# Patient Record
Sex: Female | Born: 1960 | Race: White | Hispanic: No | Smoking: Current every day smoker
Health system: Southern US, Community
[De-identification: ages and names within clinical notes are randomized; demographics above are authoritative.]

## PROBLEM LIST (undated history)

## (undated) DIAGNOSIS — I10 Essential (primary) hypertension: Secondary | ICD-10-CM

## (undated) DIAGNOSIS — E119 Type 2 diabetes mellitus without complications: Secondary | ICD-10-CM

## (undated) DIAGNOSIS — J45909 Unspecified asthma, uncomplicated: Secondary | ICD-10-CM

## (undated) DIAGNOSIS — F32A Depression, unspecified: Secondary | ICD-10-CM

## (undated) DIAGNOSIS — L280 Lichen simplex chronicus: Secondary | ICD-10-CM

## (undated) DIAGNOSIS — F1910 Other psychoactive substance abuse, uncomplicated: Secondary | ICD-10-CM

## (undated) DIAGNOSIS — E669 Obesity, unspecified: Secondary | ICD-10-CM

## (undated) DIAGNOSIS — K602 Anal fissure, unspecified: Secondary | ICD-10-CM

## (undated) DIAGNOSIS — F329 Major depressive disorder, single episode, unspecified: Secondary | ICD-10-CM

## (undated) HISTORY — DX: Major depressive disorder, single episode, unspecified: F32.9

## (undated) HISTORY — DX: Type 2 diabetes mellitus without complications: E11.9

## (undated) HISTORY — PX: TOTAL VAGINAL HYSTERECTOMY: SHX2548

## (undated) HISTORY — DX: Obesity, unspecified: E66.9

## (undated) HISTORY — DX: Depression, unspecified: F32.A

## (undated) HISTORY — DX: Essential (primary) hypertension: I10

## (undated) HISTORY — PX: MENISCUS REPAIR: SHX5179

## (undated) HISTORY — DX: Unspecified asthma, uncomplicated: J45.909

## (undated) HISTORY — DX: Anal fissure, unspecified: K60.2

## (undated) HISTORY — PX: CHOLECYSTECTOMY: SHX55

## (undated) HISTORY — PX: BACK SURGERY: SHX140

## (undated) HISTORY — DX: Other psychoactive substance abuse, uncomplicated: F19.10

## (undated) HISTORY — PX: TONSILLECTOMY: SUR1361

## (undated) HISTORY — DX: Lichen simplex chronicus: L28.0

---

## 1988-07-04 HISTORY — PX: CYSTOSTOMY W/ BLADDER BIOPSY: SHX1431

## 1998-02-12 ENCOUNTER — Other Ambulatory Visit: Admission: RE | Admit: 1998-02-12 | Discharge: 1998-02-12 | Payer: Self-pay | Admitting: Family Medicine

## 1999-08-11 ENCOUNTER — Other Ambulatory Visit: Admission: RE | Admit: 1999-08-11 | Discharge: 1999-08-11 | Payer: Self-pay | Admitting: Family Medicine

## 1999-09-25 ENCOUNTER — Emergency Department (HOSPITAL_COMMUNITY): Admission: EM | Admit: 1999-09-25 | Discharge: 1999-09-25 | Payer: Self-pay | Admitting: Emergency Medicine

## 1999-09-25 ENCOUNTER — Encounter: Payer: Self-pay | Admitting: Emergency Medicine

## 2000-07-17 ENCOUNTER — Other Ambulatory Visit (HOSPITAL_COMMUNITY): Admission: RE | Admit: 2000-07-17 | Discharge: 2000-08-07 | Payer: Self-pay | Admitting: Psychiatry

## 2002-12-11 ENCOUNTER — Encounter: Admission: RE | Admit: 2002-12-11 | Discharge: 2002-12-11 | Payer: Self-pay | Admitting: Neurology

## 2002-12-11 ENCOUNTER — Encounter: Payer: Self-pay | Admitting: Neurology

## 2003-01-30 ENCOUNTER — Encounter: Payer: Self-pay | Admitting: Family Medicine

## 2003-01-30 ENCOUNTER — Ambulatory Visit (HOSPITAL_COMMUNITY): Admission: RE | Admit: 2003-01-30 | Discharge: 2003-01-30 | Payer: Self-pay | Admitting: Family Medicine

## 2003-10-24 ENCOUNTER — Other Ambulatory Visit: Admission: RE | Admit: 2003-10-24 | Discharge: 2003-10-24 | Payer: Self-pay | Admitting: Family Medicine

## 2004-10-27 ENCOUNTER — Other Ambulatory Visit: Admission: RE | Admit: 2004-10-27 | Discharge: 2004-10-27 | Payer: Self-pay | Admitting: Family Medicine

## 2006-11-23 ENCOUNTER — Ambulatory Visit (HOSPITAL_COMMUNITY): Admission: RE | Admit: 2006-11-23 | Discharge: 2006-11-23 | Payer: Self-pay | Admitting: Family Medicine

## 2009-04-22 ENCOUNTER — Ambulatory Visit (HOSPITAL_COMMUNITY): Admission: RE | Admit: 2009-04-22 | Discharge: 2009-04-22 | Payer: Self-pay | Admitting: Family Medicine

## 2010-04-13 ENCOUNTER — Encounter: Admission: RE | Admit: 2010-04-13 | Discharge: 2010-04-13 | Payer: Self-pay | Admitting: Family Medicine

## 2010-10-28 ENCOUNTER — Other Ambulatory Visit: Payer: Self-pay | Admitting: Family Medicine

## 2010-10-28 DIAGNOSIS — K769 Liver disease, unspecified: Secondary | ICD-10-CM

## 2010-11-03 ENCOUNTER — Ambulatory Visit
Admission: RE | Admit: 2010-11-03 | Discharge: 2010-11-03 | Disposition: A | Discharge status: Home / Self Care | Payer: BC Managed Care – PPO | Source: Ambulatory Visit | Attending: Family Medicine | Admitting: Family Medicine

## 2010-11-03 DIAGNOSIS — K769 Liver disease, unspecified: Secondary | ICD-10-CM

## 2010-11-03 MED ORDER — IOHEXOL 300 MG/ML  SOLN: 125.0000 mL | Freq: Once | INTRAMUSCULAR | Status: AC | PRN

## 2012-04-17 ENCOUNTER — Other Ambulatory Visit (HOSPITAL_COMMUNITY): Payer: Self-pay | Admitting: Nurse Practitioner

## 2012-04-17 DIAGNOSIS — Z139 Encounter for screening, unspecified: Secondary | ICD-10-CM

## 2012-04-23 ENCOUNTER — Ambulatory Visit (HOSPITAL_COMMUNITY)
Admission: RE | Admit: 2012-04-23 | Discharge: 2012-04-23 | Disposition: A | Discharge status: Home / Self Care | Payer: Self-pay | Source: Ambulatory Visit | Attending: Nurse Practitioner | Admitting: Nurse Practitioner

## 2012-04-23 DIAGNOSIS — Z139 Encounter for screening, unspecified: Secondary | ICD-10-CM

## 2012-05-17 ENCOUNTER — Encounter (HOSPITAL_COMMUNITY): Payer: Self-pay | Admitting: Dietician

## 2012-05-17 NOTE — Progress Notes (Signed)
[facility] Diabetes Class Completion  Date:May 17, 2012  Time: 6:30 PM  Pt attended [facility]'s Diabetes Group Education Class on May 17, 2012.   Patient was educated on the following topics: survival skills (signs and symptoms of hyperglycemia and hypoglycemia, treatment for hypoglycemia, ideal levels for fasting and postprandial blood sugars, goal Hgb A1c level, foot care basics), recommendations for physical activity, carbohydrate metabolism in relation to diabetes, and meal planning (sources of carbohydrate, carbohydrate counting, meal planning strategies, food label reading, and portion control).   Rusty Villella A. Kayan, RD, LDN   

## 2012-10-18 ENCOUNTER — Ambulatory Visit (INDEPENDENT_AMBULATORY_CARE_PROVIDER_SITE_OTHER): Payer: BC Managed Care – PPO | Admitting: Family Medicine

## 2012-10-18 ENCOUNTER — Encounter: Payer: Self-pay | Admitting: Family Medicine

## 2012-10-18 VITALS — BP 125/87 | HR 97 | Temp 97.0°F | Ht 66.0 in | Wt 209.0 lb

## 2012-10-18 DIAGNOSIS — E785 Hyperlipidemia, unspecified: Secondary | ICD-10-CM

## 2012-10-18 DIAGNOSIS — I10 Essential (primary) hypertension: Secondary | ICD-10-CM

## 2012-10-18 DIAGNOSIS — E119 Type 2 diabetes mellitus without complications: Secondary | ICD-10-CM

## 2012-10-18 DIAGNOSIS — R1319 Other dysphagia: Secondary | ICD-10-CM

## 2012-10-18 LAB — POCT UA - MICROALBUMIN: Microalbumin Ur, POC: NEGATIVE mg/dL

## 2012-10-18 LAB — POCT GLYCOSYLATED HEMOGLOBIN (HGB A1C): Hemoglobin A1C: 5.7

## 2012-10-18 NOTE — Progress Notes (Signed)
Patient ID: Nancy Thornton, female   DOB: 1961/02/11, 52 y.o.   MRN: [id number] SUBJECTIVE: HPI: Patient is here for follow up of Diabetes Mellitus.Symptoms of DM:has had no Nocturia ,deniesUrinary Frequency ,denies Blurred vision ,deniesDizziness,denies.Dysuria,deniesparesthesias, deniesextremity pain or ulcers. Marland Kitchendenieschest pain. .has not hadan annual eye exam. do check the feet. doescheck CBGs. Average CBG:________.Marland Kitchen deniesto episodes of hypoglycemia. doeshave an emergency hypoglycemic plan. admits toCompliance with medications. deniesProblems with medications. Patient used to go to [facility] for care because of lack of insurance. Noew enrolled under the [employer] and has insurance she returns for ongoing care. Since her visit in 2012 she has aggressively dieted and lost over 60 lbs and helped improve her diet. She still smokes and  Hypertension: stable. See meds in EPIC.  Anther new problem is that whenever she eats or drinks liquids, she feels it flow to the right side of the neck before going down and it doesn't feel normal. No real pain. No night sweats.  PMH/PSH: reviewed/updated in Epic  SH/FH: reviewed/updated in Epic  Allergies: reviewed/updated in Epic  Medications: reviewed/updated in Epic  Immunizations: reviewed/updated in Epic  ROS: As above in the HPI. All other systems are stable or negative.  OBJECTIVE: APPEARANCE: WF OBese. Patient in no acute distress.The patient appeared well nourished and normally developed. Acyanotic. Waist: VITAL SIGNS:BP 125/87  Pulse 97  Temp(Src) 97 F (36.1 C) (Oral)  Ht 5\' 6"  (1.676 m)  Wt 209 lb (94.802 kg)  BMI 33.75 kg/m2   SKIN: warm and  Dry without overt rashes, tattoos and scars  HEAD and Neck: without JVD, Head and scalp: normal Eyes:No scleral icterus. Fundi normal, eye movements normal. Ears: Auricle normal, canal normal, Tympanic membranes normal, insufflation normal. Nose: normal Throat: normal Neck &  thyroid: normal  CHEST & LUNGS: Chest wall: normal Lungs: Clear  CVS: Reveals the PMI to be normally located. Regular rhythm, First and Second Heart sounds are normal,  absence of murmurs, rubs or gallops. Peripheral vasculature: Radial pulses: normal Dorsal pedis pulses: normal Posterior pulses: normal  ABDOMEN:  Appearance: normal Benign,, no organomegaly, no masses, no Abdominal Aortic enlargement. No Guarding , no rebound. No Bruits. Bowel sounds: normal  RECTAL: N/A GU: N/A  EXTREMETIES: nonedematous. Both Femoral and Pedal pulses are normal.  MUSCULOSKELETAL:  Spine: normal Joints: intact  NEUROLOGIC: oriented to time,place and person; nonfocal. Strength is normal Sensory is normal Reflexes are normal Cranial Nerves are normal.  ASSESSMENT: HTN (hypertension) - Plan: BASIC METABOLIC PANEL WITH GFR, NMR Lipoprofile with Lipids  Other dysphagia - Plan: Ambulatory referral to Gastroenterology  DM (diabetes mellitus) - Plan: Hepatic function panel, NMR Lipoprofile with Lipids, POCT glycosylated hemoglobin (Hb A1C), POCT UA - Microalbumin  HLD (hyperlipidemia) - Plan: Hepatic function panel, NMR Lipoprofile with Lipids    PLAN: Orders Placed This Encounter  Procedures  . BASIC METABOLIC PANEL WITH GFR  . Hepatic function panel  . NMR Lipoprofile with Lipids  . Ambulatory referral to Gastroenterology    Referral Priority:  Routine    Referral Type:  Consultation    Referral Reason:  Specialty Services Required    Requested Specialty:  Gastroenterology    Number of Visits Requested:  1  . POCT glycosylated hemoglobin (Hb A1C)  . POCT UA - Microalbumin   Results for orders placed in visit on 10/18/12 (from the past 24 hour(s))  POCT UA - MICROALBUMIN     Status: Normal   Collection Time    10/18/12  5:32 PM  Result Value Range   Microalbumin Ur, POC neg     Creatinine, POC       Albumin/Creatinine Ratio, Urine, POC      POCT GLYCOSYLATED  HEMOGLOBIN (HGB A1C)     Status: None   Collection Time    10/18/12  5:38 PM      Result Value Range   Hemoglobin A1C 5.7     Meds ordered this encounter  Medications  . lisinopril (PRINIVIL,ZESTRIL) 20 MG tablet    Sig: Take 20 mg by mouth daily.  Marland Kitchen atenolol-chlorthalidone (TENORETIC) 50-25 MG per tablet    Sig: Take 1 tablet by mouth daily.  . metFORMIN (GLUCOPHAGE) 500 MG tablet    Sig: Take 500 mg by mouth 2 (two) times daily with a meal.        Dr Woodroe Mode Recommendations  Diet and Exercise discussed with patient.  For nutrition information, I recommend books:  1).Eat to Live by Dr Monico Hoar. 2).Prevent and Reverse Heart Disease by Dr Suzzette Righter.  Exercise recommendations are:  If unable to walk, then the patient can exercise in a chair 3 times a day. By flapping arms like a bird gently and raising legs outwards to the front.  If ambulatory, the patient can go for walks for 30 minutes 3 times a week. Then increase the intensity and duration as tolerated.  Goal is to try to attain exercise frequency to 5 times a week.  If applicable: Best to perform resistance exercises (machines or weights) 2 days a week and cardio type exercises 3 days per week.  RTC in 3 months  [name] P. Modesto Charon, M.D.

## 2012-10-18 NOTE — Patient Instructions (Addendum)
      Dr Leondre Taul's Recommendations  Diet and Exercise discussed with patient.  For nutrition information, I recommend books:  1).Eat to Live by Dr Joel Fuhrman. 2).Prevent and Reverse Heart Disease by Dr Caldwell Esselstyn.  Exercise recommendations are:  If unable to walk, then the patient can exercise in a chair 3 times a day. By flapping arms like a bird gently and raising legs outwards to the front.  If ambulatory, the patient can go for walks for 30 minutes 3 times a week. Then increase the intensity and duration as tolerated.  Goal is to try to attain exercise frequency to 5 times a week.  If applicable: Best to perform resistance exercises (machines or weights) 2 days a week and cardio type exercises 3 days per week.  

## 2012-10-19 DIAGNOSIS — E1159 Type 2 diabetes mellitus with other circulatory complications: Secondary | ICD-10-CM | POA: Insufficient documentation

## 2012-10-19 DIAGNOSIS — E785 Hyperlipidemia, unspecified: Secondary | ICD-10-CM | POA: Insufficient documentation

## 2012-10-19 DIAGNOSIS — I152 Hypertension secondary to endocrine disorders: Secondary | ICD-10-CM | POA: Insufficient documentation

## 2012-10-19 DIAGNOSIS — E1165 Type 2 diabetes mellitus with hyperglycemia: Secondary | ICD-10-CM | POA: Insufficient documentation

## 2012-10-19 DIAGNOSIS — I10 Essential (primary) hypertension: Secondary | ICD-10-CM | POA: Insufficient documentation

## 2012-10-19 DIAGNOSIS — E119 Type 2 diabetes mellitus without complications: Secondary | ICD-10-CM

## 2012-10-19 DIAGNOSIS — E1169 Type 2 diabetes mellitus with other specified complication: Secondary | ICD-10-CM | POA: Insufficient documentation

## 2012-10-19 DIAGNOSIS — R1319 Other dysphagia: Secondary | ICD-10-CM | POA: Insufficient documentation

## 2012-10-19 HISTORY — DX: Type 2 diabetes mellitus without complications: E11.9

## 2012-10-19 LAB — HEPATIC FUNCTION PANEL
ALT: 21 U/L (ref 0–35)
AST: 23 U/L (ref 0–37)
Albumin: 5 g/dL (ref 3.5–5.2)
Alkaline Phosphatase: 59 U/L (ref 39–117)
Bilirubin, Direct: 0.2 mg/dL (ref 0.0–0.3)
Indirect Bilirubin: 0.8 mg/dL (ref 0.0–0.9)
Total Bilirubin: 1 mg/dL (ref 0.3–1.2)
Total Protein: 6.9 g/dL (ref 6.0–8.3)

## 2012-10-19 LAB — NMR LIPOPROFILE WITH LIPIDS
Cholesterol, Total: 181 mg/dL (ref <200)
HDL Particle Number: 37.6 umol/L (ref >=30.5)
HDL Size: 9.1 nm — ABNORMAL LOW (ref >=9.2)
HDL-C: 52 mg/dL (ref >=40)
LDL (calc): 84 mg/dL (ref <100)
LDL Particle Number: 1355 nmol/L — ABNORMAL HIGH (ref <1000)
LDL Size: 20.7 nm (ref >20.5)
LP-IR Score: 56 — ABNORMAL HIGH (ref <=45)
Large HDL-P: 7.5 umol/L (ref >=4.8)
Large VLDL-P: 8.8 nmol/L — ABNORMAL HIGH (ref <=2.7)
Small LDL Particle Number: 592 nmol/L — ABNORMAL HIGH (ref <=527)
Triglycerides: 226 mg/dL — ABNORMAL HIGH (ref <150)
VLDL Size: 49.5 nm — ABNORMAL HIGH (ref <=46.6)

## 2012-10-19 LAB — BASIC METABOLIC PANEL WITH GFR
BUN: 17 mg/dL (ref 6–23)
CO2: 33 mEq/L — ABNORMAL HIGH (ref 19–32)
Calcium: 10.7 mg/dL — ABNORMAL HIGH (ref 8.4–10.5)
Chloride: 98 mEq/L (ref 96–112)
Creat: 0.69 mg/dL (ref 0.50–1.10)
GFR, Est African American: >89 mL/min
GFR, Est Non African American: >89 mL/min
Glucose, Bld: 92 mg/dL (ref 70–99)
Potassium: 4.2 mEq/L (ref 3.5–5.3)
Sodium: 138 mEq/L (ref 135–145)

## 2012-10-19 NOTE — Progress Notes (Signed)
Quick Note:  Lab result at goal. No change in Medications for now. No Change in plans and follow up. Lipids not quite at goal. But continued Diet and Exercise and weight loss will improve the numbers. Keep the follow up. ______

## 2012-10-23 ENCOUNTER — Encounter: Payer: Self-pay | Admitting: Gastroenterology

## 2012-10-24 ENCOUNTER — Telehealth: Payer: Self-pay

## 2012-10-24 NOTE — Telephone Encounter (Signed)
Returning call.

## 2012-11-14 ENCOUNTER — Encounter: Payer: Self-pay | Admitting: Gastroenterology

## 2012-11-14 ENCOUNTER — Ambulatory Visit (INDEPENDENT_AMBULATORY_CARE_PROVIDER_SITE_OTHER): Payer: BC Managed Care – PPO | Admitting: Gastroenterology

## 2012-11-14 ENCOUNTER — Telehealth: Payer: Self-pay | Admitting: Gastroenterology

## 2012-11-14 VITALS — BP 100/70 | HR 48 | Ht 64.76 in | Wt 204.1 lb

## 2012-11-14 DIAGNOSIS — Z1211 Encounter for screening for malignant neoplasm of colon: Secondary | ICD-10-CM

## 2012-11-14 DIAGNOSIS — R1319 Other dysphagia: Secondary | ICD-10-CM

## 2012-11-14 MED ORDER — PEG-KCL-NACL-NASULF-NA ASC-C 100 G PO SOLR: 1.0000 | Freq: Once | ORAL | Status: DC

## 2012-11-14 NOTE — Progress Notes (Signed)
History of Present Illness: pleasant 52 year old white female referred at the request of Dr. Christell Constant for evaluation of swallowing difficulties and for screening colonoscopy. She has occasional dysphagia to solids. He denies odynophagia or pyrosis.  She also sometimes gets a choking sensation with swallowing. There is no history of abdominal pain, change of bowel habits, melena or hematochezia.  She has lost almost 60 pounds in the past year which she attributes to increased physical activity and some modification of her diet.    Past Medical History  Diagnosis Date  . Diabetes mellitus without complication   . Hypertension   . Anal fissure     history of  . Obesity    Past Surgical History  Procedure Laterality Date  . Cholecystectomy    . Abdominal hysterectomy    . Tonsillectomy    . Cystostomy w/ bladder biopsy  1990    punctured hole in bladder, had open procedure to repair   family history includes Diabetes in her mother and Hypertension in her father. Current Outpatient Prescriptions  Medication Sig Dispense Refill  . atenolol-chlorthalidone (TENORETIC) 50-25 MG per tablet Take 1 tablet by mouth daily.      Marland Kitchen lisinopril (PRINIVIL,ZESTRIL) 20 MG tablet Take 20 mg by mouth daily.      . metFORMIN (GLUCOPHAGE) 500 MG tablet Take 500 mg by mouth 2 (two) times daily with a meal.       No current facility-administered medications for this visit.   Allergies as of 11/14/2012 - Review Complete 11/14/2012  Allergen Reaction Noted  . Penicillins  10/18/2012    reports that she has been smoking Cigarettes.  She started smoking about 35 years ago. She has been smoking about 0.00 packs per day. She has never used smokeless tobacco. She reports that she does not drink alcohol or use illicit drugs.     Review of Systems: Pertinent positive and negative review of systems were noted in the above HPI section. All other review of systems were otherwise negative.  Vital signs were reviewed in  today's medical record Physical Exam: General: Well developed , well nourished, no acute distress Skin: anicteric Head: Normocephalic and atraumatic Eyes:  sclerae anicteric, EOMI Ears: Normal auditory acuity Mouth: No deformity or lesions Neck: Supple, no masses or thyromegaly Lungs: review inspiratory and expiratory wheezes Heart: Regular rate and rhythm; no murmurs, rubs or bruits Abdomen: Soft, non tender and non distended. No masses, hepatosplenomegaly or hernias noted. Normal Bowel sounds Rectal:deferred Musculoskeletal: Symmetrical with no gross deformities  Skin: No lesions on visible extremities Pulses:  Normal pulses noted Extremities: No clubbing, cyanosis, edema or deformities noted Neurological: Alert oriented x 4, grossly nonfocal Cervical Nodes:  No significant cervical adenopathy Inguinal Nodes: No significant inguinal adenopathy Psychological:  Alert and cooperative. Normal mood and affect

## 2012-11-14 NOTE — Assessment & Plan Note (Signed)
Dysphagia could be do to an early esophageal stricture  Recommendations #1 upper endoscopy with dilatation as indicated

## 2012-11-14 NOTE — Assessment & Plan Note (Signed)
Plan screening colonoscopy 

## 2012-11-14 NOTE — Telephone Encounter (Signed)
Called pt to pick up free coupon for the Moviprep

## 2012-11-14 NOTE — Patient Instructions (Addendum)
You have been scheduled for an endoscopy with propofol. Please follow written instructions given to you at your visit today. If you use inhalers (even only as needed), please bring them with you on the day of your procedure. Your physician has requested that you go to www.startemmi.com and enter the access code given to you at your visit today. This web site gives a general overview about your procedure. However, you should still follow specific instructions given to you by our office regarding your preparation for the procedure.  You have been scheduled for a colonoscopy with propofol. Please follow written instructions given to you at your visit today.  Please pick up your prep kit at the pharmacy within the next 1-3 days. If you use inhalers (even only as needed), please bring them with you on the day of your procedure. Your physician has requested that you go to www.startemmi.com and enter the access code given to you at your visit today. This web site gives a general overview about your procedure. However, you should still follow specific instructions given to you by our office regarding your preparation for the procedure. 

## 2012-11-19 ENCOUNTER — Other Ambulatory Visit: Payer: Self-pay | Admitting: Gastroenterology

## 2012-11-19 ENCOUNTER — Encounter: Payer: Self-pay | Admitting: Gastroenterology

## 2012-11-19 ENCOUNTER — Ambulatory Visit (AMBULATORY_SURGERY_CENTER): Payer: BC Managed Care – PPO | Admitting: Gastroenterology

## 2012-11-19 VITALS — BP 115/74 | HR 51 | Temp 96.4°F | Resp 20 | Ht 64.76 in | Wt 204.0 lb

## 2012-11-19 DIAGNOSIS — R1319 Other dysphagia: Secondary | ICD-10-CM

## 2012-11-19 DIAGNOSIS — K222 Esophageal obstruction: Secondary | ICD-10-CM

## 2012-11-19 LAB — GLUCOSE, CAPILLARY
Glucose-Capillary: 119 mg/dL — ABNORMAL HIGH (ref 70–99)
Glucose-Capillary: 126 mg/dL — ABNORMAL HIGH (ref 70–99)

## 2012-11-19 MED ORDER — SODIUM CHLORIDE 0.9 % IV SOLN: 500.0000 mL | INTRAVENOUS | Status: DC

## 2012-11-19 NOTE — Progress Notes (Signed)
Patient did not experience any of the following events: a burn prior to discharge; a fall within the facility; wrong site/side/patient/procedure/implant event; or a hospital transfer or hospital admission upon discharge from the facility. (G8907) Patient did not have preoperative order for IV antibiotic SSI prophylaxis. (G8918)  

## 2012-11-19 NOTE — Patient Instructions (Signed)
Dilatation of esophagus today, follow dilation diet. Nothing by mouth until 10:20 am, then clear liquids for 1 hour, then soft diet rest of today. Resume regular diet tomorrow. See handouts given on dilation diet and stricture. Repeat dilation as needed. Resume current medications. Call us with any questions or concerns. Thank you!!   YOU HAD AN ENDOSCOPIC PROCEDURE TODAY AT THE [facility]: Refer to the procedure report that was given to you for any specific questions about what was found during the examination.  If the procedure report does not answer your questions, please call your gastroenterologist to clarify.  If you requested that your care partner not be given the details of your procedure findings, then the procedure report has been included in a sealed envelope for you to review at your convenience later.  YOU SHOULD EXPECT: Some feelings of bloating in the abdomen. Passage of more gas than usual.  Walking can help get rid of the air that was put into your GI tract during the procedure and reduce the bloating. If you had a lower endoscopy (such as a colonoscopy or flexible sigmoidoscopy) you may notice spotting of blood in your stool or on the toilet paper. If you underwent a bowel prep for your procedure, then you may not have a normal bowel movement for a few days.  DIET: dilation diet today, see handout.  Drink plenty of fluids but you should avoid alcoholic beverages for 24 hours.  ACTIVITY: Your care partner should take you home directly after the procedure.  You should plan to take it easy, moving slowly for the rest of the day.  You can resume normal activity the day after the procedure however you should NOT DRIVE or use heavy machinery for 24 hours (because of the sedation medicines used during the test).    SYMPTOMS TO REPORT IMMEDIATELY: A gastroenterologist can be reached at any hour.  During normal business hours, 8:30 AM to 5:00 PM Monday through Friday, call [phone]  [phone].  After hours and on weekends, please call the GI answering service at [phone] who will take a message and have the physician on call contact you.   Following lower endoscopy (colonoscopy or flexible sigmoidoscopy):  Excessive amounts of blood in the stool  Significant tenderness or worsening of abdominal pains  Swelling of the abdomen that is new, acute  Fever of 100F or higher  Following upper endoscopy (EGD)  Vomiting of blood or coffee ground material  New chest pain or pain under the shoulder blades  Painful or persistently difficult swallowing  New shortness of breath  Fever of 100F or higher  Black, tarry-looking stools  FOLLOW UP: If any biopsies were taken you will be contacted by phone or by letter within the next 1-3 weeks.  Call your gastroenterologist if you have not heard about the biopsies in 3 weeks.  Our staff will call the home number listed on your records the next business day following your procedure to check on you and address any questions or concerns that you may have at that time regarding the information given to you following your procedure. This is a courtesy call and so if there is no answer at the home number and we have not heard from you through the emergency physician on call, we will assume that you have returned to your regular daily activities without incident.  SIGNATURES/CONFIDENTIALITY: You and/or your care partner have signed paperwork which will be entered into your electronic medical record.  These  signatures attest to the fact that that the information above on your After Visit Summary has been reviewed and is understood.  Full responsibility of the confidentiality of this discharge information lies with you and/or your care-partner. 

## 2012-11-19 NOTE — Progress Notes (Signed)
Stable to RR 

## 2012-11-19 NOTE — Progress Notes (Signed)
Called to room to assist during endoscopic procedure.  Patient ID and intended procedure confirmed with present staff. Received instructions for my participation in the procedure from the performing physician.  

## 2012-11-19 NOTE — Op Note (Signed)
[facility] [address] [place], [address]   ENDOSCOPY PROCEDURE REPORT  PATIENT: Nancy Thornton, Nancy Thornton  MR#: [id number] BIRTHDATE: 1960-10-20 , 51  yrs. old GENDER: Female ENDOSCOPIST: Louis Meckel, MD REFERRED BY:  Rudi Heap, M.D. PROCEDURE DATE:  11/19/2012 PROCEDURE:  EGD, diagnostic and Maloney dilation of esophagus ASA CLASS:     Class II INDICATIONS:  Dysphagia. MEDICATIONS: MAC sedation, administered by CRNA, propofol (Diprivan) 100mg  IV, and Simethicone 0.6cc PO TOPICAL ANESTHETIC:  DESCRIPTION OF PROCEDURE: After the risks benefits and alternatives of the procedure were thoroughly explained, informed consent was obtained.  The LB [id number] endoscope was introduced through the mouth and advanced to the third portion of the duodenum. Without limitations.  The instrument was slowly withdrawn as the mucosa was fully examined.      There was a moderate stricture at the GE junction.  The 9.8 mm gastroscope easily traversed the stricture.  A small hiatal hernia was present.   The remainder of the upper endoscopy exam was otherwise normal.  Retroflexed views revealed no abnormalities. The scope was then withdrawn from the patient.  A #52 Jerene Dilling dilator was passed with moderate resistance. There was no heme.  COMPLICATIONS: There were no complications. ENDOSCOPIC IMPRESSION: 1.   esophageal stricture-status post [facility] dilation  RECOMMENDATIONS: repeat dilatation as needed REPEAT EXAM:  eSigned:  Louis Meckel, MD 11/19/2012 9:21 AM   CC:

## 2012-11-20 ENCOUNTER — Telehealth: Payer: Self-pay

## 2012-11-20 NOTE — Telephone Encounter (Signed)
Left message for follow up call. 

## 2012-11-21 ENCOUNTER — Other Ambulatory Visit: Payer: Self-pay | Admitting: *Deleted

## 2012-11-21 MED ORDER — METFORMIN HCL 500 MG PO TABS: 500.0000 mg | ORAL_TABLET | Freq: Two times a day (BID) | ORAL | Status: DC

## 2012-12-04 ENCOUNTER — Other Ambulatory Visit: Payer: Self-pay

## 2012-12-04 MED ORDER — ATENOLOL-CHLORTHALIDONE 50-25 MG PO TABS: 1.0000 | ORAL_TABLET | Freq: Every day | ORAL | Status: DC

## 2012-12-04 MED ORDER — LISINOPRIL 20 MG PO TABS: 20.0000 mg | ORAL_TABLET | Freq: Every day | ORAL | Status: DC

## 2012-12-06 ENCOUNTER — Encounter: Payer: Self-pay | Admitting: Gastroenterology

## 2012-12-06 ENCOUNTER — Ambulatory Visit (AMBULATORY_SURGERY_CENTER): Payer: BC Managed Care – PPO | Admitting: Gastroenterology

## 2012-12-06 ENCOUNTER — Other Ambulatory Visit: Payer: Self-pay | Admitting: Gastroenterology

## 2012-12-06 VITALS — BP 129/73 | HR 53 | Temp 98.1°F | Resp 18 | Ht 64.0 in | Wt 204.0 lb

## 2012-12-06 DIAGNOSIS — Z1211 Encounter for screening for malignant neoplasm of colon: Secondary | ICD-10-CM

## 2012-12-06 LAB — GLUCOSE, CAPILLARY
Glucose-Capillary: 100 mg/dL — ABNORMAL HIGH (ref 70–99)
Glucose-Capillary: 107 mg/dL — ABNORMAL HIGH (ref 70–99)

## 2012-12-06 MED ORDER — SODIUM CHLORIDE 0.9 % IV SOLN: 500.0000 mL | INTRAVENOUS | Status: DC

## 2012-12-06 NOTE — Patient Instructions (Addendum)
Discharge instructions given with verbal understanding. Normal exam. Resume previous medications. YOU HAD AN ENDOSCOPIC PROCEDURE TODAY AT THE [facility]: Refer to the procedure report that was given to you for any specific questions about what was found during the examination.  If the procedure report does not answer your questions, please call your gastroenterologist to clarify.  If you requested that your care partner not be given the details of your procedure findings, then the procedure report has been included in a sealed envelope for you to review at your convenience later.  YOU SHOULD EXPECT: Some feelings of bloating in the abdomen. Passage of more gas than usual.  Walking can help get rid of the air that was put into your GI tract during the procedure and reduce the bloating. If you had a lower endoscopy (such as a colonoscopy or flexible sigmoidoscopy) you may notice spotting of blood in your stool or on the toilet paper. If you underwent a bowel prep for your procedure, then you may not have a normal bowel movement for a few days.  DIET: Your first meal following the procedure should be a light meal and then it is ok to progress to your normal diet.  A half-sandwich or bowl of soup is an example of a good first meal.  Heavy or fried foods are harder to digest and may make you feel nauseous or bloated.  Likewise meals heavy in dairy and vegetables can cause extra gas to form and this can also increase the bloating.  Drink plenty of fluids but you should avoid alcoholic beverages for 24 hours.  ACTIVITY: Your care partner should take you home directly after the procedure.  You should plan to take it easy, moving slowly for the rest of the day.  You can resume normal activity the day after the procedure however you should NOT DRIVE or use heavy machinery for 24 hours (because of the sedation medicines used during the test).    SYMPTOMS TO REPORT IMMEDIATELY: A gastroenterologist  can be reached at any hour.  During normal business hours, 8:30 AM to 5:00 PM Monday through Friday, call [phone].  After hours and on weekends, please call the GI answering service at [phone] who will take a message and have the physician on call contact you.   Following lower endoscopy (colonoscopy or flexible sigmoidoscopy):  Excessive amounts of blood in the stool  Significant tenderness or worsening of abdominal pains  Swelling of the abdomen that is new, acute  Fever of 100F or higher  FOLLOW UP: If any biopsies were taken you will be contacted by phone or by letter within the next 1-3 weeks.  Call your gastroenterologist if you have not heard about the biopsies in 3 weeks.  Our staff will call the home number listed on your records the next business day following your procedure to check on you and address any questions or concerns that you may have at that time regarding the information given to you following your procedure. This is a courtesy call and so if there is no answer at the home number and we have not heard from you through the emergency physician on call, we will assume that you have returned to your regular daily activities without incident.  SIGNATURES/CONFIDENTIALITY: You and/or your care partner have signed paperwork which will be entered into your electronic medical record.  These signatures attest to the fact that that the information above on your After Visit Summary has been reviewed   and is understood.  Full responsibility of the confidentiality of this discharge information lies with you and/or your care-partner. 

## 2012-12-06 NOTE — Progress Notes (Signed)
Patient did not experience any of the following events: a burn prior to discharge; a fall within the facility; wrong site/side/patient/procedure/implant event; or a hospital transfer or hospital admission upon discharge from the facility. (G8907) Patient did not have preoperative order for IV antibiotic SSI prophylaxis. (G8918)  

## 2012-12-06 NOTE — Progress Notes (Signed)
Lidocaine-40mg IV prior to Propofol InductionPropofol given over incremental dosages 

## 2012-12-06 NOTE — Op Note (Signed)
[facility] [address] [place], [address]   COLONOSCOPY PROCEDURE REPORT  PATIENT: Nancy Thornton, Nancy Thornton  MR#: [id number] BIRTHDATE: [birth year] , 51  yrs. old GENDER: Female ENDOSCOPIST: Louis Meckel, MD REFERRED BY: PROCEDURE DATE:  12/06/2012 PROCEDURE:   Colonoscopy, diagnostic ASA CLASS:   Class II INDICATIONS:average risk screening. MEDICATIONS: MAC sedation, administered by CRNA and propofol (Diprivan) 200mg  IV  DESCRIPTION OF PROCEDURE:   After the risks benefits and alternatives of the procedure were thoroughly explained, informed consent was obtained.  A digital rectal exam revealed no abnormalities of the rectum.   The LB [id number]  endoscope was introduced through the anus and advanced to the cecum, which was identified by both the appendix and ileocecal valve. No adverse events experienced.   The quality of the prep was excellent using Suprep  The instrument was then slowly withdrawn as the colon was fully examined.      COLON FINDINGS: A normal appearing cecum, ileocecal valve, and appendiceal orifice were identified.  The ascending, hepatic flexure, transverse, splenic flexure, descending, sigmoid colon and rectum appeared unremarkable.  No polyps or cancers were seen. Retroflexed views revealed no abnormalities. The time to cecum=2 minutes 11 seconds.  Withdrawal time=6 minutes 0 seconds.  The scope was withdrawn and the procedure completed. COMPLICATIONS: There were no complications.  ENDOSCOPIC IMPRESSION: Normal colon  RECOMMENDATIONS: Continue current colorectal screening recommendations for "routine risk" patients with a repeat colonoscopy in 10 years.   eSigned:  Louis Meckel, MD 12/06/2012 9:24 AM   cc:

## 2012-12-07 ENCOUNTER — Telehealth: Payer: Self-pay

## 2012-12-07 NOTE — Telephone Encounter (Signed)
No answer, left message to call [facility] if any problems post procedure 

## 2012-12-08 ENCOUNTER — Other Ambulatory Visit: Payer: Self-pay | Admitting: Family Medicine

## 2013-01-17 ENCOUNTER — Ambulatory Visit (INDEPENDENT_AMBULATORY_CARE_PROVIDER_SITE_OTHER): Payer: BC Managed Care – PPO | Admitting: Family Medicine

## 2013-01-17 ENCOUNTER — Encounter: Payer: Self-pay | Admitting: Family Medicine

## 2013-01-17 VITALS — BP 133/86 | HR 50 | Temp 98.0°F | Ht 65.5 in | Wt 207.8 lb

## 2013-01-17 DIAGNOSIS — E119 Type 2 diabetes mellitus without complications: Secondary | ICD-10-CM

## 2013-01-17 DIAGNOSIS — E785 Hyperlipidemia, unspecified: Secondary | ICD-10-CM

## 2013-01-17 DIAGNOSIS — I10 Essential (primary) hypertension: Secondary | ICD-10-CM

## 2013-01-17 DIAGNOSIS — M109 Gout, unspecified: Secondary | ICD-10-CM

## 2013-01-17 LAB — COMPLETE METABOLIC PANEL WITH GFR
ALT: 21 U/L (ref 0–35)
AST: 23 U/L (ref 0–37)
Albumin: 4.5 g/dL (ref 3.5–5.2)
Alkaline Phosphatase: 63 U/L (ref 39–117)
BUN: 15 mg/dL (ref 6–23)
CO2: 34 mEq/L — ABNORMAL HIGH (ref 19–32)
Calcium: 10.1 mg/dL (ref 8.4–10.5)
Chloride: 99 mEq/L (ref 96–112)
Creat: 0.59 mg/dL (ref 0.50–1.10)
GFR, Est African American: >89 mL/min
GFR, Est Non African American: >89 mL/min
Glucose, Bld: 99 mg/dL (ref 70–99)
Potassium: 4.2 mEq/L (ref 3.5–5.3)
Sodium: 141 mEq/L (ref 135–145)
Total Bilirubin: 1 mg/dL (ref 0.3–1.2)
Total Protein: 6.4 g/dL (ref 6.0–8.3)

## 2013-01-17 LAB — URIC ACID: Uric Acid, Serum: 8 mg/dL — ABNORMAL HIGH (ref 2.4–6.0)

## 2013-01-17 LAB — POCT GLYCOSYLATED HEMOGLOBIN (HGB A1C): Hemoglobin A1C: 5.6

## 2013-01-17 MED ORDER — LISINOPRIL 20 MG PO TABS: 20.0000 mg | ORAL_TABLET | Freq: Every day | ORAL | Status: DC

## 2013-01-17 MED ORDER — ATENOLOL-CHLORTHALIDONE 50-25 MG PO TABS: 1.0000 | ORAL_TABLET | Freq: Every day | ORAL | Status: DC

## 2013-01-17 MED ORDER — METFORMIN HCL 500 MG PO TABS: 500.0000 mg | ORAL_TABLET | Freq: Two times a day (BID) | ORAL | Status: DC

## 2013-01-17 NOTE — Patient Instructions (Addendum)
Dr Woodroe Mode Recommendations  Diet and Exercise discussed with patient.  For nutrition information, I recommend books:  1).Eat to Live by Dr Monico Hoar. 2).Prevent and Reverse Heart Disease by Dr Suzzette Righter. 3) Dr Katherina Right Book:  Program to Reverse Diabetes  Exercise recommendations are:  If unable to walk, then the patient can exercise in a chair 3 times a day. By flapping arms like a bird gently and raising legs outwards to the front.  If ambulatory, the patient can go for walks for 30 minutes 3 times a week. Then increase the intensity and duration as tolerated.  Goal is to try to attain exercise frequency to 5 times a week.  If applicable: Best to perform resistance exercises (machines or weights) 2 days a week and cardio type exercises 3 days per week.   Smoking Cessation Quitting smoking is important to your health and has many advantages. However, it is not always easy to quit since nicotine is a very addictive drug. Often times, people try 3 times or more before being able to quit. This document explains the best ways for you to prepare to quit smoking. Quitting takes hard work and a lot of effort, but you can do it. ADVANTAGES OF QUITTING SMOKING  You will live longer, feel better, and live better.  Your body will feel the impact of quitting smoking almost immediately.  Within 20 minutes, blood pressure decreases. Your pulse returns to its normal level.  After 8 hours, carbon monoxide levels in the blood return to normal. Your oxygen level increases.  After 24 hours, the chance of having a heart attack starts to decrease. Your breath, hair, and body stop smelling like smoke.  After 48 hours, damaged nerve endings begin to recover. Your sense of taste and smell improve.  After 72 hours, the body is virtually free of nicotine. Your bronchial tubes relax and breathing becomes easier.  After 2 to 12 weeks, lungs can hold more air. Exercise becomes  easier and circulation improves.  The risk of having a heart attack, stroke, cancer, or lung disease is greatly reduced.  After 1 year, the risk of coronary heart disease is cut in half.  After 5 years, the risk of stroke falls to the same as a nonsmoker.  After 10 years, the risk of lung cancer is cut in half and the risk of other cancers decreases significantly.  After 15 years, the risk of coronary heart disease drops, usually to the level of a nonsmoker.  If you are pregnant, quitting smoking will improve your chances of having a healthy baby.  The people you live with, especially any children, will be healthier.  You will have extra money to spend on things other than cigarettes. QUESTIONS TO THINK ABOUT BEFORE ATTEMPTING TO QUIT You may want to talk about your answers with your caregiver.  Why do you want to quit?  If you tried to quit in the past, what helped and what did not?  What will be the most difficult situations for you after you quit? How will you plan to handle them?  Who can help you through the tough times? Your family? Friends? A caregiver?  What pleasures do you get from smoking? What ways can you still get pleasure if you quit? Here are some questions to ask your caregiver:  How can you help me to be successful at quitting?  What medicine do you think would be best for me and how should  I take it?  What should I do if I need more help?  What is smoking withdrawal like? How can I get information on withdrawal? GET READY  Set a quit date.  Change your environment by getting rid of all cigarettes, ashtrays, matches, and lighters in your home, car, or work. Do not let people smoke in your home.  Review your past attempts to quit. Think about what worked and what did not. GET SUPPORT AND ENCOURAGEMENT You have a better chance of being successful if you have help. You can get support in many ways.  Tell your family, friends, and co-workers that you are  going to quit and need their support. Ask them not to smoke around you.  Get individual, group, or telephone counseling and support. Programs are available at [employer] and health centers. Call your local health department for information about programs in your area.  Spiritual beliefs and practices may help some smokers quit.  Download a "quit meter" on your computer to keep track of quit statistics, such as how long you have gone without smoking, cigarettes not smoked, and money saved.  Get a self-help book about quitting smoking and staying off of tobacco. LEARN NEW SKILLS AND BEHAVIORS  Distract yourself from urges to smoke. Talk to someone, go for a walk, or occupy your time with a task.  Change your normal routine. Take a different route to work. Drink tea instead of coffee. Eat breakfast in a different place.  Reduce your stress. Take a hot bath, exercise, or read a book.  Plan something enjoyable to do every day. Reward yourself for not smoking.  Explore interactive web-based programs that specialize in helping you quit. GET MEDICINE AND USE IT CORRECTLY Medicines can help you stop smoking and decrease the urge to smoke. Combining medicine with the above behavioral methods and support can greatly increase your chances of successfully quitting smoking.  Nicotine replacement therapy helps deliver nicotine to your body without the negative effects and risks of smoking. Nicotine replacement therapy includes nicotine gum, lozenges, inhalers, nasal sprays, and skin patches. Some may be available over-the-counter and others require a prescription.  Antidepressant medicine helps people abstain from smoking, but how this works is unknown. This medicine is available by prescription.  Nicotinic receptor partial agonist medicine simulates the effect of nicotine in your brain. This medicine is available by prescription. Ask your caregiver for advice about which medicines to use and how  to use them based on your health history. Your caregiver will tell you what side effects to look out for if you choose to be on a medicine or therapy. Carefully read the information on the package. Do not use any other product containing nicotine while using a nicotine replacement product.  RELAPSE OR DIFFICULT SITUATIONS Most relapses occur within the first 3 months after quitting. Do not be discouraged if you start smoking again. Remember, most people try several times before finally quitting. You may have symptoms of withdrawal because your body is used to nicotine. You may crave cigarettes, be irritable, feel very hungry, cough often, get headaches, or have difficulty concentrating. The withdrawal symptoms are only temporary. They are strongest when you first quit, but they will go away within 10 14 days. To reduce the chances of relapse, try to:  Avoid drinking alcohol. Drinking lowers your chances of successfully quitting.  Reduce the amount of caffeine you consume. Once you quit smoking, the amount of caffeine in your body increases and can give you  symptoms, such as a rapid heartbeat, sweating, and anxiety.  Avoid smokers because they can make you want to smoke.  Do not let weight gain distract you. Many smokers will gain weight when they quit, usually less than 10 pounds. Eat a healthy diet and stay active. You can always lose the weight gained after you quit.  Find ways to improve your mood other than smoking. FOR MORE INFORMATION  www.smokefree.gov  Document Released: 06/14/2001 Document Revised: 12/20/2011 Document Reviewed: 09/29/2011 [facility] Patient Information 2014 [place], [place].

## 2013-01-17 NOTE — Progress Notes (Signed)
Patient ID: Nancy Thornton, female   DOB: 01-28-1961, 52 y.o.   MRN: [id number] SUBJECTIVE: CC: Chief Complaint  Patient presents with  . Follow-up    non fasting would like uric acid and hg b a1c ck'd.  . Medication Refill    needs refills     HPI: Patient is here for follow up of Diabetes Mellitus: Symptoms of DM: Denies Nocturia ,Denies Urinary Frequency , denies Blurred vision ,deniesDizziness,denies.Dysuria,denies paresthesias, denies extremity pain or ulcers.[name] chest pain. has had an annual eye exam. do check the feet. Does check CBGs. Average WUX:LKGM not test. Doesn't like to test Denies episodes of hypoglycemia. Does have an emergency hypoglycemic plan. admits toCompliance with medications. Denies Problems with medications.  Breakfast: bagel egg and cheese Snicker bars Lunch: couple hot dogs, dried fruit bag Supper : thin steaks.  Past Medical History  Diagnosis Date  . Diabetes mellitus without complication   . Hypertension   . Anal fissure     history of  . Obesity   . Asthma   . Depression   . 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 Cholecystectomy    . Abdominal hysterectomy    . Tonsillectomy    . Cystostomy w/ bladder biopsy  1990    punctured hole in bladder, had open procedure to repair   History   Social History  . Marital Status: Divorced    Spouse Name: N/A    Number of Children: 1  . Years of Education: N/A   Occupational History  . manufacturing    Social History Main Topics  . Smoking status: Current Every Day Smoker -- 0.50 packs/day    Types: Cigarettes    Start date: 10/18/1977  . Smokeless tobacco: Never Used  . Alcohol Use: No  . Drug Use: No  . Sexually Active: Not on file   Other Topics Concern  . Not on file   Social History Narrative  . No narrative on file   Family History  Problem Relation Age of Onset  . Diabetes Mother   .  Hypertension Father   . Colon cancer Neg Hx   . Esophageal cancer Neg Hx   . Rectal cancer Neg Hx   . Stomach cancer Neg Hx    Current Outpatient Prescriptions on File Prior to Visit  Medication Sig Dispense Refill  . atenolol-chlorthalidone (TENORETIC) 50-25 MG per tablet Take 1 tablet by mouth daily.  30 tablet  3  . Cyanocobalamin (VITAMIN B 12 PO) Take by mouth.      Marland Kitchen lisinopril (PRINIVIL,ZESTRIL) 20 MG tablet Take 1 tablet (20 mg total) by mouth daily.  30 tablet  3  . metFORMIN (GLUCOPHAGE) 500 MG tablet Take 1 tablet (500 mg total) by mouth 2 (two) times daily with a meal.  60 tablet  1  . Omega-3 Fatty Acids (FISH OIL PO) Take by mouth.       No current facility-administered medications on file prior to visit.   Allergies  Allergen Reactions  . Penicillins Hives    There is no immunization history on file for this patient. Prior to Admission medications   Medication Sig Start Date End Date Taking? Authorizing Provider  atenolol-chlorthalidone (TENORETIC) 50-25 MG per tablet Take 1 tablet by mouth daily. 12/04/12  Yes Ileana Ladd, MD  Cyanocobalamin (VITAMIN B 12 PO) Take by mouth.   Yes Historical Provider, MD  lisinopril (PRINIVIL,ZESTRIL)  20 MG tablet Take 1 tablet (20 mg total) by mouth daily. 12/04/12  Yes Ileana Ladd, MD  metFORMIN (GLUCOPHAGE) 500 MG tablet Take 1 tablet (500 mg total) by mouth 2 (two) times daily with a meal. 11/21/12  Yes Ileana Ladd, MD  Omega-3 Fatty Acids (FISH OIL PO) Take by mouth.   Yes Historical Provider, MD    ROS: As above in the HPI. All other systems are stable or negative.  OBJECTIVE: APPEARANCE:  Patient in no acute distress.The patient appeared well nourished and normally developed. Acyanotic. Waist:41 inches VITAL SIGNS:BP 133/86  Pulse 50  Temp(Src) 98 F (36.7 C) (Oral)  Ht 5' 5.5" (1.664 m)  Wt 207 lb 12.8 oz (94.257 kg)  BMI 34.04 kg/m2  WF  SKIN: warm and  Dry without overt rashes, tattoos and scars  HEAD  and Neck: without JVD, Head and scalp: normal Eyes:No scleral icterus. Fundi normal, eye movements normal. Ears: Auricle normal, canal normal, Tympanic membranes normal, insufflation normal. Nose: normal Throat: normal Neck & thyroid: normal  CHEST & LUNGS: Chest wall: normal Lungs: Clear  CVS: Reveals the PMI to be normally located. Regular rhythm, First and Second Heart sounds are normal,  absence of murmurs, rubs or gallops. Peripheral vasculature: Radial pulses: normal Dorsal pedis pulses: normal Posterior pulses: normal  ABDOMEN:  Appearance: obese Benign, no organomegaly, no masses, no Abdominal Aortic enlargement. No Guarding , no rebound. No Bruits. Bowel sounds: normal  RECTAL: N/A GU: N/A  EXTREMETIES: nonedematous. Both Femoral and Pedal pulses are normal.  MUSCULOSKELETAL:  Spine: normal Joints: intact  NEUROLOGIC: oriented to time,place and person; nonfocal. Strength is normal Sensory is normal Reflexes are normal Cranial Nerves are normal.  ASSESSMENT: HTN (hypertension) - Plan: COMPLETE METABOLIC PANEL WITH GFR, atenolol-chlorthalidone (TENORETIC) 50-25 MG per tablet, lisinopril (PRINIVIL,ZESTRIL) 20 MG tablet  HLD (hyperlipidemia) - Plan: COMPLETE METABOLIC PANEL WITH GFR, NMR Lipoprofile with Lipids  DM (diabetes mellitus) - Plan: POCT glycosylated hemoglobin (Hb A1C), COMPLETE METABOLIC PANEL WITH GFR, metFORMIN (GLUCOPHAGE) 500 MG tablet, CANCELED: POCT UA - Microalbumin  Gout - Plan: Uric acid  PLAN:      Dr Woodroe Mode Recommendations  Diet and Exercise discussed with patient.  For nutrition information, I recommend books:  1).Eat to Live by Dr Monico Hoar. 2).Prevent and Reverse Heart Disease by Dr Suzzette Righter. 3) Dr Katherina Right Book:  Program to Reverse Diabetes  Exercise recommendations are:  If unable to walk, then the patient can exercise in a chair 3 times a day. By flapping arms like a bird gently and raising  legs outwards to the front.  If ambulatory, the patient can go for walks for 30 minutes 3 times a week. Then increase the intensity and duration as tolerated.  Goal is to try to attain exercise frequency to 5 times a week.  If applicable: Best to perform resistance exercises (machines or weights) 2 days a week and cardio type exercises 3 days per week.  Smoking cessation discussed and counseled and handout in the AVS  Orders Placed This Encounter  Procedures  . Uric acid  . COMPLETE METABOLIC PANEL WITH GFR  . NMR Lipoprofile with Lipids  . POCT glycosylated hemoglobin (Hb A1C)   Meds ordered this encounter  Medications  . atenolol-chlorthalidone (TENORETIC) 50-25 MG per tablet    Sig: Take 1 tablet by mouth daily.    Dispense:  30 tablet    Refill:  5  . lisinopril (PRINIVIL,ZESTRIL) 20 MG tablet  Sig: Take 1 tablet (20 mg total) by mouth daily.    Dispense:  30 tablet    Refill:  5  . metFORMIN (GLUCOPHAGE) 500 MG tablet    Sig: Take 1 tablet (500 mg total) by mouth 2 (two) times daily with a meal.    Dispense:  60 tablet    Refill:  5   Return in about 3 months (around 04/19/2013) for Recheck medical problems.  Lydia Meng P. [place] Charon, M.D.

## 2013-01-18 LAB — NMR LIPOPROFILE WITH LIPIDS
Cholesterol, Total: 196 mg/dL (ref <200)
HDL Particle Number: 34.9 umol/L (ref >=30.5)
HDL Size: 9.2 nm (ref >=9.2)
HDL-C: 57 mg/dL (ref >=40)
LDL (calc): 104 mg/dL — ABNORMAL HIGH (ref <100)
LDL Particle Number: 1452 nmol/L — ABNORMAL HIGH (ref <1000)
LDL Size: 21 nm (ref >20.5)
LP-IR Score: 47 — ABNORMAL HIGH (ref <=45)
Large HDL-P: 9.7 umol/L (ref >=4.8)
Large VLDL-P: 6.1 nmol/L — ABNORMAL HIGH (ref <=2.7)
Small LDL Particle Number: 605 nmol/L — ABNORMAL HIGH (ref <=527)
Triglycerides: 176 mg/dL — ABNORMAL HIGH (ref <150)
VLDL Size: 48.5 nm — ABNORMAL HIGH (ref <=46.6)

## 2013-01-19 ENCOUNTER — Other Ambulatory Visit: Payer: Self-pay | Admitting: Family Medicine

## 2013-01-19 DIAGNOSIS — M109 Gout, unspecified: Secondary | ICD-10-CM

## 2013-01-19 DIAGNOSIS — E785 Hyperlipidemia, unspecified: Secondary | ICD-10-CM

## 2013-01-19 MED ORDER — PRAVASTATIN SODIUM 20 MG PO TABS: 20.0000 mg | ORAL_TABLET | Freq: Every day | ORAL | Status: DC

## 2013-01-19 MED ORDER — ALLOPURINOL 300 MG PO TABS
300.0000 mg | ORAL_TABLET | Freq: Every day | ORAL | Status: DC
Start: 2013-01-19 — End: 2013-04-18

## 2013-01-19 NOTE — Progress Notes (Signed)
Quick Note:  Labs abnormal. Uric acid is elevated and the lipids is not at goal. We should use a low dose statin and a uric acid reducing drug to avoid gout.ordered in [facility]IC. Keep follow up ______

## 2013-02-04 ENCOUNTER — Encounter: Payer: Self-pay | Admitting: *Deleted

## 2013-04-18 ENCOUNTER — Encounter: Payer: Self-pay | Admitting: Family Medicine

## 2013-04-18 ENCOUNTER — Ambulatory Visit (INDEPENDENT_AMBULATORY_CARE_PROVIDER_SITE_OTHER): Payer: BC Managed Care – PPO | Admitting: Family Medicine

## 2013-04-18 VITALS — BP 129/80 | HR 67 | Temp 97.0°F | Wt 212.0 lb

## 2013-04-18 DIAGNOSIS — M109 Gout, unspecified: Secondary | ICD-10-CM | POA: Insufficient documentation

## 2013-04-18 DIAGNOSIS — R1319 Other dysphagia: Secondary | ICD-10-CM

## 2013-04-18 DIAGNOSIS — E1142 Type 2 diabetes mellitus with diabetic polyneuropathy: Secondary | ICD-10-CM

## 2013-04-18 DIAGNOSIS — E119 Type 2 diabetes mellitus without complications: Secondary | ICD-10-CM

## 2013-04-18 DIAGNOSIS — E785 Hyperlipidemia, unspecified: Secondary | ICD-10-CM

## 2013-04-18 DIAGNOSIS — I10 Essential (primary) hypertension: Secondary | ICD-10-CM

## 2013-04-18 DIAGNOSIS — Z23 Encounter for immunization: Secondary | ICD-10-CM | POA: Insufficient documentation

## 2013-04-18 DIAGNOSIS — E1140 Type 2 diabetes mellitus with diabetic neuropathy, unspecified: Secondary | ICD-10-CM

## 2013-04-18 DIAGNOSIS — Z1211 Encounter for screening for malignant neoplasm of colon: Secondary | ICD-10-CM

## 2013-04-18 DIAGNOSIS — E1149 Type 2 diabetes mellitus with other diabetic neurological complication: Secondary | ICD-10-CM

## 2013-04-18 LAB — POCT GLYCOSYLATED HEMOGLOBIN (HGB A1C): Hemoglobin A1C: 5.8

## 2013-04-18 MED ORDER — LISINOPRIL 20 MG PO TABS: 20.0000 mg | ORAL_TABLET | Freq: Every day | ORAL | Status: DC

## 2013-04-18 MED ORDER — ATENOLOL-CHLORTHALIDONE 50-25 MG PO TABS: 1.0000 | ORAL_TABLET | Freq: Every day | ORAL | Status: DC

## 2013-04-18 MED ORDER — METFORMIN HCL 500 MG PO TABS: 500.0000 mg | ORAL_TABLET | Freq: Every day | ORAL | Status: DC

## 2013-04-18 MED ORDER — ALLOPURINOL 300 MG PO TABS: 300.0000 mg | ORAL_TABLET | Freq: Every day | ORAL | Status: DC

## 2013-04-18 MED ORDER — PRAVASTATIN SODIUM 20 MG PO TABS: 20.0000 mg | ORAL_TABLET | Freq: Every day | ORAL | Status: DC

## 2013-04-18 NOTE — Patient Instructions (Signed)
    Dr Myleah Cavendish's Recommendations  For nutrition information, I recommend books:  1).Eat to Live by Dr Joel Fuhrman. 2).Prevent and Reverse Heart Disease by Dr Caldwell Esselstyn. 3) Dr Neal Barnard's Book:  Program to Reverse Diabetes  Exercise recommendations are:  If unable to walk, then the patient can exercise in a chair 3 times a day. By flapping arms like a bird gently and raising legs outwards to the front.  If ambulatory, the patient can go for walks for 30 minutes 3 times a week. Then increase the intensity and duration as tolerated.  Goal is to try to attain exercise frequency to 5 times a week.  If applicable: Best to perform resistance exercises (machines or weights) 2 days a week and cardio type exercises 3 days per week.   Smoking Cessation Quitting smoking is important to your health and has many advantages. However, it is not always easy to quit since nicotine is a very addictive drug. Often times, people try 3 times or more before being able to quit. This document explains the best ways for you to prepare to quit smoking. Quitting takes hard work and a lot of effort, but you can do it. ADVANTAGES OF QUITTING SMOKING  You will live longer, feel better, and live better.  Your body will feel the impact of quitting smoking almost immediately.  Within 20 minutes, blood pressure decreases. Your pulse returns to its normal level.  After 8 hours, carbon monoxide levels in the blood return to normal. Your oxygen level increases.  After 24 hours, the chance of having a heart attack starts to decrease. Your breath, hair, and body stop smelling like smoke.  After 48 hours, damaged nerve endings begin to recover. Your sense of taste and smell improve.  After 72 hours, the body is virtually free of nicotine. Your bronchial tubes relax and breathing becomes easier.  After 2 to 12 weeks, lungs can hold more air. Exercise becomes easier and circulation improves.  The risk  of having a heart attack, stroke, cancer, or lung disease is greatly reduced.  After 1 year, the risk of coronary heart disease is cut in half.  After 5 years, the risk of stroke falls to the same as a nonsmoker.  After 10 years, the risk of lung cancer is cut in half and the risk of other cancers decreases significantly.  After 15 years, the risk of coronary heart disease drops, usually to the level of a nonsmoker.  If you are pregnant, quitting smoking will improve your chances of having a healthy baby.  The people you live with, especially any children, will be healthier.  You will have extra money to spend on things other than cigarettes. QUESTIONS TO THINK ABOUT BEFORE ATTEMPTING TO QUIT You may want to talk about your answers with your caregiver.  Why do you want to quit?  If you tried to quit in the past, what helped and what did not?  What will be the most difficult situations for you after you quit? How will you plan to handle them?  Who can help you through the tough times? Your family? Friends? A caregiver?  What pleasures do you get from smoking? What ways can you still get pleasure if you quit? Here are some questions to ask your caregiver:  How can you help me to be successful at quitting?  What medicine do you think would be best for me and how should I take it?  What should I   do if I need more help?  What is smoking withdrawal like? How can I get information on withdrawal? GET READY  Set a quit date.  Change your environment by getting rid of all cigarettes, ashtrays, matches, and lighters in your home, car, or work. Do not let people smoke in your home.  Review your past attempts to quit. Think about what worked and what did not. GET SUPPORT AND ENCOURAGEMENT You have a better chance of being successful if you have help. You can get support in many ways.  Tell your family, friends, and co-workers that you are going to quit and need their support. Ask  them not to smoke around you.  Get individual, group, or telephone counseling and support. Programs are available at [employer] and health centers. Call your local health department for information about programs in your area.  Spiritual beliefs and practices may help some smokers quit.  Download a "quit meter" on your computer to keep track of quit statistics, such as how long you have gone without smoking, cigarettes not smoked, and money saved.  Get a self-help book about quitting smoking and staying off of tobacco. Octa yourself from urges to smoke. Talk to someone, go for a walk, or occupy your time with a task.  Change your normal routine. Take a different route to work. Drink tea instead of coffee. Eat breakfast in a different place.  Reduce your stress. Take a hot bath, exercise, or read a book.  Plan something enjoyable to do every day. Reward yourself for not smoking.  Explore interactive web-based programs that specialize in helping you quit. GET MEDICINE AND USE IT CORRECTLY Medicines can help you stop smoking and decrease the urge to smoke. Combining medicine with the above behavioral methods and support can greatly increase your chances of successfully quitting smoking.  Nicotine replacement therapy helps deliver nicotine to your body without the negative effects and risks of smoking. Nicotine replacement therapy includes nicotine gum, lozenges, inhalers, nasal sprays, and skin patches. Some may be available over-the-counter and others require a prescription.  Antidepressant medicine helps people abstain from smoking, but how this works is unknown. This medicine is available by prescription.  Nicotinic receptor partial agonist medicine simulates the effect of nicotine in your brain. This medicine is available by prescription. Ask your caregiver for advice about which medicines to use and how to use them based on your health history.  Your caregiver will tell you what side effects to look out for if you choose to be on a medicine or therapy. Carefully read the information on the package. Do not use any other product containing nicotine while using a nicotine replacement product.  RELAPSE OR DIFFICULT SITUATIONS Most relapses occur within the first 3 months after quitting. Do not be discouraged if you start smoking again. Remember, most people try several times before finally quitting. You may have symptoms of withdrawal because your body is used to nicotine. You may crave cigarettes, be irritable, feel very hungry, cough often, get headaches, or have difficulty concentrating. The withdrawal symptoms are only temporary. They are strongest when you first quit, but they will go away within 10 14 days. To reduce the chances of relapse, try to:  Avoid drinking alcohol. Drinking lowers your chances of successfully quitting.  Reduce the amount of caffeine you consume. Once you quit smoking, the amount of caffeine in your body increases and can give you symptoms, such as a rapid heartbeat, sweating,  and anxiety.  Avoid smokers because they can make you want to smoke.  Do not let weight gain distract you. Many smokers will gain weight when they quit, usually less than 10 pounds. Eat a healthy diet and stay active. You can always lose the weight gained after you quit.  Find ways to improve your mood other than smoking. FOR MORE INFORMATION  www.smokefree.gov  Document Released: 06/14/2001 Document Revised: 12/20/2011 Document Reviewed: 09/29/2011 [facility] Patient Information 2014 [place], [place].    Diabetes and Foot Care Diabetes may cause you to have a poor blood supply (circulation) to your legs and feet. Because of this, the skin may be thinner, break easier, and heal more slowly. You also may have nerve damage in your legs and feet causing decreased feeling. You may not notice minor injuries to your feet that could lead to  serious problems or infections. Taking care of your feet is one of the most important things you can do for yourself.  HOME CARE INSTRUCTIONS  Do not go barefoot. Bare feet are easily injured.  Check your feet daily for blisters, cuts, and redness.  Wash your feet with warm water (not hot) and mild soap. Pat your feet and between your toes until completely dry.  Apply a moisturizing lotion that does not contain alcohol or petroleum jelly to the dry skin on your feet and to dry brittle toenails. Do not put it between your toes.  Trim your toenails straight across. Do not dig under them or around the cuticle.  Do not cut corns or calluses, or try to remove them with medicine.  Wear clean cotton socks or stockings every day. Make sure they are not too tight. Do not wear knee high stockings since they may decrease blood flow to your legs.  Wear leather shoes that fit properly and have enough cushioning. To break in new shoes, wear them just a few hours a day to avoid injuring your feet.  Wear shoes at all times, even in the house.  Do not cross your legs. This may decrease the blood flow to your feet.  If you find a minor scrape, cut, or break in the skin on your feet, keep it and the skin around it clean and dry. These areas may be cleansed with mild soap and water. Do not use peroxide, alcohol, iodine or Merthiolate.  When you remove an adhesive bandage, be sure not to harm the skin around it.  If you have a wound, look at it several times a day to make sure it is healing.  Do not use heating pads or hot water bottles. Burns can occur. If you have lost feeling in your feet or legs, you may not know it is happening until it is too late.  Report any cuts, sores or bruises to your caregiver. Do not wait! SEEK MEDICAL CARE IF:   You have an injury that is not healing or you notice redness, numbness, burning, or tingling.  Your feet always feel cold.  You have pain or cramps in your  legs and feet. SEEK IMMEDIATE MEDICAL CARE IF:   There is increasing redness, swelling, or increasing pain in the wound.  There is a red line that goes up your leg.  Pus is coming from a wound.  You develop an unexplained oral temperature above 102 F (38.9 C), or as your caregiver suggests.  You notice a bad smell coming from an ulcer or wound. MAKE SURE YOU:   Understand these instructions.  Will watch your condition.  Will get help right away if you are not doing well or get worse. Document Released: 06/17/2000 Document Revised: 09/12/2011 Document Reviewed: 12/24/2008 [facility] Patient Information 2014 [place], [place].

## 2013-04-18 NOTE — Progress Notes (Signed)
Patient ID: Nancy Thornton, female   DOB: 12-31-1960, 52 y.o.   MRN: [id number] SUBJECTIVE: CC: Chief Complaint  Patient presents with  . Follow-up    3 month follow up     HPI: Patient is here for follow up of Diabetes Mellitus/htn/hld/gout: Symptoms evaluated: Denies Nocturia ,Denies Urinary Frequency , denies Blurred vision ,deniesDizziness,denies.Dysuria,denies paresthesias, denies extremity pain or ulcers.[name] chest pain. has had an annual eye exam. do check the feet. Does check CBGs. Average CBG:not checking regularly Denies episodes of hypoglycemia. Does have an emergency hypoglycemic plan. admits toCompliance with medications. Denies Problems with medications.   Past Medical History  Diagnosis Date  . Diabetes mellitus without complication   . Hypertension   . Anal fissure     history of  . Obesity   . Asthma   . Depression   . 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 Cholecystectomy    . Abdominal hysterectomy    . Tonsillectomy    . Cystostomy w/ bladder biopsy  1990    punctured hole in bladder, had open procedure to repair   History   Social History  . Marital Status: Divorced    Spouse Name: N/A    Number of Children: 1  . Years of Education: N/A   Occupational History  . manufacturing    Social History Main Topics  . Smoking status: Current Every Day Smoker -- 0.50 packs/day    Types: Cigarettes    Start date: 10/18/1977  . Smokeless tobacco: Never Used  . Alcohol Use: No  . Drug Use: No  . Sexual Activity: Not on file   Other Topics Concern  . Not on file   Social History Narrative  . No narrative on file   Family History  Problem Relation Age of Onset  . Diabetes Mother   . Hypertension Father   . Colon cancer Neg Hx   . Esophageal cancer Neg Hx   . Rectal cancer Neg Hx   . Stomach cancer Neg Hx    Current Outpatient Prescriptions on File Prior to Visit   Medication Sig Dispense Refill  . Cyanocobalamin (VITAMIN B 12 PO) Take by mouth.      . Omega-3 Fatty Acids (FISH OIL PO) Take by mouth.       No current facility-administered medications on file prior to visit.   Allergies  Allergen Reactions  . Penicillins Hives   Immunization History  Administered Date(s) Administered  . Influenza,inj,Quad PF,36+ Mos 04/18/2013   Prior to Admission medications   Medication Sig Start Date End Date Taking? Authorizing Provider  allopurinol (ZYLOPRIM) 300 MG tablet Take 1 tablet (300 mg total) by mouth daily. 01/19/13  Yes Ileana Ladd, MD  atenolol-chlorthalidone (TENORETIC) 50-25 MG per tablet Take 1 tablet by mouth daily. 01/17/13  Yes Ileana Ladd, MD  Cyanocobalamin (VITAMIN B 12 PO) Take by mouth.   Yes Historical Provider, MD  lisinopril (PRINIVIL,ZESTRIL) 20 MG tablet Take 1 tablet (20 mg total) by mouth daily. 01/17/13  Yes Ileana Ladd, MD  metFORMIN (GLUCOPHAGE) 500 MG tablet Take 1 tablet (500 mg total) by mouth 2 (two) times daily with a meal. 01/17/13  Yes Ileana Ladd, MD  Omega-3 Fatty Acids (FISH OIL PO) Take by mouth.   Yes Historical Provider, MD  pravastatin (PRAVACHOL) 20 MG tablet Take 1 tablet (20 mg total) by mouth daily. 01/19/13  Yes  Ileana Ladd, MD     ROS: As above in the HPI. All other systems are stable or negative.  OBJECTIVE: APPEARANCE:  Patient in no acute distress.The patient appeared well nourished and normally developed. Acyanotic. Waist: VITAL SIGNS:BP 129/80  Pulse 67  Temp(Src) 97 F (36.1 C)  Wt 212 lb (96.163 kg)  BMI 34.73 kg/m2 WF central obesity  SKIN: warm and  Dry without overt rashes, tattoos and scars  HEAD and Neck: without JVD, Head and scalp: normal Eyes:No scleral icterus. Fundi normal, eye movements normal. Ears: Auricle normal, canal normal, Tympanic membranes normal, insufflation normal. Nose: normal Throat: normal Neck & thyroid: normal  CHEST & LUNGS: Chest wall:  normal Lungs: Clear  CVS: Reveals the PMI to be normally located. Regular rhythm, First and Second Heart sounds are normal,  absence of murmurs, rubs or gallops. Peripheral vasculature: Radial pulses: normal Dorsal pedis pulses: normal Posterior pulses: normal  ABDOMEN:  Appearance: obese Benign, no organomegaly, no masses, no Abdominal Aortic enlargement. No Guarding , no rebound. No Bruits. Bowel sounds: normal  RECTAL: N/A GU: N/A  EXTREMETIES: nonedematous.  MUSCULOSKELETAL:  Spine: normal Joints: intact  NEUROLOGIC: oriented to time,place and person; nonfocal. Strength is normal Sensory is normal Reflexes are normal Cranial Nerves are normal.  ASSESSMENT: DM (diabetes mellitus) - Plan: metFORMIN (GLUCOPHAGE) 500 MG tablet, POCT glycosylated hemoglobin (Hb A1C), CMP14+EGFR  HLD (hyperlipidemia) - Plan: pravastatin (PRAVACHOL) 20 MG tablet, NMR, lipoprofile  Need for prophylactic vaccination and inoculation against influenza  HTN (hypertension) - Plan: lisinopril (PRINIVIL,ZESTRIL) 20 MG tablet, atenolol-chlorthalidone (TENORETIC) 50-25 MG per tablet, CMP14+EGFR  Other dysphagia  Special screening for malignant neoplasms, colon  Gout - Plan: allopurinol (ZYLOPRIM) 300 MG tablet  Neuropathy in diabetes  PLAN:      Dr Woodroe Mode Recommendations  For nutrition information, I recommend books:  1).Eat to Live by Dr Monico Hoar. 2).Prevent and Reverse Heart Disease by Dr Suzzette Righter. 3) Dr Katherina Right Book:  Program to Reverse Diabetes  Exercise recommendations are:  If unable to walk, then the patient can exercise in a chair 3 times a day. By flapping arms like a bird gently and raising legs outwards to the front.  If ambulatory, the patient can go for walks for 30 minutes 3 times a week. Then increase the intensity and duration as tolerated.  Goal is to try to attain exercise frequency to 5 times a week.  If applicable: Best to perform  resistance exercises (machines or weights) 2 days a week and cardio type exercises 3 days per week.  Orders Placed This Encounter  Procedures  . CMP14+EGFR  . NMR, lipoprofile  . POCT glycosylated hemoglobin (Hb A1C)   Meds ordered this encounter  Medications  . metFORMIN (GLUCOPHAGE) 500 MG tablet    Sig: Take 1 tablet (500 mg total) by mouth daily with breakfast.    Dispense:  30 tablet    Refill:  5  . pravastatin (PRAVACHOL) 20 MG tablet    Sig: Take 1 tablet (20 mg total) by mouth daily.    Dispense:  30 tablet    Refill:  5  . allopurinol (ZYLOPRIM) 300 MG tablet    Sig: Take 1 tablet (300 mg total) by mouth daily.    Dispense:  30 tablet    Refill:  6  . lisinopril (PRINIVIL,ZESTRIL) 20 MG tablet    Sig: Take 1 tablet (20 mg total) by mouth daily.    Dispense:  30 tablet  Refill:  5  . atenolol-chlorthalidone (TENORETIC) 50-25 MG per tablet    Sig: Take 1 tablet by mouth daily.    Dispense:  30 tablet    Refill:  5   Medications Discontinued During This Encounter  Medication Reason  . metFORMIN (GLUCOPHAGE) 500 MG tablet Reorder  . pravastatin (PRAVACHOL) 20 MG tablet Reorder  . allopurinol (ZYLOPRIM) 300 MG tablet Reorder  . lisinopril (PRINIVIL,ZESTRIL) 20 MG tablet Reorder  . atenolol-chlorthalidone (TENORETIC) 50-25 MG per tablet Reorder   Return in about 4 months (around 08/19/2013) for Recheck medical problems.  Habeeb Puertas P. Modesto Charon, M.D.

## 2013-04-20 LAB — NMR, LIPOPROFILE
Cholesterol: 156 mg/dL (ref <200)
HDL Cholesterol by NMR: 51 mg/dL (ref >=40)
HDL Particle Number: 41.3 umol/L (ref >=30.5)
LDL Particle Number: 1184 nmol/L — ABNORMAL HIGH (ref <1000)
LDL Size: 20.8 nm (ref >20.5)
LDLC SERPL CALC-MCNC: 45 mg/dL (ref <100)
LP-IR Score: 69 — ABNORMAL HIGH (ref <=45)
Small LDL Particle Number: 479 nmol/L (ref <=527)
Triglycerides by NMR: 302 mg/dL — ABNORMAL HIGH (ref <150)

## 2013-04-20 LAB — CMP14+EGFR
ALT: 26 IU/L (ref 0–32)
AST: 25 IU/L (ref 0–40)
Albumin/Globulin Ratio: 2.5 (ref 1.1–2.5)
Albumin: 4.5 g/dL (ref 3.5–5.5)
Alkaline Phosphatase: 79 IU/L (ref 39–117)
BUN/Creatinine Ratio: 25 — ABNORMAL HIGH (ref 9–23)
BUN: 18 mg/dL (ref 6–24)
CO2: 28 mmol/L (ref 18–29)
Calcium: 9.8 mg/dL (ref 8.7–10.2)
Chloride: 98 mmol/L (ref 97–108)
Creatinine, Ser: 0.72 mg/dL (ref 0.57–1.00)
GFR calc Af Amer: 111 mL/min/{1.73_m2} (ref >59)
GFR calc non Af Amer: 97 mL/min/{1.73_m2} (ref >59)
Globulin, Total: 1.8 g/dL (ref 1.5–4.5)
Glucose: 115 mg/dL — ABNORMAL HIGH (ref 65–99)
Potassium: 3.8 mmol/L (ref 3.5–5.2)
Sodium: 142 mmol/L (ref 134–144)
Total Bilirubin: 0.6 mg/dL (ref 0.0–1.2)
Total Protein: 6.3 g/dL (ref 6.0–8.5)

## 2013-04-23 ENCOUNTER — Ambulatory Visit: Payer: BC Managed Care – PPO | Admitting: Family Medicine

## 2013-04-24 ENCOUNTER — Other Ambulatory Visit: Payer: Self-pay

## 2013-04-24 NOTE — Telephone Encounter (Signed)
Last seen 04/18/13  [facility]  This med not on EPIC list

## 2013-04-25 MED ORDER — ALBUTEROL SULFATE HFA 108 (90 BASE) MCG/ACT IN AERS: 2.0000 | INHALATION_SPRAY | Freq: Four times a day (QID) | RESPIRATORY_TRACT | Status: DC | PRN

## 2013-08-20 ENCOUNTER — Ambulatory Visit: Payer: BC Managed Care – PPO | Admitting: Family Medicine

## 2013-09-05 ENCOUNTER — Other Ambulatory Visit: Payer: Self-pay | Admitting: Family Medicine

## 2013-09-05 ENCOUNTER — Ambulatory Visit (INDEPENDENT_AMBULATORY_CARE_PROVIDER_SITE_OTHER): Payer: BC Managed Care – PPO | Admitting: Family Medicine

## 2013-09-05 ENCOUNTER — Ambulatory Visit (INDEPENDENT_AMBULATORY_CARE_PROVIDER_SITE_OTHER): Payer: BC Managed Care – PPO

## 2013-09-05 ENCOUNTER — Encounter: Payer: Self-pay | Admitting: Family Medicine

## 2013-09-05 VITALS — BP 121/82 | HR 60 | Temp 98.4°F | Ht 65.5 in | Wt 230.0 lb

## 2013-09-05 DIAGNOSIS — I10 Essential (primary) hypertension: Secondary | ICD-10-CM

## 2013-09-05 DIAGNOSIS — M109 Gout, unspecified: Secondary | ICD-10-CM

## 2013-09-05 DIAGNOSIS — E1140 Type 2 diabetes mellitus with diabetic neuropathy, unspecified: Secondary | ICD-10-CM

## 2013-09-05 DIAGNOSIS — M25561 Pain in right knee: Secondary | ICD-10-CM

## 2013-09-05 DIAGNOSIS — E1149 Type 2 diabetes mellitus with other diabetic neurological complication: Secondary | ICD-10-CM

## 2013-09-05 DIAGNOSIS — E119 Type 2 diabetes mellitus without complications: Secondary | ICD-10-CM

## 2013-09-05 DIAGNOSIS — M25569 Pain in unspecified knee: Secondary | ICD-10-CM

## 2013-09-05 DIAGNOSIS — E785 Hyperlipidemia, unspecified: Secondary | ICD-10-CM

## 2013-09-05 DIAGNOSIS — G4700 Insomnia, unspecified: Secondary | ICD-10-CM

## 2013-09-05 DIAGNOSIS — Z23 Encounter for immunization: Secondary | ICD-10-CM

## 2013-09-05 DIAGNOSIS — E1142 Type 2 diabetes mellitus with diabetic polyneuropathy: Secondary | ICD-10-CM

## 2013-09-05 LAB — POCT GLYCOSYLATED HEMOGLOBIN (HGB A1C): Hemoglobin A1C: 6.2

## 2013-09-05 MED ORDER — DOXEPIN HCL 10 MG PO CAPS: 10.0000 mg | ORAL_CAPSULE | Freq: Every day | ORAL | Status: DC

## 2013-09-05 NOTE — Patient Instructions (Addendum)
Dr Woodroe ModeFrancis Bettye Sitton's Recommendations  For nutrition information, I recommend books:  1).Eat to Live by Dr Monico HoarJoel Fuhrman. 2).Prevent and Reverse Heart Disease by Dr Suzzette Righteraldwell Esselstyn. 3) Dr Katherina RightNeal Barnard's Book:  Program to Reverse Diabetes  Exercise recommendations are:  If unable to walk, then the patient can exercise in a chair 3 times a day. By flapping arms like a bird gently and raising legs outwards to the front.  If ambulatory, the patient can go for walks for 30 minutes 3 times a week. Then increase the intensity and duration as tolerated.  Goal is to try to attain exercise frequency to 5 times a week.  If applicable: Best to perform resistance exercises (machines or weights) 2 days a week and cardio type exercises 3 days per week.  Insomnia Insomnia is frequent trouble falling and/or staying asleep. Insomnia can be a long term problem or a short term problem. Both are common. Insomnia can be a short term problem when the wakefulness is related to a certain stress or worry. Long term insomnia is often related to ongoing stress during waking hours and/or poor sleeping habits. Overtime, sleep deprivation itself can make the problem worse. Every little thing feels more severe because you are overtired and your ability to cope is decreased. CAUSES   Stress, anxiety, and depression.  Poor sleeping habits.  Distractions such as TV in the bedroom.  Naps close to bedtime.  Engaging in emotionally charged conversations before bed.  Technical reading before sleep.  Alcohol and other sedatives. They may make the problem worse. They can hurt normal sleep patterns and normal dream activity.  Stimulants such as caffeine for several hours prior to bedtime.  Pain syndromes and shortness of breath can cause insomnia.  Exercise late at night.  Changing time zones may cause sleeping problems (jet lag). It is sometimes helpful to have someone observe your sleeping patterns. They  should look for periods of not breathing during the night (sleep apnea). They should also look to see how long those periods last. If you live alone or observers are uncertain, you can also be observed at a sleep clinic where your sleep patterns will be professionally monitored. Sleep apnea requires a checkup and treatment. Give your caregivers your medical history. Give your caregivers observations your family has made about your sleep.  SYMPTOMS   Not feeling rested in the morning.  Anxiety and restlessness at bedtime.  Difficulty falling and staying asleep. TREATMENT   Your caregiver may prescribe treatment for an underlying medical disorders. Your caregiver can give advice or help if you are using alcohol or other drugs for self-medication. Treatment of underlying problems will usually eliminate insomnia problems.  Medications can be prescribed for short time use. They are generally not recommended for lengthy use.  Over-the-counter sleep medicines are not recommended for lengthy use. They can be habit forming.  You can promote easier sleeping by making lifestyle changes such as:  Using relaxation techniques that help with breathing and reduce muscle tension.  Exercising earlier in the day.  Changing your diet and the time of your last meal. No night time snacks.  Establish a regular time to go to bed.  Counseling can help with stressful problems and worry.  Soothing music and white noise may be helpful if there are background noises you cannot remove.  Stop tedious detailed work at least one hour before bedtime. HOME CARE INSTRUCTIONS   Keep a diary. Inform your caregiver about your progress.  This includes any medication side effects. See your caregiver regularly. Take note of:  Times when you are asleep.  Times when you are awake during the night.  The quality of your sleep.  How you feel the next day. This information will help your caregiver care for you.  Get out  of bed if you are still awake after 15 minutes. Read or do some quiet activity. Keep the lights down. Wait until you feel sleepy and go back to bed.  Keep regular sleeping and waking hours. Avoid naps.  Exercise regularly.  Avoid distractions at bedtime. Distractions include watching television or engaging in any intense or detailed activity like attempting to balance the household checkbook.  Develop a bedtime ritual. Keep a familiar routine of bathing, brushing your teeth, climbing into bed at the same time each night, listening to soothing music. Routines increase the success of falling to sleep faster.  Use relaxation techniques. This can be using breathing and muscle tension release routines. It can also include visualizing peaceful scenes. You can also help control troubling or intruding thoughts by keeping your mind occupied with boring or repetitive thoughts like the old concept of counting sheep. You can make it more creative like imagining planting one beautiful flower after another in your backyard garden.  During your day, work to eliminate stress. When this is not possible use some of the previous suggestions to help reduce the anxiety that accompanies stressful situations. MAKE SURE YOU:   Understand these instructions.  Will watch your condition.  Will get help right away if you are not doing well or get worse. Document Released: 06/17/2000 Document Revised: 09/12/2011 Document Reviewed: 07/18/2007 [facility] Patient Information 2014 [place], [place].

## 2013-09-05 NOTE — Progress Notes (Signed)
Patient ID: Nancy Thornton, female   DOB: 1960/08/17, 53 y.o.   MRN: [id number] SUBJECTIVE: CC: Chief Complaint  Patient presents with  . Diabetes    4 month recheck  . Hypertension  . Hyperlipidemia    HPI:  1) right kne pain. Knee feels like it would give way sometimes.  2 visits ago there was a little ball in the right knee and she is on her feet 8 hours a day.   2) insomnia  3) Patient is here for follow up of Diabetes Mellitus: Symptoms evaluated: Denies Nocturia ,Denies Urinary Frequency , denies Blurred vision ,deniesDizziness,denies.Dysuria,denies paresthesias, denies extremity pain or ulcers.[name] chest pain. has had an annual eye exam. do check the feet. Does check CBGs. Average CBG:not checking Denies episodes of hypoglycemia. Does have an emergency hypoglycemic plan. admits toCompliance with medications. Denies Problems with medications.  She is concerned that the government will cause her to lose her newly gained health insurance through the [facility]  Past Medical History  Diagnosis Date  . Diabetes mellitus without complication   . Hypertension   . Anal fissure     history of  . Obesity   . Asthma   . Depression   . 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 Cholecystectomy    . Abdominal hysterectomy    . Tonsillectomy    . Cystostomy w/ bladder biopsy  1990    punctured hole in bladder, had open procedure to repair   History   Social History  . Marital Status: Divorced    Spouse Name: N/A    Number of Children: 1  . Years of Education: N/A   Occupational History  . manufacturing    Social History Main Topics  . Smoking status: Current Every Day Smoker -- 0.50 packs/day    Types: Cigarettes    Start date: 10/18/1977  . Smokeless tobacco: Never Used  . Alcohol Use: No  . Drug Use: No  . Sexual Activity: Not on file   Other Topics Concern  . Not on file   Social  History Narrative  . No narrative on file   Family History  Problem Relation Age of Onset  . Diabetes Mother   . Hypertension Father   . Colon cancer Neg Hx   . Esophageal cancer Neg Hx   . Rectal cancer Neg Hx   . Stomach cancer Neg Hx    Current Outpatient Prescriptions on File Prior to Visit  Medication Sig Dispense Refill  . albuterol (PROVENTIL HFA;VENTOLIN HFA) 108 (90 BASE) MCG/ACT inhaler Inhale 2 puffs into the lungs every 6 (six) hours as needed for wheezing.  1 Inhaler  2  . allopurinol (ZYLOPRIM) 300 MG tablet Take 1 tablet (300 mg total) by mouth daily.  30 tablet  6  . atenolol-chlorthalidone (TENORETIC) 50-25 MG per tablet Take 1 tablet by mouth daily.  30 tablet  5  . lisinopril (PRINIVIL,ZESTRIL) 20 MG tablet Take 1 tablet (20 mg total) by mouth daily.  30 tablet  5  . metFORMIN (GLUCOPHAGE) 500 MG tablet Take 1 tablet (500 mg total) by mouth daily with breakfast.  30 tablet  5  . Omega-3 Fatty Acids (FISH OIL PO) Take by mouth.      . pravastatin (PRAVACHOL) 20 MG tablet Take 1 tablet (20 mg total) by mouth daily.  30 tablet  5  . Cyanocobalamin (VITAMIN B 12 PO) Take  by mouth.       No current facility-administered medications on file prior to visit.   Allergies  Allergen Reactions  . Penicillins Hives   Immunization History  Administered Date(s) Administered  . Influenza,inj,Quad PF,36+ Mos 04/18/2013  . Pneumococcal Conjugate-13 09/05/2013   Prior to Admission medications   Medication Sig Start Date End Date Taking? Authorizing Provider  albuterol (PROVENTIL HFA;VENTOLIN HFA) 108 (90 BASE) MCG/ACT inhaler Inhale 2 puffs into the lungs every 6 (six) hours as needed for wheezing. 04/24/13  Yes Mary-Margaret Hassell Done, FNP  allopurinol (ZYLOPRIM) 300 MG tablet Take 1 tablet (300 mg total) by mouth daily. 04/18/13  Yes Vernie Shanks, MD  atenolol-chlorthalidone (TENORETIC) 50-25 MG per tablet Take 1 tablet by mouth daily. 04/18/13  Yes Vernie Shanks, MD   lisinopril (PRINIVIL,ZESTRIL) 20 MG tablet Take 1 tablet (20 mg total) by mouth daily. 04/18/13  Yes Vernie Shanks, MD  metFORMIN (GLUCOPHAGE) 500 MG tablet Take 1 tablet (500 mg total) by mouth daily with breakfast. 04/18/13  Yes Vernie Shanks, MD  Omega-3 Fatty Acids (FISH OIL PO) Take by mouth.   Yes Historical Provider, MD  pravastatin (PRAVACHOL) 20 MG tablet Take 1 tablet (20 mg total) by mouth daily. 04/18/13  Yes Vernie Shanks, MD  Cyanocobalamin (VITAMIN B 12 PO) Take by mouth.    Historical Provider, MD     ROS: As above in the HPI. All other systems are stable or negative.  OBJECTIVE: APPEARANCE:  Patient in no acute distress.The patient appeared well nourished and normally developed. Acyanotic. Waist: VITAL SIGNS:BP 121/82  Pulse 60  Temp(Src) 98.4 F (36.9 C) (Oral)  Ht 5' 5.5" (1.664 m)  Wt 230 lb (104.327 kg)  BMI 37.68 kg/m2 Obese WF highly stressed.   SKIN: warm and  Dry without overt rashes, tattoos and scars  HEAD and Neck: without JVD, Head and scalp: normal Eyes:No scleral icterus. Fundi normal, eye movements normal. Ears: Auricle normal, canal normal, Tympanic membranes normal, insufflation normal. Nose: normal Throat: normal Neck & thyroid: normal  CHEST & LUNGS: Chest wall: normal Lungs: Clear  CVS: Reveals the PMI to be normally located. Regular rhythm, First and Second Heart sounds are normal,  absence of murmurs, rubs or gallops. Peripheral vasculature: Radial pulses: normal Dorsal pedis pulses: normal Posterior pulses: normal  ABDOMEN:  Appearance: normal Benign, no organomegaly, no masses, no Abdominal Aortic enlargement. No Guarding , no rebound. No Bruits. Bowel sounds: normal  RECTAL: N/A GU: N/A  EXTREMETIES: nonedematous.  MUSCULOSKELETAL:  Spine: normal Joints: intact. Left knee just medial to the right patella is a subcutaneuos cyst. Nontender.   NEUROLOGIC: oriented to time,place and person; nonfocal. Strength  is normal Sensory is normal Reflexes are normal Cranial Nerves are normal.  ASSESSMENT: Neuropathy in diabetes  HTN (hypertension)  HLD (hyperlipidemia) - Plan: CMP14+EGFR, NMR, lipoprofile  DM (diabetes mellitus) - Plan: POCT glycosylated hemoglobin (Hb A1C), CMP14+EGFR  Gout - Plan: Uric acid  Right knee pain - Plan: CANCELED: DG Knee Complete 4 Views Right  Need for prophylactic vaccination against Streptococcus pneumoniae (pneumococcus) - Plan: Pneumococcal conjugate vaccine 13-valent IM  Insomnia - Plan: doxepin (SINEQUAN) 10 MG capsule  PLAN:      Dr Paula Libra Recommendations  For nutrition information, I recommend books:  1).Eat to Live by Dr Excell Seltzer. 2).Prevent and Reverse Heart Disease by Dr Karl Luke. 3) Dr Janene Harvey Book:  Program to Reverse Diabetes  Exercise recommendations are:  If unable to walk, then  the patient can exercise in a chair 3 times a day. By flapping arms like a bird gently and raising legs outwards to the front.  If ambulatory, the patient can go for walks for 30 minutes 3 times a week. Then increase the intensity and duration as tolerated.  Goal is to try to attain exercise frequency to 5 times a week.  If applicable: Best to perform resistance exercises (machines or weights) 2 days a week and cardio type exercises 3 days per week.   Weight reduction and exercise.  Stress reduction and coping.counseled  Orders Placed This Encounter  Procedures  . Pneumococcal conjugate vaccine 13-valent IM  . CMP14+EGFR  . NMR, lipoprofile  . Uric acid  . POCT glycosylated hemoglobin (Hb A1C)  [facility] reading (PRIMARY) by  Dr. Jacelyn Grip: no acute findings                                Meds ordered this encounter  Medications  . doxepin (SINEQUAN) 10 MG capsule    Sig: Take 1 capsule (10 mg total) by mouth at bedtime.    Dispense:  30 capsule    Refill:  2   Await labs.  Advised to use a OTC knee brace. Leg extension  exercises. Weight reduction. Stress management counselling.  Dietary changes There are no discontinued medications. Return in about 4 weeks (around 10/03/2013) for recheck the right knee.  Bernita Beckstrom P. Jacelyn Grip, M.D.

## 2013-09-06 LAB — CMP14+EGFR
ALT: 41 IU/L — ABNORMAL HIGH (ref 0–32)
AST: 38 IU/L (ref 0–40)
Albumin/Globulin Ratio: 2.2 (ref 1.1–2.5)
Albumin: 4.4 g/dL (ref 3.5–5.5)
Alkaline Phosphatase: 73 IU/L (ref 39–117)
BUN/Creatinine Ratio: 23 (ref 9–23)
BUN: 15 mg/dL (ref 6–24)
CO2: 31 mmol/L — ABNORMAL HIGH (ref 18–29)
Calcium: 9.7 mg/dL (ref 8.7–10.2)
Chloride: 96 mmol/L — ABNORMAL LOW (ref 97–108)
Creatinine, Ser: 0.65 mg/dL (ref 0.57–1.00)
GFR calc Af Amer: 118 mL/min/{1.73_m2} (ref >59)
GFR calc non Af Amer: 102 mL/min/{1.73_m2} (ref >59)
Globulin, Total: 2 g/dL (ref 1.5–4.5)
Glucose: 183 mg/dL — ABNORMAL HIGH (ref 65–99)
Potassium: 4.3 mmol/L (ref 3.5–5.2)
Sodium: 143 mmol/L (ref 134–144)
Total Bilirubin: 0.7 mg/dL (ref 0.0–1.2)
Total Protein: 6.4 g/dL (ref 6.0–8.5)

## 2013-09-06 LAB — NMR, LIPOPROFILE
Cholesterol: 163 mg/dL (ref <200)
HDL Cholesterol by NMR: 58 mg/dL (ref >=40)
HDL Particle Number: 33.4 umol/L (ref >=30.5)
LDL Particle Number: 1009 nmol/L — ABNORMAL HIGH (ref <1000)
LDL Size: 20.9 nm (ref >20.5)
LDLC SERPL CALC-MCNC: 58 mg/dL (ref <100)
LP-IR Score: 59 — ABNORMAL HIGH (ref <=45)
Small LDL Particle Number: 366 nmol/L (ref <=527)
Triglycerides by NMR: 233 mg/dL — ABNORMAL HIGH (ref <150)

## 2013-09-06 LAB — URIC ACID: Uric Acid: 5.1 mg/dL (ref 2.5–7.1)

## 2013-09-28 ENCOUNTER — Other Ambulatory Visit: Payer: Self-pay | Admitting: Nurse Practitioner

## 2013-10-11 ENCOUNTER — Ambulatory Visit (INDEPENDENT_AMBULATORY_CARE_PROVIDER_SITE_OTHER): Payer: BC Managed Care – PPO | Admitting: Family Medicine

## 2013-10-11 ENCOUNTER — Encounter: Payer: Self-pay | Admitting: Family Medicine

## 2013-10-11 VITALS — BP 117/81 | HR 55 | Temp 98.1°F | Ht 65.5 in | Wt 226.2 lb

## 2013-10-11 DIAGNOSIS — M25561 Pain in right knee: Secondary | ICD-10-CM

## 2013-10-11 DIAGNOSIS — E785 Hyperlipidemia, unspecified: Secondary | ICD-10-CM

## 2013-10-11 DIAGNOSIS — H918X9 Other specified hearing loss, unspecified ear: Secondary | ICD-10-CM

## 2013-10-11 DIAGNOSIS — I10 Essential (primary) hypertension: Secondary | ICD-10-CM

## 2013-10-11 DIAGNOSIS — E1142 Type 2 diabetes mellitus with diabetic polyneuropathy: Secondary | ICD-10-CM

## 2013-10-11 DIAGNOSIS — E119 Type 2 diabetes mellitus without complications: Secondary | ICD-10-CM

## 2013-10-11 DIAGNOSIS — H6120 Impacted cerumen, unspecified ear: Secondary | ICD-10-CM | POA: Insufficient documentation

## 2013-10-11 DIAGNOSIS — M25569 Pain in unspecified knee: Secondary | ICD-10-CM

## 2013-10-11 DIAGNOSIS — M109 Gout, unspecified: Secondary | ICD-10-CM

## 2013-10-11 DIAGNOSIS — E1140 Type 2 diabetes mellitus with diabetic neuropathy, unspecified: Secondary | ICD-10-CM

## 2013-10-11 DIAGNOSIS — E1149 Type 2 diabetes mellitus with other diabetic neurological complication: Secondary | ICD-10-CM

## 2013-10-11 NOTE — Patient Instructions (Signed)
Continue with the knee brace and the exercises to strengthen the right knee and thigh for at least another 2 to 3 months.

## 2013-10-11 NOTE — Progress Notes (Signed)
Patient ID: Nancy Thornton, female   DOB: [birth year], 53 y.o.   MRN: [id number] SUBJECTIVE: CC: Chief Complaint  Patient presents with  . Follow-up    1 month follow up reck rt knee  doing better      HPI: Her for follow up of the right knee pain.the knee is fine now.  Other problems  Stable.  Patient is here for follow up of Diabetes Mellitus/HLD/HTN: Symptoms evaluated: Denies Nocturia ,Denies Urinary Frequency , denies Blurred vision ,deniesDizziness,denies.Dysuria,denies paresthesias, denies extremity pain or ulcers.[name] chest pain. has had an annual eye exam. do check the feet. Does check CBGs. Average CBG: Denies episodes of hypoglycemia. Does have an emergency hypoglycemic plan. admits toCompliance with medications. Denies Problems with medications.   Past Medical History  Diagnosis Date  . Diabetes mellitus without complication   . Hypertension   . Anal fissure     history of  . Obesity   . Asthma   . Depression   . 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 Cholecystectomy    . Abdominal hysterectomy    . Tonsillectomy    . Cystostomy w/ bladder biopsy  1990    punctured hole in bladder, had open procedure to repair   History   Social History  . Marital Status: Divorced    Spouse Name: N/A    Number of Children: 1  . Years of Education: N/A   Occupational History  . manufacturing    Social History Main Topics  . Smoking status: Current Every Day Smoker -- 0.50 packs/day    Types: Cigarettes    Start date: 10/18/1977  . Smokeless tobacco: Never Used  . Alcohol Use: No  . Drug Use: No  . Sexual Activity: Not on file   Other Topics Concern  . Not on file   Social History Narrative  . No narrative on file   Family History  Problem Relation Age of Onset  . Diabetes Mother   . Hypertension Father   . Colon cancer Neg Hx   . Esophageal cancer Neg Hx   . Rectal cancer  Neg Hx   . Stomach cancer Neg Hx    Current Outpatient Prescriptions on File Prior to Visit  Medication Sig Dispense Refill  . allopurinol (ZYLOPRIM) 300 MG tablet Take 1 tablet (300 mg total) by mouth daily.  30 tablet  6  . atenolol-chlorthalidone (TENORETIC) 50-25 MG per tablet Take 1 tablet by mouth daily.  30 tablet  5  . Cyanocobalamin (VITAMIN B 12 PO) Take by mouth.      . doxepin (SINEQUAN) 10 MG capsule Take 1 capsule (10 mg total) by mouth at bedtime.  30 capsule  2  . lisinopril (PRINIVIL,ZESTRIL) 20 MG tablet Take 1 tablet (20 mg total) by mouth daily.  30 tablet  5  . metFORMIN (GLUCOPHAGE) 500 MG tablet Take 1 tablet (500 mg total) by mouth daily with breakfast.  30 tablet  5  . Omega-3 Fatty Acids (FISH OIL PO) Take by mouth.      . pravastatin (PRAVACHOL) 20 MG tablet Take 1 tablet (20 mg total) by mouth daily.  30 tablet  5  . PROVENTIL HFA 108 (90 BASE) MCG/ACT inhaler INHALE TWO PUFFS BY MOUTH EVERY 6 HOURS AS NEEDED FOR WHEEZING  7 each  2   No current facility-administered medications on file prior to visit.   Allergies  Allergen  Reactions  . Penicillins Hives   Immunization History  Administered Date(s) Administered  . Influenza,inj,Quad PF,36+ Mos 04/18/2013  . Pneumococcal Conjugate-13 09/05/2013   Prior to Admission medications   Medication Sig Start Date End Date Taking? Authorizing Provider  allopurinol (ZYLOPRIM) 300 MG tablet Take 1 tablet (300 mg total) by mouth daily. 04/18/13  Yes Vernie Shanks, MD  atenolol-chlorthalidone (TENORETIC) 50-25 MG per tablet Take 1 tablet by mouth daily. 04/18/13  Yes Vernie Shanks, MD  Cyanocobalamin (VITAMIN B 12 PO) Take by mouth.   Yes Historical Provider, MD  doxepin (SINEQUAN) 10 MG capsule Take 1 capsule (10 mg total) by mouth at bedtime. 09/05/13  Yes Vernie Shanks, MD  lisinopril (PRINIVIL,ZESTRIL) 20 MG tablet Take 1 tablet (20 mg total) by mouth daily. 04/18/13  Yes Vernie Shanks, MD  metFORMIN (GLUCOPHAGE)  500 MG tablet Take 1 tablet (500 mg total) by mouth daily with breakfast. 04/18/13  Yes Vernie Shanks, MD  Omega-3 Fatty Acids (FISH OIL PO) Take by mouth.   Yes Historical Provider, MD  pravastatin (PRAVACHOL) 20 MG tablet Take 1 tablet (20 mg total) by mouth daily. 04/18/13  Yes Vernie Shanks, MD  PROVENTIL HFA 108 (90 BASE) MCG/ACT inhaler INHALE TWO PUFFS BY MOUTH EVERY 6 HOURS AS NEEDED FOR WHEEZING   Yes Vernie Shanks, MD     ROS: As above in the HPI. All other systems are stable or negative.  OBJECTIVE: APPEARANCE:  Patient in no acute distress.The patient appeared well nourished and normally developed. Acyanotic. Waist: VITAL SIGNS:BP 117/81  Pulse 55  Temp(Src) 98.1 F (36.7 C) (Oral)  Ht 5' 5.5" (1.664 m)  Wt 226 lb 3.2 oz (102.604 kg)  BMI 37.06 kg/m2  WF obesity  SKIN: warm and  Dry without overt rashes, tattoos and scars  HEAD and Neck: without JVD, Head and scalp: normal Eyes:No scleral icterus. Fundi normal, eye movements normal. Ears: Auricle normal, canal bilateral impacted cerumen. Nose: normal Throat: normal Neck & thyroid: normal  CHEST & LUNGS: Chest wall: normal Lungs: Clear  CVS: Reveals the PMI to be normally located. Regular rhythm, First and Second Heart sounds are normal,  absence of murmurs, rubs or gallops. Peripheral vasculature: Radial pulses: normal Dorsal pedis pulses: normal Posterior pulses: normal  ABDOMEN:  Appearance: Obese Benign, no organomegaly, no masses, no Abdominal Aortic enlargement. No Guarding , no rebound. No Bruits. Bowel sounds: normal  RECTAL: N/A GU: N/A  EXTREMETIES: nonedematous.  MUSCULOSKELETAL:  Spine: normal Joints: intact: right knee is intact.  NEUROLOGIC: oriented to time,place and person; nonfocal. Strength is normal Sensory is normal Reflexes are normal Cranial Nerves are normal.  Results for orders placed in visit on 09/05/13  CMP14+EGFR      Result Value Ref Range   Glucose 183  (*) 65 - 99 mg/dL   BUN 15  6 - 24 mg/dL   Creatinine, Ser 0.65  0.57 - 1.00 mg/dL   GFR calc non Af Amer 102  >59 mL/min/1.73   GFR calc Af Amer 118  >59 mL/min/1.73   BUN/Creatinine Ratio 23  9 - 23   Sodium 143  134 - 144 mmol/L   Potassium 4.3  3.5 - 5.2 mmol/L   Chloride 96 (*) 97 - 108 mmol/L   CO2 31 (*) 18 - 29 mmol/L   Calcium 9.7  8.7 - 10.2 mg/dL   Total Protein 6.4  6.0 - 8.5 g/dL   Albumin 4.4  3.5 - 5.5 g/dL  Globulin, Total 2.0  1.5 - 4.5 g/dL   Albumin/Globulin Ratio 2.2  1.1 - 2.5   Total Bilirubin 0.7  0.0 - 1.2 mg/dL   Alkaline Phosphatase 73  39 - 117 IU/L   AST 38  0 - 40 IU/L   ALT 41 (*) 0 - 32 IU/L  NMR, LIPOPROFILE      Result Value Ref Range   LDL Particle Number 1009 (*) <1000 nmol/L   LDLC SERPL CALC-MCNC 58  <100 mg/dL   HDL Cholesterol by NMR 58  >=40 mg/dL   Triglycerides by NMR 233 (*) <150 mg/dL   Cholesterol 163  <200 mg/dL   HDL Particle Number 33.4  >=30.5 umol/L   Small LDL Particle Number 366  <=527 nmol/L   LDL Size 20.9  >20.5 nm   LP-IR Score 59 (*) <=45  URIC ACID      Result Value Ref Range   Uric Acid 5.1  2.5 - 7.1 mg/dL  POCT GLYCOSYLATED HEMOGLOBIN (HGB A1C)      Result Value Ref Range   Hemoglobin A1C 6.2      ASSESSMENT:  Right knee pain - resolved  Neuropathy in diabetes  HTN (hypertension)  HLD (hyperlipidemia)  Gout  Hearing loss secondary to cerumen impaction  DM (diabetes mellitus)  PLAN:  labs reviewed with patient and copies of the labs are given to the patient. No change in medications Continue with the knee brace and the exercises to strengthen the right knee and thigh for at least another 2 to 3 months.   No orders of the defined types were placed in this encounter.   No orders of the defined types were placed in this encounter.   There are no discontinued medications. Return in about 3 months (around 01/10/2014) for Recheck medical problems.  Jakyri Brunkhorst P. Jacelyn Grip, M.D.

## 2013-12-06 ENCOUNTER — Ambulatory Visit (INDEPENDENT_AMBULATORY_CARE_PROVIDER_SITE_OTHER): Payer: BC Managed Care – PPO

## 2013-12-06 ENCOUNTER — Ambulatory Visit (INDEPENDENT_AMBULATORY_CARE_PROVIDER_SITE_OTHER): Payer: BC Managed Care – PPO | Admitting: Family Medicine

## 2013-12-06 ENCOUNTER — Encounter: Payer: Self-pay | Admitting: Family Medicine

## 2013-12-06 VITALS — BP 135/79 | HR 73 | Temp 98.3°F | Ht 65.5 in | Wt 228.0 lb

## 2013-12-06 DIAGNOSIS — H01139 Eczematous dermatitis of unspecified eye, unspecified eyelid: Secondary | ICD-10-CM

## 2013-12-06 DIAGNOSIS — M79609 Pain in unspecified limb: Secondary | ICD-10-CM

## 2013-12-06 DIAGNOSIS — Z1881 Retained glass fragments: Secondary | ICD-10-CM

## 2013-12-06 DIAGNOSIS — K130 Diseases of lips: Secondary | ICD-10-CM

## 2013-12-06 MED ORDER — FLUTICASONE PROPIONATE 0.05 % EX CREA: TOPICAL_CREAM | Freq: Two times a day (BID) | CUTANEOUS | Status: DC

## 2013-12-06 MED ORDER — NYSTATIN 100000 UNIT/GM EX CREA: 1.0000 "application " | TOPICAL_CREAM | Freq: Two times a day (BID) | CUTANEOUS | Status: DC

## 2013-12-06 NOTE — Progress Notes (Signed)
   Subjective:    Patient ID: Nancy Thornton, female    DOB: 08/31/1960, 53 y.o.   MRN: [id number]  HPI This 53 y.o. female presents for evaluation of glass in her foot.  She states she took the glass out of her left foot with some tweezers but still feels some discomfort in her foot.  She has rash on corner of mouth and left arm.   Review of Systems No chest pain, SOB, HA, dizziness, vision change, N/V, diarrhea, constipation, dysuria, urinary urgency or frequency, myalgias, arthralgias or rash.     Objective:   Physical Exam Vital signs noted  Well developed well nourished female.  HEENT - Head atraumatic Normocephalic                Eyes - PERRLA, Conjuctiva - clear Sclera- Clear EOMI                Ears - EAC's Wnl TM's Wnl Gross Hearing WNL                Throat - oropharanx wnl Respiratory - Lungs CTA bilateral Cardiac - RRR S1 and S2 without murmur GI - Abdomen soft Nontender and bowel sounds active x 4 Extremities - No edema. Neuro - Grossly intact. Skin - left antecubital area with eczema, bilateral corner of mouth with angular cheilitis  Xray left foot - S/p ORIF left first toe and no FB seen Prelimnary reading by Chrissie Noa Carthel Castille,FNP     Assessment & Plan:  Embedded glass fragments - Plan: DG Foot Complete Left - no fb or glass seen in foot and if pain doesn't resolve then would see podiatrist.  Eczematous dermatitis of eyelid - Plan: fluticasone (CUTIVATE) 0.05 % cream  Angular cheilitis - Plan: nystatin cream (MYCOSTATIN)  Deatra Canter FNP

## 2014-01-14 ENCOUNTER — Ambulatory Visit (INDEPENDENT_AMBULATORY_CARE_PROVIDER_SITE_OTHER): Payer: BC Managed Care – PPO | Admitting: Family

## 2014-01-14 ENCOUNTER — Encounter: Payer: Self-pay | Admitting: Family

## 2014-01-14 VITALS — BP 127/82 | HR 64 | Temp 98.0°F | Ht 65.5 in | Wt 224.8 lb

## 2014-01-14 DIAGNOSIS — J45909 Unspecified asthma, uncomplicated: Secondary | ICD-10-CM

## 2014-01-14 DIAGNOSIS — I10 Essential (primary) hypertension: Secondary | ICD-10-CM

## 2014-01-14 DIAGNOSIS — Z1321 Encounter for screening for nutritional disorder: Secondary | ICD-10-CM

## 2014-01-14 DIAGNOSIS — E119 Type 2 diabetes mellitus without complications: Secondary | ICD-10-CM

## 2014-01-14 DIAGNOSIS — E785 Hyperlipidemia, unspecified: Secondary | ICD-10-CM

## 2014-01-14 DIAGNOSIS — L989 Disorder of the skin and subcutaneous tissue, unspecified: Secondary | ICD-10-CM

## 2014-01-14 LAB — POCT GLYCOSYLATED HEMOGLOBIN (HGB A1C): Hemoglobin A1C: 6.2

## 2014-01-14 MED ORDER — LISINOPRIL 20 MG PO TABS: 20.0000 mg | ORAL_TABLET | Freq: Every day | ORAL | Status: DC

## 2014-01-14 MED ORDER — BUDESONIDE-FORMOTEROL FUMARATE 80-4.5 MCG/ACT IN AERO: 2.0000 | INHALATION_SPRAY | Freq: Two times a day (BID) | RESPIRATORY_TRACT | Status: DC

## 2014-01-14 MED ORDER — ALBUTEROL SULFATE HFA 108 (90 BASE) MCG/ACT IN AERS: 2.0000 | INHALATION_SPRAY | Freq: Four times a day (QID) | RESPIRATORY_TRACT | Status: DC | PRN

## 2014-01-14 MED ORDER — HYDROCHLOROTHIAZIDE 25 MG PO TABS: 25.0000 mg | ORAL_TABLET | Freq: Every day | ORAL | Status: DC

## 2014-01-14 MED ORDER — ATENOLOL 50 MG PO TABS: 50.0000 mg | ORAL_TABLET | Freq: Every day | ORAL | Status: DC

## 2014-01-14 NOTE — Progress Notes (Signed)
Subjective:    Patient ID: Nancy Thornton, female    DOB: Jul 09, 1960, 53 y.o.   MRN: [id number]  Diabetes She presents for her follow-up diabetic visit. She has type 2 diabetes mellitus. Her disease course has been improving. Pertinent negatives for hypoglycemia include no confusion, dizziness or headaches. Pertinent negatives for diabetes include no blurred vision, no foot ulcerations, no polyuria and no visual change. Symptoms are stable. Pertinent negatives for diabetic complications include no CVA, heart disease, nephropathy or peripheral neuropathy. Risk factors for coronary artery disease include diabetes mellitus, dyslipidemia, family history, hypertension, obesity and post-menopausal. Current diabetic treatment includes oral agent (monotherapy). She is following a generally healthy diet. She participates in exercise daily. An ACE inhibitor/angiotensin II receptor blocker is being taken. Eye exam is not current.  Hypertension This is a chronic problem. The current episode started more than 1 year ago. The problem has been waxing and waning since onset. The problem is uncontrolled. Pertinent negatives include no blurred vision, headaches, palpitations, peripheral edema or shortness of breath. Risk factors for coronary artery disease include dyslipidemia, diabetes mellitus, family history, obesity, post-menopausal state and smoking/tobacco exposure. Past treatments include diuretics, beta blockers and ACE inhibitors. The current treatment provides moderate improvement. There is no history of kidney disease, CAD/MI, CVA, heart failure or a thyroid problem. There is no history of sleep apnea.  Hyperlipidemia This is a chronic problem. The current episode started more than 1 year ago. The problem is uncontrolled. Recent lipid tests were reviewed and are high. Factors aggravating her hyperlipidemia include fatty foods and smoking. Pertinent negatives include no shortness of breath. Current  antihyperlipidemic treatment includes diet change. The current treatment provides mild improvement of lipids. Risk factors for coronary artery disease include diabetes mellitus, dyslipidemia, family history, hypertension, obesity and post-menopausal.  Asthma Pt currently taking albuterol inhaler 2 puffs every morning. Pt states she wheezes a lot, but states she "just needs to stop smoking".    Review of Systems  Constitutional: Negative.   HENT: Negative.   Eyes: Negative.  Negative for blurred vision.  Respiratory: Negative.  Negative for shortness of breath.   Cardiovascular: Negative.  Negative for palpitations.  Gastrointestinal: Negative.   Endocrine: Negative.  Negative for polyuria.  Genitourinary: Negative.   Musculoskeletal: Negative.   Neurological: Negative.  Negative for dizziness and headaches.  Hematological: Negative.   Psychiatric/Behavioral: Negative.  Negative for confusion.  All other systems reviewed and are negative.      Objective:   Physical Exam  Vitals reviewed. Constitutional: She is oriented to person, place, and time. She appears well-developed and well-nourished. No distress.  HENT:  Head: Normocephalic and atraumatic.  Right Ear: External ear normal.  Mouth/Throat: Oropharynx is clear and moist.  Eyes: Pupils are equal, round, and reactive to light.  Neck: Normal range of motion. Neck supple. No thyromegaly present.  Cardiovascular: Normal rate, regular rhythm, normal heart sounds and intact distal pulses.   No murmur heard. Pulmonary/Chest: Effort normal. No respiratory distress. She has wheezes.  Abdominal: Soft. Bowel sounds are normal. She exhibits no distension. There is no tenderness.  Musculoskeletal: Normal range of motion. She exhibits no edema and no tenderness.  Neurological: She is alert and oriented to person, place, and time. She has normal reflexes. No cranial nerve deficit.  Skin: Skin is warm and dry.  Psychiatric: She has a  normal mood and affect. Her behavior is normal. Judgment and thought content normal.      BP 127/82  Pulse 64  Temp(Src) 98 F (36.7 C) (Oral)  Ht 5' 5.5" (1.664 m)  Wt 224 lb 12.8 oz (101.969 kg)  BMI 36.83 kg/m2     Assessment & Plan:  1. Essential hypertension - hydrochlorothiazide (HYDRODIURIL) 25 MG tablet; Take 1 tablet (25 mg total) by mouth daily.  Dispense: 90 tablet; Refill: 3 - atenolol (TENORMIN) 50 MG tablet; Take 1 tablet (50 mg total) by mouth daily.  Dispense: 90 tablet; Refill: 3 - CMP14+EGFR - lisinopril (PRINIVIL,ZESTRIL) 20 MG tablet; Take 1 tablet (20 mg total) by mouth daily.  Dispense: 90 tablet; Refill: 3  2. Type 2 diabetes mellitus without complication - POCT glycosylated hemoglobin (Hb A1C)  3. HLD (hyperlipidemia) - Lipid panel -Pt does not want to be on mediations unless labs are "very high". Pt states she exercises "alot".  4. Unspecified asthma(493.90) - albuterol (PROAIR HFA) 108 (90 BASE) MCG/ACT inhaler; Inhale 2 puffs into the lungs every 6 (six) hours as needed for wheezing or shortness of breath.  Dispense: 3.7 g; Refill: 6  5. Encounter for vitamin deficiency screening - Vit D  25 hydroxy (rtn osteoporosis monitoring)  6. Skin lesion of back - Ambulatory referral to Dermatology   Continue all meds Labs pending Health Maintenance reviewed Diet and exercise encouraged RTO 3 months   Evelina Dun, FNP

## 2014-01-14 NOTE — Patient Instructions (Addendum)
Health Maintenance, Female A healthy lifestyle and preventative care can promote health and wellness.  Maintain regular health, dental, and eye exams.  Eat a healthy diet. Foods like vegetables, fruits, whole grains, low-fat dairy products, and lean protein foods contain the nutrients you need without too many calories. Decrease your intake of foods high in solid fats, added sugars, and salt. Get information about a proper diet from your caregiver, if necessary.  Regular physical exercise is one of the most important things you can do for your health. Most adults should get at least 150 minutes of moderate-intensity exercise (any activity that increases your heart rate and causes you to sweat) each week. In addition, most adults need muscle-strengthening exercises on 2 or more days a week.   Maintain a healthy weight. The body mass index (BMI) is a screening tool to identify possible weight problems. It provides an estimate of body fat based on height and weight. Your caregiver can help determine your BMI, and can help you achieve or maintain a healthy weight. For adults 20 years and older:  A BMI below 18.5 is considered underweight.  A BMI of 18.5 to 24.9 is normal.  A BMI of 25 to 29.9 is considered overweight.  A BMI of 30 and above is considered obese.  Maintain normal blood lipids and cholesterol by exercising and minimizing your intake of saturated fat. Eat a balanced diet with plenty of fruits and vegetables. Blood tests for lipids and cholesterol should begin at age 41 and be repeated every 5 years. If your lipid or cholesterol levels are high, you are over 50, or you are a high risk for heart disease, you may need your cholesterol levels checked more frequently.Ongoing high lipid and cholesterol levels should be treated with medicines if diet and exercise are not effective.  If you smoke, find out from your caregiver how to quit. If you do not use tobacco, do not start.  Lung  cancer screening is recommended for adults aged 66-80 years who are at high risk for developing lung cancer because of a history of smoking. Yearly low-dose computed tomography (CT) is recommended for people who have at least a 30-pack-year history of smoking and are a current smoker or have quit within the past 15 years. A pack year of smoking is smoking an average of 1 pack of cigarettes a day for 1 year (for example: 1 pack a day for 30 years or 2 packs a day for 15 years). Yearly screening should continue until the smoker has stopped smoking for at least 15 years. Yearly screening should also be stopped for people who develop a health problem that would prevent them from having lung cancer treatment.  If you are pregnant, do not drink alcohol. If you are breastfeeding, be very cautious about drinking alcohol. If you are not pregnant and choose to drink alcohol, do not exceed 1 drink per day. One drink is considered to be 12 ounces (355 mL) of beer, 5 ounces (148 mL) of wine, or 1.5 ounces (44 mL) of liquor.  Avoid use of street drugs. Do not share needles with anyone. Ask for help if you need support or instructions about stopping the use of drugs.  High blood pressure causes heart disease and increases the risk of stroke. Blood pressure should be checked at least every 1 to 2 years. Ongoing high blood pressure should be treated with medicines, if weight loss and exercise are not effective.  If you are 55 to 53  years old, ask your caregiver if you should take aspirin to prevent strokes.  Diabetes screening involves taking a blood sample to check your fasting blood sugar level. This should be done once every 3 years, after age 45, if you are within normal weight and without risk factors for diabetes. Testing should be considered at a younger age or be carried out more frequently if you are overweight and have at least 1 risk factor for diabetes.  Breast cancer screening is essential preventative care  for women. You should practice "breast self-awareness." This means understanding the normal appearance and feel of your breasts and may include breast self-examination. Any changes detected, no matter how small, should be reported to a caregiver. Women in their 20s and 30s should have a clinical breast exam (CBE) by a caregiver as part of a regular health exam every 1 to 3 years. After age 40, women should have a CBE every year. Starting at age 40, women should consider having a mammogram (breast X-ray) every year. Women who have a family history of breast cancer should talk to their caregiver about genetic screening. Women at a high risk of breast cancer should talk to their caregiver about having an MRI and a mammogram every year.  Breast cancer gene (BRCA)-related cancer risk assessment is recommended for women who have family members with BRCA-related cancers. BRCA-related cancers include breast, ovarian, tubal, and peritoneal cancers. Having family members with these cancers may be associated with an increased risk for harmful changes (mutations) in the breast cancer genes BRCA1 and BRCA2. Results of the assessment will determine the need for genetic counseling and BRCA1 and BRCA2 testing.  The Pap test is a screening test for cervical cancer. Women should have a Pap test starting at age 21. Between ages 21 and 29, Pap tests should be repeated every 2 years. Beginning at age 30, you should have a Pap test every 3 years as long as the past 3 Pap tests have been normal. If you had a hysterectomy for a problem that was not cancer or a condition that could lead to cancer, then you no longer need Pap tests. If you are between ages 65 and 70, and you have had normal Pap tests going back 10 years, you no longer need Pap tests. If you have had past treatment for cervical cancer or a condition that could lead to cancer, you need Pap tests and screening for cancer for at least 20 years after your treatment. If Pap  tests have been discontinued, risk factors (such as a new sexual partner) need to be reassessed to determine if screening should be resumed. Some women have medical problems that increase the chance of getting cervical cancer. In these cases, your caregiver may recommend more frequent screening and Pap tests.  The human papillomavirus (HPV) test is an additional test that may be used for cervical cancer screening. The HPV test looks for the virus that can cause the cell changes on the cervix. The cells collected during the Pap test can be tested for HPV. The HPV test could be used to screen women aged 30 years and older, and should be used in women of any age who have unclear Pap test results. After the age of 30, women should have HPV testing at the same frequency as a Pap test.  Colorectal cancer can be detected and often prevented. Most routine colorectal cancer screening begins at the age of 50 and continues through age 75. However, your caregiver may   recommend screening at an earlier age if you have risk factors for colon cancer. On a yearly basis, your caregiver may provide home test kits to check for hidden blood in the stool. Use of a small camera at the end of a tube, to directly examine the colon (sigmoidoscopy or colonoscopy), can detect the earliest forms of colorectal cancer. Talk to your caregiver about this at age 97, when routine screening begins. Direct examination of the colon should be repeated every 5 to 10 years through age 74, unless early forms of pre-cancerous polyps or small growths are found.  Hepatitis C blood testing is recommended for all people born from [birth year] through 1965 and any individual with known risks for hepatitis C.  Practice safe sex. Use condoms and avoid high-risk sexual practices to reduce the spread of sexually transmitted infections (STIs). Sexually active women aged 62 and younger should be checked for Chlamydia, which is a common sexually transmitted infection.  Older women with new or multiple partners should also be tested for Chlamydia. Testing for other STIs is recommended if you are sexually active and at increased risk.  Osteoporosis is a disease in which the bones lose minerals and strength with aging. This can result in serious bone fractures. The risk of osteoporosis can be identified using a bone density scan. Women ages 32 and over and women at risk for fractures or osteoporosis should discuss screening with their caregivers. Ask your caregiver whether you should be taking a calcium supplement or vitamin D to reduce the rate of osteoporosis.  Menopause can be associated with physical symptoms and risks. Hormone replacement therapy is available to decrease symptoms and risks. You should talk to your caregiver about whether hormone replacement therapy is right for you.  Use sunscreen. Apply sunscreen liberally and repeatedly throughout the day. You should seek shade when your shadow is shorter than you. Protect yourself by wearing long sleeves, pants, a wide-brimmed hat, and sunglasses year round, whenever you are outdoors.  Notify your caregiver of new moles or changes in moles, especially if there is a change in shape or color. Also notify your caregiver if a mole is larger than the size of a pencil eraser.  Stay current with your immunizations. Document Released: 01/03/2011 Document Revised: 10/15/2012 Document Reviewed: 05/22/2013 Smoking Cessation Quitting smoking is important to your health and has many advantages. However, it is not always easy to quit since nicotine is a very addictive drug. Often times, people try 3 times or more before being able to quit. This document explains the best ways for you to prepare to quit smoking. Quitting takes hard work and a lot of effort, but you can do it. ADVANTAGES OF QUITTING SMOKING You will live longer, feel better, and live better. Your body will feel the impact of quitting smoking almost  immediately. Within 20 minutes, blood pressure decreases. Your pulse returns to its normal level. After 8 hours, carbon monoxide levels in the blood return to normal. Your oxygen level increases. After 24 hours, the chance of having a heart attack starts to decrease. Your breath, hair, and body stop smelling like smoke. After 48 hours, damaged nerve endings begin to recover. Your sense of taste and smell improve. After 72 hours, the body is virtually free of nicotine. Your bronchial tubes relax and breathing becomes easier. After 2 to 12 weeks, lungs can hold more air. Exercise becomes easier and circulation improves. The risk of having a heart attack, stroke, cancer, or lung disease is greatly  reduced. After 1 year, the risk of coronary heart disease is cut in half. After 5 years, the risk of stroke falls to the same as a nonsmoker. After 10 years, the risk of lung cancer is cut in half and the risk of other cancers decreases significantly. After 15 years, the risk of coronary heart disease drops, usually to the level of a nonsmoker. If you are pregnant, quitting smoking will improve your chances of having a healthy baby. The people you live with, especially any children, will be healthier. You will have extra money to spend on things other than cigarettes. QUESTIONS TO THINK ABOUT BEFORE ATTEMPTING TO QUIT You may want to talk about your answers with your caregiver. Why do you want to quit? If you tried to quit in the past, what helped and what did not? What will be the most difficult situations for you after you quit? How will you plan to handle them? Who can help you through the tough times? Your family? Friends? A caregiver? What pleasures do you get from smoking? What ways can you still get pleasure if you quit? Here are some questions to ask your caregiver: How can you help me to be successful at quitting? What medicine do you think would be best for me and how should I take it? What  should I do if I need more help? What is smoking withdrawal like? How can I get information on withdrawal? GET READY Set a quit date. Change your environment by getting rid of all cigarettes, ashtrays, matches, and lighters in your home, car, or work. Do not let people smoke in your home. Review your past attempts to quit. Think about what worked and what did not. GET SUPPORT AND ENCOURAGEMENT You have a better chance of being successful if you have help. You can get support in many ways. Tell your family, friends, and co-workers that you are going to quit and need their support. Ask them not to smoke around you. Get individual, group, or telephone counseling and support. Programs are available at [employer] and health centers. Call your local health department for information about programs in your area. Spiritual beliefs and practices may help some smokers quit. Download a "quit meter" on your computer to keep track of quit statistics, such as how long you have gone without smoking, cigarettes not smoked, and money saved. Get a self-help book about quitting smoking and staying off of tobacco. [place] yourself from urges to smoke. Talk to someone, go for a walk, or occupy your time with a task. Change your normal routine. Take a different route to work. Drink tea instead of coffee. Eat breakfast in a different place. Reduce your stress. Take a hot bath, exercise, or read a book. Plan something enjoyable to do every day. Reward yourself for not smoking. Explore interactive web-based programs that specialize in helping you quit. GET MEDICINE AND USE IT CORRECTLY Medicines can help you stop smoking and decrease the urge to smoke. Combining medicine with the above behavioral methods and support can greatly increase your chances of successfully quitting smoking. Nicotine replacement therapy helps deliver nicotine to your body without the negative effects and  risks of smoking. Nicotine replacement therapy includes nicotine gum, lozenges, inhalers, nasal sprays, and skin patches. Some may be available over-the-counter and others require a prescription. Antidepressant medicine helps people abstain from smoking, but how this works is unknown. This medicine is available by prescription. Nicotinic receptor partial agonist medicine simulates  the effect of nicotine in your brain. This medicine is available by prescription. Ask your caregiver for advice about which medicines to use and how to use them based on your health history. Your caregiver will tell you what side effects to look out for if you choose to be on a medicine or therapy. Carefully read the information on the package. Do not use any other product containing nicotine while using a nicotine replacement product.  RELAPSE OR DIFFICULT SITUATIONS Most relapses occur within the first 3 months after quitting. Do not be discouraged if you start smoking again. Remember, most people try several times before finally quitting. You may have symptoms of withdrawal because your body is used to nicotine. You may crave cigarettes, be irritable, feel very hungry, cough often, get headaches, or have difficulty concentrating. The withdrawal symptoms are only temporary. They are strongest when you first quit, but they will go away within 10-14 days. To reduce the chances of relapse, try to: Avoid drinking alcohol. Drinking lowers your chances of successfully quitting. Reduce the amount of caffeine you consume. Once you quit smoking, the amount of caffeine in your body increases and can give you symptoms, such as a rapid heartbeat, sweating, and anxiety. Avoid smokers because they can make you want to smoke. Do not let weight gain distract you. Many smokers will gain weight when they quit, usually less than 10 pounds. Eat a healthy diet and stay active. You can always lose the weight gained after you quit. Find ways to  improve your mood other than smoking. FOR MORE INFORMATION  www.smokefree.gov  Document Released: 06/14/2001 Document Revised: 12/20/2011 Document Reviewed: 09/29/2011 [facility] Patient Information 2015 [place], [place]. This information is not intended to replace advice given to you by your health care provider. Make sure you discuss any questions you have with your health care provider. [employer] Patient Information 2015 [place]. This information is not intended to replace advice given to you by your health care provider. Make sure you discuss any questions you have with your health care provider.

## 2014-01-15 ENCOUNTER — Other Ambulatory Visit: Payer: Self-pay | Admitting: Family

## 2014-01-15 DIAGNOSIS — E785 Hyperlipidemia, unspecified: Secondary | ICD-10-CM

## 2014-01-15 LAB — CMP14+EGFR
ALT: 24 IU/L (ref 0–32)
AST: 28 IU/L (ref 0–40)
Albumin/Globulin Ratio: 2.8 — ABNORMAL HIGH (ref 1.1–2.5)
Albumin: 4.8 g/dL (ref 3.5–5.5)
Alkaline Phosphatase: 57 IU/L (ref 39–117)
BUN/Creatinine Ratio: 25 — ABNORMAL HIGH (ref 9–23)
BUN: 17 mg/dL (ref 6–24)
CO2: 26 mmol/L (ref 18–29)
Calcium: 9.9 mg/dL (ref 8.7–10.2)
Chloride: 97 mmol/L (ref 97–108)
Creatinine, Ser: 0.67 mg/dL (ref 0.57–1.00)
GFR calc Af Amer: 117 mL/min/{1.73_m2} (ref >59)
GFR calc non Af Amer: 101 mL/min/{1.73_m2} (ref >59)
Globulin, Total: 1.7 g/dL (ref 1.5–4.5)
Glucose: 104 mg/dL — ABNORMAL HIGH (ref 65–99)
Potassium: 4.4 mmol/L (ref 3.5–5.2)
Sodium: 142 mmol/L (ref 134–144)
Total Bilirubin: 0.8 mg/dL (ref 0.0–1.2)
Total Protein: 6.5 g/dL (ref 6.0–8.5)

## 2014-01-15 LAB — LIPID PANEL
Chol/HDL Ratio: 3.7 ratio units (ref 0.0–4.4)
Cholesterol, Total: 195 mg/dL (ref 100–199)
HDL: 53 mg/dL (ref >39)
LDL Calculated: 108 mg/dL — ABNORMAL HIGH (ref 0–99)
Triglycerides: 172 mg/dL — ABNORMAL HIGH (ref 0–149)
VLDL Cholesterol Cal: 34 mg/dL (ref 5–40)

## 2014-01-15 LAB — VITAMIN D 25 HYDROXY (VIT D DEFICIENCY, FRACTURES): Vit D, 25-Hydroxy: 18.8 ng/mL — ABNORMAL LOW (ref 30.0–100.0)

## 2014-01-15 MED ORDER — VITAMIN D (ERGOCALCIFEROL) 1.25 MG (50000 UNIT) PO CAPS: 50000.0000 [IU] | ORAL_CAPSULE | ORAL | Status: DC

## 2014-01-15 MED ORDER — PRAVASTATIN SODIUM 20 MG PO TABS: 20.0000 mg | ORAL_TABLET | Freq: Every day | ORAL | Status: DC

## 2014-01-16 ENCOUNTER — Telehealth: Payer: Self-pay | Admitting: Family

## 2014-01-16 NOTE — Telephone Encounter (Signed)
Message copied by Doreatha MassedMOORE, MITZI on Thu Jan 16, 2014  3:02 PM ------      Message from: Lendon ColonelHAWKS, [place] A      Created: Wed Jan 15, 2014 11:41 AM       Hgb A1C WNL      Kidney and liver function stable      Cholesterol level elevated and triglycerides are high- Need to be on low fat diet and rx sent to pharmacy      Vit D levels low- RX sent to pharmacy ------

## 2014-01-21 ENCOUNTER — Encounter: Payer: Self-pay | Admitting: Family

## 2014-02-13 ENCOUNTER — Emergency Department (HOSPITAL_COMMUNITY)
Admission: EM | Admit: 2014-02-13 | Discharge: 2014-02-14 | Disposition: A | Discharge status: Home / Self Care | Payer: BC Managed Care – PPO | Attending: Emergency Medicine | Admitting: Emergency Medicine

## 2014-02-13 DIAGNOSIS — R1013 Epigastric pain: Secondary | ICD-10-CM | POA: Diagnosis not present

## 2014-02-13 DIAGNOSIS — R7402 Elevation of levels of lactic acid dehydrogenase (LDH): Secondary | ICD-10-CM | POA: Insufficient documentation

## 2014-02-13 DIAGNOSIS — Z8719 Personal history of other diseases of the digestive system: Secondary | ICD-10-CM | POA: Diagnosis not present

## 2014-02-13 DIAGNOSIS — Z79899 Other long term (current) drug therapy: Secondary | ICD-10-CM | POA: Diagnosis not present

## 2014-02-13 DIAGNOSIS — R7401 Elevation of levels of liver transaminase levels: Secondary | ICD-10-CM | POA: Insufficient documentation

## 2014-02-13 DIAGNOSIS — R740 Nonspecific elevation of levels of transaminase and lactic acid dehydrogenase [LDH]: Secondary | ICD-10-CM

## 2014-02-13 DIAGNOSIS — R1012 Left upper quadrant pain: Secondary | ICD-10-CM

## 2014-02-13 DIAGNOSIS — J45909 Unspecified asthma, uncomplicated: Secondary | ICD-10-CM | POA: Diagnosis not present

## 2014-02-13 DIAGNOSIS — E669 Obesity, unspecified: Secondary | ICD-10-CM | POA: Diagnosis not present

## 2014-02-13 DIAGNOSIS — F329 Major depressive disorder, single episode, unspecified: Secondary | ICD-10-CM | POA: Insufficient documentation

## 2014-02-13 DIAGNOSIS — IMO0002 Reserved for concepts with insufficient information to code with codable children: Secondary | ICD-10-CM | POA: Insufficient documentation

## 2014-02-13 DIAGNOSIS — Z90710 Acquired absence of both cervix and uterus: Secondary | ICD-10-CM | POA: Insufficient documentation

## 2014-02-13 DIAGNOSIS — F3289 Other specified depressive episodes: Secondary | ICD-10-CM | POA: Insufficient documentation

## 2014-02-13 DIAGNOSIS — F17200 Nicotine dependence, unspecified, uncomplicated: Secondary | ICD-10-CM | POA: Insufficient documentation

## 2014-02-13 DIAGNOSIS — Z9089 Acquired absence of other organs: Secondary | ICD-10-CM | POA: Insufficient documentation

## 2014-02-13 DIAGNOSIS — I10 Essential (primary) hypertension: Secondary | ICD-10-CM | POA: Diagnosis not present

## 2014-02-13 DIAGNOSIS — E119 Type 2 diabetes mellitus without complications: Secondary | ICD-10-CM | POA: Insufficient documentation

## 2014-02-13 DIAGNOSIS — Z880 Allergy status to penicillin: Secondary | ICD-10-CM | POA: Diagnosis not present

## 2014-02-13 LAB — URINALYSIS, ROUTINE W REFLEX MICROSCOPIC
Bilirubin Urine: NEGATIVE
Glucose, UA: NEGATIVE mg/dL
Hgb urine dipstick: NEGATIVE
Leukocytes, UA: NEGATIVE
Nitrite: NEGATIVE
Protein, ur: NEGATIVE mg/dL
Specific Gravity, Urine: >1.03 — ABNORMAL HIGH (ref 1.005–1.030)
Urobilinogen, UA: 0.2 mg/dL (ref 0.0–1.0)
pH: 5.5 (ref 5.0–8.0)

## 2014-02-13 LAB — CBC WITH DIFFERENTIAL/PLATELET
Basophils Absolute: 0 10*3/uL (ref 0.0–0.1)
Basophils Relative: 0 % (ref 0–1)
Eosinophils Absolute: 0.1 10*3/uL (ref 0.0–0.7)
Eosinophils Relative: 2 % (ref 0–5)
HCT: 38.4 % (ref 36.0–46.0)
Hemoglobin: 13.9 g/dL (ref 12.0–15.0)
Lymphocytes Relative: 23 % (ref 12–46)
Lymphs Abs: 1.6 10*3/uL (ref 0.7–4.0)
MCH: 32.1 pg (ref 26.0–34.0)
MCHC: 36.2 g/dL — ABNORMAL HIGH (ref 30.0–36.0)
MCV: 88.7 fL (ref 78.0–100.0)
Monocytes Absolute: 0.4 10*3/uL (ref 0.1–1.0)
Monocytes Relative: 6 % (ref 3–12)
Neutro Abs: 4.7 10*3/uL (ref 1.7–7.7)
Neutrophils Relative %: 69 % (ref 43–77)
Platelets: 212 10*3/uL (ref 150–400)
RBC: 4.33 MIL/uL (ref 3.87–5.11)
RDW: 12.1 % (ref 11.5–15.5)
WBC: 6.8 10*3/uL (ref 4.0–10.5)

## 2014-02-13 MED ORDER — ONDANSETRON 8 MG PO TBDP
8.0000 mg | ORAL_TABLET | Freq: Once | ORAL | Status: AC
  Administered 2014-02-13: 8 mg via ORAL
  Filled 2014-02-13: qty 1

## 2014-02-13 MED ORDER — GI COCKTAIL ~~LOC~~
30.0000 mL | Freq: Once | ORAL | Status: AC
  Administered 2014-02-13: 30 mL via ORAL
  Filled 2014-02-13: qty 30

## 2014-02-13 MED ORDER — PANTOPRAZOLE SODIUM 40 MG PO TBEC
40.0000 mg | DELAYED_RELEASE_TABLET | Freq: Once | ORAL | Status: AC
  Administered 2014-02-13: 40 mg via ORAL
  Filled 2014-02-13: qty 1

## 2014-02-13 NOTE — ED Provider Notes (Signed)
CSN: [id number]     Arrival date & time 02/13/14  2254 History   First MD Initiated Contact with Patient 02/13/14 2322   This chart was scribed for Dione Booze, MD by Gwenevere Abbot, ED scribe. This patient was seen in room [facility]06/[facility]06 and the patient's care was started at 11:26 PM.    Chief Complaint  Patient presents with  . Abdominal Pain   The history is provided by the patient. No language interpreter was used.   HPI Comments:  Nancy Thornton is a 53 y.o. female with a h/o of chloestectomy who presents to the Emergency Department complaining of intermittent, sharp, 6/10, left upper abdomen pain, that radiates, onset last night. Pt has associated symptoms of nausea, and constipation. Pt denies vomiting. Pt states that it modifies pain if she moves. Pt states that she has been able to tolerate food. Pt has not taken any OTC medication for pain. Pt reports that she has a PCP. Pt reports that still has her appendix. Pt reports that she is a smoker.   Past Medical History  Diagnosis Date  . Diabetes mellitus without complication   . Hypertension   . Anal fissure     history of  . Obesity   . Asthma   . Depression   . 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 Cholecystectomy    . Abdominal hysterectomy    . Tonsillectomy    . Cystostomy w/ bladder biopsy  1990    punctured hole in bladder, had open procedure to repair   Family History  Problem Relation Age of Onset  . Diabetes Mother   . Hypertension Father   . Colon cancer Neg Hx   . Esophageal cancer Neg Hx   . Rectal cancer Neg Hx   . Stomach cancer Neg Hx    History  Substance Use Topics  . Smoking status: Current Every Day Smoker -- 0.50 packs/day    Types: Cigarettes    Start date: 10/18/1977  . Smokeless tobacco: Never Used  . Alcohol Use: No   OB History   Grav Para Term Preterm Abortions TAB SAB Ect Mult Living                 Review of  Systems  All other systems reviewed and are negative.     Allergies  Penicillins  Home Medications   Prior to Admission medications   Medication Sig Start Date End Date Taking? Authorizing Provider  albuterol (PROAIR HFA) 108 (90 BASE) MCG/ACT inhaler Inhale 2 puffs into the lungs every 6 (six) hours as needed for wheezing or shortness of breath. 01/14/14  Yes Junie Spencer, FNP  atenolol (TENORMIN) 50 MG tablet Take 1 tablet (50 mg total) by mouth daily. 01/14/14  Yes Junie Spencer, FNP  budesonide-formoterol (SYMBICORT) 80-4.5 MCG/ACT inhaler Inhale 2 puffs into the lungs 2 (two) times daily. 01/14/14  Yes Junie Spencer, FNP  hydrochlorothiazide (HYDRODIURIL) 25 MG tablet Take 1 tablet (25 mg total) by mouth daily. 01/14/14  Yes Junie Spencer, FNP  lisinopril (PRINIVIL,ZESTRIL) 20 MG tablet Take 1 tablet (20 mg total) by mouth daily. 01/14/14  Yes Junie Spencer, FNP  metFORMIN (GLUCOPHAGE) 500 MG tablet Take 1 tablet (500 mg total) by mouth daily with breakfast. 04/18/13  Yes Ileana Ladd, MD  Omega-3 Fatty Acids (FISH OIL PO) Take by mouth.   Yes Historical  Provider, MD  pravastatin (PRAVACHOL) 20 MG tablet Take 1 tablet (20 mg total) by mouth daily. 01/15/14  Yes Junie Spencer, FNP  Vitamin D, Ergocalciferol, (DRISDOL) 50000 UNITS CAPS capsule Take 1 capsule (50,000 Units total) by mouth every 7 (seven) days. 01/15/14  Yes Junie Spencer, FNP  allopurinol (ZYLOPRIM) 300 MG tablet Take 1 tablet (300 mg total) by mouth daily. 04/18/13   Ileana Ladd, MD  Cyanocobalamin (VITAMIN B 12 PO) Take by mouth.    Historical Provider, MD  doxepin (SINEQUAN) 10 MG capsule Take 1 capsule (10 mg total) by mouth at bedtime. 09/05/13   Ileana Ladd, MD  fluticasone (CUTIVATE) 0.05 % cream Apply topically 2 (two) times daily. 12/06/13   Deatra Canter, FNP  nystatin cream (MYCOSTATIN) Apply 1 application topically 2 (two) times daily. 12/06/13   Deatra Canter, FNP   BP 168/99  Pulse 58   Temp(Src) 98.1 F (36.7 C) (Oral)  Resp 18  Ht 5' 5.5" (1.664 m)  Wt 220 lb (99.791 kg)  BMI 36.04 kg/m2  SpO2 98% Physical Exam  Constitutional: She is oriented to person, place, and time. She appears well-developed and well-nourished.  HENT:  Head: Normocephalic and atraumatic.  Eyes: Conjunctivae and EOM are normal. Pupils are equal, round, and reactive to light. No scleral icterus.  Neck: Normal range of motion. Neck supple. No JVD present.  Cardiovascular: Normal rate, regular rhythm and normal heart sounds.   No murmur heard. Pulmonary/Chest: Effort normal and breath sounds normal. She has no wheezes. She has no rales. She exhibits no tenderness.  Abdominal: Soft. Bowel sounds are normal. She exhibits no distension and no mass. There is tenderness (Moderate epigastric tenderness). There is no rebound and no guarding.  Musculoskeletal: Normal range of motion. She exhibits no edema.  Lymphadenopathy:    She has no cervical adenopathy.  Neurological: She is alert and oriented to person, place, and time. She has normal reflexes. She exhibits normal muscle tone. Coordination normal.  Skin: Skin is warm and dry. No rash noted.  Psychiatric: She has a normal mood and affect. Her behavior is normal. Thought content normal.    ED Course  Procedures  DIAGNOSTIC STUDIES: Oxygen Saturation is 98% on RA, normal by my interpretation.  COORDINATION OF CARE: 11:35 PM-Discussed treatment plan with pt at bedside and pt agreed to plan.  Labs Review Results for orders placed during the hospital encounter of 02/13/14  URINALYSIS, ROUTINE W REFLEX MICROSCOPIC      Result Value Ref Range   Color, Urine YELLOW  YELLOW   APPearance CLEAR  CLEAR   Specific Gravity, Urine >1.030 (*) 1.005 - 1.030   pH 5.5  5.0 - 8.0   Glucose, UA NEGATIVE  NEGATIVE mg/dL   Hgb urine dipstick NEGATIVE  NEGATIVE   Bilirubin Urine NEGATIVE  NEGATIVE   Ketones, ur TRACE (*) NEGATIVE mg/dL   Protein, ur NEGATIVE   NEGATIVE mg/dL   Urobilinogen, UA 0.2  0.0 - 1.0 mg/dL   Nitrite NEGATIVE  NEGATIVE   Leukocytes, UA NEGATIVE  NEGATIVE  COMPREHENSIVE METABOLIC PANEL      Result Value Ref Range   Sodium 139  137 - 147 mEq/L   Potassium 3.9  3.7 - 5.3 mEq/L   Chloride 103  96 - 112 mEq/L   CO2 24  19 - 32 mEq/L   Glucose, Bld 105 (*) 70 - 99 mg/dL   BUN 15  6 - 23 mg/dL   Creatinine,  Ser 0.57  0.50 - 1.10 mg/dL   Calcium 9.1  8.4 - 16.110.5 mg/dL   Total Protein 6.4  6.0 - 8.3 g/dL   Albumin 3.9  3.5 - 5.2 g/dL   AST 60 (*) 0 - 37 U/L   ALT 73 (*) 0 - 35 U/L   Alkaline Phosphatase 64  39 - 117 U/L   Total Bilirubin 1.0  0.3 - 1.2 mg/dL   GFR calc non Af Amer >90  >90 mL/min   GFR calc Af Amer >90  >90 mL/min   Anion gap 12  5 - 15  LIPASE, BLOOD      Result Value Ref Range   Lipase 28  11 - 59 U/L  CBC WITH DIFFERENTIAL      Result Value Ref Range   WBC 6.8  4.0 - 10.5 K/uL   RBC 4.33  3.87 - 5.11 MIL/uL   Hemoglobin 13.9  12.0 - 15.0 g/dL   HCT 09.638.4  04.536.0 - 40.946.0 %   MCV 88.7  78.0 - 100.0 fL   MCH 32.1  26.0 - 34.0 pg   MCHC 36.2 (*) 30.0 - 36.0 g/dL   RDW 81.112.1  91.411.5 - 78.215.5 %   Platelets 212  150 - 400 K/uL   Neutrophils Relative % 69  43 - 77 %   Neutro Abs 4.7  1.7 - 7.7 K/uL   Lymphocytes Relative 23  12 - 46 %   Lymphs Abs 1.6  0.7 - 4.0 K/uL   Monocytes Relative 6  3 - 12 %   Monocytes Absolute 0.4  0.1 - 1.0 K/uL   Eosinophils Relative 2  0 - 5 %   Eosinophils Absolute 0.1  0.0 - 0.7 K/uL   Basophils Relative 0  0 - 1 %   Basophils Absolute 0.0  0.0 - 0.1 K/uL   MDM   Final diagnoses:  None    Left upper quadrant pain with tenderness actually present in the epigastric area. I'm concerned about possible peptic ulcer disease. She'll be given a GI cocktail and a dose of pantoprazole. She is given a dose of ondansetron for nausea. Old records are reviewed and she has no relevant past visits.  She states that she did not feel any better after above noted treatment. Workup is  significant only for mild elevation of transaminases of uncertain cause. I did not feel that this is related to her current complaints. She is advised to have transaminase levels rechecked in one to 2 weeks. I reviewed her prior labs in our system and she has no other episodes of elevated transaminases. She is discharged with to go packs of oxycodone-acetaminophen and ondansetron and she is given a prescription for pantoprazole.  I personally performed the services described in this documentation, which was scribed in my presence. The recorded information has been reviewed and is accurate.       Dione Boozeavid Daemon Dowty, MD 02/14/14 [phone]

## 2014-02-13 NOTE — ED Notes (Signed)
Pt reports burning and intermittently sharp pain to left upper abd, states it comes in waves.  Pt admits to mild nausea, no diarrhea.

## 2014-02-14 LAB — COMPREHENSIVE METABOLIC PANEL
ALT: 73 U/L — ABNORMAL HIGH (ref 0–35)
AST: 60 U/L — ABNORMAL HIGH (ref 0–37)
Albumin: 3.9 g/dL (ref 3.5–5.2)
Alkaline Phosphatase: 64 U/L (ref 39–117)
Anion gap: 12 (ref 5–15)
BUN: 15 mg/dL (ref 6–23)
CO2: 24 mEq/L (ref 19–32)
Calcium: 9.1 mg/dL (ref 8.4–10.5)
Chloride: 103 mEq/L (ref 96–112)
Creatinine, Ser: 0.57 mg/dL (ref 0.50–1.10)
GFR calc Af Amer: >90 mL/min (ref >90)
GFR calc non Af Amer: >90 mL/min (ref >90)
Glucose, Bld: 105 mg/dL — ABNORMAL HIGH (ref 70–99)
Potassium: 3.9 mEq/L (ref 3.7–5.3)
Sodium: 139 mEq/L (ref 137–147)
Total Bilirubin: 1 mg/dL (ref 0.3–1.2)
Total Protein: 6.4 g/dL (ref 6.0–8.3)

## 2014-02-14 LAB — LIPASE, BLOOD: Lipase: 28 U/L (ref 11–59)

## 2014-02-14 MED ORDER — ONDANSETRON HCL 4 MG PO TABS: 4.0000 mg | ORAL_TABLET | Freq: Four times a day (QID) | ORAL | Status: DC | PRN

## 2014-02-14 MED ORDER — PANTOPRAZOLE SODIUM 40 MG PO TBEC: 40.0000 mg | DELAYED_RELEASE_TABLET | Freq: Every day | ORAL | Status: DC

## 2014-02-14 MED ORDER — OXYCODONE-ACETAMINOPHEN 5-325 MG PO TABS: 1.0000 | ORAL_TABLET | ORAL | Status: DC | PRN

## 2014-02-14 NOTE — Discharge Instructions (Signed)
Two of your liver tests are mildly abnormal - AST and ALT. Please see your PCP in 1-2 weeks to recheck those blood tests.  Abdominal Pain Many things can cause abdominal pain. Usually, abdominal pain is not caused by a disease and will improve without treatment. It can often be observed and treated at home. Your health care provider will do a physical exam and possibly order blood tests and X-rays to help determine the seriousness of your pain. However, in many cases, more time must pass before a clear cause of the pain can be found. Before that point, your health care provider may not know if you need more testing or further treatment. HOME CARE INSTRUCTIONS  Monitor your abdominal pain for any changes. The following actions may help to alleviate any discomfort you are experiencing:  Only take over-the-counter or prescription medicines as directed by your health care provider.  Do not take laxatives unless directed to do so by your health care provider.  Try a clear liquid diet (broth, tea, or water) as directed by your health care provider. Slowly move to a bland diet as tolerated. SEEK MEDICAL CARE IF:  You have unexplained abdominal pain.  You have abdominal pain associated with nausea or diarrhea.  You have pain when you urinate or have a bowel movement.  You experience abdominal pain that wakes you in the night.  You have abdominal pain that is worsened or improved by eating food.  You have abdominal pain that is worsened with eating fatty foods.  You have a fever. SEEK IMMEDIATE MEDICAL CARE IF:   Your pain does not go away within 2 hours.  You keep throwing up (vomiting).  Your pain is felt only in portions of the abdomen, such as the right side or the left lower portion of the abdomen.  You pass bloody or black tarry stools. MAKE SURE YOU:  Understand these instructions.   Will watch your condition.   Will get help right away if you are not doing well or get  worse.  Document Released: 03/30/2005 Document Revised: 06/25/2013 Document Reviewed: 02/27/2013 [facility] Patient Information 2015 [place], [place]. This information is not intended to replace advice given to you by your health care provider. Make sure you discuss any questions you have with your health care provider.  Pantoprazole tablets What is this medicine? PANTOPRAZOLE (pan TOE pra zole) prevents the production of acid in the stomach. It is used to treat gastroesophageal reflux disease (GERD), inflammation of the esophagus, and Zollinger-Ellison syndrome. This medicine may be used for other purposes; ask your health care provider or pharmacist if you have questions. COMMON BRAND NAME(S): Protonix What should I tell my health care provider before I take this medicine? They need to know if you have any of these conditions: -liver disease -low levels of magnesium in the blood -an unusual or allergic reaction to omeprazole, lansoprazole, pantoprazole, rabeprazole, other medicines, foods, dyes, or preservatives -pregnant or trying to get pregnant -breast-feeding How should I use this medicine? Take this medicine by mouth. Swallow the tablets whole with a drink of water. Follow the directions on the prescription label. Do not crush, break, or chew. Take your medicine at regular intervals. Do not take your medicine more often than directed. Talk to your pediatrician regarding the use of this medicine in children. While this drug may be prescribed for children as young as 5 years for selected conditions, precautions do apply. Overdosage: If you think you have taken too much of this  medicine contact a poison control center or emergency room at once. NOTE: This medicine is only for you. Do not share this medicine with others. What if I miss a dose? If you miss a dose, take it as soon as you can. If it is almost time for your next dose, take only that dose. Do not take double or extra doses. What  may interact with this medicine? Do not take this medicine with any of the following medications: -atazanavir -nelfinavir This medicine may also interact with the following medications: -ampicillin -delavirdine -digoxin -diuretics -iron salts -medicines for fungal infections like ketoconazole, itraconazole and voriconazole -warfarin This list may not describe all possible interactions. Give your health care provider a list of all the medicines, herbs, non-prescription drugs, or dietary supplements you use. Also tell them if you smoke, drink alcohol, or use illegal drugs. Some items may interact with your medicine. What should I watch for while using this medicine? It can take several days before your stomach pain gets better. Check with your doctor or health care professional if your condition does not start to get better, or if it gets worse. You may need blood work done while you are taking this medicine. What side effects may I notice from receiving this medicine? Side effects that you should report to your doctor or health care professional as soon as possible: -allergic reactions like skin rash, itching or hives, swelling of the face, lips, or tongue -bone, muscle or joint pain -breathing problems -chest pain or chest tightness -dark yellow or brown urine -dizziness -fast, irregular heartbeat -feeling faint or lightheaded -fever or sore throat -muscle spasm -palpitations -redness, blistering, peeling or loosening of the skin, including inside the mouth -seizures -tremors -unusual bleeding or bruising -unusually weak or tired -yellowing of the eyes or skin Side effects that usually do not require medical attention (Report these to your doctor or health care professional if they continue or are bothersome.): -constipation -diarrhea -dry mouth -headache -nausea This list may not describe all possible side effects. Call your doctor for medical advice about side effects. You  may report side effects to FDA at 1-800-FDA-1088. Where should I keep my medicine? Keep out of the reach of children. Store at room temperature between 15 and 30 degrees C (59 and 86 degrees F). Protect from light and moisture. Throw away any unused medicine after the expiration date. NOTE: This sheet is a summary. It may not cover all possible information. If you have questions about this medicine, talk to your doctor, pharmacist, or health care provider.  2015, Elsevier/Gold Standard. (2012-04-18 16:40:16)  Ondansetron tablets What is this medicine? ONDANSETRON (on DAN se tron) is used to treat nausea and vomiting caused by chemotherapy. It is also used to prevent or treat nausea and vomiting after surgery. This medicine may be used for other purposes; ask your health care provider or pharmacist if you have questions. COMMON BRAND NAME(S): Zofran What should I tell my health care provider before I take this medicine? They need to know if you have any of these conditions: -heart disease -history of irregular heartbeat -liver disease -low levels of magnesium or potassium in the blood -an unusual or allergic reaction to ondansetron, granisetron, other medicines, foods, dyes, or preservatives -pregnant or trying to get pregnant -breast-feeding How should I use this medicine? Take this medicine by mouth with a glass of water. Follow the directions on your prescription label. Take your doses at regular intervals. Do not take your medicine more  often than directed. Talk to your pediatrician regarding the use of this medicine in children. Special care may be needed. Overdosage: If you think you have taken too much of this medicine contact a poison control center or emergency room at once. NOTE: This medicine is only for you. Do not share this medicine with others. What if I miss a dose? If you miss a dose, take it as soon as you can. If it is almost time for your next dose, take only that dose.  Do not take double or extra doses. What may interact with this medicine? Do not take this medicine with any of the following medications: -apomorphine -certain medicines for fungal infections like fluconazole, itraconazole, ketoconazole, posaconazole, voriconazole -cisapride -dofetilide -dronedarone -pimozide -thioridazine -ziprasidone This medicine may also interact with the following medications: -carbamazepine -certain medicines for depression, anxiety, or psychotic disturbances -fentanyl -linezolid -MAOIs like Carbex, Eldepryl, Marplan, Nardil, and Parnate -methylene blue (injected into a vein) -other medicines that prolong the QT interval (cause an abnormal heart rhythm) -phenytoin -rifampicin -tramadol This list may not describe all possible interactions. Give your health care provider a list of all the medicines, herbs, non-prescription drugs, or dietary supplements you use. Also tell them if you smoke, drink alcohol, or use illegal drugs. Some items may interact with your medicine. What should I watch for while using this medicine? Check with your doctor or health care professional right away if you have any sign of an allergic reaction. What side effects may I notice from receiving this medicine? Side effects that you should report to your doctor or health care professional as soon as possible: -allergic reactions like skin rash, itching or hives, swelling of the face, lips or tongue -breathing problems -confusion -dizziness -fast or irregular heartbeat -feeling faint or lightheaded, falls -fever and chills -loss of balance or coordination -seizures -sweating -swelling of the hands or feet -tightness in the chest -tremors -unusually weak or tired Side effects that usually do not require medical attention (report to your doctor or health care professional if they continue or are bothersome): -constipation or diarrhea -headache This list may not describe all  possible side effects. Call your doctor for medical advice about side effects. You may report side effects to FDA at 1-800-FDA-1088. Where should I keep my medicine? Keep out of the reach of children. Store between 2 and 30 degrees C (36 and 86 degrees F). Throw away any unused medicine after the expiration date. NOTE: This sheet is a summary. It may not cover all possible information. If you have questions about this medicine, talk to your doctor, pharmacist, or health care provider.  2015, Elsevier/Gold Standard. (2013-03-27 16:27:45)  Acetaminophen; Oxycodone tablets What is this medicine? ACETAMINOPHEN; OXYCODONE (a set a MEE noe fen; ox i KOE done) is a pain reliever. It is used to treat mild to moderate pain. This medicine may be used for other purposes; ask your health care provider or pharmacist if you have questions. COMMON BRAND NAME(S): Endocet, Magnacet, Narvox, Percocet, Perloxx, Primalev, Primlev, Roxicet, Xolox What should I tell my health care provider before I take this medicine? They need to know if you have any of these conditions: -brain tumor -Crohn's disease, inflammatory bowel disease, or ulcerative colitis -drug abuse or addiction -head injury -heart or circulation problems -if you often drink alcohol -kidney disease or problems going to the bathroom -liver disease -lung disease, asthma, or breathing problems -an unusual or allergic reaction to acetaminophen, oxycodone, other opioid analgesics, other medicines, foods,  dyes, or preservatives -pregnant or trying to get pregnant -breast-feeding How should I use this medicine? Take this medicine by mouth with a full glass of water. Follow the directions on the prescription label. Take your medicine at regular intervals. Do not take your medicine more often than directed. Talk to your pediatrician regarding the use of this medicine in children. Special care may be needed. Patients over 63 years old may have a stronger  reaction and need a smaller dose. Overdosage: If you think you have taken too much of this medicine contact a poison control center or emergency room at once. NOTE: This medicine is only for you. Do not share this medicine with others. What if I miss a dose? If you miss a dose, take it as soon as you can. If it is almost time for your next dose, take only that dose. Do not take double or extra doses. What may interact with this medicine? -alcohol -antihistamines -barbiturates like amobarbital, butalbital, butabarbital, methohexital, pentobarbital, phenobarbital, thiopental, and secobarbital -benztropine -drugs for bladder problems like solifenacin, trospium, oxybutynin, tolterodine, hyoscyamine, and methscopolamine -drugs for breathing problems like ipratropium and tiotropium -drugs for certain stomach or intestine problems like propantheline, homatropine methylbromide, glycopyrrolate, atropine, belladonna, and dicyclomine -general anesthetics like etomidate, ketamine, nitrous oxide, propofol, desflurane, enflurane, halothane, isoflurane, and sevoflurane -medicines for depression, anxiety, or psychotic disturbances -medicines for sleep -muscle relaxants -naltrexone -narcotic medicines (opiates) for pain -phenothiazines like perphenazine, thioridazine, chlorpromazine, mesoridazine, fluphenazine, prochlorperazine, promazine, and trifluoperazine -scopolamine -tramadol -trihexyphenidyl This list may not describe all possible interactions. Give your health care provider a list of all the medicines, herbs, non-prescription drugs, or dietary supplements you use. Also tell them if you smoke, drink alcohol, or use illegal drugs. Some items may interact with your medicine. What should I watch for while using this medicine? Tell your doctor or health care professional if your pain does not go away, if it gets worse, or if you have new or a different type of pain. You may develop tolerance to the  medicine. Tolerance means that you will need a higher dose of the medication for pain relief. Tolerance is normal and is expected if you take this medicine for a long time. Do not suddenly stop taking your medicine because you may develop a severe reaction. Your body becomes used to the medicine. This does NOT mean you are addicted. Addiction is a behavior related to getting and using a drug for a non-medical reason. If you have pain, you have a medical reason to take pain medicine. Your doctor will tell you how much medicine to take. If your doctor wants you to stop the medicine, the dose will be slowly lowered over time to avoid any side effects. You may get drowsy or dizzy. Do not drive, use machinery, or do anything that needs mental alertness until you know how this medicine affects you. Do not stand or sit up quickly, especially if you are an older patient. This reduces the risk of dizzy or fainting spells. Alcohol may interfere with the effect of this medicine. Avoid alcoholic drinks. There are different types of narcotic medicines (opiates) for pain. If you take more than one type at the same time, you may have more side effects. Give your health care provider a list of all medicines you use. Your doctor will tell you how much medicine to take. Do not take more medicine than directed. Call emergency for help if you have problems breathing. The medicine will cause constipation. Try  to have a bowel movement at least every 2 to 3 days. If you do not have a bowel movement for 3 days, call your doctor or health care professional. Do not take Tylenol (acetaminophen) or medicines that have acetaminophen with this medicine. Too much acetaminophen can be very dangerous. Many nonprescription medicines contain acetaminophen. Always read the labels carefully to avoid taking more acetaminophen. What side effects may I notice from receiving this medicine? Side effects that you should report to your doctor or health  care professional as soon as possible: -allergic reactions like skin rash, itching or hives, swelling of the face, lips, or tongue -breathing difficulties, wheezing -confusion -light headedness or fainting spells -severe stomach pain -unusually weak or tired -yellowing of the skin or the whites of the eyes Side effects that usually do not require medical attention (report to your doctor or health care professional if they continue or are bothersome): -dizziness -drowsiness -nausea -vomiting This list may not describe all possible side effects. Call your doctor for medical advice about side effects. You may report side effects to FDA at 1-800-FDA-1088. Where should I keep my medicine? Keep out of the reach of children. This medicine can be abused. Keep your medicine in a safe place to protect it from theft. Do not share this medicine with anyone. Selling or giving away this medicine is dangerous and against the law. Store at room temperature between 20 and 25 degrees C (68 and 77 degrees F). Keep container tightly closed. Protect from light. This medicine may cause accidental overdose and death if it is taken by other adults, children, or pets. Flush any unused medicine down the toilet to reduce the chance of harm. Do not use the medicine after the expiration date. NOTE: This sheet is a summary. It may not cover all possible information. If you have questions about this medicine, talk to your doctor, pharmacist, or health care provider.  2015, Elsevier/Gold Standard. (2013-02-11 13:17:35)

## 2014-02-21 MED FILL — Ondansetron HCl Tab 4 MG: ORAL | Qty: 4 | Status: AC

## 2014-02-21 MED FILL — Oxycodone w/ Acetaminophen Tab 5-325 MG: ORAL | Qty: 6 | Status: AC

## 2014-03-31 ENCOUNTER — Other Ambulatory Visit: Payer: Self-pay | Admitting: *Deleted

## 2014-03-31 MED ORDER — METFORMIN HCL 500 MG PO TABS: 500.0000 mg | ORAL_TABLET | Freq: Every day | ORAL | Status: DC

## 2014-04-09 ENCOUNTER — Telehealth: Payer: Self-pay | Admitting: Family

## 2014-04-09 NOTE — Telephone Encounter (Signed)
appt scheduled for Monday with Paulene FloorMary Martin

## 2014-04-14 ENCOUNTER — Ambulatory Visit (INDEPENDENT_AMBULATORY_CARE_PROVIDER_SITE_OTHER): Payer: BC Managed Care – PPO | Admitting: Nurse Practitioner

## 2014-04-14 ENCOUNTER — Encounter: Payer: Self-pay | Admitting: Nurse Practitioner

## 2014-04-14 VITALS — BP 183/89 | HR 61 | Temp 98.1°F | Ht 65.5 in | Wt 227.2 lb

## 2014-04-14 DIAGNOSIS — R2231 Localized swelling, mass and lump, right upper limb: Secondary | ICD-10-CM

## 2014-04-14 NOTE — Progress Notes (Signed)
   Subjective:    Patient ID: Nancy Thornton, female    DOB: 1961/02/21, 53 y.o.   MRN: [id number]  HPI Patient in c/o palpable mass in right axillia. SHe had a palpable enlarged lymph node in left groin area several weeks ago but ot is getting smaller. Last mammmogram was about 2 years ago    Review of Systems  Constitutional: Negative.   HENT: Negative.   Respiratory: Negative.   Cardiovascular: Negative.   Genitourinary: Negative.   Neurological: Negative.   Psychiatric/Behavioral: Negative.        Objective:   Physical Exam  Constitutional: She is oriented to person, place, and time. She appears well-developed and well-nourished. No distress.  Cardiovascular: Normal rate, regular rhythm and normal heart sounds.   Pulmonary/Chest: Effort normal and breath sounds normal. Right breast exhibits mass (right axillia). Right breast exhibits no inverted nipple, no nipple discharge, no skin change and no tenderness. Left breast exhibits no inverted nipple, no mass, no nipple discharge, no skin change and no tenderness.    Neurological: She is alert and oriented to person, place, and time.  Skin: Skin is warm and dry.  Psychiatric: She has a normal mood and affect. Her behavior is normal. Judgment and thought content normal.    BP 183/89  Pulse 61  Temp(Src) 98.1 F (36.7 C) (Oral)  Ht 5' 5.5" (1.664 m)  Wt 227 lb 3.2 oz (103.057 kg)  BMI 37.22 kg/m2       Assessment & Plan:   1. Axillary mass, right    Orders Placed This Encounter  Procedures  . MM Digital Diagnostic Unilat R    Standing Status: Future     Number of Occurrences:      Standing Expiration Date: 06/15/2015    Order Specific Question:  Reason for Exam (SYMPTOM  OR DIAGNOSIS REQUIRED)    Answer:  right axillary mass    Order Specific Question:  Is the patient pregnant?    Answer:  No    Order Specific Question:  Preferred imaging location?    Answer:  External     Comments:  [facility]  . MM Digital  Screening Unilat L    Standing Status: Future     Number of Occurrences:      Standing Expiration Date: 06/15/2015    Order Specific Question:  Reason for Exam (SYMPTOM  OR DIAGNOSIS REQUIRED)    Answer:  screening    Order Specific Question:  Is the patient pregnant?    Answer:  No    Order Specific Question:  Preferred imaging location?    Answer:  External     Comments:  [facility]  . US Upper Ext Art Right    Standing Status: Future     Number of Occurrences:      Standing Expiration Date: 06/15/2015    Order Specific Question:  Reason for Exam (SYMPTOM  OR DIAGNOSIS REQUIRED)    Answer:  axillary mass    Order Specific Question:  Preferred imaging location?    Answer:  External     Comments:  [facility]   Keep check of area Follow up prn  Mary-Margaret Daphine DeutscherMartin, FNP

## 2014-05-06 ENCOUNTER — Other Ambulatory Visit (HOSPITAL_COMMUNITY): Payer: Self-pay

## 2014-05-06 ENCOUNTER — Ambulatory Visit (HOSPITAL_COMMUNITY)
Admission: RE | Admit: 2014-05-06 | Discharge: 2014-05-06 | Disposition: A | Discharge status: Home / Self Care | Payer: BC Managed Care – PPO | Source: Ambulatory Visit | Attending: Nurse Practitioner | Admitting: Nurse Practitioner

## 2014-05-06 ENCOUNTER — Other Ambulatory Visit: Payer: Self-pay | Admitting: Nurse Practitioner

## 2014-05-06 DIAGNOSIS — R2231 Localized swelling, mass and lump, right upper limb: Secondary | ICD-10-CM

## 2014-05-06 DIAGNOSIS — N63 Unspecified lump in breast: Secondary | ICD-10-CM | POA: Insufficient documentation

## 2014-05-23 ENCOUNTER — Ambulatory Visit (INDEPENDENT_AMBULATORY_CARE_PROVIDER_SITE_OTHER): Payer: BC Managed Care – PPO | Admitting: Family Medicine

## 2014-05-23 ENCOUNTER — Encounter: Payer: Self-pay | Admitting: Family Medicine

## 2014-05-23 VITALS — BP 151/83 | HR 64 | Temp 98.1°F | Ht 65.5 in | Wt 224.8 lb

## 2014-05-23 DIAGNOSIS — E785 Hyperlipidemia, unspecified: Secondary | ICD-10-CM

## 2014-05-23 DIAGNOSIS — J0120 Acute ethmoidal sinusitis, unspecified: Secondary | ICD-10-CM

## 2014-05-23 DIAGNOSIS — R350 Frequency of micturition: Secondary | ICD-10-CM

## 2014-05-23 DIAGNOSIS — E559 Vitamin D deficiency, unspecified: Secondary | ICD-10-CM

## 2014-05-23 DIAGNOSIS — I10 Essential (primary) hypertension: Secondary | ICD-10-CM

## 2014-05-23 DIAGNOSIS — E1140 Type 2 diabetes mellitus with diabetic neuropathy, unspecified: Secondary | ICD-10-CM

## 2014-05-23 LAB — POCT URINALYSIS DIPSTICK
Bilirubin, UA: NEGATIVE
Blood, UA: NEGATIVE
Glucose, UA: NEGATIVE
Ketones, UA: NEGATIVE
Leukocytes, UA: NEGATIVE
Nitrite, UA: NEGATIVE
Protein, UA: NEGATIVE
Spec Grav, UA: 1.01
Urobilinogen, UA: NEGATIVE
pH, UA: 6

## 2014-05-23 LAB — POCT GLYCOSYLATED HEMOGLOBIN (HGB A1C): Hemoglobin A1C: 6.3

## 2014-05-23 MED ORDER — DOXYCYCLINE HYCLATE 100 MG PO TABS: 100.0000 mg | ORAL_TABLET | Freq: Two times a day (BID) | ORAL | Status: DC

## 2014-05-23 NOTE — Progress Notes (Signed)
   Subjective:    Patient ID: Nancy Thornton, female    DOB: 09-16-1960, 53 y.o.   MRN: [id number]  HPI  53 year old female here to follow-up diabetes, hypertension, and hyperlipidemia, today she has an additional complaint which is pain in the left side of her face and maxillary sinus area and foul-smelling drainage. Regarding her blood pressure she did not pick up the diuretic that was called in at her last visit and has been off that part of her medication. In addition she is taking a decongestant for sinus and both of these factors may be influencing her blood pressure level today. She also ran out of her pravastatin and has been off of that for about 2 weeks citing need to take less medicine    Review of Systems  Constitutional: Negative.   HENT: Positive for facial swelling, rhinorrhea and sinus pressure.   Eyes: Negative.   Respiratory: Positive for wheezing.   Cardiovascular: Negative.   Gastrointestinal: Negative.   Endocrine: Negative.   Genitourinary: Negative.   Musculoskeletal: Positive for myalgias.       Bilateral thumb pain  Skin: Rash: not true rash but healing intertrigo.  Hematological: Negative.   Psychiatric/Behavioral: Negative.        Objective:   Physical Exam  Constitutional: She is oriented to person, place, and time. She appears well-developed and well-nourished.  HENT:  Head: Normocephalic.  Right Ear: External ear normal.  Left Ear: External ear normal.  Left maxillary sinus tenderness and decreased transillumination  Eyes: Conjunctivae and EOM are normal.  Neck: Normal range of motion. Neck supple.  Cardiovascular: Normal rate, regular rhythm and normal heart sounds.   Pulmonary/Chest: Effort normal. She has wheezes.  Abdominal: Soft. Bowel sounds are normal.  Musculoskeletal: Normal range of motion.  Neurological: She is alert and oriented to person, place, and time. She has normal reflexes.  Skin: Skin is warm and dry.  Psychiatric: She has a  normal mood and affect. Her behavior is normal. Thought content normal.    BP 151/83 mmHg  Pulse 64  Temp(Src) 98.1 F (36.7 C) (Oral)  Ht 5' 5.5" (1.664 m)  Wt 224 lb 12.8 oz (101.969 kg)  BMI 36.83 kg/m2      Assessment & Plan:  1. Essential hypertension Off diuretic  2. Type 2 diabetes mellitus with diabetic neuropathy  - POCT glycosylated hemoglobin (Hb A1C)  3. HLD (hyperlipidemia) Off statin - Lipid panel  4. Vitamin D deficiency Has been on therapeutic dose - Vit D  25 hydroxy (rtn osteoporosis monitoring)  5. Urinary frequency Check urine  6. Sinusitis Doxycycline, Mucinex  Frederica KusterStephen M Amelianna Meller MD

## 2014-05-24 LAB — LIPID PANEL
Chol/HDL Ratio: 3.5 ratio units (ref 0.0–4.4)
Cholesterol, Total: 166 mg/dL (ref 100–199)
HDL: 48 mg/dL (ref >39)
LDL Calculated: 77 mg/dL (ref 0–99)
Triglycerides: 205 mg/dL — ABNORMAL HIGH (ref 0–149)
VLDL Cholesterol Cal: 41 mg/dL — ABNORMAL HIGH (ref 5–40)

## 2014-05-24 LAB — VITAMIN D 25 HYDROXY (VIT D DEFICIENCY, FRACTURES): Vit D, 25-Hydroxy: 32 ng/mL (ref 30.0–100.0)

## 2014-05-26 ENCOUNTER — Telehealth: Payer: Self-pay | Admitting: Family Medicine

## 2014-05-26 NOTE — Telephone Encounter (Signed)
-----   Message from Stephen M Miller, MD sent at 05/26/2014  7:44 AM EST ----- Results of lab: Urinalysis is normal, hemoglobin A1c is at goal, lipids are okay even though the triglycerides and gone up slightly, the LDL or bad cholesterol is normal and good cholesterol HDL is also within the normal range, and vitamin D is now in the normal range and I would recommend 2000 units a day maintenance 

## 2014-05-27 NOTE — Telephone Encounter (Signed)
-----   Message from Frederica KusterStephen M Miller, MD sent at 05/26/2014  7:44 AM EST ----- Results of lab: Urinalysis is normal, hemoglobin A1c is at goal, lipids are okay even though the triglycerides and gone up slightly, the LDL or bad cholesterol is normal and good cholesterol HDL is also within the normal range, and vitamin D is now in the normal range and I would recommend 2000 units a day maintenance

## 2014-05-27 NOTE — Telephone Encounter (Signed)
Letter sent with results

## 2014-05-30 ENCOUNTER — Other Ambulatory Visit: Payer: Self-pay | Admitting: Nurse Practitioner

## 2014-07-22 ENCOUNTER — Encounter: Payer: Self-pay | Admitting: *Deleted

## 2014-09-01 ENCOUNTER — Other Ambulatory Visit: Payer: Self-pay | Admitting: *Deleted

## 2014-09-01 MED ORDER — METFORMIN HCL 500 MG PO TABS: ORAL_TABLET | ORAL | Status: DC

## 2014-10-27 ENCOUNTER — Ambulatory Visit: Payer: BLUE CROSS/BLUE SHIELD | Attending: Family Medicine | Admitting: Physical Therapy

## 2014-10-27 DIAGNOSIS — M545 Low back pain, unspecified: Secondary | ICD-10-CM

## 2014-10-27 DIAGNOSIS — S39012D Strain of muscle, fascia and tendon of lower back, subsequent encounter: Secondary | ICD-10-CM | POA: Insufficient documentation

## 2014-10-27 DIAGNOSIS — S39012A Strain of muscle, fascia and tendon of lower back, initial encounter: Secondary | ICD-10-CM

## 2014-10-27 DIAGNOSIS — X58XXXD Exposure to other specified factors, subsequent encounter: Secondary | ICD-10-CM | POA: Diagnosis not present

## 2014-10-27 NOTE — Therapy (Signed)
[facility] [address] [place], [place], [address] Phone: [phone]   Fax:  [phone]  Physical Therapy Evaluation  Patient Details  Name: Nancy Thornton MRN: [id number] Date of Birth: 1960-07-14 Referring Provider:  Ernestina Penna, MD  Encounter Date: 10/27/2014      PT End of Session - 10/27/14 1513    Visit Number 1   Number of Visits 12   Date for PT Re-Evaluation 12/08/14   PT Start Time 0235   PT Stop Time 0328   PT Time Calculation (min) 53 min   Activity Tolerance Patient tolerated treatment well   Behavior During Therapy [facility] for tasks assessed/performed      Past Medical History  Diagnosis Date  . Diabetes mellitus without complication   . Hypertension   . Anal fissure     history of  . Obesity   . Asthma   . Depression   . Substance abuse     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    Past Surgical History  Procedure Laterality Date  . Cholecystectomy    . Abdominal hysterectomy    . Tonsillectomy    . Cystostomy w/ bladder biopsy  1990    punctured hole in bladder, had open procedure to repair    There were no vitals filed for this visit.  Visit Diagnosis:  Right-sided low back pain without sciatica - Plan: PT plan of care cert/re-cert  Low back strain, initial encounter - Plan: PT plan of care cert/re-cert          [facility] PT Assessment - 10/27/14 0001    Assessment   Medical Diagnosis Low abck strain.   Onset Date --  October 07, 2014.   Next MD Visit --  10/31/14.   Precautions   Precautions None   Balance Screen   Has the patient fallen in the past 6 months No   Has the patient had a decrease in activity level because of a fear of falling?  No   Is the patient reluctant to leave their home because of a fear of falling?  No   Posture/Postural Control   Posture Comments Generally WFL.   ROM / Strength   AROM / PROM / Strength --  Normal lumbar ROM.  Normal bil LE strength.   Palpation   Palpation --  Tender to palpation over right SIJ.   Special Tests    Special Tests --  Normal LE DTR's. (-) SLR and FABER. =leg lengths.   Ambulation/Gait   Ambulation/Gait --  WNL.                   [facility] Adult PT Treatment/Exercise - 10/27/14 0001    Modalities   Modalities Electrical Stimulation   Electrical Stimulation   Electrical Stimulation Location Right SIJ   Electrical Stimulation Action 80-150 HZ x 15 minutes   Electrical Stimulation Goals Pain                     PT Long Term Goals - 10/27/14 1503    PT LONG TERM GOAL #1   Title Ind with HEP.   Time 6   Period Weeks   Status New   PT LONG TERM GOAL #2   Title Perform ADL's with pain not > 2-3/10.   Time 6   Period Weeks   Status New   PT LONG TERM GOAL #3   Title Transitory movements with pain not > 2-3/10.  Plan - 10/27/14 1436    Clinical Impression Statement On 10/07/14 was sliding a box off an overhead shelf at work.  Patient was surprised at how heavy the box was. She then twisted and turn her body left and laid the box down.  Felt something in right low back.  Pain at a 5-6/10 today and can rise to 7+/10 after staying still too long and then moving again.     Pt will benefit from skilled therapeutic intervention in order to improve on the following deficits Decreased activity tolerance;Pain   Rehab Potential Excellent   PT Frequency 2x / week   PT Duration 6 weeks   PT Treatment/Interventions Electrical Stimulation;Therapeutic exercise;Patient/family education;Manual techniques;Ultrasound;Therapeutic activities   PT Next Visit Plan Modalities---Combo to right SIJ; STW/M.  Back stabilization exercises.         Problem List Patient Active Problem List   Diagnosis Date Noted  . Hearing loss secondary to cerumen impaction 10/11/2013  . Neuropathy in diabetes 04/18/2013  . Need for prophylactic vaccination and inoculation against influenza  04/18/2013  . Special screening for malignant neoplasms, colon 11/14/2012  . HTN (hypertension) 10/19/2012  . Other dysphagia 10/19/2012  . DM (diabetes mellitus) 10/19/2012  . HLD (hyperlipidemia) 10/19/2012    Jakobe Blau, [place] MPT 10/27/2014, 3:28 PM  [facility] [address] [place], [place], [address] Phone: [phone]   Fax:  [phone]

## 2014-10-28 ENCOUNTER — Encounter: Payer: Self-pay | Admitting: Nurse Practitioner

## 2014-10-28 ENCOUNTER — Ambulatory Visit (INDEPENDENT_AMBULATORY_CARE_PROVIDER_SITE_OTHER): Payer: BLUE CROSS/BLUE SHIELD | Admitting: Nurse Practitioner

## 2014-10-28 ENCOUNTER — Telehealth: Payer: Self-pay | Admitting: Family Medicine

## 2014-10-28 VITALS — BP 134/83 | HR 63 | Temp 97.4°F | Ht 65.5 in | Wt 223.2 lb

## 2014-10-28 DIAGNOSIS — J449 Chronic obstructive pulmonary disease, unspecified: Secondary | ICD-10-CM | POA: Diagnosis not present

## 2014-10-28 DIAGNOSIS — J410 Simple chronic bronchitis: Secondary | ICD-10-CM | POA: Insufficient documentation

## 2014-10-28 DIAGNOSIS — R062 Wheezing: Secondary | ICD-10-CM | POA: Diagnosis not present

## 2014-10-28 DIAGNOSIS — IMO0001 Reserved for inherently not codable concepts without codable children: Secondary | ICD-10-CM

## 2014-10-28 MED ORDER — LEVALBUTEROL HCL 1.25 MG/3ML IN NEBU
1.2500 mg | INHALATION_SOLUTION | RESPIRATORY_TRACT | Status: AC
  Administered 2014-10-28: 1.25 mg via RESPIRATORY_TRACT

## 2014-10-28 MED ORDER — BUDESONIDE-FORMOTEROL FUMARATE 160-4.5 MCG/ACT IN AERO: 2.0000 | INHALATION_SPRAY | Freq: Two times a day (BID) | RESPIRATORY_TRACT | Status: DC

## 2014-10-28 NOTE — Telephone Encounter (Signed)
Appointment given for 10:15 with Mary Martin, FNP 

## 2014-10-28 NOTE — Progress Notes (Signed)
   Subjective:    Patient ID: Nancy Thornton, female    DOB: [birth year], 54 y.o.   MRN: [id number]  HPI Patient in today c/o wheezing.Started several days ago. Cough is non productive. Experiencing SOB. She is a smoker. Has not been officially told that she has COPD. Is on symbicort daily. Saw Dr.Moore a week ago and was put on prednisone for wheezing. Not much better. Has been exposed to pneumonia since seeing Dr. Christell ConstantMoore.    Review of Systems  Constitutional: Negative.  Negative for fever.  HENT: Negative.   Respiratory: Positive for cough, shortness of breath and wheezing.   Cardiovascular: Negative.   Gastrointestinal: Negative.   Genitourinary: Negative.   Neurological: Negative.   Psychiatric/Behavioral: Negative.   All other systems reviewed and are negative.      Objective:   Physical Exam  Constitutional: She is oriented to person, place, and time. She appears well-developed and well-nourished.  HENT:  Right Ear: External ear normal.  Left Ear: External ear normal.  Nose: Nose normal.  Mouth/Throat: Oropharynx is clear and moist.  Eyes: Pupils are equal, round, and reactive to light.  Neck: Normal range of motion. Neck supple.  Cardiovascular: Normal rate, regular rhythm and normal heart sounds.   Pulmonary/Chest: Effort normal. She has wheezes (insp and exp bil).  Neurological: She is alert and oriented to person, place, and time.  Skin: Skin is warm and dry.  Psychiatric: She has a normal mood and affect. Her behavior is normal. Judgment and thought content normal.   BP 134/83 mmHg  Pulse 63  Temp(Src) 97.4 F (36.3 C) (Oral)  Ht 5' 5.5" (1.664 m)  Wt 223 lb 4 oz (101.266 kg)  BMI 36.57 kg/m2   S/P- looser cough- exp wheezes only-Mary-Margaret Daphine DeutscherMartin, FNP      Assessment & Plan:  1. Wheezing inceased symbicort dose to 160/4.5- 2 puff BID STOP smoking ALbuterol HFA Q 4 as needed Finish prednisone mucinex OTC Needs PFT when not sick RTO prn -  levalbuterol (XOPENEX) nebulizer solution 1.25 mg; Take 1.25 mg by nebulization now.  Mary-Margaret Daphine DeutscherMartin, FNP

## 2014-10-28 NOTE — Patient Instructions (Signed)
Smoking Cessation Quitting smoking is important to your health and has many advantages. However, it is not always easy to quit since nicotine is a very addictive drug. Oftentimes, people try 3 times or more before being able to quit. This document explains the best ways for you to prepare to quit smoking. Quitting takes hard work and a lot of effort, but you can do it. ADVANTAGES OF QUITTING SMOKING  You will live longer, feel better, and live better.  Your body will feel the impact of quitting smoking almost immediately.  Within 20 minutes, blood pressure decreases. Your pulse returns to its normal level.  After 8 hours, carbon monoxide levels in the blood return to normal. Your oxygen level increases.  After 24 hours, the chance of having a heart attack starts to decrease. Your breath, hair, and body stop smelling like smoke.  After 48 hours, damaged nerve endings begin to recover. Your sense of taste and smell improve.  After 72 hours, the body is virtually free of nicotine. Your bronchial tubes relax and breathing becomes easier.  After 2 to 12 weeks, lungs can hold more air. Exercise becomes easier and circulation improves.  The risk of having a heart attack, stroke, cancer, or lung disease is greatly reduced.  After 1 year, the risk of coronary heart disease is cut in half.  After 5 years, the risk of stroke falls to the same as a nonsmoker.  After 10 years, the risk of lung cancer is cut in half and the risk of other cancers decreases significantly.  After 15 years, the risk of coronary heart disease drops, usually to the level of a nonsmoker.  If you are pregnant, quitting smoking will improve your chances of having a healthy baby.  The people you live with, especially any children, will be healthier.  You will have extra money to spend on things other than cigarettes. QUESTIONS TO THINK ABOUT BEFORE ATTEMPTING TO QUIT You may want to talk about your answers with your  health care provider.  Why do you want to quit?  If you tried to quit in the past, what helped and what did not?  What will be the most difficult situations for you after you quit? How will you plan to handle them?  Who can help you through the tough times? Your family? Friends? A health care provider?  What pleasures do you get from smoking? What ways can you still get pleasure if you quit? Here are some questions to ask your health care provider:  How can you help me to be successful at quitting?  What medicine do you think would be best for me and how should I take it?  What should I do if I need more help?  What is smoking withdrawal like? How can I get information on withdrawal? GET READY  Set a quit date.  Change your environment by getting rid of all cigarettes, ashtrays, matches, and lighters in your home, car, or work. Do not let people smoke in your home.  Review your past attempts to quit. Think about what worked and what did not. GET SUPPORT AND ENCOURAGEMENT You have a better chance of being successful if you have help. You can get support in many ways.  Tell your family, friends, and coworkers that you are going to quit and need their support. Ask them not to smoke around you.  Get individual, group, or telephone counseling and support. Programs are available at local hospitals and health centers. Call   your local health department for information about programs in your area.  Spiritual beliefs and practices may help some smokers quit.  Download a "quit meter" on your computer to keep track of quit statistics, such as how long you have gone without smoking, cigarettes not smoked, and money saved.  Get a self-help book about quitting smoking and staying off tobacco. LEARN NEW SKILLS AND BEHAVIORS  Distract yourself from urges to smoke. Talk to someone, go for a walk, or occupy your time with a task.  Change your normal routine. Take a different route to work.  Drink tea instead of coffee. Eat breakfast in a different place.  Reduce your stress. Take a hot bath, exercise, or read a book.  Plan something enjoyable to do every day. Reward yourself for not smoking.  Explore interactive web-based programs that specialize in helping you quit. GET MEDICINE AND USE IT CORRECTLY Medicines can help you stop smoking and decrease the urge to smoke. Combining medicine with the above behavioral methods and support can greatly increase your chances of successfully quitting smoking.  Nicotine replacement therapy helps deliver nicotine to your body without the negative effects and risks of smoking. Nicotine replacement therapy includes nicotine gum, lozenges, inhalers, nasal sprays, and skin patches. Some may be available over-the-counter and others require a prescription.  Antidepressant medicine helps people abstain from smoking, but how this works is unknown. This medicine is available by prescription.  Nicotinic receptor partial agonist medicine simulates the effect of nicotine in your brain. This medicine is available by prescription. Ask your health care provider for advice about which medicines to use and how to use them based on your health history. Your health care provider will tell you what side effects to look out for if you choose to be on a medicine or therapy. Carefully read the information on the package. Do not use any other product containing nicotine while using a nicotine replacement product.  RELAPSE OR DIFFICULT SITUATIONS Most relapses occur within the first 3 months after quitting. Do not be discouraged if you start smoking again. Remember, most people try several times before finally quitting. You may have symptoms of withdrawal because your body is used to nicotine. You may crave cigarettes, be irritable, feel very hungry, cough often, get headaches, or have difficulty concentrating. The withdrawal symptoms are only temporary. They are strongest  when you first quit, but they will go away within 10-14 days. To reduce the chances of relapse, try to:  Avoid drinking alcohol. Drinking lowers your chances of successfully quitting.  Reduce the amount of caffeine you consume. Once you quit smoking, the amount of caffeine in your body increases and can give you symptoms, such as a rapid heartbeat, sweating, and anxiety.  Avoid smokers because they can make you want to smoke.  Do not let weight gain distract you. Many smokers will gain weight when they quit, usually less than 10 pounds. Eat a healthy diet and stay active. You can always lose the weight gained after you quit.  Find ways to improve your mood other than smoking. FOR MORE INFORMATION  www.smokefree.gov  Document Released: 06/14/2001 Document Revised: 11/04/2013 Document Reviewed: 09/29/2011 ExitCare Patient Information 2015 [employer]. This information is not intended to replace advice given to you by your health care provider. Make sure you discuss any questions you have with your health care provider.  

## 2014-10-29 ENCOUNTER — Ambulatory Visit: Payer: BLUE CROSS/BLUE SHIELD | Admitting: Physical Therapy

## 2014-10-29 DIAGNOSIS — M545 Low back pain, unspecified: Secondary | ICD-10-CM

## 2014-10-29 DIAGNOSIS — S39012A Strain of muscle, fascia and tendon of lower back, initial encounter: Secondary | ICD-10-CM

## 2014-10-29 DIAGNOSIS — S39012D Strain of muscle, fascia and tendon of lower back, subsequent encounter: Secondary | ICD-10-CM | POA: Diagnosis not present

## 2014-10-29 NOTE — Therapy (Signed)
[facility] [address] [place], [place], [address] Phone: [phone]   Fax:  [phone]  Physical Therapy Treatment  Patient Details  Name: Nancy LeashDrusilla Thornton MRN: [id number] Date of Birth: 1961/05/31 Referring Provider:  Frederica KusterMiller, Stephen M, MD  Encounter Date: 10/29/2014      PT End of Session - 10/29/14 1357    Visit Number 2   PT Start Time 1252   PT Stop Time 1352   PT Time Calculation (min) 60 min      Past Medical History  Diagnosis Date  . Diabetes mellitus without complication   . Hypertension   . Anal fissure     history of  . Obesity   . Asthma   . Depression   . Substance abuse     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    Past Surgical History  Procedure Laterality Date  . Cholecystectomy    . Abdominal hysterectomy    . Tonsillectomy    . Cystostomy w/ bladder biopsy  1990    punctured hole in bladder, had open procedure to repair    There were no vitals filed for this visit.  Visit Diagnosis:  Right-sided low back pain without sciatica  Low back strain, initial encounter      Subjective Assessment - 10/29/14 1258    Subjective Pain at a 3/10 today.  Still on steroids though.                         [facility] Adult PT Treatment/Exercise - 10/29/14 1259    Modalities   Modalities Ultrasound  x 12 minutes at 1.50 W/cm2 to right SIJ region in [facility].   Programme researcher, broadcasting/film/videolectrical Stimulation   Electrical Stimulation Location Right SIJ   Electrical Stimulation Action --  80-150 Hz x 15 minutes.   Electrical Stimulation Goals Pain   Manual Therapy   Manual Therapy Myofascial release   Myofascial Release STW/M x 12 minutes to right sacral ligaments region.                     PT Long Term Goals - 10/27/14 1503    PT LONG TERM GOAL #1   Title Ind with HEP.   Time 6   Period Weeks   Status New   PT LONG TERM GOAL #2   Title Perform ADL's with pain not > 2-3/10.   Time 6   Period  Weeks   Status New   PT LONG TERM GOAL #3   Title Transitory movements with pain not > 2-3/10.               Problem List Patient Active Problem List   Diagnosis Date Noted  . 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 Neuropathy in diabetes 04/18/2013  . HTN (hypertension) 10/19/2012  . DM (diabetes mellitus) 10/19/2012  . HLD (hyperlipidemia) 10/19/2012    Giana Castner, [place] MPT 10/29/2014, 1:58 PM  [facility] [address] [place], [place], [address] Phone: [phone]   Fax:  [phone]

## 2014-11-03 ENCOUNTER — Ambulatory Visit: Payer: Worker's Compensation | Attending: Family Medicine | Admitting: Physical Therapy

## 2014-11-03 ENCOUNTER — Encounter: Payer: Self-pay | Admitting: Physical Therapy

## 2014-11-03 DIAGNOSIS — M545 Low back pain, unspecified: Secondary | ICD-10-CM

## 2014-11-03 DIAGNOSIS — S39012A Strain of muscle, fascia and tendon of lower back, initial encounter: Secondary | ICD-10-CM

## 2014-11-03 DIAGNOSIS — S39012D Strain of muscle, fascia and tendon of lower back, subsequent encounter: Secondary | ICD-10-CM | POA: Insufficient documentation

## 2014-11-03 NOTE — Patient Instructions (Signed)
Pelvic Tilt: Posterior - Legs Bent (Supine)   Tighten stomach and flatten back by rolling pelvis down. Hold _10___ seconds. Relax. Repeat _10-30___ times per set. Do __2__ sets per session. Do _2___ sessions per day.  http://orth.exer.us/202   Copyright  VHI. All rights reserved.  Bridging   Slowly raise buttocks from floor, keeping stomach tight. Repeat _10___ times per set. Do __2__ sets per session. Do __2__ sessions per day.  Brushing Teeth    Place one foot on ledge and one hand on counter. Bend other knee slightly to keep back straight.  Copyright  VHI. All rights reserved.  Refrigerator   Squat with knees apart to reach lower shelves and drawers.   Housework - Vacuuming   Hold the vacuum with arm held at side. Step back and forth to move it, keeping head up. Avoid twisting.

## 2014-11-03 NOTE — Therapy (Signed)
[facility] [address] [place], [place], [address] Phone: [phone]   Fax:  [phone]  Physical Therapy Treatment  Patient Details  Name: Nancy LeashDrusilla Marsteller MRN: [id number] Date of Birth: Nov 26, 1960 Referring Provider:  Bennie PieriniMartin, Mary-Margaret, *  Encounter Date: 11/03/2014      PT End of Session - 11/03/14 1315    Visit Number 3   Number of Visits 12   Date for PT Re-Evaluation 12/08/14   PT Start Time 1229   PT Stop Time 1315   PT Time Calculation (min) 46 min   Activity Tolerance Patient tolerated treatment well   Behavior During Therapy [facility] for tasks assessed/performed      Past Medical History  Diagnosis Date  . Diabetes mellitus without complication   . Hypertension   . Anal fissure     history of  . Obesity   . Asthma   . Depression   . Substance abuse     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    Past Surgical History  Procedure Laterality Date  . Cholecystectomy    . Abdominal hysterectomy    . Tonsillectomy    . Cystostomy w/ bladder biopsy  1990    punctured hole in bladder, had open procedure to repair    There were no vitals filed for this visit.  Visit Diagnosis:  Right-sided low back pain without sciatica  Low back strain, initial encounter      Subjective Assessment - 11/03/14 1241    Subjective pain is better overall as she has not been at work yet still painful   Currently in Pain? Yes   Pain Score 4    Pain Location Back   Pain Orientation Right   Pain Descriptors / Indicators Sore   Pain Type Acute pain   Pain Onset 1 to 4 weeks ago   Aggravating Factors  increased activity   Pain Relieving Factors rest                         [facility] Adult PT Treatment/Exercise - 11/03/14 0001    Modalities   Modalities Electrical Stimulation;Moist Heat;Ultrasound   Moist Heat Therapy   Number Minutes Moist Heat 15 Minutes   Moist Heat Location --  back   Electrical Stimulation    Electrical Stimulation Location Right SIJ   Electrical Stimulation Action 80-150HZ    Electrical Stimulation Parameters premod   Electrical Stimulation Goals Pain   Ultrasound   Ultrasound Location right SIJ area   Ultrasound Parameters 1.5w/cm2/50%/631mhzx10min   Ultrasound Goals Pain   Manual Therapy   Manual Therapy Myofascial release   Myofascial Release STW to right SIJ area                PT Education - 11/03/14 1252    Education provided Yes   Education Details HEP posture /draw in(core activation)   Person(s) Educated Patient   Methods Explanation;Demonstration;Handout   Comprehension Verbalized understanding;Returned demonstration             PT Long Term Goals - 11/03/14 1317    PT LONG TERM GOAL #1   Title Ind with HEP.   Time 6   Period Weeks   Status On-going   PT LONG TERM GOAL #2   Title Perform ADL's with pain not > 2-3/10.   Time 6   Period Weeks   Status On-going   PT LONG TERM GOAL #3   Title Transitory movements with pain  not > 2-3/10.   Time 6   Period Weeks   Status On-going               Plan - 11/03/14 1315    Clinical Impression Statement patient tolerated tx well today, had palpable pain in right SIJ area. patient understands posture and core activation techniques t protect back from re-injury. goals ongoing.   Pt will benefit from skilled therapeutic intervention in order to improve on the following deficits Decreased activity tolerance;Pain   Rehab Potential Excellent   PT Frequency 2x / week   PT Duration 6 weeks   PT Treatment/Interventions Electrical Stimulation;Therapeutic exercise;Patient/family education;Manual techniques;Ultrasound;Therapeutic activities   PT Next Visit Plan cont with POC, may try SKTC/piriformis stretch   Consulted and Agree with Plan of Care Patient        Problem List Patient Active Problem List   Diagnosis Date Noted  . 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 Neuropathy in diabetes 04/18/2013   . HTN (hypertension) 10/19/2012  . DM (diabetes mellitus) 10/19/2012  . HLD (hyperlipidemia) 10/19/2012    Dara Camargo P, PTA 11/03/2014, 1:18 PM  [facility] [address] [place], [place], [address] Phone: [phone]   Fax:  [phone]

## 2014-11-05 ENCOUNTER — Encounter: Payer: Self-pay | Admitting: Physical Therapy

## 2014-11-05 ENCOUNTER — Telehealth: Payer: Self-pay | Admitting: Nurse Practitioner

## 2014-11-05 NOTE — Telephone Encounter (Signed)
This is to be addressed on w/c chart - not in EPIC

## 2014-11-10 ENCOUNTER — Telehealth: Payer: Self-pay | Admitting: *Deleted

## 2014-11-10 DIAGNOSIS — S39012S Strain of muscle, fascia and tendon of lower back, sequela: Secondary | ICD-10-CM

## 2014-11-10 DIAGNOSIS — M545 Low back pain: Secondary | ICD-10-CM

## 2014-11-10 NOTE — Telephone Encounter (Signed)
Pt's workers comp has denied her claim and denied covering an MRI of her LUMBAR.  Pt still wants this done and she wants it done being filled through her private insurance.   <I have ordered it and this is FYI>  Note: the W/C file will be scanned to Epic soon - [place] billing

## 2014-11-12 ENCOUNTER — Telehealth: Payer: Self-pay

## 2014-11-12 NOTE — Telephone Encounter (Signed)
Patient knows that her WC was denied and wants to have the MRI using her insurance

## 2014-11-12 NOTE — Telephone Encounter (Signed)
It is already ordered on 5/9 - just need to schedule

## 2014-11-14 ENCOUNTER — Encounter: Payer: Self-pay | Admitting: *Deleted

## 2014-11-14 ENCOUNTER — Encounter: Payer: Self-pay | Admitting: Family Medicine

## 2014-11-14 ENCOUNTER — Encounter: Payer: BLUE CROSS/BLUE SHIELD | Admitting: Family Medicine

## 2014-11-17 ENCOUNTER — Encounter: Payer: Self-pay | Admitting: *Deleted

## 2014-11-17 NOTE — Progress Notes (Signed)
This encounter was created in error - please disregard.

## 2014-11-19 ENCOUNTER — Ambulatory Visit: Payer: BC Managed Care – PPO | Admitting: Family Medicine

## 2014-11-20 ENCOUNTER — Ambulatory Visit (HOSPITAL_COMMUNITY)
Admission: RE | Admit: 2014-11-20 | Discharge: 2014-11-20 | Disposition: A | Discharge status: Home / Self Care | Payer: BLUE CROSS/BLUE SHIELD | Source: Ambulatory Visit | Attending: Family Medicine | Admitting: Family Medicine

## 2014-11-20 DIAGNOSIS — M5126 Other intervertebral disc displacement, lumbar region: Secondary | ICD-10-CM | POA: Diagnosis not present

## 2014-11-20 DIAGNOSIS — M47816 Spondylosis without myelopathy or radiculopathy, lumbar region: Secondary | ICD-10-CM | POA: Insufficient documentation

## 2014-11-20 DIAGNOSIS — M545 Low back pain: Secondary | ICD-10-CM | POA: Insufficient documentation

## 2014-11-20 DIAGNOSIS — M4806 Spinal stenosis, lumbar region: Secondary | ICD-10-CM | POA: Diagnosis not present

## 2014-11-20 DIAGNOSIS — S39012S Strain of muscle, fascia and tendon of lower back, sequela: Secondary | ICD-10-CM

## 2014-11-21 ENCOUNTER — Ambulatory Visit (INDEPENDENT_AMBULATORY_CARE_PROVIDER_SITE_OTHER): Payer: BLUE CROSS/BLUE SHIELD | Admitting: Family Medicine

## 2014-11-21 ENCOUNTER — Encounter: Payer: Self-pay | Admitting: Family Medicine

## 2014-11-21 VITALS — BP 143/94 | HR 70 | Temp 97.6°F | Ht 65.5 in | Wt 225.8 lb

## 2014-11-21 DIAGNOSIS — I10 Essential (primary) hypertension: Secondary | ICD-10-CM

## 2014-11-21 DIAGNOSIS — E1140 Type 2 diabetes mellitus with diabetic neuropathy, unspecified: Secondary | ICD-10-CM | POA: Diagnosis not present

## 2014-11-21 DIAGNOSIS — E785 Hyperlipidemia, unspecified: Secondary | ICD-10-CM | POA: Diagnosis not present

## 2014-11-21 LAB — POCT GLYCOSYLATED HEMOGLOBIN (HGB A1C): Hemoglobin A1C: 6.3

## 2014-11-21 NOTE — Patient Instructions (Signed)
Continue current medications. Continue good therapeutic lifestyle changes which include good diet and exercise. Fall precautions discussed with patient. If an FOBT was given today- please return it to our front desk. If you are over 54 years old - you may need Prevnar 13 or the adult Pneumonia vaccine.   After your visit with us today you will receive a survey in the mail or online from [employer] regarding your care with us. Please take a moment to fill this out. Your feedback is very important to us as you can help us better understand your patient needs as well as improve your experience and satisfaction. WE CARE ABOUT YOU!!!    

## 2014-11-21 NOTE — Progress Notes (Signed)
Subjective:    Patient ID: Nancy Thornton, female    DOB: [birth year], 54 y.o.   MRN: [id number]  HPI  Pt is here today for a follow up of chronic medical problems which include diabetes, hypertension and hyperlipidemia. She has been not been checking her sugars at home but I get the sense of diabetes is really pretty well managed because her last A1c was 6.3. On the other hand, her blood pressure is elevated today but there are some other stresses that are influencing her blood pressure she tells me. Also, she has not been taking her pravastatin. Her last LDL in November 2015 was 30 which is great. Her triglycerides are moderately elevated and she takes fish oil occasionally for that.  Her main concern today is pending results of her MRI. Apparently she had an injury at work resulting in back pain. It has persisted and now she has had an MRI which basically shows disc bulging at several levels but at L4-5 there is a nerve root impingement. This is consistent with her pain in her right hip and leg thank.         Patient Active Problem List   Diagnosis Date Noted  . 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 Neuropathy in diabetes 04/18/2013  . HTN (hypertension) 10/19/2012  . DM (diabetes mellitus) 10/19/2012  . HLD (hyperlipidemia) 10/19/2012   Outpatient Encounter Prescriptions as of 11/21/2014  Medication Sig  . albuterol (PROAIR HFA) 108 (90 BASE) MCG/ACT inhaler Inhale 2 puffs into the lungs every 6 (six) hours as needed for wheezing or shortness of breath.  Marland Kitchen atenolol (TENORMIN) 50 MG tablet Take 1 tablet (50 mg total) by mouth daily.  . budesonide-formoterol (SYMBICORT) 160-4.5 MCG/ACT inhaler Inhale 2 puffs into the lungs 2 (two) times daily.  . Cyanocobalamin (VITAMIN B 12 PO) Take by mouth.  . hydrochlorothiazide (HYDRODIURIL) 25 MG tablet Take 1 tablet (25 mg total) by mouth daily.  Marland Kitchen lisinopril (PRINIVIL,ZESTRIL) 20 MG tablet Take 1 tablet (20 mg total) by mouth daily.  . metFORMIN  (GLUCOPHAGE) 500 MG tablet TAKE ONE TABLET BY MOUTH ONCE DAILY WITH  BREAKFAST  . Omega-3 Fatty Acids (FISH OIL PO) Take by mouth.  . pravastatin (PRAVACHOL) 20 MG tablet Take 1 tablet (20 mg total) by mouth daily.  . [DISCONTINUED] Vitamin D, Ergocalciferol, (DRISDOL) 50000 UNITS CAPS capsule Take 1 capsule (50,000 Units total) by mouth every 7 (seven) days. (Patient not taking: Reported on 11/21/2014)   No facility-administered encounter medications on file as of 11/21/2014.      Review of Systems     Objective:   Physical Exam  Constitutional: She is oriented to person, place, and time. She appears well-developed and well-nourished.  Neck: Normal range of motion.  Cardiovascular: Normal rate and regular rhythm.   Pulmonary/Chest: Effort normal. She has wheezes.  Neurological: She is alert and oriented to person, place, and time.  Psychiatric: She has a normal mood and affect.   BP 143/94 mmHg  Pulse 70  Temp(Src) 97.6 F (36.4 C) (Oral)  Ht 5' 5.5" (1.664 m)  Wt 225 lb 12.8 oz (102.422 kg)  BMI 36.99 kg/m2        Assessment & Plan:  1. Type 2 diabetes mellitus with diabetic neuropathy Diabetes currently treated with metformin. She denies any polyuria polydipsia polyphagia. She denies any symptoms consistent with neuropathy today - POCT glycosylated hemoglobin (Hb A1C) - CMP14+EGFR  2. HLD (hyperlipidemia) Check lipids. Patient has not been taking medicine that I would  expect there has been some elevation of her LDL - Lipid panel  3. Essential hypertension Blood pressure elevated probably situationally. For the time being continue on with atenolol hydrochlorothiazide and lisinopril. I did recommend that she take either the atenolol or lisinopril 12 hours after the other so she takes 1 in the morning and 1 at night and hydrochlorothiazide continuing in the morning  Wardell Honour MD - [phone]

## 2014-11-22 LAB — CMP14+EGFR
ALT: 24 IU/L (ref 0–32)
AST: 21 IU/L (ref 0–40)
Albumin/Globulin Ratio: 2.4 (ref 1.1–2.5)
Albumin: 4.5 g/dL (ref 3.5–5.5)
Alkaline Phosphatase: 61 IU/L (ref 39–117)
BUN/Creatinine Ratio: 30 — ABNORMAL HIGH (ref 9–23)
BUN: 17 mg/dL (ref 6–24)
Bilirubin Total: 0.6 mg/dL (ref 0.0–1.2)
CO2: 26 mmol/L (ref 18–29)
Calcium: 9.9 mg/dL (ref 8.7–10.2)
Chloride: 99 mmol/L (ref 97–108)
Creatinine, Ser: 0.57 mg/dL (ref 0.57–1.00)
GFR calc Af Amer: 122 mL/min/{1.73_m2} (ref >59)
GFR calc non Af Amer: 106 mL/min/{1.73_m2} (ref >59)
Globulin, Total: 1.9 g/dL (ref 1.5–4.5)
Glucose: 106 mg/dL — ABNORMAL HIGH (ref 65–99)
Potassium: 4.5 mmol/L (ref 3.5–5.2)
Sodium: 143 mmol/L (ref 134–144)
Total Protein: 6.4 g/dL (ref 6.0–8.5)

## 2014-11-22 LAB — LIPID PANEL
Chol/HDL Ratio: 3.1 ratio units (ref 0.0–4.4)
Cholesterol, Total: 216 mg/dL — ABNORMAL HIGH (ref 100–199)
HDL: 69 mg/dL (ref >39)
LDL Calculated: 110 mg/dL — ABNORMAL HIGH (ref 0–99)
Triglycerides: 183 mg/dL — ABNORMAL HIGH (ref 0–149)
VLDL Cholesterol Cal: 37 mg/dL (ref 5–40)

## 2014-11-24 ENCOUNTER — Telehealth: Payer: Self-pay | Admitting: *Deleted

## 2014-11-24 ENCOUNTER — Encounter: Payer: Self-pay | Admitting: *Deleted

## 2014-11-24 DIAGNOSIS — M47816 Spondylosis without myelopathy or radiculopathy, lumbar region: Secondary | ICD-10-CM

## 2014-11-24 DIAGNOSIS — M5416 Radiculopathy, lumbar region: Secondary | ICD-10-CM

## 2014-11-24 DIAGNOSIS — M5126 Other intervertebral disc displacement, lumbar region: Secondary | ICD-10-CM

## 2014-11-24 DIAGNOSIS — M5136 Other intervertebral disc degeneration, lumbar region: Secondary | ICD-10-CM

## 2014-11-24 NOTE — Telephone Encounter (Signed)
-----   Message from Ernestina Pennaonald W Moore, MD sent at 11/21/2014  9:45 PM EDT ----- As per radiology report----please give full results of the patient and schedule her to see the neurosurgeon and have her come in for me to review the results

## 2014-11-24 NOTE — Telephone Encounter (Signed)
Lm 5/23- jhb  Needs to see DWM - please have her Talk with Asher MuirJamie B.

## 2014-11-27 ENCOUNTER — Other Ambulatory Visit: Payer: Self-pay | Admitting: Family Medicine

## 2014-12-29 ENCOUNTER — Other Ambulatory Visit: Payer: Self-pay

## 2015-01-07 ENCOUNTER — Other Ambulatory Visit (INDEPENDENT_AMBULATORY_CARE_PROVIDER_SITE_OTHER): Payer: BLUE CROSS/BLUE SHIELD

## 2015-01-07 DIAGNOSIS — M7130 Other bursal cyst, unspecified site: Secondary | ICD-10-CM | POA: Diagnosis not present

## 2015-01-07 DIAGNOSIS — M7138 Other bursal cyst, other site: Secondary | ICD-10-CM

## 2015-01-07 DIAGNOSIS — Z01812 Encounter for preprocedural laboratory examination: Secondary | ICD-10-CM

## 2015-01-07 NOTE — Progress Notes (Signed)
Lab only for Dr Donalee CitrinGary Cram BMP,CBC  dx: m71.30, W11.914z01.812

## 2015-01-08 LAB — BMP8+EGFR
BUN/Creatinine Ratio: 23 (ref 9–23)
BUN: 13 mg/dL (ref 6–24)
CO2: 28 mmol/L (ref 18–29)
Calcium: 9.8 mg/dL (ref 8.7–10.2)
Chloride: 96 mmol/L — ABNORMAL LOW (ref 97–108)
Creatinine, Ser: 0.57 mg/dL (ref 0.57–1.00)
GFR calc Af Amer: 122 mL/min/{1.73_m2} (ref >59)
GFR calc non Af Amer: 106 mL/min/{1.73_m2} (ref >59)
Glucose: 181 mg/dL — ABNORMAL HIGH (ref 65–99)
Potassium: 4.9 mmol/L (ref 3.5–5.2)
Sodium: 140 mmol/L (ref 134–144)

## 2015-01-08 LAB — CBC WITH DIFFERENTIAL/PLATELET
Basophils Absolute: 0 10*3/uL (ref 0.0–0.2)
Basos: 1 %
EOS (ABSOLUTE): 0.1 10*3/uL (ref 0.0–0.4)
Eos: 2 %
Hematocrit: 43.8 % (ref 34.0–46.6)
Hemoglobin: 14.9 g/dL (ref 11.1–15.9)
Immature Grans (Abs): 0 10*3/uL (ref 0.0–0.1)
Immature Granulocytes: 0 %
Lymphocytes Absolute: 1.7 10*3/uL (ref 0.7–3.1)
Lymphs: 25 %
MCH: 31.7 pg (ref 26.6–33.0)
MCHC: 34 g/dL (ref 31.5–35.7)
MCV: 93 fL (ref 79–97)
Monocytes Absolute: 0.3 10*3/uL (ref 0.1–0.9)
Monocytes: 4 %
Neutrophils Absolute: 4.8 10*3/uL (ref 1.4–7.0)
Neutrophils: 68 %
Platelets: 254 10*3/uL (ref 150–379)
RBC: 4.7 x10E6/uL (ref 3.77–5.28)
RDW: 13.5 % (ref 12.3–15.4)
WBC: 7 10*3/uL (ref 3.4–10.8)

## 2015-02-01 ENCOUNTER — Other Ambulatory Visit: Payer: Self-pay | Admitting: Family

## 2015-02-23 ENCOUNTER — Ambulatory Visit: Payer: BLUE CROSS/BLUE SHIELD | Attending: Neurosurgery | Admitting: Physical Therapy

## 2015-02-23 DIAGNOSIS — M5137 Other intervertebral disc degeneration, lumbosacral region: Secondary | ICD-10-CM | POA: Diagnosis not present

## 2015-02-23 DIAGNOSIS — M545 Low back pain, unspecified: Secondary | ICD-10-CM

## 2015-02-23 DIAGNOSIS — Z9889 Other specified postprocedural states: Secondary | ICD-10-CM | POA: Insufficient documentation

## 2015-02-23 NOTE — Therapy (Signed)
[facility] [address] [place], [place], [address] Phone: [phone]   Fax:  [phone]  Physical Therapy Evaluation  Patient Details  Name: Gennifer Potenza MRN: [id number] Date of Birth: [birth year] Referring Provider:  Donalee Citrin, MD  Encounter Date: 02/23/2015      PT End of Session - 02/23/15 0940    Visit Number 1   Number of Visits 12   Date for PT Re-Evaluation 04/06/15   PT Start Time 0900      Past Medical History  Diagnosis Date  . Diabetes mellitus without complication   . Hypertension   . Anal fissure     history of  . Obesity   . Asthma   . Depression   . Substance abuse     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    Past Surgical History  Procedure Laterality Date  . Cholecystectomy    . Abdominal hysterectomy    . Tonsillectomy    . Cystostomy w/ bladder biopsy  1990    punctured hole in bladder, had open procedure to repair    There were no vitals filed for this visit.  Visit Diagnosis:  Midline low back pain without sciatica - Plan: PT plan of care cert/re-cert      Subjective Assessment - 02/23/15 0941    Subjective The surgery has been very helpful to get rid of my leg pain.   Limitations Sitting   How long can you sit comfortably? 10 to 15 minutes.   Patient Stated Goals Get back to pre-injury state.            [facility] PT Assessment - 02/23/15 0001    Assessment   Medical Diagnosis Lumbosacral DDD.   Onset Date/Surgical Date --  01/14/15.   Precautions   Precautions --  Lumbar microdiskectomy protocol.   Restrictions   Weight Bearing Restrictions No   Balance Screen   Has the patient fallen in the past 6 months No   Has the patient had a decrease in activity level because of a fear of falling?  No   Is the patient reluctant to leave their home because of a fear of falling?  No   Home Tourist information centre manager residence   Prior Function   Level of Independence  Independent   Cognition   Overall Cognitive Status Within Functional Limits for tasks assessed   Posture/Postural Control   Posture Comments --  posture generally quite good.   ROM / Strength   AROM / PROM / Strength Strength  Deferred spinal ROM assessment at this time.   Strength   Overall Strength Comments Normal bilateral LE strength.   Palpation   Palpation comment Tender on either side of the patient's lumbar incisional site.   Special Tests    Special Tests --  Unable to elicit bilateral Achille's reflexes.   Ambulation/Gait   Gait Comments Normal gait pattern.                   [facility] Adult PT Treatment/Exercise - 02/23/15 0001    Modalities   Modalities Electrical Stimulation   Electrical Stimulation   Electrical Stimulation Location Bilateral lower lumbar region   Electrical Stimulation Action Pre-mod E'stim (5 sec on and 5 sec off) x 15 minutes.   Electrical Stimulation Goals Pain                     PT Long Term Goals - 02/23/15 1012  PT LONG TERM GOAL #1   Title Ind with HEP.   Time 6   Period Weeks   Status New   PT LONG TERM GOAL #2   Title Perform ADL's with pain not > 2-3/10.   Time 6   Period Weeks   Status New   PT LONG TERM GOAL #3   Title Transitory movements with pain not > 2-3/10.   Time 6   Period Weeks   Status New   PT LONG TERM GOAL #4   Title Sit 30 minutes with pain not > 3/10.   Time 6   Period Weeks               Plan - 02/23/15 1610    Clinical Impression Statement On 10/07/14 was sliding a box off an overhead shelf at work. Patient was surprised at how heavy the box was. She then twisted and turn her body left and laid the box down. Felt something in right low back. She had some physical therapy.  Eventually she underwent  a lumbar laminectomy and microdiskectomy on 01/14/15.  Her pain-level is a 5/10 today but can go to 6-7/10 after sitting awhile on her couch.   Pt will benefit from skilled  therapeutic intervention in order to improve on the following deficits Decreased activity tolerance;Pain   Rehab Potential Excellent   PT Frequency 2x / week   PT Duration 6 weeks  or 12 visits.   PT Treatment/Interventions ADLs/Self Care Home Management;Electrical Stimulation;Therapeutic activities;Therapeutic exercise;Patient/family education;Manual techniques   PT Next Visit Plan Progress per microdiskectomy protocol.   Consulted and Agree with Plan of Care Patient         Problem List Patient Active Problem List   Diagnosis Date Noted  . 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 Neuropathy in diabetes 04/18/2013  . HTN (hypertension) 10/19/2012  . DM (diabetes mellitus) 10/19/2012  . HLD (hyperlipidemia) 10/19/2012    Eola Waldrep, [place] MPT 02/23/2015, 10:17 AM  [facility] [address] [place], [place], [address] Phone: [phone]   Fax:  [phone]

## 2015-02-27 ENCOUNTER — Ambulatory Visit: Payer: BLUE CROSS/BLUE SHIELD | Admitting: Physical Therapy

## 2015-02-27 ENCOUNTER — Encounter: Payer: Self-pay | Admitting: Physical Therapy

## 2015-02-27 DIAGNOSIS — M545 Low back pain, unspecified: Secondary | ICD-10-CM

## 2015-02-27 DIAGNOSIS — S39012A Strain of muscle, fascia and tendon of lower back, initial encounter: Secondary | ICD-10-CM

## 2015-02-27 NOTE — Patient Instructions (Signed)
Pelvic Tilt: Posterior - Legs Bent (Supine)   Tighten stomach and flatten back by rolling pelvis down. Hold _10___ seconds. Relax. Repeat _10-30___ times per set. Do __2__ sets per session. Do _2___ sessions per day.   . Straight Leg Raise   Tighten stomach and slowly raise locked right leg __4__ inches from floor. Repeat __10-30__ times per set. Do __2__ sets per session. Do __2__ sessions per day.    Self-Mobilization: Heel Slide (Supine)   Hold draw in and Slide left/Right heel toward buttocks until a gentle stretch is felt. Hold _5___ seconds. Relax. Repeat _10___ times per set. Do _2-3___ sets per session. Do _2___ sessions per day.  Bent Leg Lift (Hook-Lying)   Tighten stomach and slowly raise right leg _5___ inches from floor. Keep trunk rigid. Hold _3___ seconds. Repeat _10___ times per set. Do ___2-3_ sets per session. Do __2__ sessions per day.    

## 2015-02-27 NOTE — Therapy (Signed)
[facility] [address] [place], [place], [address] Phone: [phone]   Fax:  [phone]  Physical Therapy Treatment  Patient Details  Name: Nancy Thornton MRN: [id number] Date of Birth: 1961-07-04 Referring Provider:  Bennie Pierini, *  Encounter Date: 02/27/2015      PT End of Session - 02/27/15 0926    Visit Number 2   Number of Visits 12   Date for PT Re-Evaluation 04/06/15   PT Start Time 0901   PT Stop Time 0944   PT Time Calculation (min) 43 min   Activity Tolerance Patient tolerated treatment well   Behavior During Therapy [facility] for tasks assessed/performed      Past Medical History  Diagnosis Date  . Diabetes mellitus without complication   . Hypertension   . Anal fissure     history of  . Obesity   . Asthma   . Depression   . Substance abuse     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    Past Surgical History  Procedure Laterality Date  . Cholecystectomy    . Abdominal hysterectomy    . Tonsillectomy    . Cystostomy w/ bladder biopsy  1990    punctured hole in bladder, had open procedure to repair    There were no vitals filed for this visit.  Visit Diagnosis:  Midline low back pain without sciatica  Right-sided low back pain without sciatica  Low back strain, initial encounter      Subjective Assessment - 02/27/15 0914    Subjective patient reported some soreness after last treatment and some reffered pain in left LE   Limitations Sitting   How long can you sit comfortably? 10 to 15 minutes.   Patient Stated Goals Get back to pre-injury state.   Currently in Pain? Yes   Pain Score 4    Pain Location Back   Pain Orientation Right;Left;Lower   Pain Descriptors / Indicators Sore   Pain Type Chronic pain   Pain Onset More than a month ago   Aggravating Factors  activity or certain movements   Pain Relieving Factors rest                         [facility] Adult PT  Treatment/Exercise - 02/27/15 0001    Exercises   Exercises Lumbar   Lumbar Exercises: Supine   Ab Set 20 reps;5 seconds   Heel Slides 20 reps   Bent Knee Raise 20 reps;5 seconds   Straight Leg Raise 3 seconds;20 reps   Modalities   Modalities Electrical Stimulation   Electrical Stimulation   Electrical Stimulation Location low back   Electrical Stimulation Action premod   Electrical Stimulation Parameters 1-10HZ  5/5   Electrical Stimulation Goals Pain   Manual Therapy   Manual Therapy Soft tissue mobilization;Myofascial release   Manual therapy comments gentle manual and IASTM to low back paraspinals                PT Education - 02/27/15 0918    Education provided Yes   Education Details HEP   Person(s) Educated Patient   Methods Explanation;Demonstration;Handout   Comprehension Verbalized understanding;Returned demonstration             PT Long Term Goals - 02/23/15 1012    PT LONG TERM GOAL #1   Title Ind with HEP.   Time 6   Period Weeks   Status New   PT LONG TERM GOAL #  2   Title Perform ADL's with pain not > 2-3/10.   Time 6   Period Weeks   Status New   PT LONG TERM GOAL #3   Title Transitory movements with pain not > 2-3/10.   Time 6   Period Weeks   Status New   PT LONG TERM GOAL #4   Title Sit 30 minutes with pain not > 3/10.   Time 6   Period Weeks               Plan - 02/27/15 0930    Clinical Impression Statement Patient progressing with good response to treatment today and good understanding of posture techniques with all positions with bending, lifting, ADL's and posture to prevent pain and re-injury. HEP given for low level draw in/core activation. Goals ongoing due to pain limitations.   Pt will benefit from skilled therapeutic intervention in order to improve on the following deficits Decreased activity tolerance;Pain   Rehab Potential Excellent   PT Frequency 2x / week   PT Duration 6 weeks   PT Treatment/Interventions  ADLs/Self Care Home Management;Electrical Stimulation;Therapeutic activities;Therapeutic exercise;Patient/family education;Manual techniques   PT Next Visit Plan Progress per microdiskectomy protocol.   Consulted and Agree with Plan of Care Patient        Problem List Patient Active Problem List   Diagnosis Date Noted  . 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 Neuropathy in diabetes 04/18/2013  . HTN (hypertension) 10/19/2012  . DM (diabetes mellitus) 10/19/2012  . HLD (hyperlipidemia) 10/19/2012   Cathie Hoops, PTA 02/27/2015 9:59 AM  Carmelle Bamberg, Maryruth Bun, PTA 02/27/2015, 9:59 AM  [facility] [address] [place], [place], [address] Phone: [phone]   Fax:  [phone]

## 2015-03-02 ENCOUNTER — Ambulatory Visit: Payer: BLUE CROSS/BLUE SHIELD | Admitting: Physical Therapy

## 2015-03-02 ENCOUNTER — Encounter: Payer: Self-pay | Admitting: Physical Therapy

## 2015-03-02 DIAGNOSIS — S39012A Strain of muscle, fascia and tendon of lower back, initial encounter: Secondary | ICD-10-CM

## 2015-03-02 DIAGNOSIS — M545 Low back pain, unspecified: Secondary | ICD-10-CM

## 2015-03-02 NOTE — Therapy (Signed)
[facility] [address] [place], [place], [address] Phone: [phone]   Fax:  [phone]  Physical Therapy Treatment  Patient Details  Name: Nancy Thornton MRN: [id number] Date of Birth: 21-Feb-1961 Referring Provider:  Bennie Pierini, *  Encounter Date: 03/02/2015      PT End of Session - 03/02/15 1311    Visit Number 3   Number of Visits 12   Date for PT Re-Evaluation 04/06/15   PT Start Time 1301   PT Stop Time 1343   PT Time Calculation (min) 42 min   Activity Tolerance Patient tolerated treatment well   Behavior During Therapy [facility] for tasks assessed/performed      Past Medical History  Diagnosis Date  . Diabetes mellitus without complication   . Hypertension   . Anal fissure     history of  . Obesity   . Asthma   . Depression   . Substance abuse     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    Past Surgical History  Procedure Laterality Date  . Cholecystectomy    . Abdominal hysterectomy    . Tonsillectomy    . Cystostomy w/ bladder biopsy  1990    punctured hole in bladder, had open procedure to repair    There were no vitals filed for this visit.  Visit Diagnosis:  Midline low back pain without sciatica  Right-sided low back pain without sciatica  Low back strain, initial encounter      Subjective Assessment - 03/02/15 1302    Subjective felt better after last treatment   Limitations Sitting   How long can you sit comfortably? 10 to 15 minutes.   Patient Stated Goals Get back to pre-injury state.   Currently in Pain? No/denies                         [facility] Adult PT Treatment/Exercise - 03/02/15 0001    Lumbar Exercises: Supine   Ab Set 20 reps;5 seconds   Bent Knee Raise 20 reps;5 seconds   Bridge --  x12   Straight Leg Raise 3 seconds;20 reps   Programme researcher, broadcasting/film/video Location low back   Electrical Stimulation Action premod   Electrical  Stimulation Parameters 1-10hz    Electrical Stimulation Goals Pain   Manual Therapy   Manual Therapy Soft tissue mobilization;Myofascial release   Manual therapy comments gentle manual and IASTM to low back paraspinals                     PT Long Term Goals - 03/02/15 1316    PT LONG TERM GOAL #1   Title Ind with HEP.   Time 6   Period Weeks   Status On-going   PT LONG TERM GOAL #2   Title Perform ADL's with pain not > 2-3/10.   Time 6   Period Weeks   Status On-going   PT LONG TERM GOAL #3   Title Transitory movements with pain not > 2-3/10.   Time 6   Period Weeks   Status On-going   PT LONG TERM GOAL #4   Title Sit 30 minutes with pain not > 3/10.   Time 6   Period Weeks   Status On-going               Plan - 03/02/15 1313    Clinical Impression Statement Patient progresing with all activities. Patient reported no pain today  and is slowly progressing with core activation techniques with no difficulty. patient has mile soreness with palpation. Goals ongoing due to pain limitations.    Pt will benefit from skilled therapeutic intervention in order to improve on the following deficits Decreased activity tolerance;Pain   Rehab Potential Excellent   PT Frequency 2x / week   PT Treatment/Interventions ADLs/Self Care Home Management;Electrical Stimulation;Therapeutic activities;Therapeutic exercise;Patient/family education;Manual techniques   PT Next Visit Plan cont with POC/Progress per microdiskectomy protocol.   Consulted and Agree with Plan of Care Patient        Problem List Patient Active Problem List   Diagnosis Date Noted  . 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 Neuropathy in diabetes 04/18/2013  . HTN (hypertension) 10/19/2012  . DM (diabetes mellitus) 10/19/2012  . HLD (hyperlipidemia) 10/19/2012    Vannary Greening P, PTA 03/02/2015, 1:44 PM  [facility] [address] [place], [place],  [address] Phone: [phone]   Fax:  [phone]

## 2015-03-04 ENCOUNTER — Encounter: Payer: Self-pay | Admitting: Physical Therapy

## 2015-03-04 ENCOUNTER — Ambulatory Visit: Payer: BLUE CROSS/BLUE SHIELD | Admitting: Physical Therapy

## 2015-03-04 DIAGNOSIS — S39012A Strain of muscle, fascia and tendon of lower back, initial encounter: Secondary | ICD-10-CM

## 2015-03-04 DIAGNOSIS — M545 Low back pain, unspecified: Secondary | ICD-10-CM

## 2015-03-04 NOTE — Therapy (Signed)
[facility] [address] [place], [place], [address] Phone: [phone]   Fax:  [phone]  Physical Therapy Treatment  Patient Details  Name: Nancy Thornton MRN: [id number] Date of Birth: Oct 23, 1960 Referring Provider:  Bennie Pierini, *  Encounter Date: 03/04/2015      PT End of Session - 03/04/15 1144    Visit Number 4   Number of Visits 12   Date for PT Re-Evaluation 04/06/15   PT Start Time 1114   PT Stop Time 1203   PT Time Calculation (min) 49 min   Activity Tolerance Patient tolerated treatment well   Behavior During Therapy [facility] for tasks assessed/performed      Past Medical History  Diagnosis Date  . Diabetes mellitus without complication   . Hypertension   . Anal fissure     history of  . Obesity   . Asthma   . Depression   . Substance abuse     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    Past Surgical History  Procedure Laterality Date  . Cholecystectomy    . Abdominal hysterectomy    . Tonsillectomy    . Cystostomy w/ bladder biopsy  1990    punctured hole in bladder, had open procedure to repair    There were no vitals filed for this visit.  Visit Diagnosis:  Midline low back pain without sciatica  Right-sided low back pain without sciatica  Low back strain, initial encounter      Subjective Assessment - 03/04/15 1131    Subjective felt better after last treatment   Limitations Sitting   How long can you sit comfortably? 10 to 15 minutes.   Patient Stated Goals Get back to pre-injury state.   Currently in Pain? Yes   Pain Score 3    Pain Location Back   Pain Orientation Right;Left;Lower   Pain Descriptors / Indicators Sore   Pain Type Chronic pain   Pain Onset More than a month ago   Aggravating Factors  activity   Pain Relieving Factors rest                         [facility] Adult PT Treatment/Exercise - 03/04/15 0001    Lumbar Exercises: Aerobic   Stationary Bike  nustep L3 x10 min posture /core activation   Lumbar Exercises: Standing   Other Standing Lumbar Exercises scap retaction/ext 2x10 each with core activation   Lumbar Exercises: Supine   Bent Knee Raise 20 reps;5 seconds   Bridge 20 reps   Straight Leg Raise 3 seconds;20 reps   Electrical Stimulation   Electrical Stimulation Location low back   Electrical Stimulation Action premod   Electrical Stimulation Parameters 1-10hz    Electrical Stimulation Goals Pain                     PT Long Term Goals - 03/02/15 1316    PT LONG TERM GOAL #1   Title Ind with HEP.   Time 6   Period Weeks   Status On-going   PT LONG TERM GOAL #2   Title Perform ADL's with pain not > 2-3/10.   Time 6   Period Weeks   Status On-going   PT LONG TERM GOAL #3   Title Transitory movements with pain not > 2-3/10.   Time 6   Period Weeks   Status On-going   PT LONG TERM GOAL #4   Title Sit 30 minutes  with pain not > 3/10.   Time 6   Period Weeks   Status On-going               Plan - 03/04/15 1147    Clinical Impression Statement patient progressing with less pain reported overall and was able to progress with core activation exercises with no difficulty. Patient continues to practice posture and core HEP. goals ongoing due to pain limitations. No antalgic gait.   Pt will benefit from skilled therapeutic intervention in order to improve on the following deficits Decreased activity tolerance;Pain   Rehab Potential Excellent   PT Frequency 2x / week   PT Duration 6 weeks   PT Treatment/Interventions ADLs/Self Care Home Management;Electrical Stimulation;Therapeutic activities;Therapeutic exercise;Patient/family education;Manual techniques   PT Next Visit Plan cont with POC/Progress per microdiskectomy protocol.   Consulted and Agree with Plan of Care Patient        Problem List Patient Active Problem List   Diagnosis Date Noted  . 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 Neuropathy in  diabetes 04/18/2013  . HTN (hypertension) 10/19/2012  . DM (diabetes mellitus) 10/19/2012  . HLD (hyperlipidemia) 10/19/2012    Phallon Haydu P, PTA 03/04/2015, 12:05 PM  [facility] [address] [place], [place], [address] Phone: [phone]   Fax:  [phone]

## 2015-03-11 ENCOUNTER — Ambulatory Visit: Payer: BLUE CROSS/BLUE SHIELD | Attending: Neurosurgery | Admitting: Physical Therapy

## 2015-03-11 ENCOUNTER — Encounter: Payer: Self-pay | Admitting: Physical Therapy

## 2015-03-11 DIAGNOSIS — X58XXXA Exposure to other specified factors, initial encounter: Secondary | ICD-10-CM | POA: Insufficient documentation

## 2015-03-11 DIAGNOSIS — M545 Low back pain, unspecified: Secondary | ICD-10-CM

## 2015-03-11 DIAGNOSIS — S39012A Strain of muscle, fascia and tendon of lower back, initial encounter: Secondary | ICD-10-CM | POA: Diagnosis present

## 2015-03-11 NOTE — Patient Instructions (Signed)
Brushing Teeth    Place one foot on ledge and one hand on counter. Bend other knee slightly to keep back straight.  Copyright  VHI. All rights reserved.  Refrigerator   Squat with knees apart to reach lower shelves and drawers.   Copyright  VHI. All rights reserved.  Laundry [employer] down and hold basket close to stand. Use leg muscles to do the work.   Copyright  VHI. All rights reserved.  Housework - Vacuuming   Hold the vacuum with arm held at side. Step back and forth to move it, keeping head up. Avoid twisting.   Copyright  VHI. All rights reserved.  Housework - Wiping   Position yourself as close as possible to reach work surface. Avoid straining your back.   Sleeping on Side   Place pillow between knees. Use cervical support under neck and a roll around waist as needed.

## 2015-03-11 NOTE — Therapy (Signed)
[facility] [address] [place], [place], [address] Phone: [phone]   Fax:  [phone]  Physical Therapy Treatment  Patient Details  Name: Nancy Thornton MRN: [id number] Date of Birth: July 12, 1960 Referring Provider:  Bennie Pierini, *  Encounter Date: 03/11/2015      PT End of Session - 03/11/15 1048    Visit Number 5   Number of Visits 12   Date for PT Re-Evaluation 04/06/15   PT Start Time 1030   PT Stop Time 1126   PT Time Calculation (min) 56 min   Activity Tolerance Patient tolerated treatment well   Behavior During Therapy [facility] for tasks assessed/performed      Past Medical History  Diagnosis Date  . Diabetes mellitus without complication   . Hypertension   . Anal fissure     history of  . Obesity   . Asthma   . Depression   . Substance abuse     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    Past Surgical History  Procedure Laterality Date  . Cholecystectomy    . Abdominal hysterectomy    . Tonsillectomy    . Cystostomy w/ bladder biopsy  1990    punctured hole in bladder, had open procedure to repair    There were no vitals filed for this visit.  Visit Diagnosis:  Midline low back pain without sciatica  Right-sided low back pain without sciatica  Low back strain, initial encounter      Subjective Assessment - 03/11/15 1035    Subjective some soreness over weekend may have been from mopping floor   Limitations Sitting   How long can you sit comfortably? 10 to 15 minutes.   Patient Stated Goals Get back to pre-injury state.   Currently in Pain? Yes   Pain Score 6    Pain Location Back   Pain Orientation Right;Left;Lower   Pain Descriptors / Indicators Sore   Pain Type Chronic pain   Pain Onset More than a month ago   Pain Frequency Constant   Aggravating Factors  any increased activity   Pain Relieving Factors rest                         [facility] Adult PT  Treatment/Exercise - 03/11/15 0001    Lumbar Exercises: Aerobic   Stationary Bike nustep L3 x10 min posture /core activation   Lumbar Exercises: Supine   Ab Set 20 reps  10 second holds   Bent Knee Raise 20 reps;5 seconds   Bridge 20 reps   Straight Leg Raise 3 seconds;20 reps   Electrical Stimulation   Electrical Stimulation Location low back   Electrical Stimulation Action premod   Electrical Stimulation Parameters 1-10hz    Electrical Stimulation Goals Pain   Manual Therapy   Manual Therapy Soft tissue mobilization;Myofascial release   Manual therapy comments gentle manual and IASTM to low back paraspinals                PT Education - 03/11/15 1056    Education provided Yes   Education Details HEP Posture techniques   Person(s) Educated Patient   Methods Explanation;Demonstration;Handout   Comprehension Verbalized understanding;Returned demonstration             PT Long Term Goals - 03/02/15 1316    PT LONG TERM GOAL #1   Title Ind with HEP.   Time 6   Period Weeks   Status On-going  PT LONG TERM GOAL #2   Title Perform ADL's with pain not > 2-3/10.   Time 6   Period Weeks   Status On-going   PT LONG TERM GOAL #3   Title Transitory movements with pain not > 2-3/10.   Time 6   Period Weeks   Status On-going   PT LONG TERM GOAL #4   Title Sit 30 minutes with pain not > 3/10.   Time 6   Period Weeks   Status On-going               Plan - 03/11/15 1052    Clinical Impression Statement Patient progressing slowly due to pain level. Patient had increase pain over the weekend from mopping floor. Educated patient on technique with ADL's and cleaning to prevent re-injury/Pain. HEP given. Goals ongoing due to pain deficits.    Pt will benefit from skilled therapeutic intervention in order to improve on the following deficits Decreased activity tolerance;Pain   Rehab Potential Excellent   PT Frequency 2x / week   PT Duration 6 weeks   PT  Treatment/Interventions ADLs/Self Care Home Management;Electrical Stimulation;Therapeutic activities;Therapeutic exercise;Patient/family education;Manual techniques   PT Next Visit Plan cont with POC/Progress per microdiskectomy protocol.   Consulted and Agree with Plan of Care Patient        Problem List Patient Active Problem List   Diagnosis Date Noted  . 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 Neuropathy in diabetes 04/18/2013  . HTN (hypertension) 10/19/2012  . DM (diabetes mellitus) 10/19/2012  . HLD (hyperlipidemia) 10/19/2012   Cathie Hoops, PTA 03/11/2015 11:27 AM  Omunique Pederson, Maryruth Bun, PTA 03/11/2015, 11:26 AM  [facility] [address] [place], [place], [address] Phone: [phone]   Fax:  [phone]

## 2015-03-17 ENCOUNTER — Encounter: Payer: Self-pay | Admitting: Physical Therapy

## 2015-03-17 ENCOUNTER — Ambulatory Visit: Payer: BLUE CROSS/BLUE SHIELD | Admitting: Physical Therapy

## 2015-03-17 DIAGNOSIS — M545 Low back pain, unspecified: Secondary | ICD-10-CM

## 2015-03-17 DIAGNOSIS — S39012A Strain of muscle, fascia and tendon of lower back, initial encounter: Secondary | ICD-10-CM

## 2015-03-17 NOTE — Therapy (Addendum)
[facility] [address] [place], [place], [address] Phone: [phone]   Fax:  [phone]  Physical Therapy Treatment  Patient Details  Name: Nancy Thornton MRN: [id number] Date of Birth: May 26, 1961 Referring Provider:  Bennie Pierini, *  Encounter Date: 03/17/2015      PT End of Session - 03/17/15 1021    Visit Number 6   Number of Visits 12   Date for PT Re-Evaluation 04/06/15   PT Start Time 0948   PT Stop Time 1021   PT Time Calculation (min) 33 min      Past Medical History  Diagnosis Date  . Diabetes mellitus without complication   . Hypertension   . Anal fissure     history of  . Obesity   . Asthma   . Depression   . Substance abuse     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    Past Surgical History  Procedure Laterality Date  . Cholecystectomy    . Abdominal hysterectomy    . Tonsillectomy    . Cystostomy w/ bladder biopsy  1990    punctured hole in bladder, had open procedure to repair    There were no vitals filed for this visit.  Visit Diagnosis:  Midline low back pain without sciatica  Right-sided low back pain without sciatica  Low back strain, initial encounter      Subjective Assessment - 03/17/15 1000    Subjective Reports some pain with sweeping but reports not overexerting herself and knows her limits. States that when weather is good and not as hot she will begin walking. Reports that it is difficult to keep core activated during cleaning after a while when she becomes tired.   Limitations Sitting   How long can you sit comfortably? 10 to 15 minutes.   Patient Stated Goals Get back to pre-injury state.   Currently in Pain? Yes   Pain Score 4    Pain Location Back   Pain Orientation Left;Lower   Pain Descriptors / Indicators Aching   Pain Type Chronic pain   Pain Onset More than a month ago   Pain Frequency Constant            [facility] PT Assessment - 03/17/15 0001    Assessment   Medical Diagnosis Lumbosacral DDD.   Onset Date/Surgical Date 01/14/15   Next MD Visit 03/26/2015  Dr. Roney Mans Adult PT Treatment/Exercise - 03/17/15 0001    Lumbar Exercises: Aerobic   Stationary Bike NuStep L4 x22 min with verbal cueing for core activation   Lumbar Exercises: Supine   Ab Set 20 reps   Bent Knee Raise 20 reps;5 seconds   Bridge 20 reps   Straight Leg Raise 3 seconds;20 reps   Other Supine Lumbar Exercises 2# ball to lap x20 reps 3 sec                     PT Long Term Goals - 03/02/15 1316    PT LONG TERM GOAL #1   Title Ind with HEP.   Time 6   Period Weeks   Status On-going   PT LONG TERM GOAL #2   Title Perform ADL's with pain not > 2-3/10.   Time 6   Period Weeks   Status On-going   PT LONG TERM GOAL #3   Title Transitory movements  with pain not > 2-3/10.   Time 6   Period Weeks   Status On-going   PT LONG TERM GOAL #4   Title Sit 30 minutes with pain not > 3/10.   Time 6   Period Weeks   Status On-going               Plan - 03/17/15 1024    Clinical Impression Statement Patient tolerated treatment well today with only soreness reported following supine core exercises. Was able to tolerate a longer period of time on NuStep and a greater resistance with no complaints. Denied stimulation today and decided to skip STW today secondary to feeling little tightness. Educated to try to keep core musculature activated while completing household cleaning as best she can. Experienced 3/10 pain following treatment although she reported pain rating being difficult because she has pain all the time.   Pt will benefit from skilled therapeutic intervention in order to improve on the following deficits Decreased activity tolerance;Pain   Rehab Potential Excellent   PT Frequency 2x / week   PT Duration 6 weeks   PT Treatment/Interventions ADLs/Self Care Home Management;Electrical  Stimulation;Therapeutic activities;Therapeutic exercise;Patient/family education;Manual techniques;Ultrasound   PT Next Visit Plan cont with POC/Progress per microdiskectomy protocol.   Consulted and Agree with Plan of Care Patient        Problem List Patient Active Problem List   Diagnosis Date Noted  . 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 Neuropathy in diabetes 04/18/2013  . HTN (hypertension) 10/19/2012  . DM (diabetes mellitus) 10/19/2012  . HLD (hyperlipidemia) 10/19/2012    Evelene Croon, PTA 03/17/2015, 11:10 AM  [facility] [address] [place], [place], [address] Phone: [phone]   Fax:  [phone]

## 2015-03-19 ENCOUNTER — Encounter: Payer: Self-pay | Admitting: Physical Therapy

## 2015-03-19 ENCOUNTER — Ambulatory Visit: Payer: BLUE CROSS/BLUE SHIELD | Admitting: Physical Therapy

## 2015-03-19 DIAGNOSIS — M545 Low back pain, unspecified: Secondary | ICD-10-CM

## 2015-03-19 DIAGNOSIS — S39012A Strain of muscle, fascia and tendon of lower back, initial encounter: Secondary | ICD-10-CM

## 2015-03-19 NOTE — Therapy (Signed)
[facility] [address] [place], [place], [address] Phone: [phone]   Fax:  [phone]  Physical Therapy Treatment  Patient Details  Name: Nancy Thornton MRN: [id number] Date of Birth: 1960-11-19 Referring Provider:  Bennie Pierini, *  Encounter Date: 03/19/2015      PT End of Session - 03/19/15 1034    Visit Number 7   Number of Visits 12   Date for PT Re-Evaluation 04/06/15   PT Start Time 1031   PT Stop Time 1115   PT Time Calculation (min) 44 min   Activity Tolerance Patient tolerated treatment well   Behavior During Therapy [facility] for tasks assessed/performed      Past Medical History  Diagnosis Date  . Diabetes mellitus without complication   . Hypertension   . Anal fissure     history of  . Obesity   . Asthma   . Depression   . Substance abuse     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    Past Surgical History  Procedure Laterality Date  . Cholecystectomy    . Abdominal hysterectomy    . Tonsillectomy    . Cystostomy w/ bladder biopsy  1990    punctured hole in bladder, had open procedure to repair    There were no vitals filed for this visit.  Visit Diagnosis:  Midline low back pain without sciatica  Right-sided low back pain without sciatica  Low back strain, initial encounter      Subjective Assessment - 03/19/15 1033    Subjective Reports that she had a rough night and pain woke her up during the night.   Limitations Sitting   How long can you sit comfortably? 10 to 15 minutes.   Patient Stated Goals Get back to pre-injury state.   Currently in Pain? Yes   Pain Score 4    Pain Location Back   Pain Orientation Lower   Pain Descriptors / Indicators Aching   Pain Type Chronic pain   Pain Onset More than a month ago            [facility] PT Assessment - 03/19/15 0001    Assessment   Medical Diagnosis Lumbosacral DDD.   Onset Date/Surgical Date 01/14/15   Next MD Visit 03/26/2015   Dr. Roney Mans Adult PT Treatment/Exercise - 03/19/15 0001    Lumbar Exercises: Aerobic   Stationary Bike NuStep L4 x21 min with cueing for core activiation   Lumbar Exercises: Supine   Ab Set 20 reps;5 seconds   Bent Knee Raise 20 reps   Bridge 20 reps   Straight Leg Raise 3 seconds;20 reps   Other Supine Lumbar Exercises 4# ball to lap x20 reps 3 sec                     PT Long Term Goals - 03/02/15 1316    PT LONG TERM GOAL #1   Title Ind with HEP.   Time 6   Period Weeks   Status On-going   PT LONG TERM GOAL #2   Title Perform ADL's with pain not > 2-3/10.   Time 6   Period Weeks   Status On-going   PT LONG TERM GOAL #3   Title Transitory movements with pain not > 2-3/10.   Time 6   Period Weeks   Status On-going  PT LONG TERM GOAL #4   Title Sit 30 minutes with pain not > 3/10.   Time 6   Period Weeks   Status On-going               Plan - 03/19/15 1210    Clinical Impression Statement Patient tolerated treatment well today with only tenderness and soreness reported in low back following treatment. Continues to do well with NuStep and all core exercises without complaint of increased pain. Denied stimulation today per personal preference. Experienced 4/10 pain following treatment.   Pt will benefit from skilled therapeutic intervention in order to improve on the following deficits Decreased activity tolerance;Pain   Rehab Potential Excellent   PT Frequency 2x / week   PT Duration 6 weeks   PT Treatment/Interventions ADLs/Self Care Home Management;Electrical Stimulation;Therapeutic activities;Therapeutic exercise;Patient/family education;Manual techniques;Ultrasound   PT Next Visit Plan cont with POC/Progress per microdiskectomy protocol.   Consulted and Agree with Plan of Care Patient        Problem List Patient Active Problem List   Diagnosis Date Noted  . 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 Neuropathy in  diabetes 04/18/2013  . HTN (hypertension) 10/19/2012  . DM (diabetes mellitus) 10/19/2012  . HLD (hyperlipidemia) 10/19/2012    Evelene Croon, PTA 03/19/2015, 12:14 PM  [facility] [address] [place], [place], [address] Phone: [phone]   Fax:  [phone]

## 2015-03-23 ENCOUNTER — Encounter: Payer: Self-pay | Admitting: Physical Therapy

## 2015-03-23 ENCOUNTER — Ambulatory Visit: Payer: BLUE CROSS/BLUE SHIELD | Admitting: Physical Therapy

## 2015-03-23 DIAGNOSIS — M545 Low back pain, unspecified: Secondary | ICD-10-CM

## 2015-03-23 DIAGNOSIS — S39012A Strain of muscle, fascia and tendon of lower back, initial encounter: Secondary | ICD-10-CM

## 2015-03-23 NOTE — Therapy (Addendum)
[facility] [place], [place], [address] Phone: [phone]   Fax:  [phone]  Physical Therapy Treatment  Patient Details  Name: Nancy Thornton MRN: [id number] Date of Birth: [birth year] Referring Provider:  Chevis Pretty, *  Encounter Date: 03/23/2015      PT End of Session - 03/23/15 1352    Visit Number 8   Number of Visits 12   Date for PT Re-Evaluation 04/06/15   PT Start Time 2878   PT Stop Time 1356   PT Time Calculation (min) 41 min   Activity Tolerance Patient tolerated treatment well   Behavior During Therapy [facility] for tasks assessed/performed      Past Medical History  Diagnosis Date  . Diabetes mellitus without complication   . Hypertension   . Anal fissure     history of  . Obesity   . Asthma   . Depression   . Substance abuse     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    Past Surgical History  Procedure Laterality Date  . Cholecystectomy    . Abdominal hysterectomy    . Tonsillectomy    . Cystostomy w/ bladder biopsy  1990    punctured hole in bladder, had open procedure to repair    There were no vitals filed for this visit.  Visit Diagnosis:  Midline low back pain without sciatica  Right-sided low back pain without sciatica  Low back strain, initial encounter      Subjective Assessment - 03/23/15 1322    Subjective less pain today per patient   Limitations Sitting   How long can you sit comfortably? 10 to 15 minutes.   Patient Stated Goals Get back to pre-injury state.   Currently in Pain? Yes   Pain Score 3    Pain Location Back   Pain Orientation Right;Left;Lower   Pain Descriptors / Indicators Sore   Pain Type Surgical pain   Pain Onset More than a month ago   Pain Frequency Intermittent   Aggravating Factors  first thing in morning   Pain Relieving Factors meds and rest                         [facility] Adult PT Treatment/Exercise - 03/23/15 0001     Lumbar Exercises: Aerobic   Stationary Bike NuStep L4 x min with cueing for core activiation   Lumbar Exercises: Standing   Other Standing Lumbar Exercises scap retaction/ext with pink XTS 3x10 each with core activation   Other Standing Lumbar Exercises 2# ball for reachouts and D1/D2 2x10 each   Lumbar Exercises: Supine   Bent Knee Raise 20 reps;5 seconds   Bridge 20 reps;3 seconds  then with swiss ball straight leg bridge 2x10   Straight Leg Raise 20 reps;3 seconds   Other Supine Lumbar Exercises bil LE lowering 2x5                     PT Long Term Goals - 03/23/15 1353    PT LONG TERM GOAL #1   Title Ind with HEP.   Time 6   Period Weeks   Status Achieved   PT LONG TERM GOAL #2   Title Perform ADL's with pain not > 2-3/10.   Time 6   Period Weeks   Status On-going   PT LONG TERM GOAL #3   Title Transitory movements with pain not > 2-3/10.   Time 6  Period Weeks   Status On-going   PT LONG TERM GOAL #4   Title Sit 30 minutes with pain not > 3/10.   Time 6   Period Weeks   Status On-going               Plan - 03/23/15 1354    Clinical Impression Statement Patient continues to progress with all activities. Has less pain reported today and overall. Patient is independent with HEP and posture techniques. Patient unable to perform ADL's or walk community distance with pain less than 4/10. LTG #1 met others ongoing due to pain deficits.   Pt will benefit from skilled therapeutic intervention in order to improve on the following deficits Decreased activity tolerance;Pain   Rehab Potential Excellent   PT Frequency 2x / week   PT Duration 6 weeks   PT Treatment/Interventions ADLs/Self Care Home Management;Electrical Stimulation;Therapeutic activities;Therapeutic exercise;Patient/family education;Manual techniques;Ultrasound   PT Next Visit Plan cont with POC/Progress per microdiskectomy protocol.   Consulted and Agree with Plan of Care Patient         Problem List Patient Active Problem List   Diagnosis Date Noted  . 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 Neuropathy in diabetes 04/18/2013  . HTN (hypertension) 10/19/2012  . DM (diabetes mellitus) 10/19/2012  . HLD (hyperlipidemia) 10/19/2012    Brittannie Tawney P, PTA 03/23/2015, 2:01 PM  [facility] [place], [place], [address] Phone: [phone]   Fax:  [phone]

## 2015-03-27 ENCOUNTER — Encounter: Payer: Self-pay | Admitting: Physical Therapy

## 2015-03-27 ENCOUNTER — Ambulatory Visit: Payer: BLUE CROSS/BLUE SHIELD | Admitting: Physical Therapy

## 2015-03-27 DIAGNOSIS — S39012A Strain of muscle, fascia and tendon of lower back, initial encounter: Secondary | ICD-10-CM

## 2015-03-27 DIAGNOSIS — M545 Low back pain, unspecified: Secondary | ICD-10-CM

## 2015-03-27 NOTE — Therapy (Signed)
[facility] [address] [place], [place], [address] Phone: [phone]   Fax:  [phone]  Physical Therapy Treatment  Patient Details  Name: Nancy Thornton MRN: [id number] Date of Birth: [birth year] Referring Provider:  Bennie Pierini, *  Encounter Date: 03/27/2015      PT End of Session - 03/27/15 0909    Visit Number 9   Number of Visits 12   Date for PT Re-Evaluation 04/06/15   PT Start Time 0902   PT Stop Time 0947   PT Time Calculation (min) 45 min   Activity Tolerance Patient tolerated treatment well   Behavior During Therapy [facility] for tasks assessed/performed      Past Medical History  Diagnosis Date  . Diabetes mellitus without complication   . Hypertension   . Anal fissure     history of  . Obesity   . Asthma   . Depression   . Substance abuse     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    Past Surgical History  Procedure Laterality Date  . Cholecystectomy    . Abdominal hysterectomy    . Tonsillectomy    . Cystostomy w/ bladder biopsy  1990    punctured hole in bladder, had open procedure to repair    There were no vitals filed for this visit.  Visit Diagnosis:  Midline low back pain without sciatica  Right-sided low back pain without sciatica  Low back strain, initial encounter      Subjective Assessment - 03/27/15 0909    Subjective Reports increased soreness in L low back since she woke up this morning. Reports her back hurts when she wakes up in the morning and thinks laying wrong may have caused the increased soreness this morning. Reports that she seen the MD yesterday and he said it would take about 1 year for recovery. Reports that she still may not be able to stand for long periods of time at work and was given a note for work.   Limitations Sitting   How long can you sit comfortably? 10 to 15 minutes.   Patient Stated Goals Get back to pre-injury state.   Currently in Pain? Yes   Pain Score 7    Pain Location Back   Pain Orientation Left;Lower   Pain Descriptors / Indicators Sore   Pain Type Surgical pain   Pain Onset More than a month ago            [facility] PT Assessment - 03/27/15 0001    Assessment   Medical Diagnosis Lumbosacral DDD.   Onset Date/Surgical Date 01/14/15   Next MD Visit 05/21/2015                     [facility] Adult PT Treatment/Exercise - 03/27/15 0001    Lumbar Exercises: Aerobic   Stationary Bike NuStep L6 x12 min   Lumbar Exercises: Standing   Other Standing Lumbar Exercises B scap retract pink XTS x30 reps, B shoulder ext pink XTS x30 reps   Other Standing Lumbar Exercises 2# ball for reachouts and D1/D2 2x10 each   Lumbar Exercises: Supine   Bent Knee Raise 3 seconds;Other (comment)  x25 reps   Bridge 20 reps;3 seconds  on red theraball   Straight Leg Raise 3 seconds  x30 reps bilaterally   Manual Therapy   Manual Therapy Myofascial release   Myofascial Release MFR to L lumbar paraspinals, QL, posterior hip musculature to decrease tightness and soreness in  region                     PT Long Term Goals - 03/27/15 0911    PT LONG TERM GOAL #1   Title Ind with HEP.   Time 6   Period Weeks   Status Achieved   PT LONG TERM GOAL #2   Title Perform ADL's with pain not > 2-3/10.   Time 6   Period Weeks   Status On-going   PT LONG TERM GOAL #3   Title Transitory movements with pain not > 2-3/10.   Time 6   Period Weeks   Status On-going  5/10 per patient reports on 03/27/2015   PT LONG TERM GOAL #4   Title Sit 30 minutes with pain not > 3/10.   Time 6   Period Weeks   Status Achieved  Reports that the pain may stay at a 3/10 with sitting for               Plan - 03/27/15 0953    Clinical Impression Statement Patient tolerated treatment well today although she had increased soreness in L lumbar musculature today. Completed all exericses well and as directed with verbal cueing for  core activation with all exercises. Demonstrated notable increased tightness in L lumbar parspinals, QL, and posterior hip musculatire during manual therapy. Goals remain on-going secondary to pain experienced by patient. Experienced 3/10 pain following treatment.   Pt will benefit from skilled therapeutic intervention in order to improve on the following deficits Decreased activity tolerance;Pain   Rehab Potential Excellent   PT Frequency 2x / week   PT Duration 6 weeks   PT Treatment/Interventions ADLs/Self Care Home Management;Electrical Stimulation;Therapeutic activities;Therapeutic exercise;Patient/family education;Manual techniques;Ultrasound   PT Next Visit Plan cont with POC/Progress per microdiskectomy protocol.   Consulted and Agree with Plan of Care Patient        Problem List Patient Active Problem List   Diagnosis Date Noted  . 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 Neuropathy in diabetes 04/18/2013  . HTN (hypertension) 10/19/2012  . DM (diabetes mellitus) 10/19/2012  . HLD (hyperlipidemia) 10/19/2012    Evelene Croon, PTA 03/27/2015, 10:00 AM  [facility] [address] [place], [place], [address] Phone: [phone]   Fax:  [phone]

## 2015-03-31 ENCOUNTER — Ambulatory Visit: Payer: BLUE CROSS/BLUE SHIELD | Admitting: Physical Therapy

## 2015-03-31 DIAGNOSIS — M545 Low back pain, unspecified: Secondary | ICD-10-CM

## 2015-03-31 DIAGNOSIS — S39012A Strain of muscle, fascia and tendon of lower back, initial encounter: Secondary | ICD-10-CM

## 2015-03-31 NOTE — Therapy (Signed)
[facility] [address] [place], [place], [address] Phone: [phone]   Fax:  [phone]  Physical Therapy Treatment  Patient Details  Name: Nancy Thornton MRN: [id number] Date of Birth: June 27, 1961 Referring Provider:  Bennie Pierini, *  Encounter Date: 03/31/2015      PT End of Session - 03/31/15 1717    Visit Number 10   Number of Visits 12   Date for PT Re-Evaluation 04/06/15   PT Start Time 0230   PT Stop Time 0318   PT Time Calculation (min) 48 min   Activity Tolerance Patient tolerated treatment well   Behavior During Therapy [facility] for tasks assessed/performed      Past Medical History  Diagnosis Date  . Diabetes mellitus without complication   . Hypertension   . Anal fissure     history of  . Obesity   . Asthma   . Depression   . Substance abuse     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    Past Surgical History  Procedure Laterality Date  . Cholecystectomy    . Abdominal hysterectomy    . Tonsillectomy    . Cystostomy w/ bladder biopsy  1990    punctured hole in bladder, had open procedure to repair    There were no vitals filed for this visit.  Visit Diagnosis:  Midline low back pain without sciatica - Plan: PT plan of care cert/re-cert  Right-sided low back pain without sciatica - Plan: PT plan of care cert/re-cert  Low back strain, initial encounter - Plan: PT plan of care cert/re-cert      Subjective Assessment - 03/31/15 1712    Subjective Pain around a 7/10 today.   Limitations Sitting   How long can you sit comfortably? 10 to 15 minutes.   Patient Stated Goals Get back to pre-injury state.   Pain Score 7    Pain Location Back   Pain Orientation Left;Lower   Pain Descriptors / Indicators Sore   Pain Type Surgical pain   Pain Onset More than a month ago                         [facility] Adult PT Treatment/Exercise - 03/31/15 0001    Exercises   Exercises Knee/Hip   Lumbar Exercises: Aerobic   Stationary Bike Nustep level 6 x 20 minutes.   Ultrasound   Ultrasound Location Left low back.   Ultrasound Parameters 1.50 W/CM2 x 9 minutes.   Manual Therapy   Manual Therapy Soft tissue mobilization   Manual therapy comments x 9 minutes.                     PT Long Term Goals - 03/31/15 1718    PT LONG TERM GOAL #1   Title Ind with HEP.   Time 6   Period Weeks   Status Achieved   PT LONG TERM GOAL #2   Title Perform ADL's with pain not > 2-3/10.   Time 6   Period Weeks   Status On-going   PT LONG TERM GOAL #3   Title Transitory movements with pain not > 2-3/10.   Time 6   Period Weeks   Status On-going   PT LONG TERM GOAL #4   Title Sit 30 minutes with pain not > 3/10.   Time 6   Period Weeks   Status Achieved  Plan - 03/31/15 1718    Clinical Impression Statement Patient reporting a pain-level around 7/10 today.  Goals are ongoing at this time.  Plan to progress patient with core exercises.   Pt will benefit from skilled therapeutic intervention in order to improve on the following deficits Decreased activity tolerance;Pain   Rehab Potential Excellent   PT Frequency 2x / week   PT Duration 6 weeks   PT Treatment/Interventions ADLs/Self Care Home Management;Electrical Stimulation;Therapeutic activities;Therapeutic exercise;Patient/family education;Manual techniques;Ultrasound   PT Next Visit Plan cont with POC/Progress per microdiskectomy protocol.   Consulted and Agree with Plan of Care Patient        Problem List Patient Active Problem List   Diagnosis Date Noted  . 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 Neuropathy in diabetes 04/18/2013  . HTN (hypertension) 10/19/2012  . DM (diabetes mellitus) 10/19/2012  . HLD (hyperlipidemia) 10/19/2012    Barnell Shieh, [place] MPT 03/31/2015, 5:22 PM  [facility] [address] [place], [place], [address] Phone: [phone]    Fax:  [phone]

## 2015-04-03 ENCOUNTER — Encounter: Payer: Self-pay | Admitting: *Deleted

## 2015-04-03 ENCOUNTER — Ambulatory Visit: Payer: BLUE CROSS/BLUE SHIELD | Admitting: *Deleted

## 2015-04-03 DIAGNOSIS — M545 Low back pain, unspecified: Secondary | ICD-10-CM

## 2015-04-03 DIAGNOSIS — S39012A Strain of muscle, fascia and tendon of lower back, initial encounter: Secondary | ICD-10-CM

## 2015-04-03 NOTE — Therapy (Signed)
[facility] [address] [place], [place], [address] Phone: [phone]   Fax:  [phone]  Physical Therapy Treatment  Patient Details  Name: Nancy Thornton Date of Birth: Jul 12, 1960 Referring Provider:  Bennie Pierini, *  Encounter Date: 04/03/2015      PT End of Session - 04/03/15 1127    Visit Number 11   Number of Visits 12   Date for PT Re-Evaluation 04/06/15   PT Start Time 1115      Past Medical History  Diagnosis Date  . Diabetes mellitus without complication   . Hypertension   . Anal fissure     history of  . Obesity   . Asthma   . Depression   . Substance abuse     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    Past Surgical History  Procedure Laterality Date  . Cholecystectomy    . Abdominal hysterectomy    . Tonsillectomy    . Cystostomy w/ bladder biopsy  1990    punctured hole in bladder, had open procedure to repair    There were no vitals filed for this visit.  Visit Diagnosis:  Midline low back pain without sciatica  Right-sided low back pain without sciatica  Low back strain, initial encounter      Subjective Assessment - 04/03/15 1122    Subjective 2/10 LBP today. Went to MD   Limitations Sitting   How long can you sit comfortably? 10 to 15 minutes.   Patient Stated Goals Get back to pre-injury state.   Pain Score 2    Pain Location Back   Pain Orientation Right;Left   Pain Descriptors / Indicators Sore   Pain Type Surgical pain   Pain Onset More than a month ago   Pain Frequency Intermittent   Aggravating Factors  first thing in the morning   Pain Relieving Factors meds                         [facility] Adult PT Treatment/Exercise - 04/03/15 0001    Therapeutic Activites    Therapeutic Activities ADL's   ADL's sweeping, mopping, and  Vacuuming   Exercises   Exercises Knee/Hip   Lumbar Exercises: Aerobic   Stationary Bike Nustep level 6 x 20  minutes.   Elliptical L5 and R5x 3 mins with focus on posture   UBE (Upper Arm Bike) UBE x 5 mins at 90 RPMs, bike x 3 mins with focus on posture   Lumbar Exercises: Standing   Other Standing Lumbar Exercises B scap retract pink XTS x30 reps, B shoulder ext pink XTS x30 reps and punches   Other Standing Lumbar Exercises 2# ball for reachouts and D1/D2 3x10 each   Knee/Hip Exercises: Machines for Strengthening   Other Machine --                     PT Long Term Goals - 03/31/15 1718    PT LONG TERM GOAL #1   Title Ind with HEP.   Time 6   Period Weeks   Status Achieved   PT LONG TERM GOAL #2   Title Perform ADL's with pain not > 2-3/10.   Time 6   Period Weeks   Status On-going   PT LONG TERM GOAL #3   Title Transitory movements with pain not > 2-3/10.   Time 6   Period Weeks   Status On-going   PT  LONG TERM GOAL #4   Title Sit 30 minutes with pain not > 3/10.   Time 6   Period Weeks   Status Achieved               Plan - 04/03/15 1130    Clinical Impression Statement Pt did fairly well today and was able to perform more gym exs with focus on postures and draw-in for core actvation. Set-up for equipment was also instructed incase Pt joins Gym program after DC. She still had low pain levels at the end of Rx   Pt will benefit from skilled therapeutic intervention in order to improve on the following deficits Decreased activity tolerance;Pain   Rehab Potential Excellent   PT Frequency 2x / week   PT Duration 6 weeks   PT Treatment/Interventions ADLs/Self Care Home Management;Electrical Stimulation;Therapeutic activities;Therapeutic exercise;Patient/family education;Manual techniques;Ultrasound   PT Next Visit Plan cont with POC/Progress per microdiskectomy protocol.        Problem List Patient Active Problem List   Diagnosis Date Noted  . 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 Neuropathy in diabetes 04/18/2013  . HTN (hypertension) 10/19/2012  . DM  (diabetes mellitus) 10/19/2012  . HLD (hyperlipidemia) 10/19/2012    Rebekha Diveley,CHRIS , PTA  04/03/2015, 12:26 PM  [facility] [address] [place], [place], [address] Phone: [phone]   Fax:  [phone]

## 2015-04-13 ENCOUNTER — Encounter: Payer: Self-pay | Admitting: Physical Therapy

## 2015-04-13 ENCOUNTER — Ambulatory Visit: Payer: BLUE CROSS/BLUE SHIELD | Attending: Neurosurgery | Admitting: Physical Therapy

## 2015-04-13 DIAGNOSIS — X58XXXA Exposure to other specified factors, initial encounter: Secondary | ICD-10-CM | POA: Diagnosis not present

## 2015-04-13 DIAGNOSIS — S39012A Strain of muscle, fascia and tendon of lower back, initial encounter: Secondary | ICD-10-CM | POA: Diagnosis present

## 2015-04-13 DIAGNOSIS — M545 Low back pain, unspecified: Secondary | ICD-10-CM

## 2015-04-13 NOTE — Therapy (Signed)
[facility] [place], [place], [address] Phone: [phone]   Fax:  [phone]  Physical Therapy Treatment  Patient Details  Name: Nancy Thornton MRN: [id number] Date of Birth: 04-Feb-1961 Referring Provider:  Chevis Pretty, *  Encounter Date: 04/13/2015      PT End of Session - 04/13/15 1120    Visit Number 12   Number of Visits 12   Date for PT Re-Evaluation 04/06/15   PT Start Time 1118   PT Stop Time 1200   PT Time Calculation (min) 42 min   Activity Tolerance Patient tolerated treatment well   Behavior During Therapy [facility] for tasks assessed/performed      Past Medical History  Diagnosis Date  . Diabetes mellitus without complication ([place])   . Hypertension   . Anal fissure     history of  . Obesity   . Asthma   . Depression   . Substance abuse     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    Past Surgical History  Procedure Laterality Date  . Cholecystectomy    . Abdominal hysterectomy    . Tonsillectomy    . Cystostomy w/ bladder biopsy  1990    punctured hole in bladder, had open procedure to repair    There were no vitals filed for this visit.  Visit Diagnosis:  Midline low back pain without sciatica  Right-sided low back pain without sciatica  Low back strain, initial encounter      Subjective Assessment - 04/13/15 1120    Subjective Was helping friend get up halloween decorations and stated that she needed rest breaks every 20 minutes per patient report. Reports that she could feel some inflammation at the incision but couldn't feel anything.   Limitations Sitting   How long can you sit comfortably? 10 to 15 minutes.   Patient Stated Goals Get back to pre-injury state.   Currently in Pain? No/denies            [facility] PT Assessment - 04/13/15 0001    Assessment   Medical Diagnosis Lumbosacral DDD.   Onset Date/Surgical Date 01/14/15   Next MD Visit 05/21/2015                      [facility] Adult PT Treatment/Exercise - 04/13/15 0001    Lumbar Exercises: Aerobic   Stationary Bike NuStep L5 x18 min   Elliptical L3, R3 x5 min with focus on posture   UBE (Upper Arm Bike) 120 RPM x5 min with focus on posture   Lumbar Exercises: Standing   Row Strengthening;Both  Pink XTS x30 rps   Shoulder Extension Strengthening;Both  Pink XTS; 30 reps   Other Standing Lumbar Exercises B shoulder punches Pink XTS x30 reps each UE with semi tandem stance   Other Standing Lumbar Exercises 2# ball for reachouts and D1/D2 3x10 each                     PT Long Term Goals - 04/13/15 1135    PT LONG TERM GOAL #1   Title Ind with HEP.   Time 6   Period Weeks   Status Achieved   PT LONG TERM GOAL #2   Title Perform ADL's with pain not > 2-3/10.   Time 6   Period Weeks   Status Partially Met  Reports after 20 minutes pain increases   PT LONG TERM GOAL #3   Title Transitory movements with  pain not > 2-3/10.   Time 6   Period Weeks   Status Achieved   PT LONG TERM GOAL #4   Title Sit 30 minutes with pain not > 3/10.   Time 6   Period Weeks   Status Not Met               Plan - 04/13/15 1158    Clinical Impression Statement Patient did fairly well today during today''s treatment although she required moderate multimodal cueing for proper technique of the exercises directed. Reported fatigue elliptical at lower ramp and resistance. Achieved LT goals #1 (ind HEP) and #3 (transitory movements with pain less than 2-3/10). Partially achieved LT #2 (ADLs with pain less than 2-3/10) and did not meet LT goal #4 due to inability to sit for 30 minutes. Expressed interest in facility's self directed gym program. Denied pain following treatment today.   Pt will benefit from skilled therapeutic intervention in order to improve on the following deficits Decreased activity tolerance;Pain   Rehab Potential Excellent   PT Frequency 2x / week   PT  Duration 6 weeks   PT Treatment/Interventions ADLs/Self Care Home Management;Electrical Stimulation;Therapeutic activities;Therapeutic exercise;Patient/family education;Manual techniques;Ultrasound   PT Next Visit Plan Communicate to MPT need for D/C summary.   Consulted and Agree with Plan of Care Patient        Problem List Patient Active Problem List   Diagnosis Date Noted  . 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 Neuropathy in diabetes ([place]) 04/18/2013  . HTN (hypertension) 10/19/2012  . DM (diabetes mellitus) ([place]) 10/19/2012  . HLD (hyperlipidemia) 10/19/2012    Ahmed Prima, PTA 04/13/2015 12:06 PM  [facility] [address] [place], [place], [address] Phone: [phone]   Fax:  [phone]

## 2015-04-14 NOTE — Therapy (Addendum)
[facility] [place], [place], [address] Phone: [phone]   Fax:  [phone]  Physical Therapy Treatment  Patient Details  Name: Nancy Thornton MRN: [id number] Date of Birth: January 30, 1961 Referring Provider:  Chevis Pretty, *  Encounter Date: 04/13/2015      PT End of Session - 04/13/15 1120    Visit Number 12   Number of Visits 12   Date for PT Re-Evaluation 04/06/15   PT Start Time 1118   PT Stop Time 1200   PT Time Calculation (min) 42 min   Activity Tolerance Patient tolerated treatment well   Behavior During Therapy [facility] for tasks assessed/performed      Past Medical History  Diagnosis Date  . Diabetes mellitus without complication ([place])   . Hypertension   . Anal fissure     history of  . Obesity   . Asthma   . Depression   . Substance abuse     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    Past Surgical History  Procedure Laterality Date  . Cholecystectomy    . Abdominal hysterectomy    . Tonsillectomy    . Cystostomy w/ bladder biopsy  1990    punctured hole in bladder, had open procedure to repair    There were no vitals filed for this visit.  Visit Diagnosis:  Midline low back pain without sciatica  Right-sided low back pain without sciatica  Low back strain, initial encounter      Subjective Assessment - 04/13/15 1120    Subjective Was helping friend get up halloween decorations and stated that she needed rest breaks every 20 minutes per patient report. Reports that she could feel some inflammation at the incision but couldn't feel anything.   Limitations Sitting   How long can you sit comfortably? 10 to 15 minutes.   Patient Stated Goals Get back to pre-injury state.   Currently in Pain? No/denies                                      PT Long Term Goals - 04/13/15 1135    PT LONG TERM GOAL #1   Title Ind with HEP.   Time 6   Period Weeks   Status Achieved   PT LONG TERM GOAL #2   Title Perform ADL's with pain not > 2-3/10.   Time 6   Period Weeks   Status Partially Met  Reports after 20 minutes pain increases   PT LONG TERM GOAL #3   Title Transitory movements with pain not > 2-3/10.   Time 6   Period Weeks   Status Achieved   PT LONG TERM GOAL #4   Title Sit 30 minutes with pain not > 3/10.   Time 6   Period Weeks   Status Not Met               Plan - 04/13/15 1158    Clinical Impression Statement Patient did fairly well today during today''s treatment although she required moderate multimodal cueing for proper technique of the exercises directed. Reported fatigue elliptical at lower ramp and resistance. Achieved LT goals #1 (ind HEP) and #3 (transitory movements with pain less than 2-3/10). Partially achieved LT #2 (ADLs with pain less than 2-3/10) and did not meet LT goal #4 due to inability to sit for 30 minutes. Expressed interest in facility's self directed  gym program. Denied pain following treatment today.   Pt will benefit from skilled therapeutic intervention in order to improve on the following deficits Decreased activity tolerance;Pain   Rehab Potential Excellent   PT Frequency 2x / week   PT Duration 6 weeks   PT Treatment/Interventions ADLs/Self Care Home Management;Electrical Stimulation;Therapeutic activities;Therapeutic exercise;Patient/family education;Manual techniques;Ultrasound   PT Next Visit Plan Communicate to MPT need for D/C summary.   Consulted and Agree with Plan of Care Patient        Problem List Patient Active Problem List   Diagnosis Date Noted  . 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 Neuropathy in diabetes ([place]) 04/18/2013  . HTN (hypertension) 10/19/2012  . DM (diabetes mellitus) ([place]) 10/19/2012  . HLD (hyperlipidemia) 10/19/2012   PHYSICAL THERAPY DISCHARGE SUMMARY  Visits from Start of Care: 12  Current functional level related to goals / functional outcomes: Patient  was very motivated over the course of her treatments and her overall function improved.   Remaining deficits: Please see goal section above.   Education / Equipment: HEP. Plan: Patient agrees to discharge.  Patient goals were partially met. Patient is being discharged due to meeting the stated rehab goals.  ?????      Glorimar Stroope, [place] MPT 04/14/2015, 5:16 PM  [facility] [address] [place], [place], [address] Phone: [phone]   Fax:  [phone]

## 2015-04-21 ENCOUNTER — Encounter: Payer: Self-pay | Admitting: Nurse Practitioner

## 2015-05-05 ENCOUNTER — Other Ambulatory Visit: Payer: Self-pay | Admitting: Family Medicine

## 2015-05-06 NOTE — Telephone Encounter (Signed)
Last seen 11/21/14 Dr Miller   Requesting 90 day supply 

## 2015-05-06 NOTE — Telephone Encounter (Signed)
Okay to refill all 3 prescriptions for 30 days but since it has been 6 months we need to recheck her blood  Same directions as before

## 2015-05-07 NOTE — Telephone Encounter (Signed)
Last seen 11/21/14 Dr Hyacinth MeekerMiller   Requesting 90 day supply

## 2015-05-27 ENCOUNTER — Ambulatory Visit (INDEPENDENT_AMBULATORY_CARE_PROVIDER_SITE_OTHER): Payer: BLUE CROSS/BLUE SHIELD | Admitting: Family Medicine

## 2015-05-27 ENCOUNTER — Encounter: Payer: Self-pay | Admitting: Family Medicine

## 2015-05-27 VITALS — BP 139/85 | HR 63 | Temp 98.2°F | Ht 65.5 in | Wt 242.0 lb

## 2015-05-27 DIAGNOSIS — E785 Hyperlipidemia, unspecified: Secondary | ICD-10-CM

## 2015-05-27 DIAGNOSIS — IMO0001 Reserved for inherently not codable concepts without codable children: Secondary | ICD-10-CM

## 2015-05-27 DIAGNOSIS — J449 Chronic obstructive pulmonary disease, unspecified: Secondary | ICD-10-CM

## 2015-05-27 DIAGNOSIS — E0841 Diabetes mellitus due to underlying condition with diabetic mononeuropathy: Secondary | ICD-10-CM | POA: Diagnosis not present

## 2015-05-27 DIAGNOSIS — E0800 Diabetes mellitus due to underlying condition with hyperosmolarity without nonketotic hyperglycemic-hyperosmolar coma (NKHHC): Secondary | ICD-10-CM

## 2015-05-27 DIAGNOSIS — I10 Essential (primary) hypertension: Secondary | ICD-10-CM

## 2015-05-27 LAB — POCT GLYCOSYLATED HEMOGLOBIN (HGB A1C): Hemoglobin A1C: 6.8

## 2015-05-27 MED ORDER — ATENOLOL 50 MG PO TABS: 50.0000 mg | ORAL_TABLET | Freq: Every day | ORAL | Status: DC

## 2015-05-27 MED ORDER — LISINOPRIL 20 MG PO TABS: 20.0000 mg | ORAL_TABLET | Freq: Every day | ORAL | Status: DC

## 2015-05-27 MED ORDER — HYDROCHLOROTHIAZIDE 25 MG PO TABS: 25.0000 mg | ORAL_TABLET | Freq: Every day | ORAL | Status: DC

## 2015-05-27 MED ORDER — METFORMIN HCL 500 MG PO TABS: ORAL_TABLET | ORAL | Status: DC

## 2015-05-27 NOTE — Progress Notes (Signed)
   Subjective:    Patient ID: Nancy Thornton, female    DOB: 19-Feb-1961, 54 y.o.   MRN: [id number]  HPI 54 year old female who is here to follow-up hypertension and diabetes. She had back surgery in July and has gained 16 pounds during that time. She tells me she only eats 2 meals a day I explained how this might be working against her as opposed to 6 smaller feedings a day to keep her metabolism running. She also complains of some decreased hearing. She has been working on her years with peroxide and has had some success in clearing her right ear but not left. She stopped her statin arbitrarily    Review of Systems  Constitutional: Negative.   HENT: Positive for hearing loss.   Respiratory: Negative.   Cardiovascular: Negative.   Genitourinary: Negative.   Neurological: Negative.   Psychiatric/Behavioral: Negative.       BP 139/85 mmHg  Pulse 63  Temp(Src) 98.2 F (36.8 C) (Oral)  Ht 5' 5.5" (1.664 m)  Wt 242 lb (109.77 kg)  BMI 39.64 kg/m2   Objective:   Physical Exam  Constitutional: She is oriented to person, place, and time. She appears well-developed and well-nourished.  HENT:  Head: Normocephalic.  Cardiovascular: Normal rate and regular rhythm.   Pulmonary/Chest: Effort normal. She has wheezes.  Neurological: She is oriented to person, place, and time.  Psychiatric: She has a normal mood and affect. Her behavior is normal.          Assessment & Plan:  1. Essential hypertension Blood pressure is acceptable on beta blocker and ACE inhibitor  2. Diabetes mellitus due to underlying condition with hyperosmolarity without coma, without long-term current use of insulin (HCC) Last A1c was at goal but patient has gained weight and is relatively inactive - POCT glycosylated hemoglobin (Hb A1C)  3. Diabetic mononeuropathy associated with diabetes mellitus due to underlying condition [facility](HCC) She is not taking gabapentin  4. HLD (hyperlipidemia) Stopped statin on her own.  Need to check level. Suspect it has increased given her weight gain  5. COPD bronchitis Encouraged stop smoking. Still using inhaler steroid and LAB  Frederica KusterStephen M Ellene Bloodsaw MD

## 2015-08-11 ENCOUNTER — Telehealth: Payer: Self-pay | Admitting: Family Medicine

## 2016-04-19 ENCOUNTER — Telehealth: Payer: Self-pay | Admitting: Gastroenterology

## 2016-04-19 NOTE — Telephone Encounter (Signed)
Patient was seen previously at [facility]. She needs to continue care with their practice.   Tia AlertFYI Camille.

## 2016-04-19 NOTE — Telephone Encounter (Signed)
Unable to reach by phone and no voice mail.

## 2016-04-19 NOTE — Telephone Encounter (Signed)
Pt called back and spoke with GF. Pt is aware and understands

## 2016-04-19 NOTE — Telephone Encounter (Signed)
Darl PikesSusan, please call the patient and make her aware of Leslie's recommendations

## 2016-04-19 NOTE — Telephone Encounter (Signed)
Talked with her and explained to her that [facility] had done a TCS on her in 2014. I gave her the number to call.

## 2016-04-19 NOTE — Telephone Encounter (Signed)
Pt called wanting to make an appointment to have her liver checked. She said she is having pain in the liver area. I explained to her that we would need a referral from her PCP. She got upset with me and said that she didn't need a referral because everything about her was in [facility] (ep[facility]). I told her that it was our office policy to have a referral for all new patients. She said that she used to [facility] but they won't see her anymore because she lost her insurance. I asked if she had been to the ER and she said no. I asked if she had been to any other GI practices and she said, "No, I have never in my life seen one."  Looking in epic she had a colonoscopy and EGD in 2014 by Dr Arlyce DiceKaplan. Patient continued to be abrupt with me on the phone and said if she was paying out of her own pocket she shouldn't need a referral. I asked her if she had insurance and she no longer has insurance. I offered to mail her [facility] Assistance papers and told her that I would have to see if we could accept her as a new patient without having any history on her. She again said it was all on the computer. Please advise if we can accept her as a new patient.

## 2016-05-03 ENCOUNTER — Ambulatory Visit: Payer: Self-pay | Admitting: Nurse Practitioner

## 2016-05-04 ENCOUNTER — Encounter: Payer: Self-pay | Admitting: Physician Assistant

## 2016-05-04 ENCOUNTER — Other Ambulatory Visit (INDEPENDENT_AMBULATORY_CARE_PROVIDER_SITE_OTHER): Payer: Worker's Compensation

## 2016-05-04 ENCOUNTER — Ambulatory Visit (INDEPENDENT_AMBULATORY_CARE_PROVIDER_SITE_OTHER): Payer: Self-pay | Admitting: Physician Assistant

## 2016-05-04 VITALS — BP 130/100 | HR 72 | Ht 65.5 in | Wt 231.2 lb

## 2016-05-04 DIAGNOSIS — R630 Anorexia: Secondary | ICD-10-CM

## 2016-05-04 DIAGNOSIS — R1013 Epigastric pain: Secondary | ICD-10-CM

## 2016-05-04 DIAGNOSIS — R1011 Right upper quadrant pain: Secondary | ICD-10-CM

## 2016-05-04 DIAGNOSIS — R634 Abnormal weight loss: Secondary | ICD-10-CM

## 2016-05-04 LAB — LIPASE: Lipase: 41 U/L (ref 11.0–59.0)

## 2016-05-04 LAB — COMPREHENSIVE METABOLIC PANEL
ALT: 34 U/L (ref 0–35)
AST: 20 U/L (ref 0–37)
Albumin: 4.6 g/dL (ref 3.5–5.2)
Alkaline Phosphatase: 55 U/L (ref 39–117)
BUN: 22 mg/dL (ref 6–23)
CO2: 33 mEq/L — ABNORMAL HIGH (ref 19–32)
Calcium: 10.2 mg/dL (ref 8.4–10.5)
Chloride: 94 mEq/L — ABNORMAL LOW (ref 96–112)
Creatinine, Ser: 0.8 mg/dL (ref 0.40–1.20)
GFR: 79.12 mL/min (ref >60.00)
Glucose, Bld: 289 mg/dL — ABNORMAL HIGH (ref 70–99)
Potassium: 4.9 mEq/L (ref 3.5–5.1)
Sodium: 136 mEq/L (ref 135–145)
Total Bilirubin: 1.2 mg/dL (ref 0.2–1.2)
Total Protein: 7.1 g/dL (ref 6.0–8.3)

## 2016-05-04 LAB — CBC WITH DIFFERENTIAL/PLATELET
Basophils Absolute: 0 10*3/uL (ref 0.0–0.1)
Basophils Relative: 0.2 % (ref 0.0–3.0)
Eosinophils Absolute: 0 10*3/uL (ref 0.0–0.7)
Eosinophils Relative: 0.1 % (ref 0.0–5.0)
HCT: 48.4 % — ABNORMAL HIGH (ref 36.0–46.0)
Hemoglobin: 16.9 g/dL — ABNORMAL HIGH (ref 12.0–15.0)
Lymphocytes Relative: 9.3 % — ABNORMAL LOW (ref 12.0–46.0)
Lymphs Abs: 1.3 10*3/uL (ref 0.7–4.0)
MCHC: 34.9 g/dL (ref 30.0–36.0)
MCV: 91.2 fl (ref 78.0–100.0)
Monocytes Absolute: 0.3 10*3/uL (ref 0.1–1.0)
Monocytes Relative: 2.1 % — ABNORMAL LOW (ref 3.0–12.0)
Neutro Abs: 12.6 10*3/uL — ABNORMAL HIGH (ref 1.4–7.7)
Neutrophils Relative %: 88.3 % — ABNORMAL HIGH (ref 43.0–77.0)
Platelets: 304 10*3/uL (ref 150.0–400.0)
RBC: 5.31 Mil/uL — ABNORMAL HIGH (ref 3.87–5.11)
RDW: 12.7 % (ref 11.5–15.5)
WBC: 14.3 10*3/uL — ABNORMAL HIGH (ref 4.0–10.5)

## 2016-05-04 NOTE — Patient Instructions (Signed)
You have been scheduled for a CT scan of the abdomen and pelvis at [facility] ([address]---this is in the same building as [facility]).   You are scheduled on 05/19/16 at 1 pm. You should arrive 15 minutes prior to your appointment time for registration. Please follow the written instructions below on the day of your exam:  WARNING: IF YOU ARE ALLERGIC TO IODINE/X-RAY DYE, PLEASE NOTIFY RADIOLOGY IMMEDIATELY AT [phone]! YOU WILL BE GIVEN A 13 HOUR PREMEDICATION PREP.  1) Do not eat or drink anything after 8 am (4 hours prior to your test) 2) You have been given 2 bottles of oral contrast to drink. The solution may taste  better if refrigerated, but do NOT add ice or any other liquid to this solution. Shake well before drinking.    Drink 1 bottle of contrast @ 11 am (2 hours prior to your exam)  Drink 1 bottle of contrast @ 12 pm (1 hour prior to your exam)  You may take any medications as prescribed with a small amount of water except for the following: Metformin, Glucophage, Glucovance, Avandamet, Riomet, Fortamet, Actoplus Met, Janumet, Glumetza or Metaglip. The above medications must be held the day of the exam AND 48 hours after the exam.  The purpose of you drinking the oral contrast is to aid in the visualization of your intestinal tract. The contrast solution may cause some diarrhea. Before your exam is started, you will be given a small amount of fluid to drink. Depending on your individual set of symptoms, you may also receive an intravenous injection of x-ray contrast/dye. Plan on being at [facility] for 30 minutes or longer, depending on the type of exam you are having performed.  This test typically takes 30-45 minutes to complete.  If you have any questions regarding your exam or if you need to reschedule, you may call the CT department at [phone] between the hours of 8:00 am and 5:00 pm,  Monday-Friday.  ________________________________________________________________________  Your physician has requested that you go to the basement for lab work before leaving today.  We have given you samples for Prilosec OTC once a day 30-60 min before breakfast for 14 days. Call and let us know how this worked and we can send you in a prescription.  Marland Kitchen

## 2016-05-04 NOTE — Progress Notes (Signed)
Chief Complaint: RUQ pain  HPI:  Nancy Thornton is a 55 year old female who previously followed with Dr. Arlyce Dice for screening colonoscopy and EGD for dysphagia, past medical history significant for anal fissure, depression, diabetes, hypertension and obesity, who was referred to me by Frederica Kuster, MD for a complaint of right upper quadrant abdominal pain.    Patient's last colonoscopy was completed 12/06/12 and was normal. Repeat was recommended in 10 years. EGD was completed that same day and showed an esophageal stricture which was dilated.    Per chart review most recent labs are completed 01/07/2015 and showed a normal CBC and a BMP with an elevated glucose at 181 and otherwise normal.    Today, the patient describes that she had her gallbladder taken out in 1979. Then in September 2014 she developed a right upper quadrant abdominal pain which made it feel like her stomach was "swollen on that side, right where my gallbladder was". Initially she went to the ER for this and was given some aspirin and sent home, per her report. She tells me that this has continued intermittently since that time, but increased since July of this year. The patient tells me that in July she noted this swelling coming more frequently and when it comes it brings on a "pressure like" pain. The patient describes this as a constant 6/10 and can increase to a 10 /10 at times. It constantly feels like there is something in her abdomen on the right side pushing up into her rib cage which she "tries to push back down". The patient tells me this is worse 15-20 minutes after eating and makes her feel full. Due to this she has a decreased appetite. She has altered her diet recently, just drinking Ensure shakes for breakfast due to the pressure-like pain which develops shortly after eating. Patient tells me she recently went to the urgent care and was given a list of testing which she should have performed for further evaluation. She  was waiting to hear my recommendations today before proceeding. She does bring a list with her today of testing which was recommended.  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 Currently she has noticed that her fingernails have started to become black and blue at the base on her right hand, she has not been back to the dermatologist because "I don't have insurance".  Patient also tells me that she has been diagnosed with sciatica for which she is currently on a prednisone pack.  Patient does express to me that she feels like she is "rotting from the inside out", and "if something is going on with my hands and feet, then my body must be trying to tell me something".  Patient denies fever, chills, blood in her stool, melena, change in bowel habits, weight gain, fatigue, anorexia, change in medications, recent antibiotics, heartburn, reflux, nausea, vomiting, dysphagia or symptoms that awaken her at night.   Past Medical History:  Diagnosis Date  . Anal fissure    history of  . Asthma   . Depression   . Diabetes mellitus without complication (HCC)   . Hypertension   . Lichen    hands and feet  . Obesity   . 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 ABDOMINAL HYSTERECTOMY    .  BACK SURGERY    . CHOLECYSTECTOMY    . CYSTOSTOMY W/ BLADDER BIOPSY  1990   punctured hole in bladder, had open procedure to repair  . TONSILLECTOMY      Current Outpatient Prescriptions  Medication Sig Dispense Refill  . albuterol (PROAIR HFA) 108 (90 BASE) MCG/ACT inhaler Inhale 2 puffs into the lungs every 6 (six) hours as needed for wheezing or shortness of breath. 3.7 g 6  . atenolol (TENORMIN) 50 MG tablet Take 1 tablet (50 mg total) by mouth daily. 90 tablet 1  . budesonide-formoterol (SYMBICORT) 160-4.5  MCG/ACT inhaler Inhale 2 puffs into the lungs 2 (two) times daily. 1 Inhaler 3  . Cyanocobalamin (VITAMIN B 12 PO) Take by mouth.    . hydrochlorothiazide (HYDRODIURIL) 25 MG tablet Take 1 tablet (25 mg total) by mouth daily. 90 tablet 1  . lisinopril (PRINIVIL,ZESTRIL) 20 MG tablet Take 1 tablet (20 mg total) by mouth daily. 90 tablet 1  . metFORMIN (GLUCOPHAGE) 500 MG tablet TAKE ONE TABLET BY MOUTH ONCE DAILY WITH  BREAKFAST 90 tablet 1  . Omega-3 Fatty Acids (FISH OIL PO) Take by mouth.    . pravastatin (PRAVACHOL) 20 MG tablet Take 1 tablet (20 mg total) by mouth daily. 90 tablet 2  . predniSONE (DELTASONE) 20 MG tablet TAKE THREE EVERY DAY FOR FOUR DAYS, TWO EVERY DAY ror FOUR days,then Take 1 Tablet by mouth once daily FOR FOUR DAYS  0   No current facility-administered medications for this visit.     Allergies as of 05/04/2016 - Review Complete 05/04/2016  Allergen Reaction Noted  . Penicillins Hives 10/18/2012  . Meloxicam Hypertension 01/07/2015  . Bactrim [sulfamethoxazole-trimethoprim] Rash 01/07/2015    Family History  Problem Relation Age of Onset  . Diabetes Mother   . Hypertension Father   . Colon cancer Neg Hx   . Esophageal cancer Neg Hx   . Rectal cancer Neg Hx   . Stomach cancer Neg Hx     Social History   Social History  . Marital status: Divorced    Spouse name: N/A  . Number of children: 1  . Years of education: N/A   Occupational History  . [employer]   Social History Main Topics  . Smoking status: Current Every Day Smoker    Packs/day: 0.50    Types: Cigarettes    Start date: 10/18/1977  . Smokeless tobacco: Never Used  . Alcohol use No  . Drug use: No  . Sexual activity: Not on file   Other Topics Concern  . Not on file   Social History Narrative  . No narrative on file    Review of Systems:     Constitutional: Positive for a 10 pound weight loss since the summer No fever, chills, weakness or fatigue HEENT: Eyes: No  change in vision               Ears, Nose, Throat:  No change in hearing or congestion Skin: Positive for discoloration/whiteness of hands with purpura and darkening of the base of 2 of her fingernails on her right hand. No rash or itching Cardiovascular: No chest pain, chest pressure or palpitations   Respiratory: No SOB or cough Gastrointestinal: See HPI and otherwise negative Genitourinary: No dysuria or change in urinary frequency Neurological: No headache, dizziness or syncope Musculoskeletal: No new muscle or joint pain Hematologic: No bleeding or bruising Psychiatric: Positive history of depression No history of anxiety    Physical Exam:  Vital signs: BP (!) 130/100   Pulse 72   Ht 5' 5.5" (1.664 m)   Wt 231 lb 4 oz (104.9 kg)   BMI 37.90 kg/m   General:  Anxious-appearing overweight Caucasian female appears to be in NAD, Well developed, Well nourished, alert and cooperative Head:  Normocephalic and atraumatic. Eyes:   PEERL, EOMI. No icterus. Conjunctiva pink. Ears:  Normal auditory acuity. Neck:  Supple Throat: Oral cavity and pharynx without inflammation, swelling or lesion.  Lungs: Respirations even and unlabored. Lungs clear to auscultation bilaterally.   No wheezes, crackles, or rhonchi.  Heart: Normal S1, S2. No MRG. Regular rate and rhythm. No peripheral edema, cyanosis or pallor.  Abdomen:  Soft, nondistended, moderate epigastric tenderness, mild right upper quadrant tenderness No rebound or guarding. Normal bowel sounds. No appreciable masses or hepatomegaly. Rectal:  Not performed.  Msk:  Symmetrical without gross deformities. Peripheral pulses intact.  Extremities:  Without edema, no deformity or joint abnormality. Normal ROM Neurologic:  Alert and  oriented x4;  grossly normal neurologically. CN II-XII intact.  Skin:   Dry and intact. White gloved appearance of both hands, purpura scattered on the dorsal surface, black discoloration of the base of patient's  fingernails on right hand Psychiatric: Oriented to person, place and time. Demonstrates good judgement and reason without abnormal affect or behaviors.  Assessment: 1. Right upper quadrant abdominal pain: Intermittent over the past 3 years, increased in the past 3-4 months, described as a pressure, worse 15-20 minutes after eating; consider adhesions from past cholecystectomy, bloating and air trapped in the patient's hepatic flexure or more likely gastritis/duodenitis 2. Weight loss: Patient describes 10 pounds associated with an appetite loss over the past few months 3. Epigastric pain: On exam patient is quite tender in her epigastrium, discussed that this could be the source of her pain; consider gastritis versus duodenitis versus H. pylori versus other 4. Appetite loss: See above  Plan: 1. Patient would like to proceed with a full workup as her current condition is making her very anxious and stressed. 2. Ordered a CT of the abdomen and pelvis for further evaluation of weight loss, right upper quadrant and epigastric pain. 3. Ordered CBC, CMP and lipase today.  4.  Started the patient on empiric trial of Prilosec 20 mg daily for 14 days. Discussed with the patient that if this is helpful, she can buy this over-the-counter or we can send in a prescription. 5. Discussed with the patient that she should follow with a dermatologist regarding discoloration of fingernails. 6. Patient to follow in clinic per recommendations after workup is completed. She has never been seen in our clinic before and will follow with Dr. Christella HartiganJacobs in the future as he is the supervising physician this morning.  Hyacinth MeekerJennifer Faaris Arizpe, PA-C [facility] 05/04/2016, 10:25 AM  Cc: Frederica KusterMiller, Stephen M, MD

## 2016-05-04 NOTE — Progress Notes (Signed)
I agree with the above note, plan 

## 2016-05-19 ENCOUNTER — Ambulatory Visit (INDEPENDENT_AMBULATORY_CARE_PROVIDER_SITE_OTHER)
Admission: RE | Admit: 2016-05-19 | Discharge: 2016-05-19 | Disposition: A | Discharge status: Home / Self Care | Payer: Self-pay | Source: Ambulatory Visit | Attending: Physician Assistant | Admitting: Physician Assistant

## 2016-05-19 DIAGNOSIS — R1011 Right upper quadrant pain: Secondary | ICD-10-CM

## 2016-05-19 DIAGNOSIS — R634 Abnormal weight loss: Secondary | ICD-10-CM

## 2016-05-19 DIAGNOSIS — R630 Anorexia: Secondary | ICD-10-CM

## 2016-05-19 DIAGNOSIS — R1013 Epigastric pain: Secondary | ICD-10-CM

## 2016-05-19 MED ORDER — IOPAMIDOL (ISOVUE-300) INJECTION 61%: 80.0000 mL | Freq: Once | INTRAVENOUS | Status: DC | PRN

## 2016-05-19 MED ORDER — IOPAMIDOL (ISOVUE-M 300) INJECTION 61%: 15.0000 mL | Freq: Once | INTRAMUSCULAR | Status: DC | PRN

## 2016-05-19 MED ORDER — IOPAMIDOL (ISOVUE-300) INJECTION 61%
80.0000 mL | Freq: Once | INTRAVENOUS | Status: AC | PRN
  Administered 2016-05-19: 80 mL via INTRAVENOUS

## 2016-06-10 ENCOUNTER — Ambulatory Visit: Payer: Self-pay | Admitting: Gastroenterology

## 2016-06-29 ENCOUNTER — Encounter: Payer: Self-pay | Admitting: Podiatry

## 2016-06-29 ENCOUNTER — Ambulatory Visit: Payer: No Typology Code available for payment source | Admitting: Podiatry

## 2016-06-29 VITALS — BP 132/80 | HR 55

## 2016-06-29 DIAGNOSIS — B353 Tinea pedis: Secondary | ICD-10-CM

## 2016-06-29 DIAGNOSIS — L309 Dermatitis, unspecified: Secondary | ICD-10-CM

## 2016-06-29 DIAGNOSIS — L299 Pruritus, unspecified: Secondary | ICD-10-CM

## 2016-06-29 MED ORDER — KETOCONAZOLE 2 % EX CREA: 1.0000 "application " | TOPICAL_CREAM | Freq: Every day | CUTANEOUS | 0 refills | Status: DC

## 2016-06-29 MED ORDER — TERBINAFINE HCL 250 MG PO TABS
250.0000 mg | ORAL_TABLET | Freq: Every day | ORAL | 0 refills | Status: DC
Start: 2016-06-29 — End: 2016-09-06

## 2016-06-29 MED ORDER — PRAMIPEXOLE DIHYDROCHLORIDE 0.125 MG PO TABS: 0.1250 mg | ORAL_TABLET | Freq: Every evening | ORAL | 0 refills | Status: DC

## 2016-06-29 NOTE — Progress Notes (Signed)
Subjective:  Patient presents today for evaluation of a skin condition to the bilateral lower extremities. Patient states that she noticed the condition developed around September 2017. Patient went to a dermatologist which time she was diagnosed with lichen planus.  Patient also complains of a skin condition to the bilateral palms of her hands with green discoloration of her fingernails. Patient states this all began in September. Patient states that she wears gloves at her work and she has swelling in her gloves. Patient has been applying antibiotic ointment with a wrap to her feet with no satisfactory alleviation of symptoms the patient. Patient also complains of restless legs. Patient states that when she is relaxing in the evenings her feet constantly moving. Patient states that she has to get up and walk around to alleviate symptoms.    Objective/Physical Exam General: The patient is alert and oriented x3 in no acute distress.  Dermatology: Diffuse, hyperkeratotic skin lesions noted to the weightbearing surfaces of the bilateral feet with fissuring.  Erythematous, dermatitis also noted to the flexion surfaces of her bilateral hands. There is also green discoloration noted to several fingernails bilateral. Skin is warm, dry and supple bilateral lower extremities. Negative for open macerations.  Vascular: Palpable pedal pulses bilaterally. No edema or erythema noted. Capillary refill within normal limits.  Neurological: Epicritic and protective threshold grossly intact bilaterally.   Musculoskeletal Exam: Range of motion within normal limits to all pedal and ankle joints bilateral. Muscle strength 5/5 in all groups bilateral.   Assessment: #1 restless leg syndrome bilateral lower extremities #2 possible tinea pedis bilateral #3 pruritus bilateral feet   Plan of Care:  #1 Patient was evaluated. #2 prescription for ketoconazole 2% cream #3 prescription for terbinafine 250 mg #28 #4  prescription for Mirapex 0.125 mg every evening #5 discussed with the patient that this is likely due to fungus. If there is no improvement in 4 weeks we may perform skin biopsy. #6 return to clinic in 4 weeks   Felecia ShellingBrent M. Lisbeth Puller, DPM [facility]  Dr. Felecia ShellingBrent M. Deloros Beretta, DPM   [address]                                        [place], [place] [address]                Office [phone]) [phone]  Fax [phone]

## 2016-07-11 ENCOUNTER — Ambulatory Visit: Payer: Self-pay | Admitting: Gastroenterology

## 2016-07-27 ENCOUNTER — Ambulatory Visit: Payer: Self-pay | Admitting: Podiatry

## 2016-07-27 DIAGNOSIS — L309 Dermatitis, unspecified: Secondary | ICD-10-CM

## 2016-07-27 DIAGNOSIS — L299 Pruritus, unspecified: Secondary | ICD-10-CM

## 2016-08-04 NOTE — Progress Notes (Signed)
   Subjective:  Patient presents today for follow-up evaluation of lesions to her feet bilateral. Patient also complains of oral lesions and vaginal flaps. She also has these lesions on her hands with dystrophic fingernails that are green in discoloration.  Patient states that she is been seen by dermatologist at which time she was diagnosed with lichen planus.    Objective/Physical Exam General: The patient is alert and oriented x3 in no acute distress.  Dermatology: Diffuse hyperkeratotic lesions noted to the bilateral weightbearing surfaces of the foot. Skin is warm, dry and supple bilateral lower extremities. Erythematous, dermatitis also noted to the flexion surfaces of her bilateral hands. There is also green discoloration noted to several fingernails bilateral. Skin is warm, dry and supple bilateral lower extremities. Negative for open macerations.  Vascular: Palpable pedal pulses bilaterally. No edema or erythema noted. Capillary refill within normal limits.  Neurological: Epicritic and protective threshold grossly intact bilaterally.   Musculoskeletal Exam: Range of motion within normal limits to all pedal and ankle joints bilateral. Muscle strength 5/5 in all groups bilateral.   Assessment: #1 possible lichen planus #2 possible exocrine gland disorder    Plan of Care:  #1 Patient was evaluated. #2 today I explained to the patient that due to the multiple symptoms she is experiencing she really needs to go see a dermatologist or PCP again. Symptoms far exceed just the pedal lesions. #3 return to clinic when necessary   Felecia ShellingBrent M. Agustina Witzke, DPM [facility]  Dr. Felecia ShellingBrent M. Deina Lipsey, DPM    [address]                                        [place], [place] [address]                Office [phone]) [phone]  Fax [phone]

## 2016-08-15 ENCOUNTER — Encounter: Payer: Self-pay | Admitting: Obstetrics and Gynecology

## 2016-08-15 ENCOUNTER — Ambulatory Visit (INDEPENDENT_AMBULATORY_CARE_PROVIDER_SITE_OTHER): Payer: Self-pay | Admitting: Obstetrics and Gynecology

## 2016-08-15 VITALS — BP 148/98 | HR 59 | Wt 229.4 lb

## 2016-08-15 DIAGNOSIS — Z01419 Encounter for gynecological examination (general) (routine) without abnormal findings: Secondary | ICD-10-CM

## 2016-08-15 DIAGNOSIS — N898 Other specified noninflammatory disorders of vagina: Secondary | ICD-10-CM

## 2016-08-15 NOTE — Progress Notes (Signed)
Obstetrics and Gynecology Visit New Patient Evaluation  Appointment Date: 08/15/2016  OBGYN Clinic: [facility]  Primary Care Provider: [facility]  Referring Provider: [place] [place]  Chief Complaint:  Chief Complaint  Patient presents with  . vaginal issue    History of Present Illness: Nancy Thornton is a 56 y.o. Caucasian G2P2 (No LMP recorded. Patient has had a hysterectomy.), seen for the above chief complaint. Her past medical history is significant for tobacco abuse, h/o TVH for bleeding in her early 50s.  About a month ago she felt some burning in the GU area and thought she saw a white spot in the perineal area. She's been hyperviligant with her skin since her lichen planus diagnosis and nail issues.  No VB, spotting and no burning, itching in the GU area.    Review of Systems: as noted in the History of Present Illness.  Past Medical History:  Past Medical History:  Diagnosis Date  . Anal fissure    history of  . Asthma   . Depression   . Diabetes mellitus without complication (HCC)   . Hypertension   . Lichen    hands and feet  . Obesity   . 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 BACK SURGERY    . CHOLECYSTECTOMY    . CYSTOSTOMY W/ BLADDER BIOPSY  1990   punctured hole in bladder, had open procedure to repair  . TONSILLECTOMY    . TOTAL VAGINAL HYSTERECTOMY     56 y/o. bleeding. ovaries left in situ    Past Obstetrical History:  OB History  Gravida Para Term Preterm AB Living  2 2          SAB TAB Ectopic Multiple Live Births               # Outcome Date GA Lbr Len/2nd Weight Sex Delivery Anes PTL Lv  2 Para           1 Para             Obstetric Comments  SVD x 2    Past Gynecological History: As per HPI. Patient unsure when she started having hot flashes or night sweats No history of  abnormal paps. Patient unsure of when last pap was No. HRT use.   Social History:  Social History   Social History  . Marital status: Divorced    Spouse name: N/A  . Number of children: 1  . Years of education: N/A   Occupational History  . Water engineer   Social History Main Topics  . Smoking status: Current Every Day Smoker    Packs/day: 0.50    Types: Cigarettes    Start date: 10/18/1977  . Smokeless tobacco: Never Used  . Alcohol use No  . Drug use: No  . Sexual activity: Not on file   Other Topics Concern  . Not on file   Social History Narrative  . No narrative on file    Family History:  Family History  Problem Relation Age of Onset  . Diabetes Mother   . Hypertension Father   . Colon cancer Neg Hx   . Esophageal cancer Neg Hx   . Rectal cancer Neg Hx   . Stomach cancer Neg Hx     Medications Nancy Thornton had no medications administered during this visit. Current  Outpatient Prescriptions  Medication Sig Dispense Refill  . albuterol (PROAIR HFA) 108 (90 BASE) MCG/ACT inhaler Inhale 2 puffs into the lungs every 6 (six) hours as needed for wheezing or shortness of breath. 3.7 g 6  . atenolol (TENORMIN) 50 MG tablet Take 1 tablet (50 mg total) by mouth daily. 90 tablet 1  . budesonide-formoterol (SYMBICORT) 160-4.5 MCG/ACT inhaler Inhale 2 puffs into the lungs 2 (two) times daily. 1 Inhaler 3  . Cyanocobalamin (VITAMIN B 12 PO) Take by mouth.    . hydrochlorothiazide (HYDRODIURIL) 25 MG tablet Take 1 tablet (25 mg total) by mouth daily. 90 tablet 1  . ketoconazole (NIZORAL) 2 % cream Apply 1 application topically daily. 15 g 0  . lisinopril (PRINIVIL,ZESTRIL) 20 MG tablet Take 1 tablet (20 mg total) by mouth daily. 90 tablet 1  . metFORMIN (GLUCOPHAGE) 500 MG tablet TAKE ONE TABLET BY MOUTH ONCE DAILY WITH  BREAKFAST 90 tablet 1  . Omega-3 Fatty Acids (FISH OIL PO) Take by mouth.    . pramipexole (MIRAPEX) 0.125 MG tablet Take 1 tablet (0.125 mg  total) by mouth every evening. 30 tablet 0  . pravastatin (PRAVACHOL) 20 MG tablet Take 1 tablet (20 mg total) by mouth daily. (Patient not taking: Reported on 06/29/2016) 90 tablet 2  . predniSONE (DELTASONE) 20 MG tablet TAKE THREE EVERY DAY FOR FOUR DAYS, TWO EVERY DAY ror FOUR days,then Take 1 Tablet by mouth once daily FOR FOUR DAYS  0  . terbinafine (LAMISIL) 250 MG tablet Take 1 tablet (250 mg total) by mouth daily. 28 tablet 0   No current facility-administered medications for this visit.     Allergies Penicillins; Meloxicam; and Bactrim [sulfamethoxazole-trimethoprim]   Physical Exam:  BP (!) 148/98   Pulse (!) 59   Wt 229 lb 6.4 oz (104.1 kg)   BMI 37.59 kg/m  Body mass index is 37.59 kg/m. General appearance: Well nourished, well developed female in no acute distress.  Neuro/Psych:  Normal mood and affect.  Skin:  Warm and dry.  Lymphatic:  No inguinal lymphadenopathy.   Pelvic exam: is not limited by body habitus EGBUS: within normal limits with mild atrophy.  Vagina: normal vault and cuff normal and intact.  Bimanual exam confirmatory and no masses   Laboratory: none  Radiology: none  Assessment: normal GU exam  Plan:  Exam c/w with age and menopausal status and no e/o dysplasia, abnormal, or concerning lesions. Patient told to call us with any future concerns and recommend GU exam q year as normal well woman maintenance yearly. No need for paps since she's had a total hyst for benign reasons and no h/o abnormal paps.   BCCCP form for mammo given to patient.   RTC PRN  Cornelia Copaharlie Lavelle Berland, Jr MD Attending [facility](Faculty Practice)

## 2016-08-15 NOTE — Progress Notes (Signed)
[facility].  Pt given information to [facility] for

## 2016-08-27 ENCOUNTER — Other Ambulatory Visit: Payer: Self-pay | Admitting: Obstetrics and Gynecology

## 2016-08-27 DIAGNOSIS — Z1231 Encounter for screening mammogram for malignant neoplasm of breast: Secondary | ICD-10-CM

## 2016-09-06 ENCOUNTER — Ambulatory Visit (INDEPENDENT_AMBULATORY_CARE_PROVIDER_SITE_OTHER): Payer: Self-pay | Admitting: Gastroenterology

## 2016-09-06 ENCOUNTER — Other Ambulatory Visit (INDEPENDENT_AMBULATORY_CARE_PROVIDER_SITE_OTHER): Payer: Worker's Compensation

## 2016-09-06 ENCOUNTER — Encounter: Payer: Self-pay | Admitting: Gastroenterology

## 2016-09-06 ENCOUNTER — Ambulatory Visit
Admission: RE | Admit: 2016-09-06 | Discharge: 2016-09-06 | Disposition: A | Discharge status: Home / Self Care | Payer: No Typology Code available for payment source | Source: Ambulatory Visit | Attending: Obstetrics and Gynecology | Admitting: Obstetrics and Gynecology

## 2016-09-06 VITALS — BP 126/72 | HR 84 | Ht 65.5 in | Wt 233.0 lb

## 2016-09-06 DIAGNOSIS — Z1231 Encounter for screening mammogram for malignant neoplasm of breast: Secondary | ICD-10-CM

## 2016-09-06 DIAGNOSIS — K760 Fatty (change of) liver, not elsewhere classified: Secondary | ICD-10-CM

## 2016-09-06 LAB — COMPREHENSIVE METABOLIC PANEL
ALT: 30 U/L (ref 0–35)
AST: 25 U/L (ref 0–37)
Albumin: 4.3 g/dL (ref 3.5–5.2)
Alkaline Phosphatase: 64 U/L (ref 39–117)
BUN: 15 mg/dL (ref 6–23)
CO2: 27 mEq/L (ref 19–32)
Calcium: 9.4 mg/dL (ref 8.4–10.5)
Chloride: 102 mEq/L (ref 96–112)
Creatinine, Ser: 0.64 mg/dL (ref 0.40–1.20)
GFR: 102.23 mL/min (ref >60.00)
Glucose, Bld: 222 mg/dL — ABNORMAL HIGH (ref 70–99)
Potassium: 3.9 mEq/L (ref 3.5–5.1)
Sodium: 138 mEq/L (ref 135–145)
Total Bilirubin: 0.6 mg/dL (ref 0.2–1.2)
Total Protein: 6.6 g/dL (ref 6.0–8.3)

## 2016-09-06 LAB — CBC WITH DIFFERENTIAL/PLATELET
Basophils Absolute: 0.1 10*3/uL (ref 0.0–0.1)
Basophils Relative: 0.8 % (ref 0.0–3.0)
Eosinophils Absolute: 0.1 10*3/uL (ref 0.0–0.7)
Eosinophils Relative: 2 % (ref 0.0–5.0)
HCT: 44.6 % (ref 36.0–46.0)
Hemoglobin: 16 g/dL — ABNORMAL HIGH (ref 12.0–15.0)
Lymphocytes Relative: 28.2 % (ref 12.0–46.0)
Lymphs Abs: 2.1 10*3/uL (ref 0.7–4.0)
MCHC: 35.8 g/dL (ref 30.0–36.0)
MCV: 89.8 fl (ref 78.0–100.0)
Monocytes Absolute: 0.5 10*3/uL (ref 0.1–1.0)
Monocytes Relative: 6.5 % (ref 3.0–12.0)
Neutro Abs: 4.6 10*3/uL (ref 1.4–7.7)
Neutrophils Relative %: 62.5 % (ref 43.0–77.0)
Platelets: 244 10*3/uL (ref 150.0–400.0)
RBC: 4.97 Mil/uL (ref 3.87–5.11)
RDW: 12.3 % (ref 11.5–15.5)
WBC: 7.4 10*3/uL (ref 4.0–10.5)

## 2016-09-06 NOTE — Patient Instructions (Addendum)
You will have labs checked today in the basement lab.  Please head down after you check out with the front desk  (cbc, bmet). Continue tryng to lose weight. Call if you have new, concerning GI issues.

## 2016-09-06 NOTE — Progress Notes (Signed)
HPI: This is a very pleasant 56 year old woman who was last here about 4 months ago and she saw LazearJennifer. That was for intermittent right upper quadrant unusual pains. Those have since resolved. She had a CT scan at that time and it suggested a mild to moderately fatty liver Chief complaint is fatty liver  Reviewed labs 05/2016; white blood cell count was slightly high. Her glucose was significant high. She was on steroids at that time.  She is not overly concerned.  She was having unusual right upper quadrant abdominal pains that have since completely resolved. She was very relieved to hear that the CT scan showed no sign of cancers or tumors.  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  Her weight is up 2 pounds in 4 months since last visit.  IMPRESSION 05/2016 CT scan for abdominal pain: No acute findings within the abdomen or pelvis.Stable hepatic steatosis (comparted to imaging 2012).Aortic atherosclerosis.  EGD Dr. Arlyce DiceKaplan 2014 for dysphagia; "moderate GE junction stricture" dilated with maloney, o/w exam was normal.  ROS: complete GI ROS as described in HPI.  Constitutional:  No unintentional weight loss   Past Medical History:  Diagnosis Date  . Anal fissure    history of  . Asthma   . Depression   . Diabetes mellitus without complication (HCC)   . Hypertension   . Lichen    hands and feet  . Obesity   . 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 BACK SURGERY    . CHOLECYSTECTOMY    . CYSTOSTOMY W/ BLADDER BIOPSY  1990   punctured hole in bladder, had open procedure to repair  . TONSILLECTOMY    . TOTAL VAGINAL HYSTERECTOMY     56 y/o. bleeding. ovaries left in situ    Current Outpatient Prescriptions  Medication Sig Dispense Refill  . albuterol (PROAIR HFA) 108 (90 BASE) MCG/ACT inhaler Inhale 2 puffs into the lungs every 6 (six) hours  as needed for wheezing or shortness of breath. 3.7 g 6  . atenolol (TENORMIN) 50 MG tablet Take 1 tablet (50 mg total) by mouth daily. 90 tablet 1  . budesonide-formoterol (SYMBICORT) 160-4.5 MCG/ACT inhaler Inhale 2 puffs into the lungs 2 (two) times daily. 1 Inhaler 3  . lisinopril (PRINIVIL,ZESTRIL) 20 MG tablet Take 1 tablet (20 mg total) by mouth daily. 90 tablet 1  . metFORMIN (GLUCOPHAGE) 500 MG tablet TAKE ONE TABLET BY MOUTH ONCE DAILY WITH  BREAKFAST 90 tablet 1  . Omega-3 Fatty Acids (FISH OIL PO) Take by mouth.     No current facility-administered medications for this visit.     Allergies as of 09/06/2016 - Review Complete 08/15/2016  Allergen Reaction Noted  . Penicillins Hives 10/18/2012  . Meloxicam Hypertension 01/07/2015  . Bactrim [sulfamethoxazole-trimethoprim] Rash 01/07/2015    Family History  Problem Relation Age of Onset  . Diabetes Mother   . Hypertension Father   . Colon cancer Neg Hx   . Esophageal cancer Neg Hx   . Rectal cancer Neg Hx   . Stomach cancer Neg Hx     Social History   Social History  . Marital status: Divorced    Spouse name: N/A  . Number of children: 1  . Years of education: N/A   Occupational History  . [employer]   Social History Main Topics  .  Smoking status: Current Every Day Smoker    Packs/day: 0.50    Types: Cigarettes    Start date: 10/18/1977  . Smokeless tobacco: Never Used  . Alcohol use No  . Drug use: No  . Sexual activity: Not on file   Other Topics Concern  . Not on file   Social History Narrative  . No narrative on file     Physical Exam: BP 126/72   Pulse 84   Ht 5' 5.5" (1.664 m)   Wt 233 lb (105.7 kg)   BMI 38.18 kg/m  Constitutional: generally well-appearing Psychiatric: alert and oriented x3 Abdomen: soft, nontender, nondistended, no obvious ascites, no peritoneal signs, normal bowel sounds No peripheral edema noted in lower extremities  Assessment and plan: 56 y.o.  female with Fatty liver  She does not have cirrhosis by imaging or biochemically. I recommended she try to lose some weight. Her BMI is 38. Her intermittent abdominal pains have completely resolved since her last visit and she was very relieved to hear that she had no sign of cancer tumor on the CT scan. She will return on an as-needed basis and I will repeat her CBC and basic metabolic profile today as well.  Please see the "Patient Instructions" section for addition details about the plan.  Rob Bunting, MD [facility] 09/06/2016, 9:30 AM

## 2017-01-12 ENCOUNTER — Emergency Department (HOSPITAL_COMMUNITY)
Admission: EM | Admit: 2017-01-12 | Discharge: 2017-01-12 | Disposition: A | Discharge status: Home / Self Care | Payer: No Typology Code available for payment source | Attending: Emergency Medicine | Admitting: Emergency Medicine

## 2017-01-12 ENCOUNTER — Encounter (HOSPITAL_COMMUNITY): Payer: Self-pay | Admitting: *Deleted

## 2017-01-12 DIAGNOSIS — E119 Type 2 diabetes mellitus without complications: Secondary | ICD-10-CM | POA: Insufficient documentation

## 2017-01-12 DIAGNOSIS — I10 Essential (primary) hypertension: Secondary | ICD-10-CM | POA: Insufficient documentation

## 2017-01-12 DIAGNOSIS — J449 Chronic obstructive pulmonary disease, unspecified: Secondary | ICD-10-CM | POA: Insufficient documentation

## 2017-01-12 DIAGNOSIS — S41152D Open bite of left upper arm, subsequent encounter: Secondary | ICD-10-CM | POA: Insufficient documentation

## 2017-01-12 DIAGNOSIS — F17210 Nicotine dependence, cigarettes, uncomplicated: Secondary | ICD-10-CM | POA: Insufficient documentation

## 2017-01-12 DIAGNOSIS — J45909 Unspecified asthma, uncomplicated: Secondary | ICD-10-CM | POA: Insufficient documentation

## 2017-01-12 DIAGNOSIS — Z7984 Long term (current) use of oral hypoglycemic drugs: Secondary | ICD-10-CM | POA: Insufficient documentation

## 2017-01-12 DIAGNOSIS — W5501XD Bitten by cat, subsequent encounter: Secondary | ICD-10-CM | POA: Insufficient documentation

## 2017-01-12 DIAGNOSIS — Z79899 Other long term (current) drug therapy: Secondary | ICD-10-CM | POA: Insufficient documentation

## 2017-01-12 MED ORDER — METRONIDAZOLE 500 MG PO TABS
500.0000 mg | ORAL_TABLET | Freq: Once | ORAL | Status: AC
  Administered 2017-01-12: 500 mg via ORAL
  Filled 2017-01-12: qty 1

## 2017-01-12 MED ORDER — METRONIDAZOLE 500 MG PO TABS: 500.0000 mg | ORAL_TABLET | Freq: Two times a day (BID) | ORAL | 0 refills | Status: DC

## 2017-01-12 NOTE — ED Triage Notes (Signed)
Pt states that she was bit by her cat on her left forearm on Sunday. [facility] for [facility] same, has been taking prescribed abx and abx cream. Pt states that the area is now more red and traveling up her arm. Pt denies fevers.

## 2017-01-12 NOTE — Discharge Instructions (Signed)
Continue taking doxycycline. Take metronidazole along with the doxycycline. Continue using warm compresses/warm soaks. Return if the redness is spreading in spite of the antibiotics.

## 2017-01-12 NOTE — ED Provider Notes (Signed)
[facility]-EM[facility]Y DEPT Provider Note   CSN: [id number] Arrival date & time: 01/12/17  0032     History   Chief Complaint Chief Complaint  Patient presents with  . Animal Bite    HPI Nancy Thornton is a 56 y.o. female.  The history is provided by the patient.  She suffered a cat bite to her left arm 4 days ago. She was seen in urgent care 3 days ago and started on doxycycline. She has also been using warm compresses. She is concerned because the area of redness in her arm seems to be spreading distal to where the bite was. She denies fever or chills.  Past Medical History:  Diagnosis Date  . Anal fissure    history of  . Asthma   . Depression   . Diabetes mellitus without complication (HCC)   . Hypertension   . Lichen    hands and feet  . Obesity   . 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 Neuropathy in diabetes (HCC) 04/18/2013  . HTN (hypertension) 10/19/2012  . DM (diabetes mellitus) (HCC) 10/19/2012  . HLD (hyperlipidemia) 10/19/2012    Past Surgical History:  Procedure Laterality Date  . BACK SURGERY    . CHOLECYSTECTOMY    . CYSTOSTOMY W/ BLADDER BIOPSY  1990   punctured hole in bladder, had open procedure to repair  . TONSILLECTOMY    . TOTAL VAGINAL HYSTERECTOMY     56 y/o. bleeding. ovaries left in situ    OB History    Gravida Para Term Preterm AB Living   2 2           SAB TAB Ectopic Multiple Live Births                  Obstetric Comments   SVD x 2       Home Medications    Prior to Admission medications   Medication Sig Start Date End Date Taking? Authorizing Provider  albuterol (PROAIR HFA) 108 (90 BASE) MCG/ACT inhaler Inhale 2 puffs into the lungs every 6 (six) hours as needed for wheezing or shortness of breath. 01/14/14   Jannifer Rodney A, FNP  atenolol (TENORMIN) 50 MG tablet Take 1 tablet (50 mg total) by mouth daily.  05/27/15   Frederica Kuster, MD  budesonide-formoterol [facility]) 160-4.5 MCG/ACT inhaler Inhale 2 puffs into the lungs 2 (two) times daily. 10/28/14   Daphine Deutscher, Mary-Margaret, FNP  lisinopril (PRINIVIL,ZESTRIL) 20 MG tablet Take 1 tablet (20 mg total) by mouth daily. 05/27/15   Frederica Kuster, MD  metFORMIN (GLUCOPHAGE) 500 MG tablet TAKE ONE TABLET BY MOUTH ONCE DAILY WITH  BREAKFAST 05/27/15   Frederica Kuster, MD  metroNIDAZOLE (FLAGYL) 500 MG tablet Take 1 tablet (500 mg total) by mouth 2 (two) times daily. 01/12/17   Dione Booze, MD  Omega-3 Fatty Acids (FISH OIL PO) Take by mouth.    [provider]    Family History Family History  Problem Relation Age of Onset  . Diabetes Mother   . Hypertension Father   . Colon cancer Neg Hx   . Esophageal cancer Neg Hx   . Rectal cancer Neg Hx   . Stomach cancer Neg Hx     Social History Social History  Substance Use Topics  . Smoking status: Current Every Day Smoker    Packs/day: 0.50  Types: Cigarettes    Start date: 10/18/1977  . Smokeless tobacco: Never Used  . Alcohol use No     Allergies   Penicillins; Meloxicam; and Bactrim [sulfamethoxazole-trimethoprim]   Review of Systems Review of Systems  All other systems reviewed and are negative.    Physical Exam Updated Vital Signs BP (!) 176/93 (BP Location: Left Arm)   Pulse 76   Temp 98.1 F (36.7 C) (Oral)   Resp 18   Ht 5\' 5"  (1.651 m)   Wt 103 kg (227 lb)   SpO2 97%   BMI 37.77 kg/m   Physical Exam  Nursing note and vitals reviewed.  56 year old female, resting comfortably and in no acute distress. Vital signs are significant for hypertension. Oxygen saturation is 97%, which is normal. Head is normocephalic and atraumatic. PERRLA, EOMI. Oropharynx is clear. Neck is nontender and supple without adenopathy or JVD. Back is nontender and there is no CVA tenderness. Lungs are clear without rales, wheezes, or rhonchi. Chest is nontender. Heart has  regular rate and rhythm without murmur. Abdomen is soft, flat, nontender without masses or hepatosplenomegaly and peristalsis is normoactive. Extremities: Bite marks on the left forearm just distal to the elbow are present. There is mild induration and tenderness. Mild area of erythema surrounding this the bite marks without lymphangitic streaks. There are no epitrochlear or axillary lymph nodes palpable.. Skin is warm and dry without other rash. Neurologic: Mental status is normal, cranial nerves are intact, there are no motor or sensory deficits.  ED Treatments / Results   Procedures Procedures (including critical care time)  Medications Ordered in ED Medications  metroNIDAZOLE (FLAGYL) tablet 500 mg (not administered)     Initial Impression / Assessment and Plan / ED Course  I have reviewed the triage vital signs and the nursing notes.  Pertinent labs & imaging results that were available during my care of the patient were reviewed by me and considered in my medical decision making (see chart for details).  Bite of left arm with possible early cellulitis in spite of antibiotics. She is penicillin allergic, so cannot be given Augmentin. There is no definite abscess present to warrant incision and drainage, but there is concern that the area of erythema does seem to be spreading. Metronidazole will be added to her antibiotic regimen and she is to continue conservative treatment with warm compresses. She is referred to hand surgery for follow-up. Return precautions discussed.  Final Clinical Impressions(s) / ED Diagnoses   Final diagnoses:  Cat bite, subsequent encounter    New Prescriptions New Prescriptions   METRONIDAZOLE (FLAGYL) 500 MG TABLET    Take 1 tablet (500 mg total) by mouth 2 (two) times daily.     Dione BoozeGlick, Erbie Arment, MD 01/12/17 0430

## 2017-08-15 DIAGNOSIS — S83282A Other tear of lateral meniscus, current injury, left knee, initial encounter: Secondary | ICD-10-CM

## 2017-08-15 HISTORY — DX: Other tear of lateral meniscus, current injury, left knee, initial encounter: S83.282A

## 2017-08-29 DIAGNOSIS — S83232A Complex tear of medial meniscus, current injury, left knee, initial encounter: Secondary | ICD-10-CM

## 2017-08-29 HISTORY — DX: Complex tear of medial meniscus, current injury, left knee, initial encounter: S83.232A

## 2017-09-26 DIAGNOSIS — Z9889 Other specified postprocedural states: Secondary | ICD-10-CM | POA: Insufficient documentation

## 2017-10-13 LAB — GLUCOSE, POCT (MANUAL RESULT ENTRY): POC Glucose: 150 mg/dl — AB (ref 70–99)

## 2017-12-22 ENCOUNTER — Ambulatory Visit: Payer: BLUE CROSS/BLUE SHIELD | Admitting: Internal Medicine

## 2017-12-22 ENCOUNTER — Encounter: Payer: Self-pay | Admitting: Internal Medicine

## 2017-12-22 VITALS — BP 154/78 | HR 63 | Ht 65.5 in | Wt 222.0 lb

## 2017-12-22 DIAGNOSIS — I10 Essential (primary) hypertension: Secondary | ICD-10-CM

## 2017-12-22 DIAGNOSIS — Z720 Tobacco use: Secondary | ICD-10-CM | POA: Diagnosis not present

## 2017-12-22 DIAGNOSIS — R079 Chest pain, unspecified: Secondary | ICD-10-CM | POA: Diagnosis not present

## 2017-12-22 DIAGNOSIS — E7800 Pure hypercholesterolemia, unspecified: Secondary | ICD-10-CM

## 2017-12-22 DIAGNOSIS — I700 Atherosclerosis of aorta: Secondary | ICD-10-CM | POA: Diagnosis not present

## 2017-12-22 MED ORDER — ATORVASTATIN CALCIUM 10 MG PO TABS: 10.0000 mg | ORAL_TABLET | Freq: Every day | ORAL | 3 refills | Status: DC

## 2017-12-22 MED ORDER — AMLODIPINE BESYLATE 2.5 MG PO TABS: 2.5000 mg | ORAL_TABLET | Freq: Every day | ORAL | 3 refills | Status: DC

## 2017-12-22 NOTE — Patient Instructions (Addendum)
Your physician recommends that you schedule a follow-up appointment in: 9 weeks    START Amlodipine 2.5 mg daily for blood pressure   START Atorvastatin  10 mg daily at dinner   Get FASTING lab work in 8 weeks     No testing ordered today      Thank you for choosing [facility] !

## 2017-12-22 NOTE — Progress Notes (Signed)
Cardiology Office Note   Date:  12/22/2017   ID:  Nancy LeashDrusilla Kendricks, DOB 06-12-1961, MRN [id number]  PCP:  Joette CatchingNyland, Leonard, MD  Cardiologist:   Dietrich PatesPaula Khristi Schiller, MD   Referred by Rushie GoltzK Hess for CP   History of Present Illness: Nancy Thornton is a 57 y.o. female with a history of CP   She ws seen by Rushie GoltzK Hess   Last seen June 17    CP began about 2 to 3 days prior.  On first day pain lasted about 20 min  Pain worse with deep breathg  Pt had to sit down  Was preparing dinner at the time   Had similar episdoe in Jan    Pt had brief mild spell last night while lying on sofa  It went away quickly   Was pleuritic.     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 albuterol (PROAIR HFA) 108 (90 BASE) MCG/ACT inhaler Inhale 2 puffs into the lungs every 6 (six) hours as needed for wheezing or shortness of breath.  Marland Kitchen. atenolol (TENORMIN) 50 MG tablet Take 1 tablet (50 mg total) by mouth daily.  . budesonide-formoterol (SYMBICORT) 160-4.5 MCG/ACT inhaler Inhale 2 puffs into the lungs 2 (two) times daily.  Marland Kitchen. gabapentin (NEURONTIN) 300 MG capsule Take 300 mg by mouth at bedtime.   Marland Kitchen. lisinopril (PRINIVIL,ZESTRIL) 20 MG tablet Take 1 tablet (20 mg total) by mouth daily.  . Melatonin 1 MG TABS Take by mouth.  . metFORMIN (GLUCOPHAGE) 500 MG tablet TAKE ONE TABLET BY MOUTH ONCE DAILY WITH  BREAKFAST  . Omega-3 Fatty Acids (FISH OIL PO) Take by mouth.  . Vitamin D, Ergocalciferol, (DRISDOL) 50000 units CAPS capsule Take 50,000 Units by mouth once a week.     Allergies:   Penicillins; Meloxicam; and Bactrim [sulfamethoxazole-trimethoprim]   Past Medical History:  Diagnosis  Date  . Anal fissure    history of  . Asthma   . Depression   . Diabetes mellitus without complication (HCC)   . Hypertension   . Lichen    hands and feet  . Obesity   . 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 BACK SURGERY    . CHOLECYSTECTOMY    . CYSTOSTOMY W/ BLADDER BIOPSY  1990   punctured hole in bladder, had open procedure to repair  . TONSILLECTOMY    . TOTAL VAGINAL HYSTERECTOMY     57 y/o. bleeding. ovaries left in situ     Social History:  The patient  reports that she has been smoking cigarettes.  She started smoking about 40 years ago. She has been smoking about 0.50 packs per day. She has never used smokeless tobacco. She reports that she does not drink alcohol or use drugs.   Family History:  The patient's family history includes Diabetes in her mother; Hypertension in her father.  ROS:  Please see the history of present illness. All other systems are reviewed and  Negative to the above problem except as noted.    PHYSICAL EXAM: VS:  BP (!) 154/78 (BP Location: Right Arm)   Pulse 63   Ht 5' 5.5" (1.664 m)   Wt 100.7 kg (222 lb)   SpO2 95%   BMI 36.38 kg/m   GEN:  Morbidly obese 57 yo  in no acute distress  HEENT: normal  Neck: no JVD, carotid bruits, or masses Cardiac: RRR; no murmurs, rubs, or gallops,no edema  CHest  Tender   Respiratory:  Mild wheezing bilaterally  GI: soft, nontender, nondistended, + BS  No hepatomegaly  MS: no deformity Moving all extremities   Skin: warm and dry, no rash Neuro:  Strength and sensation are intact Psych: euthymic mood, full affect   EKG:  EKG is ordered today.  SR 60      Lipid Panel    Component Value Date/Time   CHOL 216 (H) 11/21/2014 1619   CHOL 196 01/17/2013 1226   TRIG 183 (H) 11/21/2014 1619   TRIG 233 (H) 09/05/2013 1142   TRIG 176 (H) 01/17/2013 1226   HDL 69 11/21/2014 1619   HDL 58 09/05/2013 1142   HDL  57 01/17/2013 1226   CHOLHDL 3.1 11/21/2014 1619   LDLCALC 110 (H) 11/21/2014 1619   LDLCALC 58 09/05/2013 1142   LDLCALC 104 (H) 01/17/2013 1226      Wt Readings from Last 3 Encounters:  12/22/17 100.7 kg (222 lb)  01/12/17 103 kg (227 lb)  09/06/16 105.7 kg (233 lb)      ASSESSMENT AND PLAN:  1  Chest pain   Atypical  Pleuritic   Pt's chest tender   Alsowheezes on exam   Could be msucular or pleuritic   I do not think cardiac   2  Atherosclerosis  Pt with plaquing    LDL in April was 68   Wilth plaquing should be on statin   Will get on lipitor 10   F/U lipomed panel in 8 wks  3  HTN  BP is not optimal  Runs high  Will add amlodipine to regimen   2.5 mg   F/U in 6 wks  4  Tob abuse  Counselled on cessation    She is wheezing on exam  5  DM   Watch carbs   Stay actvie  Get wt down     F/U in 6 wks     Current medicines are reviewed at length with the patient today.  The patient does not have concerns regarding medicines.  Signed, Dietrich Pates, MD  12/22/2017 11:16 AM    [facility] [address] [place], [place], [place]  [address] Phone: [phone]; Fax: [phone]

## 2018-02-16 ENCOUNTER — Other Ambulatory Visit (HOSPITAL_COMMUNITY)
Admission: RE | Admit: 2018-02-16 | Discharge: 2018-02-16 | Disposition: A | Discharge status: Home / Self Care | Payer: BLUE CROSS/BLUE SHIELD | Source: Ambulatory Visit | Attending: Internal Medicine | Admitting: Internal Medicine

## 2018-02-16 DIAGNOSIS — E7800 Pure hypercholesterolemia, unspecified: Secondary | ICD-10-CM | POA: Insufficient documentation

## 2018-02-17 LAB — LDL CHOLESTEROL, DIRECT: Direct LDL: 54 mg/dL (ref 0–99)

## 2018-02-20 DIAGNOSIS — F17200 Nicotine dependence, unspecified, uncomplicated: Secondary | ICD-10-CM | POA: Insufficient documentation

## 2018-02-20 DIAGNOSIS — K1321 Leukoplakia of oral mucosa, including tongue: Secondary | ICD-10-CM | POA: Insufficient documentation

## 2018-02-23 ENCOUNTER — Encounter: Payer: Self-pay | Admitting: Student

## 2018-02-23 ENCOUNTER — Ambulatory Visit: Payer: BLUE CROSS/BLUE SHIELD | Admitting: Student

## 2018-02-23 VITALS — BP 104/68 | HR 65 | Ht 65.0 in | Wt 218.0 lb

## 2018-02-23 DIAGNOSIS — I700 Atherosclerosis of aorta: Secondary | ICD-10-CM | POA: Diagnosis not present

## 2018-02-23 DIAGNOSIS — I10 Essential (primary) hypertension: Secondary | ICD-10-CM

## 2018-02-23 DIAGNOSIS — E785 Hyperlipidemia, unspecified: Secondary | ICD-10-CM

## 2018-02-23 DIAGNOSIS — R079 Chest pain, unspecified: Secondary | ICD-10-CM

## 2018-02-23 DIAGNOSIS — Z720 Tobacco use: Secondary | ICD-10-CM

## 2018-02-23 NOTE — Patient Instructions (Addendum)
Your physician wants you to follow-up in:1 year with Dr.Ross  with  You will receive a reminder letter in the mail two months in advance. If you don't receive a letter, please call our office to schedule the follow-up appointment.      Your physician recommends that you continue on your current medications as directed. Please refer to the Current Medication list given to you today.     If you need a refill on your cardiac medications before your next appointment, please call your pharmacy.     No labs or tests today       Thank you for choosing [facility] !

## 2018-02-23 NOTE — Progress Notes (Signed)
Cardiology Office Note    Date:  02/23/2018   ID:  Nancy Thornton, DOB 1961/05/25, MRN [id number]  PCP:  Joette Catching, MD  Cardiologist: Dietrich Pates, MD    Chief Complaint  Patient presents with  . Follow-up    2 month visit    History of Present Illness:    Nancy Thornton is a 57 y.o. female with past medical history of HTN, HLD, and Type II DM who presents to the office today for 60-month follow-up.  She was last examined by Dr. Tenny Craw on 12/22/2017 as a new patient referral for episodes of chest pain. She reported having chest pain 2 to 3 days prior to being evaluated by her PCP and the pain would typically last for 20 to 30 minutes and was worse with deep breathing. She reported being very active at baseline and denied any recent symptoms with this. At the time of her visit, her chest pain was overall thought to be pleuritic in etiology and no further cardiac testing was pursued at that time. Due to having atherosclerotic plaque along the aorta by prior CT imaging, she was started on Lipitor 10 mg daily. BP was also elevated at 154/78 during her visit, therefore Amlodipine 2.5 mg daily was added to her regimen.  In talking with the patient today, she reports overall doing well since her last office visit. She denies any repeat episodes of chest pain. 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 No recent dyspnea on exertion, orthopnea, PND, or lower extremity edema. She does report having chronic sciatic pain which is unchanged.  She has not followed her blood pressure at home but it is well-controlled at 104/68 during today's visit. She was started on Atorvastatin at the time of her last office visit but states she does not wish to remain on the medication due to possible side effects. She denies any noted side effects at this time.  She was previously smoking a pack per day but has reduced this to 0.5 packs/day and intends to quit within the  next few weeks.   Past Medical History:  Diagnosis Date  . Anal fissure    history of  . Asthma   . Depression   . Diabetes mellitus without complication (HCC)   . Hypertension   . Lichen    hands and feet  . Obesity   . 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 BACK SURGERY    . CHOLECYSTECTOMY    . CYSTOSTOMY W/ BLADDER BIOPSY  1990   punctured hole in bladder, had open procedure to repair  . TONSILLECTOMY    . TOTAL VAGINAL HYSTERECTOMY     57 y/o. bleeding. ovaries left in situ    Current Medications: Outpatient Medications Prior to Visit  Medication Sig Dispense Refill  . albuterol (PROAIR HFA) 108 (90 BASE) MCG/ACT inhaler Inhale 2 puffs into the lungs every 6 (six) hours as needed for wheezing or shortness of breath. 3.7 g 6  . amLODipine (NORVASC) 2.5 MG tablet Take 1 tablet (2.5 mg total) by mouth daily. 90 tablet 3  . atenolol (TENORMIN) 50 MG tablet Take 1 tablet (50 mg total) by mouth daily. 90 tablet 1  . atorvastatin (LIPITOR) 10 MG tablet Take 1 tablet (10 mg total) by mouth daily. 90 tablet 3  . budesonide-formoterol (SYMBICORT) 160-4.5 MCG/ACT inhaler  Inhale 2 puffs into the lungs 2 (two) times daily. 1 Inhaler 3  . gabapentin (NEURONTIN) 300 MG capsule Take 300 mg by mouth at bedtime.   5  . lisinopril (PRINIVIL,ZESTRIL) 20 MG tablet Take 1 tablet (20 mg total) by mouth daily. 90 tablet 1  . Melatonin 1 MG TABS Take by mouth.    . metFORMIN (GLUCOPHAGE) 500 MG tablet TAKE ONE TABLET BY MOUTH ONCE DAILY WITH  BREAKFAST 90 tablet 1  . Vitamin D, Ergocalciferol, (DRISDOL) 50000 units CAPS capsule Take 50,000 Units by mouth once a week.  5  . Omega-3 Fatty Acids (FISH OIL PO) Take by mouth.     No facility-administered medications prior to visit.      Allergies:   Penicillins; Meloxicam; and Bactrim [sulfamethoxazole-trimethoprim]   Social History   Socioeconomic History  .  Marital status: Divorced    Spouse name: Not on file  . Number of children: 1  . Years of education: Not on file  . Highest education level: Not on file  Occupational History  . 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 Financial resource strain: Not on file  . Food insecurity:    Worry: Not on file    Inability: Not on file  . Transportation needs:    Medical: Not on file    Non-medical: Not on file  Tobacco Use  . Smoking status: Current Every Day Smoker    Packs/day: 0.50    Types: Cigarettes    Start date: 10/18/1977  . Smokeless tobacco: Never Used  Substance and Sexual Activity  . Alcohol use: No  . Drug use: No  . Sexual activity: Not on file  Lifestyle  . Physical activity:    Days per week: Not on file    Minutes per session: Not on file  . Stress: Not on file  Relationships  . Social connections:    Talks on phone: Not on file    Gets together: Not on file    Attends religious service: Not on file    Active member of club or organization: Not on file    Attends meetings of clubs or organizations: Not on file    Relationship status: Not on file  Other Topics Concern  . Not on file  Social History Narrative  . Not on file     Family History:  The patient's family history includes Diabetes in her mother; Hypertension in her father.   Review of Systems:   Please see the history of present illness.     General:  No chills, fever, night sweats or weight changes.  Cardiovascular:  No chest pain, dyspnea on exertion, edema, orthopnea, palpitations, paroxysmal nocturnal dyspnea. Dermatological: No rash, lesions/masses Respiratory: No cough, dyspnea Urologic: No hematuria, dysuria Abdominal:   No nausea, vomiting, diarrhea, bright red blood per rectum, melena, or hematemesis Neurologic:  No visual changes, wkns, changes in mental status. Positive for sciatic pain.   All other systems reviewed and are otherwise negative except as noted  above.   Physical Exam:    VS:  BP 104/68   Pulse 65   Ht 5\' 5"  (1.651 m)   Wt 218 lb (98.9 kg)   SpO2 96%   BMI 36.28 kg/m    General: Well developed, well nourished Caucasian female appearing in no acute distress. Head: Normocephalic, atraumatic, sclera non-icteric, no xanthomas, nares are without discharge.  Neck: No carotid bruits. JVD not elevated.  Lungs: Respirations regular  and unlabored, without wheezes or rales.  Heart: Regular rate and rhythm. No S3 or S4.  No murmur, no rubs, or gallops appreciated. Abdomen: Soft, non-tender, non-distended with normoactive bowel sounds. No hepatomegaly. No rebound/guarding. No obvious abdominal masses. Msk:  Strength and tone appear normal for age. No joint deformities or effusions. Extremities: No clubbing or cyanosis. No lower extremity edema.  Distal pedal pulses are 2+ bilaterally. Neuro: Alert and oriented X 3. Moves all extremities spontaneously. No focal deficits noted. Psych:  Responds to questions appropriately with a normal affect. Skin: No rashes or lesions noted  Wt Readings from Last 3 Encounters:  02/23/18 218 lb (98.9 kg)  12/22/17 222 lb (100.7 kg)  01/12/17 227 lb (103 kg)     Studies/Labs Reviewed:   EKG:  EKG is not ordered today.   Recent Labs: No results found for requested labs within last 8760 hours.   Lipid Panel    Component Value Date/Time   CHOL 216 (H) 11/21/2014 1619   CHOL 196 01/17/2013 1226   TRIG 183 (H) 11/21/2014 1619   TRIG 233 (H) 09/05/2013 1142   TRIG 176 (H) 01/17/2013 1226   HDL 69 11/21/2014 1619   HDL 58 09/05/2013 1142   HDL 57 01/17/2013 1226   CHOLHDL 3.1 11/21/2014 1619   LDLCALC 110 (H) 11/21/2014 1619   LDLCALC 58 09/05/2013 1142   LDLCALC 104 (H) 01/17/2013 1226   LDLDIRECT 54 02/16/2018 0903    Additional studies/ records that were reviewed today include:   EKG: 12/22/2017: Normal sinus rhythm, heart rate 60, with no diagnostic ST changes.  Assessment:    1.  Essential hypertension   2. Chest pain, unspecified type   3. Atherosclerosis of aorta (HCC)   4. Hyperlipidemia LDL goal <70   5. Tobacco abuse      Plan:   In order of problems listed above:  1. HTN - BP is well controlled at 104/68 during today's visit. She has not checked this in the ambulatory setting but reports overall tolerating her medications well and denies any noted side effects. - Will continue on Amlodipine 2.5 mg daily, Atenolol 50 mg daily, and Lisinopril 20 mg daily. I have asked her to follow blood pressure at home due to her soft readings. If this becomes a trend or she develops symptoms, may need to discontinue Amlodipine.   2. Chest Pain - Had experienced a 20-minute episode of chest pain at the time of her last office visit which was worse with deep breathing and thought to be pleuritic in etiology. Was tender to palpation on examination in 12/2017. She denies any repeat episodes of chest pain since. She is very active at baseline with her job and has not experienced any anginal symptoms. - No indication for further testing at this time. Continue with risk factor management.  3. Aortic Atherosclerosis - Noted on prior CT imaging in 05/2016. She is currently on statin therapy. See discussion below.   4. HLD - FLP in 10/2017 showed total cholesterol 162, HDL 49, and LDL 77. She was started on Atorvastatin 10 mg daily at the time of her last office visit and repeat labs on 02/16/2018 showed her LDL had improved to 54. She is at goal of LDL less than 70 in the setting of known atherosclerotic plaque. However, she wishes to stop statin therapy due to possible hepatic side effects. We reviewed that her liver function was within normal limits on her most recent check on 01/02/2018. She plans  to continue on Atorvastatin at this time but I am unsure of how long she will be compliant with this.  5. Tobacco Use - was previously smoking a pack per day but has reduced this to 0.5  packs/day and intends to quit within the next few weeks. Congratulated on her reduction with cessation advised.   Medication Adjustments/Labs and Tests Ordered: Current medicines are reviewed at length with the patient today.  Concerns regarding medicines are outlined above.  Medication changes, Labs and Tests ordered today are listed in the Patient Instructions below. Patient Instructions  Your physician wants you to follow-up in:1 year with Dr.Ross  with  You will receive a reminder letter in the mail two months in advance. If you don't receive a letter, please call our office to schedule the follow-up appointment.  Your physician recommends that you continue on your current medications as directed. Please refer to the Current Medication list given to you today.  If you need a refill on your cardiac medications before your next appointment, please call your pharmacy.  No labs or tests today  Thank you for choosing [facility] !        Signed, Ellsworth Lennox, PA-C  02/23/2018 1:40 PM    [facility] [address] [place], [place] [address] Phone: [phone]

## 2018-02-27 ENCOUNTER — Telehealth: Payer: Self-pay | Admitting: Internal Medicine

## 2018-02-27 NOTE — Telephone Encounter (Signed)
Spoke with patient. See lab results for more detail.

## 2018-02-27 NOTE — Telephone Encounter (Signed)
New Message ° ° °Patient is returning call in reference to labs. Please call to discuss.  °

## 2018-05-18 LAB — GLUCOSE, POCT (MANUAL RESULT ENTRY): POC Glucose: 172 mg/dl — AB (ref 70–99)

## 2018-07-10 ENCOUNTER — Encounter: Payer: Self-pay | Admitting: Family Medicine

## 2018-07-10 ENCOUNTER — Ambulatory Visit (INDEPENDENT_AMBULATORY_CARE_PROVIDER_SITE_OTHER): Payer: PRIVATE HEALTH INSURANCE | Admitting: Family Medicine

## 2018-07-10 VITALS — BP 112/72 | HR 55 | Temp 98.0°F | Ht 65.0 in | Wt 215.0 lb

## 2018-07-10 DIAGNOSIS — Z7689 Persons encountering health services in other specified circumstances: Secondary | ICD-10-CM

## 2018-07-10 DIAGNOSIS — E1165 Type 2 diabetes mellitus with hyperglycemia: Secondary | ICD-10-CM

## 2018-07-10 DIAGNOSIS — E669 Obesity, unspecified: Secondary | ICD-10-CM | POA: Insufficient documentation

## 2018-07-10 DIAGNOSIS — I10 Essential (primary) hypertension: Secondary | ICD-10-CM

## 2018-07-10 DIAGNOSIS — I152 Hypertension secondary to endocrine disorders: Secondary | ICD-10-CM

## 2018-07-10 DIAGNOSIS — E1169 Type 2 diabetes mellitus with other specified complication: Secondary | ICD-10-CM | POA: Diagnosis not present

## 2018-07-10 DIAGNOSIS — E1159 Type 2 diabetes mellitus with other circulatory complications: Secondary | ICD-10-CM

## 2018-07-10 DIAGNOSIS — E785 Hyperlipidemia, unspecified: Secondary | ICD-10-CM

## 2018-07-10 DIAGNOSIS — Z720 Tobacco use: Secondary | ICD-10-CM | POA: Insufficient documentation

## 2018-07-10 LAB — BAYER DCA HB A1C WAIVED: HB A1C (BAYER DCA - WAIVED): 6.6 % (ref <7.0)

## 2018-07-10 MED ORDER — ATENOLOL 50 MG PO TABS: 50.0000 mg | ORAL_TABLET | Freq: Every day | ORAL | 12 refills | Status: DC

## 2018-07-10 MED ORDER — GABAPENTIN 300 MG PO CAPS: 300.0000 mg | ORAL_CAPSULE | Freq: Every day | ORAL | 12 refills | Status: DC

## 2018-07-10 MED ORDER — METFORMIN HCL 500 MG PO TABS: 500.0000 mg | ORAL_TABLET | Freq: Two times a day (BID) | ORAL | 2 refills | Status: DC

## 2018-07-10 MED ORDER — LISINOPRIL-HYDROCHLOROTHIAZIDE 20-25 MG PO TABS: 1.0000 | ORAL_TABLET | Freq: Every day | ORAL | 12 refills | Status: DC

## 2018-07-10 NOTE — Progress Notes (Signed)
Subjective: ZO:XWRUEAVWUCC:establish care, DM2, HTN, HLD HPI: Nancy LeashDrusilla Thornton is a 58 y.o. female presenting to clinic today for:  1. Type 2 Diabetes w/ HTN and HLD. Patient reports she does not monitor blood sugars closely at home.  Taking medication(s): Metformin 500 mg p.o. twice daily, Side effects: None at this time.  Last eye exam: March 2019. Last foot exam: Unknown Last A1c: 7.3 in October 2019 at previous PCP Nephropathy screen indicated?:  On ACE inhibitor Last flu, zoster and/or pneumovax: UTD.  ROS: denies dizziness, LOC, polyuria, polydipsia, unintended weight loss/gain, foot ulcerations, numbness or tingling in extremities or chest pain.  2.  Tobacco use disorder/COPD Patient reports noncompliance with Symbicort and rare use of albuterol.  She is currently working on smoking cessation with the 1800 quit now.  She is on nicotine patches with a quit date planned for the 10th.  Denies any shortness of breath, wheeze, hemoptysis.   Past Medical History:  Diagnosis Date  . Anal fissure    history of  . Asthma   . Depression   . Diabetes mellitus without complication (HCC)   . DM (diabetes mellitus) (HCC) 10/19/2012  . Hypertension   . Lichen    hands and feet  . Obesity   . 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 BACK SURGERY    . CHOLECYSTECTOMY    . CYSTOSTOMY W/ BLADDER BIOPSY  1990   punctured hole in bladder, had open procedure to repair  . TONSILLECTOMY    . TOTAL VAGINAL HYSTERECTOMY     58 y/o. bleeding. ovaries left in situ   Social History   Socioeconomic History  . Marital status: Divorced    Spouse name: Not on file  . Number of children: 1  . Years of education: Not on file  . Highest education level: Not on file  Occupational History  . 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 Financial resource strain: Not on file  . Food insecurity:   Worry: Not on file    Inability: Not on file  . Transportation needs:    Medical: Not on file    Non-medical: Not on file  Tobacco Use  . Smoking status: Current Every Day Smoker    Packs/day: 0.50    Types: Cigarettes    Start date: 10/18/1977  . Smokeless tobacco: Never Used  Substance and Sexual Activity  . Alcohol use: No  . Drug use: No  . Sexual activity: Not on file  Lifestyle  . Physical activity:    Days per week: Not on file    Minutes per session: Not on file  . Stress: Not on file  Relationships  . Social connections:    Talks on phone: Not on file    Gets together: Not on file    Attends religious service: Not on file    Active member of club or organization: Not on file    Attends meetings of clubs or organizations: Not on file    Relationship status: Not on file  . Intimate partner violence:    Fear of current or ex partner: Not on file    Emotionally abused: Not on file    Physically abused: Not on file    Forced sexual activity: Not on file  Other Topics Concern  . Not on file  Social History Narrative  . Not on  file   Current Meds  Medication Sig  . albuterol (PROAIR HFA) 108 (90 BASE) MCG/ACT inhaler Inhale 2 puffs into the lungs every 6 (six) hours as needed for wheezing or shortness of breath.  Marland Kitchen amLODipine (NORVASC) 2.5 MG tablet Take 1 tablet (2.5 mg total) by mouth daily.  Marland Kitchen atenolol (TENORMIN) 50 MG tablet Take 1 tablet (50 mg total) by mouth daily.  Marland Kitchen atorvastatin (LIPITOR) 10 MG tablet Take 1 tablet (10 mg total) by mouth daily.  Marland Kitchen gabapentin (NEURONTIN) 300 MG capsule Take 1 capsule (300 mg total) by mouth at bedtime.  Marland Kitchen lisinopril-hydrochlorothiazide (PRINZIDE,ZESTORETIC) 20-25 MG tablet Take 1 tablet by mouth daily.  . Melatonin 1 MG TABS Take by mouth.  . metFORMIN (GLUCOPHAGE) 500 MG tablet Take 1 tablet (500 mg total) by mouth 2 (two) times daily with a meal.  . Vitamin D, Ergocalciferol, (DRISDOL) 50000 units CAPS capsule Take 50,000  Units by mouth once a week.  . [DISCONTINUED] atenolol (TENORMIN) 50 MG tablet Take 1 tablet (50 mg total) by mouth daily.  . [DISCONTINUED] gabapentin (NEURONTIN) 300 MG capsule Take 300 mg by mouth at bedtime.   . [DISCONTINUED] lisinopril-hydrochlorothiazide (PRINZIDE,ZESTORETIC) 20-25 MG tablet TAKE 1 TABLET BY MOUTH ONCE DAILY  . [DISCONTINUED] metFORMIN (GLUCOPHAGE) 500 MG tablet TAKE ONE TABLET BY MOUTH ONCE DAILY WITH  BREAKFAST   Family History  Problem Relation Age of Onset  . Diabetes Mother   . Hypertension Father   . Colon cancer Neg Hx   . Esophageal cancer Neg Hx   . Rectal cancer Neg Hx   . Stomach cancer Neg Hx    Allergies  Allergen Reactions  . Penicillins Hives  . Meloxicam Hypertension  . Bactrim [Sulfamethoxazole-Trimethoprim] Rash     Health Maintenance: ?HIV screen and pap smear? ROS: Per HPI  Objective: Office vital signs reviewed. BP 112/72   Pulse (!) 55   Temp 98 F (36.7 C) (Oral)   Ht 5\' 5"  (1.651 m)   Wt 215 lb (97.5 kg)   BMI 35.78 kg/m   Physical Examination:  General: Awake, alert, well nourished, No acute distress HEENT: Normal, sclera white, MMM Cardio: bradycardic w/ regular rhythm, S1S2 heard, no murmurs appreciated Pulm: clear to auscultation bilaterally, mild expiratory wheezes noted.  Good air movement.  No rhonchi or rales; normal work of breathing on room air MSK: normal gait and station; she does have some pain and stiffness in her low back when getting up from a seated position. Psych: Mood stable, speech normal.  Depression screen [facility] 2/9 07/10/2018 08/15/2016 05/27/2015  Decreased Interest 0 0 0  Down, Depressed, Hopeless 0 0 0  PHQ - 2 Score 0 0 0  Altered sleeping 0 1 -  Tired, decreased energy 0 0 -  Change in appetite 0 0 -  Feeling bad or failure about yourself  0 0 -  Trouble concentrating 0 0 -  Moving slowly or fidgety/restless 0 0 -  Suicidal thoughts 0 0 -  PHQ-9 Score 0 1 -  Difficult doing work/chores Not  difficult at all - -   Results for orders placed or performed in visit on 07/10/18 (from the past 24 hour(s))  Bayer DCA Hb A1c Waived     Status: None   Collection Time: 07/10/18  2:45 PM  Result Value Ref Range   HB A1C (BAYER DCA - WAIVED) 6.6 <7.0 %   Narrative   Performed at:  [facility] [address] [place], [place]  [id number] Lab Director: Rockie Neighbours BSMT, Phone:  [phone]   Assessment/ Plan: 58 y.o. female   1. Type 2 diabetes mellitus with hyperglycemia, without long-term current use of insulin (HCC) Controlled.  A1c is at goal at 6.6.  This is improved from check with PCP previously.  Continue metformin p.o. twice daily.  Will need to obtain foot exam, diabetic eye exam results.  She may follow-up in the next 3 to 6 months. - Bayer DCA Hb A1c Waived - metFORMIN (GLUCOPHAGE) 500 MG tablet; Take 1 tablet (500 mg total) by mouth 2 (two) times daily with a meal.  Dispense: 60 tablet; Refill: 2  2. Establishing care with new doctor, encounter for Chart reviewed and care everywhere.  She has history of vaginal hysterectomy with sparing of her ovaries.  Unclear as to whether or not the cervical cuff is still present.  She is not had a Pap smear many years.  We will obtain her previous records to determine if this is needed.  HIV screen also needed at some point.  I cannot find this in her previous records.  She is up-to-date on other preventative care measures.  3. Hypertension associated with diabetes (HCC) Controlled.  Continue current regimen.  She has refills on Norvasc, which was prescribed by cardiologist. - Basic Metabolic Panel - atenolol (TENORMIN) 50 MG tablet; Take 1 tablet (50 mg total) by mouth daily.  Dispense: 30 tablet; Refill: 12 - lisinopril-hydrochlorothiazide (PRINZIDE,ZESTORETIC) 20-25 MG tablet; Take 1 tablet by mouth daily.  Dispense: 30 tablet; Refill: 12  4. Hyperlipidemia associated with type 2 diabetes mellitus (HCC) Continue  Lipitor.  5. Morbid obesity (HCC) - TSH  Shaunte Tuft Hulen Skains, DO [facility] [phone]

## 2018-07-11 LAB — BASIC METABOLIC PANEL
BUN/Creatinine Ratio: 26 — ABNORMAL HIGH (ref 9–23)
BUN: 19 mg/dL (ref 6–24)
CO2: 27 mmol/L (ref 20–29)
Calcium: 10.3 mg/dL — ABNORMAL HIGH (ref 8.7–10.2)
Chloride: 98 mmol/L (ref 96–106)
Creatinine, Ser: 0.74 mg/dL (ref 0.57–1.00)
GFR calc Af Amer: 104 mL/min/{1.73_m2} (ref >59)
GFR calc non Af Amer: 90 mL/min/{1.73_m2} (ref >59)
Glucose: 109 mg/dL — ABNORMAL HIGH (ref 65–99)
Potassium: 4.2 mmol/L (ref 3.5–5.2)
Sodium: 142 mmol/L (ref 134–144)

## 2018-07-11 LAB — TSH: TSH: 2.63 u[IU]/mL (ref 0.450–4.500)

## 2018-07-24 ENCOUNTER — Other Ambulatory Visit: Payer: Self-pay | Admitting: Family Medicine

## 2018-07-24 ENCOUNTER — Other Ambulatory Visit: Payer: Self-pay | Admitting: *Deleted

## 2018-07-24 DIAGNOSIS — E559 Vitamin D deficiency, unspecified: Secondary | ICD-10-CM

## 2018-07-24 NOTE — Telephone Encounter (Signed)
We should check vitamin D level if she is worried about it being low.  This is not an appropriate medication for refills without this information.  Last check was 2015.  I am happy to place lab order and she can come in at her convenience for this.  She does not have to be fasting.

## 2018-07-25 NOTE — Telephone Encounter (Signed)
She has an active vitamin d lab ordered.  She may come in at her convenience and I will send rx if appropriate when lab results.

## 2018-07-25 NOTE — Telephone Encounter (Signed)
Patient would like to draw blood and check vitamin D level for another script to be done.

## 2018-08-29 ENCOUNTER — Encounter: Payer: Self-pay | Admitting: Family Medicine

## 2018-08-29 ENCOUNTER — Telehealth: Payer: Self-pay | Admitting: Family Medicine

## 2018-08-29 ENCOUNTER — Ambulatory Visit (INDEPENDENT_AMBULATORY_CARE_PROVIDER_SITE_OTHER): Payer: PRIVATE HEALTH INSURANCE | Admitting: Family Medicine

## 2018-08-29 VITALS — BP 109/73 | HR 50 | Temp 98.2°F | Ht 65.0 in | Wt 215.0 lb

## 2018-08-29 DIAGNOSIS — J4530 Mild persistent asthma, uncomplicated: Secondary | ICD-10-CM | POA: Diagnosis not present

## 2018-08-29 DIAGNOSIS — M5442 Lumbago with sciatica, left side: Secondary | ICD-10-CM | POA: Diagnosis not present

## 2018-08-29 DIAGNOSIS — Z9889 Other specified postprocedural states: Secondary | ICD-10-CM | POA: Diagnosis not present

## 2018-08-29 DIAGNOSIS — G8929 Other chronic pain: Secondary | ICD-10-CM

## 2018-08-29 MED ORDER — BUDESONIDE-FORMOTEROL FUMARATE 160-4.5 MCG/ACT IN AERO: 2.0000 | INHALATION_SPRAY | Freq: Two times a day (BID) | RESPIRATORY_TRACT | 3 refills | Status: DC

## 2018-08-29 MED ORDER — GABAPENTIN 300 MG PO CAPS: 300.0000 mg | ORAL_CAPSULE | Freq: Two times a day (BID) | ORAL | 12 refills | Status: DC

## 2018-08-29 MED ORDER — ALBUTEROL SULFATE HFA 108 (90 BASE) MCG/ACT IN AERS: 2.0000 | INHALATION_SPRAY | Freq: Four times a day (QID) | RESPIRATORY_TRACT | 6 refills | Status: DC | PRN

## 2018-08-29 NOTE — Patient Instructions (Signed)
Inhalers refilled.  Gabapentin increased to 2 times daily.  Ok to continue Motrin and Tylenol.   Herniated Disk A herniated disk is when a disk in your spine bulges out too far. There is a disk with a spongy center in between each pair of bones in the spine (vertebrae). These disks act as shock absorbers when you move. A herniated disk can cause pain and muscle weakness. This can happen anywhere in the back or neck. Follow these instructions at home: Medicines  Take over-the-counter and prescription medicines only as told by your doctor.  Do not drive or use heavy machinery while taking prescription pain medicine. Activity  Rest as told by your doctor.  After your rest period: ? Return to your normal activities. Slowly start exercising as told by your doctor. Ask what activities are safe for you. ? Use good posture. ? Avoid movements that cause pain. ? Do not lift anything that is heavier than 10 lb (4.5 kg) until your doctor says this is safe. ? Do not sit or stand for a long time without moving. ? Do not sit for a long time without getting up and moving around.  Do exercises (physical therapy) as told.  Try to strengthen your back and belly (abdomen) with exercises like crunches, swimming, or walking. General instructions  Do not use any products that contain nicotine or tobacco, such as cigarettes and e-cigarettes. If you need help quitting, ask your doctor.  Do not wear high-heeled shoes.  Do not sleep on your belly.  If you are overweight, work with your doctor to lose weight safely.  To prevent or treat constipation while you are taking prescription pain medicine, your doctor may recommend that you: ? Drink enough fluid to keep your pee (urine) clear or pale yellow. ? Take over-the-counter or prescription medicines. ? Eat foods that are high in fiber. These include fresh fruits and vegetables, whole grains, and beans. ? Limit foods that are high in fat and processed  sugars. These include fried and sweet foods.  Keep all follow-up visits as told by your doctor. This is important. How is this prevented?   Stay at a healthy weight.  Try to avoid stress.  Stay in shape. Do at least 150 minutes of moderate-intensity exercise each week, such as fast walking or water aerobics.  When lifting objects: ? Keep your feet as far apart as your shoulders (shoulder-width apart) or farther apart. ? Tighten your belly muscles. ? Bend your knees and hips and keep your spine neutral. Lift using the strength of your legs, not your back. Do not lock your knees straight out. ? Always ask for help to lift heavy or awkward objects. Contact a doctor if:  You have back pain or neck pain that does not get better after 6 weeks.  You have very bad pain.  You get any of these problems in any part of your body: ? Tingling. ? Weakness. ? Loss of feeling (numbness). Get help right away if:  You cannot move your arms or legs.  You cannot control when you pee (urinate) or poop (have a bowel movement).  You feel dizzy.  You faint.  You have trouble breathing. This information is not intended to replace advice given to you by your health care provider. Make sure you discuss any questions you have with your health care provider. Document Released: 11/04/2013 Document Revised: 02/17/2016 Document Reviewed: 12/17/2015 Elsevier Interactive Patient Education  [employer].

## 2018-08-29 NOTE — Progress Notes (Signed)
Subjective: CC: Chronic low back pain PCP: Raliegh Ip, DO [id number] Uplinger is a 58 y.o. female presenting to clinic today for:  1.  Chronic low back pain Patient reports 1.5 year history of left-sided low back pain that radiates to the left lower extremity.  She notes that it is a tooth ache-like pain that is constant and exacerbated by walking.  Pain had been 5/10 for some time but has gotten as bad as 8/10. She is able to stand most the time at work and does not have to walk around much and is able to tolerate the pain.  She has been taking 400 mg of ibuprofen with 2 Tylenol and 1 gabapentin at night in efforts to relieve pain.  She is also started taking this sometimes after work and therefore is running out of the gabapentin sooner.  She denies any numbness and tingling in the left lower extremity.  No falls but she does feel like there is some weakness in the left lower extremity that could result in a fall.  No saddle anesthesia, fecal incontinence or urinary retention.  She has a history of low back surgery with cyst removal in 2016 at [facility] in [place].  Unfortunately, her insurance prohibits her quite a bit and she was told that the only place that would accept her insurance was [facility], specifically Dr. Mickie Kay.  She is asking for referral to their office today.   ROS: Per HPI  Allergies  Allergen Reactions  . Penicillins Hives  . Meloxicam Hypertension  . Bactrim [Sulfamethoxazole-Trimethoprim] Rash   Past Medical History:  Diagnosis Date  . Anal fissure    history of  . Asthma   . Depression   . Diabetes mellitus without complication (HCC)   . DM (diabetes mellitus) (HCC) 10/19/2012  . Hypertension   . Lichen    hands and feet  . Obesity   . 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  albuterol (PROAIR HFA) 108 (90 BASE) MCG/ACT inhaler, Inhale 2 puffs into  the lungs every 6 (six) hours as needed for wheezing or shortness of breath., Disp: 3.7 g, Rfl: 6 .  amLODipine (NORVASC) 2.5 MG tablet, Take 1 tablet (2.5 mg total) by mouth daily., Disp: 90 tablet, Rfl: 3 .  atenolol (TENORMIN) 50 MG tablet, Take 1 tablet (50 mg total) by mouth daily., Disp: 30 tablet, Rfl: 12 .  atorvastatin (LIPITOR) 10 MG tablet, Take 1 tablet (10 mg total) by mouth daily., Disp: 90 tablet, Rfl: 3 .  budesonide-formoterol (SYMBICORT) 160-4.5 MCG/ACT inhaler, Inhale 2 puffs into the lungs 2 (two) times daily. (Patient not taking: Reported on 07/10/2018), Disp: 1 Inhaler, Rfl: 3 .  gabapentin (NEURONTIN) 300 MG capsule, Take 1 capsule (300 mg total) by mouth at bedtime., Disp: 30 capsule, Rfl: 12 .  lisinopril-hydrochlorothiazide (PRINZIDE,ZESTORETIC) 20-25 MG tablet, Take 1 tablet by mouth daily., Disp: 30 tablet, Rfl: 12 .  Melatonin 1 MG TABS, Take by mouth., Disp: , Rfl:  .  metFORMIN (GLUCOPHAGE) 500 MG tablet, Take 1 tablet (500 mg total) by mouth 2 (two) times daily with a meal., Disp: 60 tablet, Rfl: 2 .  Vitamin D, Ergocalciferol, (DRISDOL) 50000 units CAPS capsule, Take 50,000 Units by mouth once a week., Disp: , Rfl: 5 Social History   Socioeconomic History  . Marital status: Divorced    Spouse name: Not on file  . Number  of children: 1  . Years of education: Not on file  . Highest education level: Not on file  Occupational History  . 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 Financial resource strain: Not on file  . Food insecurity:    Worry: Not on file    Inability: Not on file  . Transportation needs:    Medical: Not on file    Non-medical: Not on file  Tobacco Use  . Smoking status: Current Every Day Smoker    Packs/day: 0.50    Types: Cigarettes    Start date: 10/18/1977  . Smokeless tobacco: Never Used  Substance and Sexual Activity  . Alcohol use: No  . Drug use: No  . Sexual activity: Not on file  Lifestyle  .  Physical activity:    Days per week: Not on file    Minutes per session: Not on file  . Stress: Not on file  Relationships  . Social connections:    Talks on phone: Not on file    Gets together: Not on file    Attends religious service: Not on file    Active member of club or organization: Not on file    Attends meetings of clubs or organizations: Not on file    Relationship status: Not on file  . Intimate partner violence:    Fear of current or ex partner: Not on file    Emotionally abused: Not on file    Physically abused: Not on file    Forced sexual activity: Not on file  Other Topics Concern  . Not on file  Social History Narrative  . Not on file   Family History  Problem Relation Age of Onset  . Diabetes Mother   . Hypertension Father   . Colon cancer Neg Hx   . Esophageal cancer Neg Hx   . Rectal cancer Neg Hx   . Stomach cancer Neg Hx     Objective: Office vital signs reviewed. BP 109/73   Pulse (!) 50   Temp 98.2 F (36.8 C) (Oral)   Ht 5\' 5"  (1.651 m)   Wt 215 lb (97.5 kg)   BMI 35.78 kg/m   Physical Examination:  General: Awake, alert, obese, No acute distress Extremities: warm, well perfused, No edema, cyanosis or clubbing; +2 pulses bilaterally MSK: antalgic gait and stiff station  Lumbar spine: Patient has full active range of motion in flexion.  Extension is somewhat reduced as is rotation.  No midline tenderness palpation.  She does have paraspinal muscle tenderness palpation, particularly at the L5 and S1 levels.  She has tenderness to palpation over the lumbosacral and sacroiliac junction on the left.  She has a positive straight leg raise on the left. Skin: dry; intact; no rashes or lesions Neuro: 5/5 RLE Strength; 3/5 hip flexion.  LE light touch sensation in tact.  Assessment/ Plan: 58 y.o. female   1. Chronic left-sided low back pain with left-sided sciatica Acute on chronic left-sided low back pain with radicular symptoms.  I have ordered  an MRI of her back, given history of lumbar surgery in the past.  She has a positive straight leg raise on exam as well as left lower extremity weakness.  I suspect herniated disc with possible impingement on nerve.  I have also placed a referral to the neurosurgeon that she requested.  Reasons for return and emergent evaluation emergency department discussed.  She will follow-up PRN. - MR Lumbar Spine  Wo Contrast; Future - Ambulatory referral to Neurosurgery  2. History of back surgery As above - MR Lumbar Spine Wo Contrast; Future - Ambulatory referral to Neurosurgery  3. Asthma Refill sent of Symbicort and albuterol today - albuterol (PROAIR HFA) 108 (90 Base) MCG/ACT inhaler; Inhale 2 puffs into the lungs every 6 (six) hours as needed for wheezing or shortness of breath.  Dispense: 3.7 g; Refill: 6   Orders Placed This Encounter  Procedures  . MR Lumbar Spine Wo Contrast    Standing Status:   Future    Standing Expiration Date:   10/28/2019    Order Specific Question:   What is the patient's sedation requirement?    Answer:   No Sedation    Order Specific Question:   Does the patient have a pacemaker or implanted devices?    Answer:   No    Order Specific Question:   Preferred imaging location?    Answer:   [facility] (table limit-350lbs)    Order Specific Question:   Radiology Contrast Protocol - do NOT remove file path    Answer:   [email or web address]  . Ambulatory referral to Neurosurgery    Referral Priority:   Routine    Referral Type:   Surgical    Referral Reason:   Specialty Services Required    Requested Specialty:   Neurosurgery    Number of Visits Requested:   1   Meds ordered this encounter  Medications  . gabapentin (NEURONTIN) 300 MG capsule    Sig: Take 1 capsule (300 mg total) by mouth 2 (two) times daily.    Dispense:  60 capsule    Refill:  12  . albuterol (PROAIR HFA) 108 (90 Base) MCG/ACT inhaler    Sig: Inhale 2 puffs  into the lungs every 6 (six) hours as needed for wheezing or shortness of breath.    Dispense:  3.7 g    Refill:  6  . budesonide-formoterol (SYMBICORT) 160-4.5 MCG/ACT inhaler    Sig: Inhale 2 puffs into the lungs 2 (two) times daily.    Dispense:  1 Inhaler    Refill:  3     Nancy Thornton Hulen SkainsM Markiyah Gahm, DO [facility] [phone]

## 2018-09-11 ENCOUNTER — Other Ambulatory Visit: Payer: Self-pay | Admitting: *Deleted

## 2018-09-11 ENCOUNTER — Ambulatory Visit (HOSPITAL_COMMUNITY)
Admission: RE | Admit: 2018-09-11 | Discharge: 2018-09-11 | Disposition: A | Discharge status: Home / Self Care | Payer: PRIVATE HEALTH INSURANCE | Source: Ambulatory Visit | Attending: Family Medicine | Admitting: Family Medicine

## 2018-09-11 ENCOUNTER — Telehealth: Payer: Self-pay | Admitting: Family Medicine

## 2018-09-11 DIAGNOSIS — G8929 Other chronic pain: Secondary | ICD-10-CM | POA: Diagnosis present

## 2018-09-11 DIAGNOSIS — M7138 Other bursal cyst, other site: Secondary | ICD-10-CM

## 2018-09-11 DIAGNOSIS — Z9889 Other specified postprocedural states: Secondary | ICD-10-CM | POA: Diagnosis present

## 2018-09-11 DIAGNOSIS — M5442 Lumbago with sciatica, left side: Principal | ICD-10-CM

## 2018-09-11 DIAGNOSIS — R9389 Abnormal findings on diagnostic imaging of other specified body structures: Secondary | ICD-10-CM

## 2018-09-11 DIAGNOSIS — M5126 Other intervertebral disc displacement, lumbar region: Secondary | ICD-10-CM

## 2018-09-11 NOTE — Progress Notes (Signed)
Neur ? ?

## 2018-09-11 NOTE — Telephone Encounter (Signed)
I attempted to contact patient with regards to her recent MRI of the lumbar spine.  There does appear to be a new large synovial cyst within the L4/L5 region that is impinging the nerve roots.  I think that this is likely the cause of her symptoms.  She also has a new shallow broad-based protrusion on L5-S1.  I do recommend that she sees neurosurgery since this does seem to be causing symptoms.  Please again ask her which neurosurgeon she would like [place] to refer her to so that we can coordinate this.  Mr Lumbar Spine Wo Contrast  Result Date: 09/11/2018 CLINICAL DATA:  Low back pain radiating into the left leg for more than 2 years. History of prior lumbar surgery 01/14/2015. EXAM: MRI LUMBAR SPINE WITHOUT CONTRAST TECHNIQUE: Multiplanar, multisequence MR imaging of the lumbar spine was performed. No intravenous contrast was administered. COMPARISON:  MRI lumbar spine 11/20/2014. FINDINGS: Segmentation:  Standard. Alignment:  Trace facet mediated anterolisthesis L4 on L5 noted. Vertebrae: No fracture or worrisome lesion. A few small hemangiomas are seen. Conus medullaris and cauda equina: Conus extends to the L1 level. Conus and cauda equina appear normal. Paraspinal and other soft tissues: Small cyst in the liver is identified. Otherwise negative. Disc levels: T11-12: Minimal disc bulge without stenosis. T11-12: Mild facet degenerative change.  Otherwise negative. L1-2: Minimal disc bulge without stenosis. L2-3: Shallow disc bulge and mild facet degenerative change without stenosis. L3-4: Dorsal epidural fat is prominent. Disc bulge eccentrically prominent to the left. There is mild to moderate compression of the thecal sac by disc and epidural fat. The foramina are open. The appearance is unchanged. L4-5: Bilateral facet arthropathy is present. A new synovial cyst off the medial margin of the left facet measures 1.3 cm craniocaudal by 1.4 cm transverse by 1 cm AP. The synovial cyst deforms the left aspect of  thecal sac and impinges on both the descending left L4 and L5 roots. The patient is status post right laminectomy for resection of a previously seen synovial cyst. A thin synovial cyst off the medial margin of the left facet measures 1.4 cm AP by 0.3 cm transverse by 0.9 cm craniocaudal. The cyst could impact the descending right L5 root. The right foramen is open at this level. L5-S1: New shallow broad-based right paracentral protrusion slightly deflects the descending right S1 root. The root is not compressed. The central canal and foramina are open. 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 Synovial cyst off the medial margin of the right facet seen on the prior MRI has been resected. A thin synovial cyst off the right facet impinges on the descending right L5 root. New shallow broad-based right paracentral protrusion at L5-S1 slightly deflects the descending right S1 root but the root is not compressed. Electronically Signed   By: Christee Kanner M.D.   On: 09/11/2018 12:44    Sophonie Goforth M. Nadine Counts, DO [facility]

## 2018-09-11 NOTE — Telephone Encounter (Signed)
Patient talked to [facility] about MRI

## 2018-09-11 NOTE — Telephone Encounter (Signed)
Aware of results. 

## 2018-10-04 ENCOUNTER — Encounter: Payer: Self-pay | Admitting: Family Medicine

## 2018-10-04 ENCOUNTER — Other Ambulatory Visit: Payer: Self-pay

## 2018-10-04 ENCOUNTER — Ambulatory Visit (INDEPENDENT_AMBULATORY_CARE_PROVIDER_SITE_OTHER): Payer: PRIVATE HEALTH INSURANCE | Admitting: Family Medicine

## 2018-10-04 VITALS — BP 139/93 | HR 73 | Temp 97.5°F | Ht 65.0 in | Wt 219.0 lb

## 2018-10-04 DIAGNOSIS — Z720 Tobacco use: Secondary | ICD-10-CM | POA: Diagnosis not present

## 2018-10-04 DIAGNOSIS — K1321 Leukoplakia of oral mucosa, including tongue: Secondary | ICD-10-CM | POA: Diagnosis not present

## 2018-10-04 NOTE — Progress Notes (Signed)
Subjective: RV:IFBPPH PCP: Raliegh Ip, DO [id number] Nancy Thornton is a 58 y.o. female presenting to clinic today for:  1. Leukoplakia Patient with known leukoplakia.  She is being followed by Dr. Pollyann Kennedy with ear nose and throat in [place].  She notes that she has a new lesion at the tip of her tongue which concerned her.  She also feels like the lesion on the posterior tongue seems to be progressing and is somewhat ulcerated.  She was instructed to contact the ENT physician if symptoms changed as these could be indications of early cancer.  She is still an active every day smoker and goes on to explain that she has been unsuccessful in quitting though she has the patches at home.  She denies any significant pain.  She does feel like the glands in the left side of her neck are somewhat more swollen than normal.   ROS: Per HPI  Allergies  Allergen Reactions  . Penicillins Hives  . Meloxicam Hypertension  . Bactrim [Sulfamethoxazole-Trimethoprim] Rash   Past Medical History:  Diagnosis Date  . Anal fissure    history of  . Asthma   . Depression   . Diabetes mellitus without complication (HCC)   . DM (diabetes mellitus) (HCC) 10/19/2012  . Hypertension   . Lichen    hands and feet  . Obesity   . 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  albuterol (PROAIR HFA) 108 (90 Base) MCG/ACT inhaler, Inhale 2 puffs into the lungs every 6 (six) hours as needed for wheezing or shortness of breath., Disp: 3.7 g, Rfl: 6 .  atenolol (TENORMIN) 50 MG tablet, Take 1 tablet (50 mg total) by mouth daily., Disp: 30 tablet, Rfl: 12 .  budesonide-formoterol (SYMBICORT) 160-4.5 MCG/ACT inhaler, Inhale 2 puffs into the lungs 2 (two) times daily., Disp: 1 Inhaler, Rfl: 3 .  gabapentin (NEURONTIN) 300 MG capsule, Take 1 capsule (300 mg total) by mouth 2 (two) times daily., Disp: 60 capsule, Rfl: 12 .  lisinopril-hydrochlorothiazide  (PRINZIDE,ZESTORETIC) 20-25 MG tablet, Take 1 tablet by mouth daily., Disp: 30 tablet, Rfl: 12 .  Melatonin 1 MG TABS, Take by mouth., Disp: , Rfl:  .  metFORMIN (GLUCOPHAGE) 500 MG tablet, Take 1 tablet (500 mg total) by mouth 2 (two) times daily with a meal., Disp: 60 tablet, Rfl: 2 .  Vitamin D, Ergocalciferol, (DRISDOL) 50000 units CAPS capsule, Take 50,000 Units by mouth once a week., Disp: , Rfl: 5 .  amLODipine (NORVASC) 2.5 MG tablet, Take 1 tablet (2.5 mg total) by mouth daily., Disp: 90 tablet, Rfl: 3 .  atorvastatin (LIPITOR) 10 MG tablet, Take 1 tablet (10 mg total) by mouth daily., Disp: 90 tablet, Rfl: 3 Social History   Socioeconomic History  . Marital status: Divorced    Spouse name: Not on file  . Number of children: 1  . Years of education: Not on file  . Highest education level: Not on file  Occupational History  . 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 Financial resource strain: Not on file  . Food insecurity:    Worry: Not on file    Inability: Not on file  . Transportation needs:    Medical: Not on file    Non-medical: Not on file  Tobacco Use  . Smoking status: Current Every Day Smoker    Packs/day: 0.50    Types: Cigarettes  Start date: 10/18/1977  . Smokeless tobacco: Never Used  Substance and Sexual Activity  . Alcohol use: No  . Drug use: No  . Sexual activity: Not on file  Lifestyle  . Physical activity:    Days per week: Not on file    Minutes per session: Not on file  . Stress: Not on file  Relationships  . Social connections:    Talks on phone: Not on file    Gets together: Not on file    Attends religious service: Not on file    Active member of club or organization: Not on file    Attends meetings of clubs or organizations: Not on file    Relationship status: Not on file  . Intimate partner violence:    Fear of current or ex partner: Not on file    Emotionally abused: Not on file    Physically abused:  Not on file    Forced sexual activity: Not on file  Other Topics Concern  . Not on file  Social History Narrative  . Not on file   Family History  Problem Relation Age of Onset  . Diabetes Mother   . Hypertension Father   . Colon cancer Neg Hx   . Esophageal cancer Neg Hx   . Rectal cancer Neg Hx   . Stomach cancer Neg Hx     Objective: Office vital signs reviewed. BP (!) 139/93   Pulse 73   Temp (!) 97.5 F (36.4 C) (Oral)   Ht 5\' 5"  (1.651 m)   Wt 219 lb (99.3 kg)   BMI 36.44 kg/m   Physical Examination:  General: Awake, alert, nontoxic, No acute distress HEENT: Normal    Neck: No masses palpated. Mild fullness of the submandibular glands.  No over lymphadenopathy appreciated    Eyes: sclera white    Throat: moist mucus membranes, no erythema, tonsils are surgically absent.  She does have several white plaques appreciated along the tip of the tongue on the right side and on the right posterior aspect of the tongue.  No overt ulceration appreciated.  Airway is patent  Assessment/ Plan: 58 y.o. female   1. Leukoplakia of tongue I have advised her to proceed with contacting Dr. Pollyann Kennedy for further recommendations.  She may benefit from biopsy.  I have again advised her to stop smoking.  She has the nicotine patches at home as well as the support from the national nicotine hotline.  We again discussed the risks of this progressing into oral cancer given her ongoing smoking status.  2. Tobacco use Contemplative.   No orders of the defined types were placed in this encounter.  No orders of the defined types were placed in this encounter.    Raliegh Ip, DO [facility] [phone]

## 2018-10-11 ENCOUNTER — Telehealth: Payer: Self-pay

## 2018-10-11 ENCOUNTER — Other Ambulatory Visit: Payer: Self-pay | Admitting: Family Medicine

## 2018-10-11 DIAGNOSIS — K1321 Leukoplakia of oral mucosa, including tongue: Secondary | ICD-10-CM

## 2018-10-11 NOTE — Progress Notes (Signed)
rr

## 2018-10-11 NOTE — Telephone Encounter (Signed)
Patient needs a referral to ENT for Leukoplakia.  Her insurance will only pay for her to see Karle Barr in [place].

## 2018-10-21 ENCOUNTER — Other Ambulatory Visit: Payer: Self-pay | Admitting: Family Medicine

## 2018-10-21 DIAGNOSIS — E1165 Type 2 diabetes mellitus with hyperglycemia: Secondary | ICD-10-CM

## 2018-10-24 ENCOUNTER — Other Ambulatory Visit: Payer: Self-pay | Admitting: Family Medicine

## 2018-10-24 DIAGNOSIS — E1165 Type 2 diabetes mellitus with hyperglycemia: Secondary | ICD-10-CM

## 2018-11-19 ENCOUNTER — Other Ambulatory Visit: Payer: Self-pay | Admitting: Neurosurgery

## 2018-11-19 DIAGNOSIS — M5416 Radiculopathy, lumbar region: Secondary | ICD-10-CM

## 2018-11-21 ENCOUNTER — Encounter: Payer: Self-pay | Admitting: Physical Therapy

## 2018-11-21 ENCOUNTER — Ambulatory Visit: Payer: PRIVATE HEALTH INSURANCE | Attending: Neurosurgery | Admitting: Physical Therapy

## 2018-11-21 ENCOUNTER — Other Ambulatory Visit: Payer: Self-pay

## 2018-11-21 DIAGNOSIS — M545 Low back pain, unspecified: Secondary | ICD-10-CM

## 2018-11-21 DIAGNOSIS — G8929 Other chronic pain: Secondary | ICD-10-CM | POA: Diagnosis present

## 2018-11-21 DIAGNOSIS — M6281 Muscle weakness (generalized): Secondary | ICD-10-CM | POA: Insufficient documentation

## 2018-11-21 NOTE — Therapy (Signed)
[facility] [address] [place], [place], [address] Phone: [phone]   Fax:  [phone]  Physical Therapy Evaluation  Patient Details  Name: Phatima Toste MRN: [id number] Date of Birth: 1960-11-25 Referring Provider (PT): Bethel Born, MD   Encounter Date: 11/21/2018  PT End of Session - 11/21/18 5974    Visit Number  1    Number of Visits  6    Date for PT Re-Evaluation  01/02/19    PT Start Time  0815    PT Stop Time  0901    PT Time Calculation (min)  46 min    Activity Tolerance  Patient tolerated treatment well    Behavior During Therapy  [facility] for tasks assessed/performed       Past Medical History:  Diagnosis Date  . Anal fissure    history of  . Asthma   . Depression   . Diabetes mellitus without complication (HCC)   . DM (diabetes mellitus) (HCC) 10/19/2012  . Hypertension   . Lichen    hands and feet  . Obesity   . 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 BACK SURGERY    . CHOLECYSTECTOMY    . CYSTOSTOMY W/ BLADDER BIOPSY  1990   punctured hole in bladder, had open procedure to repair  . TONSILLECTOMY    . TOTAL VAGINAL HYSTERECTOMY     58 y/o. bleeding. ovaries left in situ    There were no vitals filed for this visit.   Subjective Assessment - 11/21/18 0940    Subjective  COVID-19 screening performed prior to patient entering the building. Patient arrives to physical therapy with reports of low back pain that radiates down to lateral ankle that began about a year ago. Patient reported MRI has shown a synovial cyst off the medial margin of the facet joint of L4-L5. Patient reports difficulties with sitting for greater than 30 minutes and walking or standing for long periods of time. Patient reports she is able to perform all ADLs but with pain. Patient reports pain at worst is 9/10 and pain at best is 5/10 which  is relieved by medication. Patient's goals are to decrease pain, improve ability to perform work activities, sleep greater than 4 hours, and improve standing, sitting, and walking tolerance.    Pertinent History  HTN, DM, Asthma    Limitations  Sitting;Standing;Walking;House hold activities    How long can you sit comfortably?  30 minutes    How long can you stand comfortably?  15-20 minutes    How long can you walk comfortably?  15-20 minutes    Diagnostic tests  MRI: see media    Patient Stated Goals  Decrease pain and stop tingling down leg    Currently in Pain?  Yes    Pain Score  6     Pain Location  Back    Pain Orientation  Left    Pain Descriptors / Indicators  Tingling;Shooting;Numbness;Tender    Pain Type  Chronic pain    Pain Radiating Towards  to left lateral ankle    Pain Onset  More than a month ago    Pain Frequency  Constant    Aggravating Factors   sitting, standing, or walking for long period of time    Pain Relieving Factors  "gabapentin and 2 tylenol, takes the edge off"    Effect of Pain  on Daily Activities  standing, walking, recreational activities         [facility] PT Assessment - 11/21/18 0001      Assessment   Medical Diagnosis  lumbar radiculopathy, Synovial Cyst of lumbar facet joint    Referring Provider (PT)  Bethel Born, MD    Onset Date/Surgical Date  --   over a year ago   Next MD Visit  December 19, 2018    Prior Therapy  yes      Precautions   Precautions  None      Restrictions   Weight Bearing Restrictions  No      Balance Screen   Has the patient fallen in the past 6 months  No    Has the patient had a decrease in activity level because of a fear of falling?   No    Is the patient reluctant to leave their home because of a fear of falling?   No      Home Environment   Living Environment  Private residence      Prior Function   Level of Independence  Independent      Posture/Postural Control   Posture/Postural Control   Postural limitations    Postural Limitations  Rounded Shoulders;Forward head;Decreased lumbar lordosis;Flexed trunk      Deep Tendon Reflexes   DTR Assessment Site  Patella;Achilles    Patella DTR  1+    Achilles DTR  1+      ROM / Strength   AROM / PROM / Strength  Strength      Strength   Strength Assessment Site  Hip;Knee    Right/Left Hip  Left;Right    Right Hip Flexion  4+/5    Right Hip Extension  4+/5    Right Hip ABduction  4+/5    Left Hip Flexion  4-/5    Left Hip ABduction  3+/5    Right/Left Knee  Right;Left    Right Knee Flexion  4+/5    Right Knee Extension  4+/5    Left Knee Flexion  4-/5    Left Knee Extension  4-/5      Palpation   Palpation comment  tenderness to palpation to lumbar paraspinals, left QL, and left glutes, increased tone in lumbar paraspinals and QL.      Transfers   Comments  supine to long sit transition.      Ambulation/Gait   Gait Pattern  Step-through pattern;Decreased stance time - left;Decreased weight shift to left;Antalgic                Objective measurements completed on examination: See above findings.              PT Education - 11/21/18 0914    Education Details  draw ins, glute sets, hip adduction, hip abduction, piriformis stretch (supine and sitting), hamstring stretch    Person(s) Educated  Patient    Methods  Explanation;Demonstration;Handout    Comprehension  Verbalized understanding;Returned demonstration          PT Long Term Goals - 11/21/18 0916      PT LONG TERM GOAL #1   Title  Patient will be independent with HEP    Time  6    Period  Weeks    Status  New      PT LONG TERM GOAL #2   Title  Patient will report ability to perform ADLS with low back pain less than 4/10.  Time  6    Period  Weeks    Status  New      PT LONG TERM GOAL #3   Title  Patient will demonstrate 4+/5 or greater left LE MMT to improve stability during functional tasks.    Time  6    Period  Weeks     Status  New      PT LONG TERM GOAL #4   Title                Plan - 11/21/18 0946    Clinical Impression Statement  Patient is a 58 year old female who presents to physical therapy with decreased left LE MMT and pain in the left low back. Patient noted with standing with increased weight shift to right. Patient noted with increased muscle tone in bilateral lumbar paraspinals and left QL upon palpation. Patient reported tenderness to palpation to left lower lumbar paraspinals, left QL, and left upper gluteals with intermittent tingling to left lateral aspect of the foot with moderate pressure. Patient demonstrated diminished left patella and achilles deep tendon reflexes. Patient ambulates with an antalgic gait pattern and increased weight shift to the right. Patient and PT reviewed HEP to which patient reported understanding. Patient would benefit from skilled physical therapy to address deficits and address patient's goals.     Personal Factors and Comorbidities  Comorbidity 3+    Comorbidities  HTN, DM, Asthma    Examination-Activity Limitations  Bend;Sit;Stand    Stability/Clinical Decision Making  Evolving/Moderate complexity    Clinical Decision Making  Low    Rehab Potential  Good    PT Frequency  1x / week    PT Duration  6 weeks    PT Treatment/Interventions  ADLs/Self Care Home Management;Electrical Stimulation;Moist Heat;Cryotherapy;Ultrasound;Therapeutic activities;Therapeutic exercise;Functional mobility training;Stair training;Gait training;Balance training;Neuromuscular re-education;Manual techniques;Dry needling;Passive range of motion;Patient/family education    PT Next Visit Plan  Nustep, LE stretching and strengthening, modalities PRN for pain relief    PT Home Exercise Plan  see patient education section    Consulted and Agree with Plan of Care  Patient       Patient will benefit from skilled therapeutic intervention in order to improve the following deficits and  impairments:  Pain, Postural dysfunction, Difficulty walking, Decreased strength  Visit Diagnosis: Chronic low back pain, unspecified back pain laterality, unspecified whether sciatica present - Plan: PT plan of care cert/re-cert  Muscle weakness (generalized) - Plan: PT plan of care cert/re-cert     Problem List Patient Active Problem List   Diagnosis Date Noted  . Tobacco use 07/10/2018  . Morbid obesity (HCC) 07/10/2018  . 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 Hypertension associated with diabetes (HCC) 10/19/2012  . Type 2 diabetes mellitus with hyperglycemia, without long-term current use of insulin (HCC) 10/19/2012  . Hyperlipidemia associated with type 2 diabetes mellitus (HCC) 10/19/2012   Guss BundeKrystle Martrell Eguia, PT, DPT 11/21/2018, 10:00 AM  [facility] [address] [place], [place], [address] Phone: [phone]   Fax:  [phone]  Name: Candi LeashDrusilla Corprew MRN: [id number] Date of Birth: May 26, 1961

## 2018-11-23 ENCOUNTER — Other Ambulatory Visit: Payer: Self-pay | Admitting: Neurosurgery

## 2018-11-23 DIAGNOSIS — M5416 Radiculopathy, lumbar region: Secondary | ICD-10-CM

## 2018-11-23 DIAGNOSIS — M7138 Other bursal cyst, other site: Secondary | ICD-10-CM

## 2018-11-28 ENCOUNTER — Ambulatory Visit: Payer: PRIVATE HEALTH INSURANCE | Admitting: Physical Therapy

## 2018-11-28 ENCOUNTER — Other Ambulatory Visit: Payer: Self-pay

## 2018-11-28 DIAGNOSIS — G8929 Other chronic pain: Secondary | ICD-10-CM

## 2018-11-28 DIAGNOSIS — M545 Low back pain, unspecified: Secondary | ICD-10-CM

## 2018-11-28 DIAGNOSIS — M6281 Muscle weakness (generalized): Secondary | ICD-10-CM

## 2018-11-28 NOTE — Therapy (Signed)
[facility] [address] [place], [place], [address] Phone: [phone]   Fax:  [phone]  Physical Therapy Treatment  Patient Details  Name: Nancy Thornton MRN: [id number] Date of Birth: September 01, 1960 Referring Provider (PT): Bethel Bornhester Kossman Yarbrough, MD   Encounter Date: 11/28/2018  PT End of Session - 11/28/18 1230    Visit Number  2    Number of Visits  6    Date for PT Re-Evaluation  01/02/19    PT Start Time  1112    PT Stop Time  1208    PT Time Calculation (min)  56 min    Activity Tolerance  Patient tolerated treatment well    Behavior During Therapy  [facility] for tasks assessed/performed       Past Medical History:  Diagnosis Date  . Anal fissure    history of  . Asthma   . Depression   . Diabetes mellitus without complication (HCC)   . DM (diabetes mellitus) (HCC) 10/19/2012  . Hypertension   . Lichen    hands and feet  . Obesity   . 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 BACK SURGERY    . CHOLECYSTECTOMY    . CYSTOSTOMY W/ BLADDER BIOPSY  1990   punctured hole in bladder, had open procedure to repair  . TONSILLECTOMY    . TOTAL VAGINAL HYSTERECTOMY     58 y/o. bleeding. ovaries left in situ    There were no vitals filed for this visit.  Subjective Assessment - 11/28/18 1225    Subjective  COVID-19 screen performed prior to patient entering clinic.  I'm hurting a lot.    Currently in Pain?  Yes    Pain Score  8     Pain Location  Back    Pain Orientation  Left    Pain Descriptors / Indicators  Tingling;Shooting;Numbness;Tender    Pain Type  Chronic pain    Pain Onset  More than a month ago                       [facility]/Exercise - 11/28/18 0001      Modalities   Modalities  Electrical Stimulation;Moist Heat;Ultrasound      Moist Heat Therapy   Number Minutes Moist Heat  20 Minutes    Moist Heat Location  Lumbar Spine      Electrical Stimulation   Electrical Stimulation Location  Left LB/upper gluteal region.    Electrical Stimulation Action  IFC    Electrical Stimulation Parameters  80-150 Hz x 20 minutes.    Electrical Stimulation Goals  Tone;Pain      Ultrasound   Ultrasound Location  Left LB    Ultrasound Parameters  Combo e'stim/U/S at 1.50 W/CM2 x 12 minutes.    Ultrasound Goals  Pain      Manual Therapy   Manual Therapy  Soft tissue mobilization    Soft tissue mobilization  Left sdly position with pillow between knees for comfort:  STW/M to affected left low back x 12 minutes.                  PT Long Term Goals - 11/21/18 0916      PT LONG TERM GOAL #1   Title  Patient will be independent with HEP    Time  6    Period  Weeks    Status  New      PT LONG TERM GOAL #2   Title  Patient will report ability to perform ADLS with low back pain less than 4/10.     Time  6    Period  Weeks    Status  New      PT LONG TERM GOAL #3   Title  Patient will demonstrate 4+/5 or greater left LE MMT to improve stability during functional tasks.    Time  6    Period  Weeks    Status  New      PT LONG TERM GOAL #4   Title               Plan - 11/28/18 1228    Clinical Impression Statement  Patient continues to have a great deal of pain.  She did well with treatment today.      PT Treatment/Interventions  ADLs/Self Care Home Management;Electrical Stimulation;Moist Heat;Cryotherapy;Ultrasound;Therapeutic activities;Therapeutic exercise;Functional mobility training;Stair training;Gait training;Balance training;Neuromuscular re-education;Manual techniques;Dry needling;Passive range of motion;Patient/family education       Patient will benefit from skilled therapeutic intervention in order to improve the following deficits and impairments:     Visit Diagnosis: Chronic low back pain, unspecified back pain laterality, unspecified whether sciatica  present  Muscle weakness (generalized)     Problem List Patient Active Problem List   Diagnosis Date Noted  . Tobacco use 07/10/2018  . Morbid obesity (HCC) 07/10/2018  . 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 Hypertension associated with diabetes (HCC) 10/19/2012  . Type 2 diabetes mellitus with hyperglycemia, without long-term current use of insulin (HCC) 10/19/2012  . Hyperlipidemia associated with type 2 diabetes mellitus (HCC) 10/19/2012    Donnell Wion, [place] MPT 11/28/2018, 12:30 PM  [facility] [address] [place], [place], [address] Phone: [phone]   Fax:  [phone]  Name: Nancy Thornton MRN: [id number] Date of Birth: Nov 24, 1960

## 2018-12-05 ENCOUNTER — Ambulatory Visit: Payer: PRIVATE HEALTH INSURANCE | Attending: Neurosurgery | Admitting: Physical Therapy

## 2018-12-05 ENCOUNTER — Other Ambulatory Visit: Payer: Self-pay

## 2018-12-05 ENCOUNTER — Encounter: Payer: Self-pay | Admitting: Physical Therapy

## 2018-12-05 DIAGNOSIS — G8929 Other chronic pain: Secondary | ICD-10-CM | POA: Insufficient documentation

## 2018-12-05 DIAGNOSIS — M5442 Lumbago with sciatica, left side: Secondary | ICD-10-CM | POA: Insufficient documentation

## 2018-12-05 DIAGNOSIS — M545 Low back pain, unspecified: Secondary | ICD-10-CM

## 2018-12-05 DIAGNOSIS — M6281 Muscle weakness (generalized): Secondary | ICD-10-CM | POA: Diagnosis present

## 2018-12-05 NOTE — Therapy (Signed)
[facility] [address] [place], [place], [address] Phone: [phone]   Fax:  [phone]  Physical Therapy Treatment  Patient Details  Name: Adream Parzych MRN: [id number] Date of Birth: 04-12-1961 Referring Provider (PT): Bethel Born, MD   Encounter Date: 12/05/2018  PT End of Session - 12/05/18 1219    Visit Number  3    Number of Visits  6    Date for PT Re-Evaluation  01/02/19    PT Start Time  1113    PT Stop Time  1208    PT Time Calculation (min)  55 min    Activity Tolerance  Patient tolerated treatment well    Behavior During Therapy  [facility] for tasks assessed/performed       Past Medical History:  Diagnosis Date  . Anal fissure    history of  . Asthma   . Depression   . Diabetes mellitus without complication (HCC)   . DM (diabetes mellitus) (HCC) 10/19/2012  . Hypertension   . Lichen    hands and feet  . Obesity   . 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 BACK SURGERY    . CHOLECYSTECTOMY    . CYSTOSTOMY W/ BLADDER BIOPSY  1990   punctured hole in bladder, had open procedure to repair  . TONSILLECTOMY    . TOTAL VAGINAL HYSTERECTOMY     58 y/o. bleeding. ovaries left in situ    There were no vitals filed for this visit.  Subjective Assessment - 12/05/18 1157    Subjective  COVID-19 screen performed prior to patient entering clinic. Reports ongoing pain and stated last treatment visit she felt a lot of pain but felt better as the day progressed.    Pertinent History  HTN, DM, Asthma    Limitations  Sitting;Standing;Walking;House hold activities    How long can you sit comfortably?  30 minutes    How long can you stand comfortably?  15-20 minutes    How long can you walk comfortably?  15-20 minutes    Diagnostic tests  MRI: see media    Patient Stated Goals  Decrease pain and stop tingling down leg    Currently  in Pain?  Yes    Pain Score  8     Pain Location  Back    Pain Orientation  Left    Pain Descriptors / Indicators  Tingling;Shooting;Numbness;Tender    Pain Type  Chronic pain    Pain Onset  More than a month ago    Pain Frequency  Constant         [facility] PT Assessment - 12/05/18 0001      Assessment   Medical Diagnosis  lumbar radiculopathy, Synovial Cyst of lumbar facet joint    Referring Provider (PT)  Bethel Born, MD    Next MD Visit  December 19, 2018    Prior Therapy  yes      Precautions   Precautions  None                   [facility] Adult PT Treatment/Exercise - 12/05/18 0001      Modalities   Modalities  Electrical Stimulation;Moist Heat;Ultrasound      Moist Heat Therapy   Number Minutes Moist Heat  15 Minutes    Moist Heat Location  Lumbar Spine      Electrical Stimulation  Electrical Stimulation Location  Left LB/upper gluteal region.    Electrical Stimulation Action  IFC    Electrical Stimulation Parameters  80-150 hz x15 mins    Electrical Stimulation Goals  Tone;Pain      Ultrasound   Ultrasound Location  Left low back    Ultrasound Parameters  Combo e-stim//US 100%, 1 mhz, 1.5 w/cm2 x12    Ultrasound Goals  Pain      Manual Therapy   Manual Therapy  Soft tissue mobilization    Soft tissue mobilization  Left sdly position with pillow between knees for comfort:  STW/M to affected left low back x 12 minutes.                  PT Long Term Goals - 11/21/18 0916      PT LONG TERM GOAL #1   Title  Patient will be independent with HEP    Time  6    Period  Weeks    Status  New      PT LONG TERM GOAL #2   Title  Patient will report ability to perform ADLS with low back pain less than 4/10.     Time  6    Period  Weeks    Status  New      PT LONG TERM GOAL #3   Title  Patient will demonstrate 4+/5 or greater left LE MMT to improve stability during functional tasks.    Time  6    Period  Weeks    Status  New       PT LONG TERM GOAL #4   Title               Plan - 12/05/18 1219    Clinical Impression Statement  Patient was able to tolerate treatment fairly well but did report slight increase of pain with sidelying therefore modalities performed in supine. Patient stated hot pack became to hot at end of modalities; skin integrity was assessed and increased redness in the shape of the hot pack was found but no blisters were observed.    Personal Factors and Comorbidities  Comorbidity 3+    Comorbidities  HTN, DM, Asthma    Examination-Activity Limitations  Bend;Sit;Stand    Stability/Clinical Decision Making  Evolving/Moderate complexity    Clinical Decision Making  Low    Rehab Potential  Good    PT Frequency  1x / week    PT Duration  6 weeks    PT Treatment/Interventions  ADLs/Self Care Home Management;Electrical Stimulation;Moist Heat;Cryotherapy;Ultrasound;Therapeutic activities;Therapeutic exercise;Functional mobility training;Stair training;Gait training;Balance training;Neuromuscular re-education;Manual techniques;Dry needling;Passive range of motion;Patient/family education    PT Next Visit Plan  Nustep, LE stretching and strengthening, modalities PRN for pain relief    Consulted and Agree with Plan of Care  Patient       Patient will benefit from skilled therapeutic intervention in order to improve the following deficits and impairments:  Pain, Postural dysfunction, Difficulty walking, Decreased strength  Visit Diagnosis: Chronic low back pain, unspecified back pain laterality, unspecified whether sciatica present  Muscle weakness (generalized)     Problem List Patient Active Problem List   Diagnosis Date Noted  . Tobacco use 07/10/2018  . Morbid obesity (HCC) 07/10/2018  . 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 Hypertension associated with diabetes (HCC) 10/19/2012  . Type 2 diabetes mellitus with hyperglycemia, without long-term current use of insulin (HCC) 10/19/2012  .  Hyperlipidemia associated with type 2 diabetes mellitus (HCC) 10/19/2012  Guss BundeKrystle Aldena Worm, PT, DPT 12/05/2018, 1:15 PM  [facility] [address] [place], [place], [address] Phone: [phone]   Fax:  [phone]  Name: Candi LeashDrusilla Havlin MRN: [id number] Date of Birth: 10-18-1960

## 2018-12-12 ENCOUNTER — Ambulatory Visit: Payer: PRIVATE HEALTH INSURANCE | Admitting: Physical Therapy

## 2018-12-12 ENCOUNTER — Other Ambulatory Visit: Payer: Self-pay

## 2018-12-12 DIAGNOSIS — G8929 Other chronic pain: Secondary | ICD-10-CM

## 2018-12-12 DIAGNOSIS — M545 Low back pain, unspecified: Secondary | ICD-10-CM

## 2018-12-12 DIAGNOSIS — M6281 Muscle weakness (generalized): Secondary | ICD-10-CM

## 2018-12-12 NOTE — Therapy (Signed)
[facility] [address] [place], [place], [address] Phone: [phone]   Fax:  [phone]  Physical Therapy Treatment  Patient Details  Name: Nancy Thornton MRN: [id number] Date of Birth: 14-Nov-1960 Referring Provider (PT): Bethel Bornhester Kossman Yarbrough, MD   Encounter Date: 12/12/2018  PT End of Session - 12/12/18 1255    Visit Number  4    Number of Visits  6    Date for PT Re-Evaluation  01/02/19    PT Start Time  1115    PT Stop Time  1207    PT Time Calculation (min)  52 min       Past Medical History:  Diagnosis Date  . Anal fissure    history of  . Asthma   . Depression   . Diabetes mellitus without complication (HCC)   . DM (diabetes mellitus) (HCC) 10/19/2012  . Hypertension   . Lichen    hands and feet  . Obesity   . 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 BACK SURGERY    . CHOLECYSTECTOMY    . CYSTOSTOMY W/ BLADDER BIOPSY  1990   punctured hole in bladder, had open procedure to repair  . TONSILLECTOMY    . TOTAL VAGINAL HYSTERECTOMY     58 y/o. bleeding. ovaries left in situ    There were no vitals filed for this visit.  Subjective Assessment - 12/12/18 1256    Subjective  COVID-19 screen performed prior to patient entering clinic.  Lots of nerve pain today.    Pertinent History  HTN, DM, Asthma    Limitations  Sitting;Standing;Walking;House hold activities    How long can you sit comfortably?  30 minutes    How long can you stand comfortably?  15-20 minutes    How long can you walk comfortably?  15-20 minutes    Diagnostic tests  MRI: see media    Patient Stated Goals  Decrease pain and stop tingling down leg    Currently in Pain?  Yes    Pain Score  8     Pain Location  Back    Pain Orientation  Left    Pain Descriptors / Indicators  Tingling;Shooting;Numbness;Tender    Pain Type  Chronic pain    Pain Onset  More than  a month ago                       [facility] - 12/12/18 0001      Modalities   Modalities  Electrical Stimulation;Moist Heat;Ultrasound      Moist Heat Therapy   Number Minutes Moist Heat  15 Minutes    Moist Heat Location  Lumbar Spine      Electrical Stimulation   Electrical Stimulation Location  Left low back.    Electrical Stimulation Action  Pre-mod.    Electrical Stimulation Parameters  80-150 Hz x 15 minutes.    Electrical Stimulation Goals  Tone;Pain      Ultrasound   Ultrasound Location  Left low back.    Ultrasound Parameters  Combo e'stim/u/S at 1.50 W/CM2 x 11 minutes.    Ultrasound Goals  Pain      Manual Therapy   Manual Therapy  Soft tissue mobilization    Soft tissue mobilization  Left sadly position with pillow between knees for comfort:  STW/M x 12 minutes including left QL  release technique.                  PT Long Term Goals - 11/21/18 0916      PT LONG TERM GOAL #1   Title  Patient will be independent with HEP    Time  6    Period  Weeks    Status  New      PT LONG TERM GOAL #2   Title  Patient will report ability to perform ADLS with low back pain less than 4/10.     Time  6    Period  Weeks    Status  New      PT LONG TERM GOAL #3   Title  Patient will demonstrate 4+/5 or greater left LE MMT to improve stability during functional tasks.    Time  6    Period  Weeks    Status  New      PT LONG TERM GOAL #4   Title               Plan - 12/12/18 1259    Clinical Impression Statement  Reports of radicular symptoms today.  Patient states treatments help but are temporary.    PT Next Visit Plan  Nustep, LE stretching and strengthening, modalities PRN for pain relief    PT Home Exercise Plan  see patient education section    Consulted and Agree with Plan of Care  Patient       Patient will benefit from skilled therapeutic intervention in order to improve the following deficits and  impairments:  Pain, Postural dysfunction, Difficulty walking, Decreased strength  Visit Diagnosis: Chronic low back pain, unspecified back pain laterality, unspecified whether sciatica present  Muscle weakness (generalized)     Problem List Patient Active Problem List   Diagnosis Date Noted  . Tobacco use 07/10/2018  . Morbid obesity ([place]) 07/10/2018  . 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 Hypertension associated with diabetes ([place]) 10/19/2012  . Type 2 diabetes mellitus with hyperglycemia, without long-term current use of insulin ([place]) 10/19/2012  . Hyperlipidemia associated with type 2 diabetes mellitus ([place]) 10/19/2012    Ahyana Skillin, [place]  MPT 12/12/2018, 1:01 PM  [facility] [address] [place], [place], [address] Phone: [phone]   Fax:  [phone]  Name: Nancy Thornton MRN: [id number] Date of Birth: June 29, 1961

## 2018-12-16 ENCOUNTER — Other Ambulatory Visit: Payer: Self-pay | Admitting: Internal Medicine

## 2018-12-17 ENCOUNTER — Ambulatory Visit (INDEPENDENT_AMBULATORY_CARE_PROVIDER_SITE_OTHER): Payer: PRIVATE HEALTH INSURANCE | Admitting: Otolaryngology

## 2018-12-17 DIAGNOSIS — K1321 Leukoplakia of oral mucosa, including tongue: Secondary | ICD-10-CM

## 2018-12-17 DIAGNOSIS — K219 Gastro-esophageal reflux disease without esophagitis: Secondary | ICD-10-CM | POA: Diagnosis not present

## 2018-12-17 DIAGNOSIS — F17210 Nicotine dependence, cigarettes, uncomplicated: Secondary | ICD-10-CM

## 2018-12-17 DIAGNOSIS — R221 Localized swelling, mass and lump, neck: Secondary | ICD-10-CM | POA: Diagnosis not present

## 2018-12-19 ENCOUNTER — Ambulatory Visit: Payer: PRIVATE HEALTH INSURANCE | Admitting: Physical Therapy

## 2018-12-19 ENCOUNTER — Other Ambulatory Visit: Payer: Self-pay

## 2018-12-19 DIAGNOSIS — G8929 Other chronic pain: Secondary | ICD-10-CM

## 2018-12-19 DIAGNOSIS — M6281 Muscle weakness (generalized): Secondary | ICD-10-CM

## 2018-12-19 DIAGNOSIS — M545 Low back pain, unspecified: Secondary | ICD-10-CM

## 2018-12-19 NOTE — Therapy (Signed)
[facility] [place], [place], [address] Phone: [phone]   Fax:  [phone]  Physical Therapy Treatment  Patient Details  Name: Nancy Thornton MRN: [id number] Date of Birth: December 14, 1960 Referring Provider (PT): Ricard Dillon, MD   Encounter Date: 12/19/2018  PT End of Session - 12/19/18 1303    Visit Number  5    Number of Visits  6    Date for PT Re-Evaluation  01/02/19    PT Start Time  1115    PT Stop Time  1214    PT Time Calculation (min)  59 min    Activity Tolerance  Patient tolerated treatment well    Behavior During Therapy  [facility] for tasks assessed/performed       Past Medical History:  Diagnosis Date  . Anal fissure    history of  . Asthma   . Depression   . Diabetes mellitus without complication ([place])   . DM (diabetes mellitus) ([place]) 10/19/2012  . Hypertension   . Lichen    hands and feet  . Obesity   . Substance abuse ([place])    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    Past Surgical History:  Procedure Laterality Date  . BACK SURGERY    . CHOLECYSTECTOMY    . CYSTOSTOMY W/ BLADDER BIOPSY  1990   punctured hole in bladder, had open procedure to repair  . TONSILLECTOMY    . TOTAL VAGINAL HYSTERECTOMY     58 y/o. bleeding. ovaries left in situ    There were no vitals filed for this visit.  Subjective Assessment - 12/19/18 1256    Subjective  COVID-19 screen performed prior to patient entering clinic.  Treatments give me temporary relief.    Currently in Pain?  Yes    Pain Score  7     Pain Location  Back    Pain Orientation  Left    Pain Descriptors / Indicators  Shooting;Numbness;Tingling;Tender    Pain Type  Chronic pain    Pain Onset  More than a month ago                       [facility] Adult PT Treatment/Exercise - 12/19/18 0001      Modalities   Modalities  Electrical Stimulation;Moist Heat;Ultrasound      Moist Heat Therapy   Number Minutes Moist Heat   20 Minutes    Moist Heat Location  Lumbar Spine      Electrical Stimulation   Electrical Stimulation Location  Left low back.    Electrical Stimulation Action  IFC    Electrical Stimulation Parameters  80-150 hz x 20 minutes.    Electrical Stimulation Goals  Tone;Pain      Ultrasound   Ultrasound Location  left low back.    Ultrasound Parameters  Combo e'stim/u/S at 1.50 W/CM2 x 12 minutes.    Ultrasound Goals  Pain      Manual Therapy   Manual Therapy  Soft tissue mobilization    Soft tissue mobilization  From left SDLY position:  STW/M x 12 minutes to reduce tone.                  PT Long Term Goals - 11/21/18 0916      PT LONG TERM GOAL #1   Title  Patient will be independent with HEP    Time  6    Period  Weeks    Status  New      PT LONG TERM GOAL #2   Title  Patient will report ability to perform ADLS with low back pain less than 4/10.     Time  6    Period  Weeks    Status  New      PT LONG TERM GOAL #3   Title  Patient will demonstrate 4+/5 or greater left LE MMT to improve stability during functional tasks.    Time  6    Period  Weeks    Status  New      PT LONG TERM GOAL #4   Title               Plan - 12/19/18 1304    Clinical Impression Statement  Patient with notable tone in her left QL.  Patient receiving temporary relief of symtoms follwing treatment.    PT Treatment/Interventions  ADLs/Self Care Home Management;Electrical Stimulation;Moist Heat;Cryotherapy;Ultrasound;Therapeutic activities;Therapeutic exercise;Functional mobility training;Stair training;Gait training;Balance training;Neuromuscular re-education;Manual techniques;Dry needling;Passive range of motion;Patient/family education       Patient will benefit from skilled therapeutic intervention in order to improve the following deficits and impairments:     Visit Diagnosis: 1. Chronic low back pain, unspecified back pain laterality, unspecified whether sciatica present    2. Muscle weakness (generalized)        Problem List Patient Active Problem List   Diagnosis Date Noted  . Tobacco use 07/10/2018  . Morbid obesity (HCC) 07/10/2018  . 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 Hypertension associated with diabetes (HCC) 10/19/2012  . Type 2 diabetes mellitus with hyperglycemia, without long-term current use of insulin (HCC) 10/19/2012  . Hyperlipidemia associated with type 2 diabetes mellitus (HCC) 10/19/2012    Jesson Foskey, [place] MPT 12/19/2018, 1:08 PM  [facility] [address] [place], [place], [address] Phone: [phone]   Fax:  [phone]  Name: Candi LeashDrusilla Siegmann MRN: [id number] Date of Birth: Sep 03, 1960

## 2018-12-26 ENCOUNTER — Ambulatory Visit: Payer: PRIVATE HEALTH INSURANCE | Admitting: Physical Therapy

## 2018-12-26 ENCOUNTER — Other Ambulatory Visit: Payer: Self-pay

## 2018-12-26 ENCOUNTER — Encounter: Payer: Self-pay | Admitting: Physical Therapy

## 2018-12-26 DIAGNOSIS — G8929 Other chronic pain: Secondary | ICD-10-CM

## 2018-12-26 DIAGNOSIS — M6281 Muscle weakness (generalized): Secondary | ICD-10-CM

## 2018-12-26 DIAGNOSIS — M545 Low back pain, unspecified: Secondary | ICD-10-CM

## 2018-12-26 NOTE — Therapy (Signed)
[facility] [place], [place], [address] Phone: [phone]   Fax:  [phone]  Physical Therapy Treatment  PHYSICAL THERAPY DISCHARGE SUMMARY  Visits from Start of Care: 6  Current functional level related to goals / functional outcomes: See below   Remaining deficits: See goals.   Education / Equipment: HEP Plan: Patient agrees to discharge.  Patient goals were partially met. Patient is being discharged due to lack of progress.  ?????    Gabriela Eves, PT, DPT   Patient Details  Name: Nancy Thornton MRN: [id number] Date of Birth: 27-Jun-1961 Referring Provider (PT): Ricard Dillon, MD   Encounter Date: 12/26/2018  PT End of Session - 12/26/18 1116    Visit Number  6    Number of Visits  6    Date for PT Re-Evaluation  01/02/19    PT Start Time  1115    PT Stop Time  1206    PT Time Calculation (min)  51 min    Activity Tolerance  Patient tolerated treatment well    Behavior During Therapy  [facility] for tasks assessed/performed       Past Medical History:  Diagnosis Date  . Anal fissure    history of  . Asthma   . Depression   . Diabetes mellitus without complication ([place])   . DM (diabetes mellitus) ([place]) 10/19/2012  . Hypertension   . Lichen    hands and feet  . Obesity   . Substance abuse ([place])    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    Past Surgical History:  Procedure Laterality Date  . BACK SURGERY    . CHOLECYSTECTOMY    . CYSTOSTOMY W/ BLADDER BIOPSY  1990   punctured hole in bladder, had open procedure to repair  . TONSILLECTOMY    . TOTAL VAGINAL HYSTERECTOMY     58 y/o. bleeding. ovaries left in situ    There were no vitals filed for this visit.  Subjective Assessment - 12/26/18 1158    Subjective  COVID-19 screen performed prior to patient entering clinic. Patient arrives with pain in her low back. She is to see the doctor for a the cyst procedure on July 9 and  follow up with neurologist on July 30th.    Pertinent History  HTN, DM, Asthma    Limitations  Sitting;Standing;Walking;House hold activities    How long can you sit comfortably?  30 minutes    How long can you stand comfortably?  15-20 minutes    How long can you walk comfortably?  15-20 minutes    Diagnostic tests  MRI: see media    Patient Stated Goals  Decrease pain and stop tingling down leg    Currently in Pain?  Yes    Pain Score  8     Pain Location  Back    Pain Orientation  Left    Pain Descriptors / Indicators  Shooting    Pain Type  Chronic pain    Pain Onset  More than a month ago    Pain Frequency  Constant         [facility] PT Assessment - 12/26/18 0001      Assessment   Medical Diagnosis  lumbar radiculopathy, Synovial Cyst of lumbar facet joint    Referring Provider (PT)  Ricard Dillon, MD    Next MD Visit  December 19, 2018    Prior Therapy  yes  [facility] Adult PT Treatment/Exercise - 12/26/18 0001      Modalities   Modalities  Electrical Stimulation;Moist Heat;Ultrasound      Moist Heat Therapy   Number Minutes Moist Heat  20 Minutes    Moist Heat Location  Lumbar Spine      Electrical Stimulation   Electrical Stimulation Location  Left low back.    Electrical Stimulation Action  IFC    Electrical Stimulation Parameters  80-150 hz x 15 mins    Electrical Stimulation Goals  Tone;Pain      Ultrasound   Ultrasound Location  left low back    Ultrasound Parameters  combo e-stim/US    Ultrasound Goals  Pain      Manual Therapy   Manual Therapy  Soft tissue mobilization    Soft tissue mobilization  rightSDLY position:  STW/M x 12 minutes to reduce tone.                  PT Long Term Goals - 12/26/18 1157      PT LONG TERM GOAL #1   Title  Patient will be independent with HEP    Time  6    Period  Weeks    Status  Achieved      PT LONG TERM GOAL #2   Title  Patient will report ability to perform ADLS  with low back pain less than 4/10.     Time  6    Period  Weeks    Status  Not Met      PT LONG TERM GOAL #3   Title  Patient will demonstrate 4+/5 or greater left LE MMT to improve stability during functional tasks.    Time  6    Period  Weeks    Status  Partially Met            Plan - 12/26/18 1204    Clinical Impression Statement  Patient arrives to physical therapy with ongoing pain and stated PT only relieved pain temporarily. Patient's goals were partially met. Patient to follow up with neurologist and is anticipating surgical interventions to address the cyst.    Personal Factors and Comorbidities  Comorbidity 3+    Comorbidities  HTN, DM, Asthma    Examination-Activity Limitations  Bend;Sit;Stand    Stability/Clinical Decision Making  Evolving/Moderate complexity    Clinical Decision Making  Low    Rehab Potential  Good    PT Frequency  1x / week    PT Duration  6 weeks    PT Treatment/Interventions  ADLs/Self Care Home Management;Electrical Stimulation;Moist Heat;Cryotherapy;Ultrasound;Therapeutic activities;Therapeutic exercise;Functional mobility training;Stair training;Gait training;Balance training;Neuromuscular re-education;Manual techniques;Dry needling;Passive range of motion;Patient/family education    PT Next Visit Plan  DC    Consulted and Agree with Plan of Care  Patient       Patient will benefit from skilled therapeutic intervention in order to improve the following deficits and impairments:  Pain, Postural dysfunction, Difficulty walking, Decreased strength  Visit Diagnosis: 1. Chronic low back pain, unspecified back pain laterality, unspecified whether sciatica present   2. Muscle weakness (generalized)        Problem List Patient Active Problem List   Diagnosis Date Noted  . Tobacco use 07/10/2018  . Morbid obesity ([place]) 07/10/2018  . 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 Hypertension associated with diabetes ([place]) 10/19/2012  . Type 2 diabetes  mellitus with hyperglycemia, without long-term current use of insulin ([place]) 10/19/2012  . Hyperlipidemia associated with type 2 diabetes mellitus (  [place]) 10/19/2012   Gabriela Eves, PT, DPT 12/26/2018, 12:51 PM  [facility] [place], [place], [address] Phone: [phone]   Fax:  [phone]  Name: Shterna Laramee MRN: [id number] Date of Birth: 07/07/1960

## 2019-01-07 ENCOUNTER — Other Ambulatory Visit: Payer: Self-pay

## 2019-01-08 ENCOUNTER — Ambulatory Visit (INDEPENDENT_AMBULATORY_CARE_PROVIDER_SITE_OTHER): Payer: PRIVATE HEALTH INSURANCE | Admitting: Family Medicine

## 2019-01-08 VITALS — BP 106/63 | HR 57 | Temp 99.2°F | Ht 65.0 in | Wt 218.0 lb

## 2019-01-08 DIAGNOSIS — E1169 Type 2 diabetes mellitus with other specified complication: Secondary | ICD-10-CM

## 2019-01-08 DIAGNOSIS — H938X2 Other specified disorders of left ear: Secondary | ICD-10-CM | POA: Diagnosis not present

## 2019-01-08 DIAGNOSIS — E1165 Type 2 diabetes mellitus with hyperglycemia: Secondary | ICD-10-CM

## 2019-01-08 DIAGNOSIS — J410 Simple chronic bronchitis: Secondary | ICD-10-CM

## 2019-01-08 DIAGNOSIS — E785 Hyperlipidemia, unspecified: Secondary | ICD-10-CM

## 2019-01-08 DIAGNOSIS — E1159 Type 2 diabetes mellitus with other circulatory complications: Secondary | ICD-10-CM | POA: Diagnosis not present

## 2019-01-08 DIAGNOSIS — I10 Essential (primary) hypertension: Secondary | ICD-10-CM

## 2019-01-08 DIAGNOSIS — I152 Hypertension secondary to endocrine disorders: Secondary | ICD-10-CM

## 2019-01-08 DIAGNOSIS — F418 Other specified anxiety disorders: Secondary | ICD-10-CM

## 2019-01-08 DIAGNOSIS — Z720 Tobacco use: Secondary | ICD-10-CM

## 2019-01-08 LAB — BAYER DCA HB A1C WAIVED: HB A1C (BAYER DCA - WAIVED): 7.2 % — ABNORMAL HIGH (ref <7.0)

## 2019-01-08 MED ORDER — LORAZEPAM 0.5 MG PO TABS: ORAL_TABLET | ORAL | 0 refills | Status: DC

## 2019-01-08 MED ORDER — METFORMIN HCL 1000 MG PO TABS: 1000.0000 mg | ORAL_TABLET | Freq: Two times a day (BID) | ORAL | 5 refills | Status: DC

## 2019-01-08 NOTE — Patient Instructions (Addendum)
Schedule your diabetic eye exam.  Your A1c was 7.2 today.  This is a rise from last time.  Your diabetes is now considered UNcontrolled.  I have increased your Metformin dose.  You will still take it twice daily with meals.  I sent in a small quantity of Ativan to have on hand for your procedure.  Take 1 tablet 1 hour before procedure.  May repeat 1 time if needed.  It will make you sleepy.

## 2019-01-08 NOTE — Progress Notes (Signed)
Subjective: CC: f/u DM, fatigue PCP: [name], DO VHQ:IONGEXBM Nancy Thornton is a 58 y.o. female presenting to clinic today for:  1. Type 2 Diabetes w/ HTN, HLD/ chronic back pain/ tobacco use disorder:  Patient reports compliance with metformin 500 mg twice daily, Norvasc 2.5 mg daily, Tenormin 50 mg, Zestoretic 20-25 mg and atorvastatin 10 mg daily.  Last eye exam: Needs Last foot exam: needs Last A1c:  Lab Results  Component Value Date   HGBA1C 6.6 07/10/2018   Nephropathy screen indicated?: on ACE-I Last flu, zoster and/or pneumovax:  Immunization History  Administered Date(s) Administered  . Influenza, High Dose Seasonal PF 03/30/2018  . Influenza, Seasonal, Injecte, Preservative Fre 04/18/2013, 04/12/2014, 03/12/2015, 04/08/2017  . Influenza,inj,Quad PF,6+ Mos 04/18/2013, 03/12/2015, 04/08/2017, 03/30/2018  . Influenza-Unspecified 04/12/2014  . Pneumococcal Conjugate-13 09/05/2013  . Pneumococcal Polysaccharide-23 03/12/2015  . Td 01/09/2017  . Tdap 01/09/2017    Denies unintended weight loss/gain, foot ulcerations, shortness of breath or chest pain.  She reports intermittent use of Symbicort and albuterol.  She notes that she does not like using this unless she "needs to".  She does continue to smoke but is working towards stopping so that she can have the procedure for her back.  She notes quite a bit of anxiety surrounding her upcoming appointment next week for her back injection.  At this time they are deferring any surgical intervention for the cyst noted because of its proximity to the nerves.  She worries that she would not be able to tolerate the injection into her back due to anxiety.  2.  Left ear irritation Patient reports several week history of left-sided ear irritation.  She feels that it looks swollen in the morning and she has muffled hearing.  She goes on to state that she has had issues with it being clogged in the past with cerumen.  Denies any recent  swimming or submerging head underwater.  No drainage.  No fevers or dizziness.   ROS: Per HPI  Allergies  Allergen Reactions  . Penicillins Hives  . Meloxicam Hypertension  . Bactrim [Sulfamethoxazole-Trimethoprim] Rash   Past Medical History:  Diagnosis Date  . Anal fissure    history of  . Asthma   . Depression   . Diabetes mellitus without complication ([place])   . DM (diabetes mellitus) ([place]) 10/19/2012  . Hypertension   . Lichen    hands and feet  . Obesity   . 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  albuterol (PROAIR HFA) 108 (90 Base) MCG/ACT inhaler, Inhale 2 puffs into the lungs every 6 (six) hours as needed for wheezing or shortness of breath., Disp: 3.7 g, Rfl: 6 .  amLODipine (NORVASC) 2.5 MG tablet, Take 1 tablet by mouth once daily, Disp: 90 tablet, Rfl: 3 .  atenolol (TENORMIN) 50 MG tablet, Take 1 tablet (50 mg total) by mouth daily., Disp: 30 tablet, Rfl: 12 .  atorvastatin (LIPITOR) 10 MG tablet, Take 1 tablet by mouth once daily, Disp: 90 tablet, Rfl: 3 .  gabapentin (NEURONTIN) 300 MG capsule, Take 1 capsule (300 mg total) by mouth 2 (two) times daily., Disp: 60 capsule, Rfl: 12 .  lisinopril-hydrochlorothiazide (PRINZIDE,ZESTORETIC) 20-25 MG tablet, Take 1 tablet by mouth daily., Disp: 30 tablet, Rfl: 12 .  Melatonin 1 MG TABS, Take by mouth., Disp: , Rfl:  .  metFORMIN (GLUCOPHAGE) 500 MG tablet, TAKE 1 TABLET BY MOUTH  TWICE DAILY WITH MEALS, Disp: 60 tablet, Rfl: 2 .  budesonide-formoterol (SYMBICORT) 160-4.5 MCG/ACT inhaler, Inhale 2 puffs into the lungs 2 (two) times daily. (Patient not taking: Reported on 01/08/2019), Disp: 1 Inhaler, Rfl: 3 Social History   Socioeconomic History  . Marital status: Divorced    Spouse name: Not on file  . Number of children: 1  . Years of education: Not on file  . Highest education level: Not on file  Occupational History  . 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 Financial resource strain: Not on file  . Food insecurity    Worry: Not on file    Inability: Not on file  . Transportation needs    Medical: Not on file    Non-medical: Not on file  Tobacco Use  . Smoking status: Current Every Day Smoker    Packs/day: 0.50    Types: Cigarettes    Start date: 10/18/1977  . Smokeless tobacco: Never Used  Substance and Sexual Activity  . Alcohol use: No  . Drug use: No  . Sexual activity: Not on file  Lifestyle  . Physical activity    Days per week: Not on file    Minutes per session: Not on file  . Stress: Not on file  Relationships  . Social Musicianconnections    Talks on phone: Not on file    Gets together: Not on file    Attends religious service: Not on file    Active member of club or organization: Not on file    Attends meetings of clubs or organizations: Not on file    Relationship status: Not on file  . Intimate partner violence    Fear of current or ex partner: Not on file    Emotionally abused: Not on file    Physically abused: Not on file    Forced sexual activity: Not on file  Other Topics Concern  . Not on file  Social History Narrative  . Not on file   Family History  Problem Relation Age of Onset  . Diabetes Mother   . Hypertension Father   . Colon cancer Neg Hx   . Esophageal cancer Neg Hx   . Rectal cancer Neg Hx   . Stomach cancer Neg Hx     Objective: Office vital signs reviewed. BP 106/63   Pulse (!) 57   Temp 99.2 F (37.3 C) (Oral)   Ht 5\' 5"  (1.651 m)   Wt 218 lb (98.9 kg)   BMI 36.28 kg/m   Physical Examination:  General: Awake, alert, well nourished, No acute distress HEENT: Normal, sclera white, MMM Cardio: regular rate and rhythm, S1S2 heard, no murmurs appreciated Pulm: clear to auscultation bilaterally, global expiratory wheezes. No rhonchi or rales; normal work of breathing on room air Extremities: warm, well perfused, No edema, cyanosis or clubbing; +2  pulses bilaterally MSK: antalgic gait Neuro: see DM foot exam below Diabetic Foot Exam - Simple   Simple Foot Form Diabetic Foot exam was performed with the following findings: Yes 01/08/2019 12:56 PM  Visual Inspection No deformities, no ulcerations, no other skin breakdown bilaterally: Yes Sensation Testing See comments: Yes Pulse Check Posterior Tibialis and Dorsalis pulse intact bilaterally: Yes Comments Onychomycotic changes noted throughout bilateral feet.  She has decreased vibratory sensation bilaterally.  Monofilament seems to be preserved.  No calluses or ulcerations noted.    Assessment/ Plan: 58 y.o. female   1. Type 2 diabetes mellitus with  hyperglycemia, without long-term current use of insulin (HCC) Now uncontrolled with A1c rising to 7.2.  I have gone ahead and increased her metformin to 1000 mg twice daily.  She has an upcoming appointment for corticosteroid injection to the back from her back doctor and anticipate further rise of her blood sugar.  We discussed this change and need for physical activity to maintain controlled blood sugar. - Bayer DCA Hb A1c Waived - metFORMIN (GLUCOPHAGE) 1000 MG tablet; Take 1 tablet (1,000 mg total) by mouth 2 (two) times daily with a meal.  Dispense: 60 tablet; Refill: 5  2. Hypertension associated with diabetes (HCC) Controlled.  Continue current regimen  3. Hyperlipidemia associated with type 2 diabetes mellitus (HCC) Continue statin  4. Irritation of left ear Appears to be secondary to dry skin and cerumen.  This was irrigated today.  5. Simple chronic bronchitis (HCC) I reinforced need for Symbicort use  6. Tobacco use She is in the action phase of smoking cessation has already contacted 1 800 stop now.  7. Anxiety about health I have given her small quantity of Ativan to use prior to her procedure.  Instructions for use discussed.  She will have somebody driving her to that appointment. The Narcotic Database has been  reviewed.  There were no red flags.   - LORazepam (ATIVAN) 0.5 MG tablet; Take 1 tablet 1 hour before scheduled procedure.  May repeat 1 time if needed.  Dispense: 2 tablet; Refill: 0   Orders Placed This Encounter  Procedures  . Bayer DCA Hb A1c Waived   No orders of the defined types were placed in this encounter.    Raliegh IpAshly M Kylie Simmonds, DO [facility] [phone]

## 2019-02-04 ENCOUNTER — Other Ambulatory Visit: Payer: Self-pay | Admitting: Neurosurgery

## 2019-02-27 NOTE — Progress Notes (Signed)
Cardiology Office Note    Date:  02/28/2019   ID:  Nancy Thornton, DOB 11-Jan-1961, MRN [id number]  PCP:  Raliegh Ip, DO  Cardiologist: Dietrich Pates, MD    F/U of HTN    History of Present Illness:    Nancy Thornton is a 58 y.o. female with past medical history of HTN, HLD, and Type II DM who presents to the office today for 75-month follow-up.  I saw the pt in June 2019 for CP  Pain would typically last for 20 to 30 minutes and was worse with deep breathing. She reported being very active at baseline and denied any recent symptoms with this. At the time of her visit, her chest pain was overall thought to be pleuritic in etiology and no further cardiac testing was pursued at that time. Due to having atherosclerotic plaque along the aorta by prior CT imaging, she was started on Lipitor 10 mg daily. BP was also elevated at 154/78 during her visit, therefore Amlodipine 2.5 mg daily was added to her regimen.  The pt was seen by B Stader in the interval  The patient comes backtoday for follow   She denies CP   Breathing is OK QUit tobacco 3 wks ago   Was hoping to come off some of her BP meds   She misses smolking   Stopped becasuse she will soon have back surgery and neurosurg told her to quit  One episode of dizzienss with standing    Past Medical History:  Diagnosis Date  . Anal fissure    history of  . Asthma   . Depression   . Diabetes mellitus without complication (HCC)   . DM (diabetes mellitus) (HCC) 10/19/2012  . Hypertension   . Lichen    hands and feet  . Obesity   . 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 BACK SURGERY    . CHOLECYSTECTOMY    . CYSTOSTOMY W/ BLADDER BIOPSY  1990   punctured hole in bladder, had open procedure to repair  . TONSILLECTOMY    . TOTAL VAGINAL HYSTERECTOMY     58 y/o. bleeding. ovaries left in situ    Current Medications: Outpatient Medications  Prior to Visit  Medication Sig Dispense Refill  . acetaminophen (TYLENOL) 500 MG tablet Take 1,000 mg by mouth every 6 (six) hours as needed (pain.).    Marland Kitchen albuterol (PROAIR HFA) 108 (90 Base) MCG/ACT inhaler Inhale 2 puffs into the lungs every 6 (six) hours as needed for wheezing or shortness of breath. 3.7 g 6  . amLODipine (NORVASC) 2.5 MG tablet Take 1 tablet by mouth once daily 90 tablet 3  . atenolol (TENORMIN) 50 MG tablet Take 1 tablet (50 mg total) by mouth daily. 30 tablet 12  . atorvastatin (LIPITOR) 10 MG tablet Take 1 tablet by mouth once daily (Patient taking differently: Take 10 mg by mouth daily. ) 90 tablet 3  . budesonide-formoterol (SYMBICORT) 160-4.5 MCG/ACT inhaler Inhale 2 puffs into the lungs 2 (two) times daily. 1 Inhaler 3  . gabapentin (NEURONTIN) 300 MG capsule Take 1 capsule (300 mg total) by mouth 2 (two) times daily. (Patient taking differently: Take 300 mg by mouth 3 (three) times daily as needed (pain.). ) 60 capsule 12  . ibuprofen (ADVIL) 200 MG tablet Take 600 mg by mouth at bedtime.    Marland Kitchen lisinopril-hydrochlorothiazide (PRINZIDE,ZESTORETIC) 20-25 MG tablet  Take 1 tablet by mouth daily. 30 tablet 12  . LORazepam (ATIVAN) 0.5 MG tablet Take 1 tablet 1 hour before scheduled procedure.  May repeat 1 time if needed. 2 tablet 0  . Melatonin 5 MG TABS Take 5 mg by mouth at bedtime.    . metFORMIN (GLUCOPHAGE) 1000 MG tablet Take 1 tablet (1,000 mg total) by mouth 2 (two) times daily with a meal. 60 tablet 5   No facility-administered medications prior to visit.      Allergies:   Penicillins, Meloxicam, and Bactrim [sulfamethoxazole-trimethoprim]   Social History   Socioeconomic History  . Marital status: Divorced    Spouse name: Not on file  . Number of children: 1  . Years of education: Not on file  . Highest education level: Not on file  Occupational History  . 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 Financial resource strain:  Not on file  . Food insecurity    Worry: Not on file    Inability: Not on file  . Transportation needs    Medical: Not on file    Non-medical: Not on file  Tobacco Use  . Smoking status: Former Smoker    Packs/day: 0.50    Types: Cigarettes    Start date: 10/18/1977    Quit date: 02/10/2019    Years since quitting: 0.0  . Smokeless tobacco: Never Used  Substance and Sexual Activity  . Alcohol use: No  . Drug use: No  . Sexual activity: Not on file  Lifestyle  . Physical activity    Days per week: Not on file    Minutes per session: Not on file  . Stress: Not on file  Relationships  . Social Herbalist on phone: Not on file    Gets together: Not on file    Attends religious service: Not on file    Active member of club or organization: Not on file    Attends meetings of clubs or organizations: Not on file    Relationship status: Not on file  Other Topics Concern  . Not on file  Social History Narrative  . Not on file     Family History:  The patient's family history includes Diabetes in her mother; Hypertension in her father.   Review of Systems:   Please see the history of present illness.     General:  No chills, fever, night sweats or weight changes.  Cardiovascular:  No chest pain, dyspnea on exertion, edema, orthopnea, palpitations, paroxysmal nocturnal dyspnea. Dermatological: No rash, lesions/masses Respiratory: No cough, dyspnea Urologic: No hematuria, dysuria Abdominal:   No nausea, vomiting, diarrhea, bright red blood per rectum, melena, or hematemesis Neurologic:  No visual changes, wkns, changes in mental status. Positive for sciatic pain.   All other systems reviewed and are otherwise negative except as noted above.   Physical Exam:    VS:  BP 136/76 (BP Location: Left Arm)   Pulse (!) 58   Temp 97.9 F (36.6 C)   Ht 5' 5.5" (1.664 m)   Wt 223 lb (101.2 kg)   SpO2 95%   BMI 36.54 kg/m    General: Morbidly obese 58 yo female appearing  in no acute distress. Head: Normocephalic, atraumatic, sclera non-icteric, no xanthomas, nares are without discharge.  Neck: No carotid bruits. JVP normal  .  Lungs: Respirations regular and unlabored, without wheezes or rales. Mild rhonchi Heart: Regular rate and rhythm. No S3 or  S4.  No murmur, no rubs, or gallops appreciated. Abdomen: Soft, non-tender, non-distended with normoactive bowel sounds. No hepatomegaly. No rebound/guarding. No obvious abdominal masses. Msk:  Strength and tone appear normal for age. No joint deformities or effusions. Extremities: No clubbing or cyanosis. No lower extremity edema.  Distal pedal pulses are 2+ bilaterally. Neuro: Alert and oriented X 3. Moves all extremities spontaneously. No focal deficits noted. Psych:  Responds to questions appropriately with a normal affect. Skin: No rashes or lesions noted  Wt Readings from Last 3 Encounters:  02/28/19 223 lb (101.2 kg)  01/08/19 218 lb (98.9 kg)  10/04/18 219 lb (99.3 kg)     Studies/Labs Reviewed:   EKG:  EKG is not ordered today.   Recent Labs: 07/10/2018: BUN 19; Creatinine, Ser 0.74; Potassium 4.2; Sodium 142; TSH 2.630   Lipid Panel    Component Value Date/Time   CHOL 216 (H) 11/21/2014 1619   CHOL 196 01/17/2013 1226   TRIG 183 (H) 11/21/2014 1619   TRIG 233 (H) 09/05/2013 1142   TRIG 176 (H) 01/17/2013 1226   HDL 69 11/21/2014 1619   HDL 58 09/05/2013 1142   HDL 57 01/17/2013 1226   CHOLHDL 3.1 11/21/2014 1619   LDLCALC 110 (H) 11/21/2014 1619   LDLCALC 58 09/05/2013 1142   LDLCALC 104 (H) 01/17/2013 1226   LDLDIRECT 54 02/16/2018 0903    Additional studies/ records that were reviewed today include:    Plan:     1. HTN BP is OK   She had one dizzy spell     I would like her to check at home   Note her HR is a little low   She may feel better on a little more amldoipine and less atenolol   I would not switch for now being that she is so close to surgery    Willl have televisit  in October  2. Chest Pain Pt denies CP       3. Aortic Atherosclerosis - Noted on prior CT imaging in 05/2016. She is currently on statin therapy. See discussion below.   4. HLD  Need to get fasting      5. Tobacco Use -Congratulated on quitting   Told her not to start back    Medication Adjustments/Labs and Tests Ordered: Current medicines are reviewed at length with the patient today.  Concerns regarding medicines are outlined above.  Medication changes, Labs and Tests ordered today are listed in the Patient Instructions below. Patient Instructions  Your physician wants you to follow-up in:1 year with Dr.Sindy Mccune  with  You will receive a reminder letter in the mail two months in advance. If you don't receive a letter, please call our office to schedule the follow-up appointment.  Your physician recommends that you continue on your current medications as directed. Please refer to the Current Medication list given to you today.  If you need a refill on your cardiac medications before your next appointment, please call your pharmacy.  No labs or tests today  Thank you for choosing [facility] !        Signed, Dietrich PatesPaula Arnetra Terris, MD  02/28/2019 2:05 PM    [facility] [address] [place], [place] [address] Phone: [phone]

## 2019-02-28 ENCOUNTER — Ambulatory Visit (INDEPENDENT_AMBULATORY_CARE_PROVIDER_SITE_OTHER): Payer: PRIVATE HEALTH INSURANCE | Admitting: Internal Medicine

## 2019-02-28 ENCOUNTER — Encounter: Payer: Self-pay | Admitting: Internal Medicine

## 2019-02-28 ENCOUNTER — Other Ambulatory Visit: Payer: Self-pay

## 2019-02-28 VITALS — BP 136/76 | HR 58 | Temp 97.9°F | Ht 65.5 in | Wt 223.0 lb

## 2019-02-28 DIAGNOSIS — I10 Essential (primary) hypertension: Secondary | ICD-10-CM | POA: Diagnosis not present

## 2019-02-28 NOTE — Patient Instructions (Signed)
Medication Instructions:  Your physician recommends that you continue on your current medications as directed. Please refer to the Current Medication list given to you today.  If you need a refill on your cardiac medications before your next appointment, please call your pharmacy.   Lab work: NONE  If you have labs (blood work) drawn today and your tests are completely normal, you will receive your results only by: . MyChart Message (if you have MyChart) OR . A paper copy in the mail If you have any lab test that is abnormal or we need to change your treatment, we will call you to review the results.  Testing/Procedures: NONE   Follow-Up: At [facility], you and your health needs are our priority.  As part of our continuing mission to provide you with exceptional heart care, we have created designated Provider Care Teams.  These Care Teams include your primary Cardiologist (physician) and Advanced Practice Providers (APPs -  Physician Assistants and Nurse Practitioners) who all work together to provide you with the care you need, when you need it. You will need a follow up appointment in 2 months.  Please call our office 2 months in advance to schedule this appointment.  You may see Paula Ross, MD or one of the following Advanced Practice Providers on your designated Care Team:   Brittany Strader, PA-C ([place] Office) . Michele Lenze, PA-C ([place] Office)  Any Other Special Instructions Will Be Listed Below (If Applicable). Thank you for choosing  [facility]!     

## 2019-03-01 ENCOUNTER — Encounter
Admission: RE | Admit: 2019-03-01 | Discharge: 2019-03-01 | Disposition: A | Discharge status: Home / Self Care | Payer: PRIVATE HEALTH INSURANCE | Source: Ambulatory Visit | Attending: Neurosurgery | Admitting: Neurosurgery

## 2019-03-01 DIAGNOSIS — Z20828 Contact with and (suspected) exposure to other viral communicable diseases: Secondary | ICD-10-CM | POA: Diagnosis not present

## 2019-03-01 DIAGNOSIS — M4310 Spondylolisthesis, site unspecified: Secondary | ICD-10-CM | POA: Diagnosis not present

## 2019-03-01 DIAGNOSIS — M7138 Other bursal cyst, other site: Secondary | ICD-10-CM | POA: Diagnosis not present

## 2019-03-01 DIAGNOSIS — Z01812 Encounter for preprocedural laboratory examination: Secondary | ICD-10-CM | POA: Insufficient documentation

## 2019-03-01 LAB — BASIC METABOLIC PANEL
Anion gap: 11 (ref 5–15)
BUN: 18 mg/dL (ref 6–20)
CO2: 29 mmol/L (ref 22–32)
Calcium: 9.3 mg/dL (ref 8.9–10.3)
Chloride: 100 mmol/L (ref 98–111)
Creatinine, Ser: 0.67 mg/dL (ref 0.44–1.00)
GFR calc Af Amer: >60 mL/min (ref >60)
GFR calc non Af Amer: >60 mL/min (ref >60)
Glucose, Bld: 152 mg/dL — ABNORMAL HIGH (ref 70–99)
Potassium: 4.3 mmol/L (ref 3.5–5.1)
Sodium: 140 mmol/L (ref 135–145)

## 2019-03-01 LAB — CBC
HCT: 39.6 % (ref 36.0–46.0)
Hemoglobin: 14.1 g/dL (ref 12.0–15.0)
MCH: 31.8 pg (ref 26.0–34.0)
MCHC: 35.6 g/dL (ref 30.0–36.0)
MCV: 89.4 fL (ref 80.0–100.0)
Platelets: 250 10*3/uL (ref 150–400)
RBC: 4.43 MIL/uL (ref 3.87–5.11)
RDW: 11.5 % (ref 11.5–15.5)
WBC: 8.3 10*3/uL (ref 4.0–10.5)
nRBC: 0 % (ref 0.0–0.2)

## 2019-03-01 LAB — SARS CORONAVIRUS 2 (TAT 6-24 HRS): SARS Coronavirus 2: NEGATIVE

## 2019-03-01 LAB — PROTIME-INR
INR: 0.9 (ref 0.8–1.2)
Prothrombin Time: 11.8 seconds (ref 11.4–15.2)

## 2019-03-01 LAB — TYPE AND SCREEN
ABO/RH(D): B POS
Antibody Screen: NEGATIVE

## 2019-03-01 LAB — URINALYSIS, ROUTINE W REFLEX MICROSCOPIC
Bilirubin Urine: NEGATIVE
Glucose, UA: 50 mg/dL — AB
Hgb urine dipstick: NEGATIVE
Ketones, ur: 5 mg/dL — AB
Leukocytes,Ua: NEGATIVE
Nitrite: NEGATIVE
Protein, ur: NEGATIVE mg/dL
Specific Gravity, Urine: 1.021 (ref 1.005–1.030)
pH: 5 (ref 5.0–8.0)

## 2019-03-01 LAB — SURGICAL PCR SCREEN
MRSA, PCR: NEGATIVE
Staphylococcus aureus: NEGATIVE

## 2019-03-01 LAB — APTT: aPTT: 25 seconds (ref 24–36)

## 2019-03-01 NOTE — Patient Instructions (Addendum)
Your procedure is scheduled on: Wednesday 03/06/19.  Report to DAY SURGERY DEPARTMENT LOCATED ON 2ND FLOOR MEDICAL MALL ENTRANCE. To find out your arrival time please call [phone] between 1PM - 3PM on Tuesday 03/05/19.   Remember: Instructions that are not followed completely may result in serious medical risk, up to and including death, or upon the discretion of your surgeon and anesthesiologist your surgery may need to be rescheduled.      _X__ 1. Do not eat food after midnight the night before your procedure.                 No gum chewing or hard candies. You may drink Water up to 2 hours                 before you are scheduled to arrive for your surgery- DO NOT water                 2 hours of the start of your surgery.                   __X__2.  On the morning of surgery brush your teeth with toothpaste and water, you may rinse your mouth with mouthwash if you wish.  Do not swallow any toothpaste or mouthwash.       _X__ 3.  No Alcohol for 24 hours before or after surgery.     _X__ 4.  Do Not Smoke or use e-cigarettes For 24 Hours Prior to Your Surgery.                 Do not use any chewable tobacco products for at least 6 hours prior to                 surgery.    __X__6.  Notify your doctor if there is any change in your medical condition      (cold, fever, infections).      Do not wear jewelry, make-up, hairpins, clips or nail polish. Do not wear lotions, powders, or perfumes.  Do not shave 48 hours prior to surgery. Men may shave face and neck. Do not bring valuables to the hospital.     [facility] is not responsible for any belongings or valuables.   Contacts, dentures/partials or body piercings may not be worn into surgery. Bring a case for your contacts, glasses or hearing aids, a denture cup will be supplied.   For patients admitted to the hospital, discharge time is determined by your treatment team.       Please read over the following fact  sheets that you were given:   MRSA Information   __X__ Take these medicines the morning of surgery with A SIP OF WATER:     1. albuterol (PROAIR HFA) 108 (90 Base) MCG/ACT inhaler  2. amLODipine (NORVASC) 2.5 MG tablet  3. atenolol (TENORMIN) 50 MG tablet  4. budesonide-formoterol (SYMBICORT) 160-4.5 MCG/ACT inhaler  5. acetaminophen (TYLENOL) 500 MG tablet if needed     __X__ Use CHG Soap as directed    _ X__ Use inhalers on the day of surgery.     __X__ Stop metformin/Janumet/Farxiga 2 days prior to surgery. Your last dose will be on  Sunday 03/03/19.    __X__ Stop Anti-inflammatories 7 days before surgery such as Advil, Ibuprofen, Motrin, BC or Goodies Powder, Naprosyn, Naproxen, Aleve, Aspirin, Meloxicam. May take Tylenol if needed for pain or discomfort.    __X__ Don't begin taking any  new herbal supplements before your surgery.

## 2019-03-06 ENCOUNTER — Encounter
Admission: RE | Disposition: A | Discharge status: Home / Self Care | Payer: Self-pay | Source: Home / Self Care | Attending: Neurosurgery

## 2019-03-06 ENCOUNTER — Other Ambulatory Visit: Payer: Self-pay

## 2019-03-06 ENCOUNTER — Inpatient Hospital Stay: Payer: PRIVATE HEALTH INSURANCE | Admitting: Certified Registered Nurse Anesthetist

## 2019-03-06 ENCOUNTER — Inpatient Hospital Stay: Payer: PRIVATE HEALTH INSURANCE

## 2019-03-06 ENCOUNTER — Encounter: Payer: Self-pay | Admitting: *Deleted

## 2019-03-06 ENCOUNTER — Inpatient Hospital Stay
Admission: RE | Admit: 2019-03-06 | Discharge: 2019-03-08 | DRG: 460 | Disposition: A | Discharge status: Home / Self Care | Payer: PRIVATE HEALTH INSURANCE | Attending: Neurosurgery | Admitting: Neurosurgery

## 2019-03-06 DIAGNOSIS — M5416 Radiculopathy, lumbar region: Secondary | ICD-10-CM | POA: Diagnosis present

## 2019-03-06 DIAGNOSIS — Z419 Encounter for procedure for purposes other than remedying health state, unspecified: Secondary | ICD-10-CM

## 2019-03-06 DIAGNOSIS — I10 Essential (primary) hypertension: Secondary | ICD-10-CM | POA: Diagnosis present

## 2019-03-06 DIAGNOSIS — M7138 Other bursal cyst, other site: Secondary | ICD-10-CM | POA: Diagnosis present

## 2019-03-06 DIAGNOSIS — Z7984 Long term (current) use of oral hypoglycemic drugs: Secondary | ICD-10-CM | POA: Diagnosis not present

## 2019-03-06 DIAGNOSIS — Z87891 Personal history of nicotine dependence: Secondary | ICD-10-CM | POA: Diagnosis not present

## 2019-03-06 DIAGNOSIS — J45909 Unspecified asthma, uncomplicated: Secondary | ICD-10-CM | POA: Diagnosis present

## 2019-03-06 DIAGNOSIS — E7800 Pure hypercholesterolemia, unspecified: Secondary | ICD-10-CM | POA: Diagnosis present

## 2019-03-06 DIAGNOSIS — M4316 Spondylolisthesis, lumbar region: Principal | ICD-10-CM | POA: Diagnosis present

## 2019-03-06 DIAGNOSIS — Z90710 Acquired absence of both cervix and uterus: Secondary | ICD-10-CM | POA: Diagnosis not present

## 2019-03-06 DIAGNOSIS — Z20828 Contact with and (suspected) exposure to other viral communicable diseases: Secondary | ICD-10-CM | POA: Diagnosis present

## 2019-03-06 DIAGNOSIS — Z981 Arthrodesis status: Secondary | ICD-10-CM

## 2019-03-06 DIAGNOSIS — E119 Type 2 diabetes mellitus without complications: Secondary | ICD-10-CM | POA: Diagnosis present

## 2019-03-06 DIAGNOSIS — M545 Low back pain: Secondary | ICD-10-CM | POA: Diagnosis present

## 2019-03-06 HISTORY — PX: ANTERIOR LUMBAR FUSION: SHX1170

## 2019-03-06 LAB — URINE DRUG SCREEN, QUALITATIVE (ARMC ONLY)
Amphetamines, Ur Screen: NOT DETECTED
Barbiturates, Ur Screen: NOT DETECTED
Benzodiazepine, Ur Scrn: NOT DETECTED
Cannabinoid 50 Ng, Ur ~~LOC~~: NOT DETECTED
Cocaine Metabolite,Ur ~~LOC~~: NOT DETECTED
MDMA (Ecstasy)Ur Screen: NOT DETECTED
Methadone Scn, Ur: NOT DETECTED
Opiate, Ur Screen: NOT DETECTED
Phencyclidine (PCP) Ur S: NOT DETECTED
Tricyclic, Ur Screen: NOT DETECTED

## 2019-03-06 LAB — GLUCOSE, CAPILLARY
Glucose-Capillary: 154 mg/dL — ABNORMAL HIGH (ref 70–99)
Glucose-Capillary: 166 mg/dL — ABNORMAL HIGH (ref 70–99)
Glucose-Capillary: 200 mg/dL — ABNORMAL HIGH (ref 70–99)

## 2019-03-06 LAB — ABO/RH: ABO/RH(D): B POS

## 2019-03-06 SURGERY — ANTERIOR LUMBAR FUSION 1 LEVEL
Anesthesia: General | Site: Back

## 2019-03-06 MED ORDER — ONDANSETRON HCL 4 MG/2ML IJ SOLN
INTRAMUSCULAR | Status: DC | PRN
  Administered 2019-03-06: 4 mg via INTRAVENOUS

## 2019-03-06 MED ORDER — GABAPENTIN 300 MG PO CAPS
300.0000 mg | ORAL_CAPSULE | Freq: Three times a day (TID) | ORAL | Status: DC | PRN
  Administered 2019-03-07: 300 mg via ORAL
  Filled 2019-03-06: qty 1

## 2019-03-06 MED ORDER — SUCCINYLCHOLINE CHLORIDE 20 MG/ML IJ SOLN
INTRAMUSCULAR | Status: DC | PRN
  Administered 2019-03-06: 160 mg via INTRAVENOUS

## 2019-03-06 MED ORDER — FENTANYL CITRATE (PF) 100 MCG/2ML IJ SOLN
INTRAMUSCULAR | Status: DC | PRN
  Administered 2019-03-06: 100 ug via INTRAVENOUS
  Administered 2019-03-06: 25 ug via INTRAVENOUS
  Administered 2019-03-06: 50 ug via INTRAVENOUS
  Administered 2019-03-06: 25 ug via INTRAVENOUS

## 2019-03-06 MED ORDER — FENTANYL CITRATE (PF) 100 MCG/2ML IJ SOLN
INTRAMUSCULAR | Status: AC
  Filled 2019-03-06: qty 2

## 2019-03-06 MED ORDER — PHENYLEPHRINE HCL (PRESSORS) 10 MG/ML IV SOLN
INTRAVENOUS | Status: DC | PRN
  Administered 2019-03-06: 100 ug via INTRAVENOUS

## 2019-03-06 MED ORDER — LACTATED RINGERS IV SOLN: INTRAVENOUS | Status: DC | PRN

## 2019-03-06 MED ORDER — PROPOFOL 10 MG/ML IV BOLUS
INTRAVENOUS | Status: DC | PRN
  Administered 2019-03-06: 200 mg via INTRAVENOUS

## 2019-03-06 MED ORDER — METFORMIN HCL 500 MG PO TABS
1000.0000 mg | ORAL_TABLET | Freq: Two times a day (BID) | ORAL | Status: DC
  Administered 2019-03-07 – 2019-03-08 (×3): 1000 mg via ORAL
  Filled 2019-03-06 (×3): qty 2

## 2019-03-06 MED ORDER — POLYETHYLENE GLYCOL 3350 17 G PO PACK: 17.0000 g | PACK | Freq: Every day | ORAL | Status: DC | PRN

## 2019-03-06 MED ORDER — INSULIN ASPART 100 UNIT/ML ~~LOC~~ SOLN
0.0000 [IU] | Freq: Three times a day (TID) | SUBCUTANEOUS | Status: DC
  Administered 2019-03-07 (×3): 5 [IU] via SUBCUTANEOUS
  Filled 2019-03-06 (×4): qty 1

## 2019-03-06 MED ORDER — BISACODYL 5 MG PO TBEC: 5.0000 mg | DELAYED_RELEASE_TABLET | Freq: Every day | ORAL | Status: DC | PRN

## 2019-03-06 MED ORDER — LISINOPRIL-HYDROCHLOROTHIAZIDE 20-25 MG PO TABS: 1.0000 | ORAL_TABLET | Freq: Every day | ORAL | Status: DC

## 2019-03-06 MED ORDER — DIPHENHYDRAMINE HCL 50 MG/ML IJ SOLN
INTRAMUSCULAR | Status: AC
  Administered 2019-03-06: 25 mg via INTRAVENOUS
  Filled 2019-03-06: qty 1

## 2019-03-06 MED ORDER — REMIFENTANIL HCL 1 MG IV SOLR
INTRAVENOUS | Status: AC
  Filled 2019-03-06: qty 1000

## 2019-03-06 MED ORDER — SODIUM CHLORIDE 0.9 % IV SOLN
INTRAVENOUS | Status: DC | PRN
  Administered 2019-03-06: 50 ug/min via INTRAVENOUS

## 2019-03-06 MED ORDER — ACETAMINOPHEN 10 MG/ML IV SOLN
INTRAVENOUS | Status: DC | PRN
  Administered 2019-03-06: 1000 mg via INTRAVENOUS

## 2019-03-06 MED ORDER — ROCURONIUM BROMIDE 100 MG/10ML IV SOLN
INTRAVENOUS | Status: DC | PRN
  Administered 2019-03-06: 10 mg via INTRAVENOUS

## 2019-03-06 MED ORDER — HYDROMORPHONE HCL 1 MG/ML IJ SOLN
INTRAMUSCULAR | Status: AC
  Filled 2019-03-06: qty 1

## 2019-03-06 MED ORDER — PHENOL 1.4 % MT LIQD
1.0000 | OROMUCOSAL | Status: DC | PRN
  Filled 2019-03-06: qty 177

## 2019-03-06 MED ORDER — BUPIVACAINE HCL (PF) 0.5 % IJ SOLN
INTRAMUSCULAR | Status: DC | PRN
  Administered 2019-03-06: 20 mL

## 2019-03-06 MED ORDER — KETOROLAC TROMETHAMINE 30 MG/ML IJ SOLN
INTRAMUSCULAR | Status: AC
  Filled 2019-03-06: qty 1

## 2019-03-06 MED ORDER — MAGNESIUM CITRATE PO SOLN
1.0000 | Freq: Once | ORAL | Status: DC | PRN
  Filled 2019-03-06: qty 296

## 2019-03-06 MED ORDER — MOMETASONE FURO-FORMOTEROL FUM 200-5 MCG/ACT IN AERO
2.0000 | INHALATION_SPRAY | Freq: Two times a day (BID) | RESPIRATORY_TRACT | Status: DC
  Administered 2019-03-07 – 2019-03-08 (×2): 2 via RESPIRATORY_TRACT
  Filled 2019-03-06: qty 8.8

## 2019-03-06 MED ORDER — SEVOFLURANE IN SOLN
RESPIRATORY_TRACT | Status: AC
  Filled 2019-03-06: qty 250

## 2019-03-06 MED ORDER — THROMBIN 5000 UNITS EX SOLR
CUTANEOUS | Status: AC
  Filled 2019-03-06: qty 5000

## 2019-03-06 MED ORDER — METHOCARBAMOL 500 MG PO TABS
750.0000 mg | ORAL_TABLET | Freq: Four times a day (QID) | ORAL | Status: DC
  Administered 2019-03-07 – 2019-03-08 (×6): 750 mg via ORAL
  Filled 2019-03-06: qty 1
  Filled 2019-03-06 (×7): qty 2

## 2019-03-06 MED ORDER — KETAMINE HCL 50 MG/ML IJ SOLN
INTRAMUSCULAR | Status: DC | PRN
  Administered 2019-03-06: 50 mg via INTRAMUSCULAR

## 2019-03-06 MED ORDER — FENTANYL CITRATE (PF) 100 MCG/2ML IJ SOLN
25.0000 ug | INTRAMUSCULAR | Status: AC | PRN
  Administered 2019-03-06 (×6): 25 ug via INTRAVENOUS

## 2019-03-06 MED ORDER — LACTATED RINGERS IV SOLN: INTRAVENOUS | Status: DC

## 2019-03-06 MED ORDER — MIDAZOLAM HCL 2 MG/2ML IJ SOLN
INTRAMUSCULAR | Status: DC | PRN
  Administered 2019-03-06: 2 mg via INTRAVENOUS

## 2019-03-06 MED ORDER — LIDOCAINE HCL (CARDIAC) PF 100 MG/5ML IV SOSY
PREFILLED_SYRINGE | INTRAVENOUS | Status: DC | PRN
  Administered 2019-03-06: 100 mg via INTRAVENOUS

## 2019-03-06 MED ORDER — AMLODIPINE BESYLATE 5 MG PO TABS
2.5000 mg | ORAL_TABLET | Freq: Every day | ORAL | Status: DC
  Administered 2019-03-07 – 2019-03-08 (×2): 2.5 mg via ORAL
  Filled 2019-03-06 (×2): qty 1

## 2019-03-06 MED ORDER — BUPIVACAINE LIPOSOME 1.3 % IJ SUSP
INTRAMUSCULAR | Status: AC
  Filled 2019-03-06: qty 20

## 2019-03-06 MED ORDER — OXYCODONE HCL 5 MG PO TABS
5.0000 mg | ORAL_TABLET | ORAL | Status: DC | PRN
  Administered 2019-03-06 – 2019-03-08 (×3): 5 mg via ORAL
  Filled 2019-03-06 (×2): qty 1

## 2019-03-06 MED ORDER — SODIUM CHLORIDE 0.9 % IV SOLN
INTRAVENOUS | Status: DC
  Administered 2019-03-06 (×2): via INTRAVENOUS

## 2019-03-06 MED ORDER — SODIUM CHLORIDE 0.9 % IV SOLN: INTRAVENOUS | Status: DC | PRN

## 2019-03-06 MED ORDER — BUPIVACAINE-EPINEPHRINE (PF) 0.5% -1:200000 IJ SOLN
INTRAMUSCULAR | Status: AC
  Filled 2019-03-06: qty 30

## 2019-03-06 MED ORDER — MENTHOL 3 MG MT LOZG
1.0000 | LOZENGE | OROMUCOSAL | Status: DC | PRN
  Filled 2019-03-06: qty 9

## 2019-03-06 MED ORDER — LACTATED RINGERS IV SOLN
INTRAVENOUS | Status: DC | PRN
  Administered 2019-03-06: 16:00:00 via INTRAVENOUS

## 2019-03-06 MED ORDER — DEXAMETHASONE SODIUM PHOSPHATE 10 MG/ML IJ SOLN
INTRAMUSCULAR | Status: DC | PRN
  Administered 2019-03-06: 10 mg via INTRAVENOUS

## 2019-03-06 MED ORDER — SODIUM CHLORIDE 0.9 % IV SOLN: 250.0000 mL | INTRAVENOUS | Status: DC

## 2019-03-06 MED ORDER — KETOROLAC TROMETHAMINE 30 MG/ML IJ SOLN
30.0000 mg | Freq: Once | INTRAMUSCULAR | Status: AC
  Administered 2019-03-06: 30 mg via INTRAVENOUS
  Filled 2019-03-06: qty 1

## 2019-03-06 MED ORDER — GLYCOPYRROLATE 0.2 MG/ML IJ SOLN
INTRAMUSCULAR | Status: DC | PRN
  Administered 2019-03-06: 0.2 mg via INTRAVENOUS

## 2019-03-06 MED ORDER — HYDROMORPHONE HCL 1 MG/ML IJ SOLN
0.5000 mg | INTRAMUSCULAR | Status: DC | PRN
  Administered 2019-03-06: 0.5 mg via INTRAVENOUS
  Filled 2019-03-06: qty 1

## 2019-03-06 MED ORDER — THROMBIN 5000 UNITS EX SOLR
CUTANEOUS | Status: DC | PRN
  Administered 2019-03-06: 5000 [IU] via TOPICAL

## 2019-03-06 MED ORDER — ACETAMINOPHEN 325 MG PO TABS: 650.0000 mg | ORAL_TABLET | ORAL | Status: DC | PRN

## 2019-03-06 MED ORDER — BUPIVACAINE-EPINEPHRINE 0.5% -1:200000 IJ SOLN
INTRAMUSCULAR | Status: DC | PRN
  Administered 2019-03-06: 10 mL

## 2019-03-06 MED ORDER — LISINOPRIL 20 MG PO TABS
20.0000 mg | ORAL_TABLET | Freq: Every day | ORAL | Status: DC
  Administered 2019-03-07 – 2019-03-08 (×2): 20 mg via ORAL
  Filled 2019-03-06 (×2): qty 1

## 2019-03-06 MED ORDER — SENNA 8.6 MG PO TABS
1.0000 | ORAL_TABLET | Freq: Two times a day (BID) | ORAL | Status: DC
  Administered 2019-03-06 – 2019-03-08 (×4): 8.6 mg via ORAL
  Filled 2019-03-06 (×4): qty 1

## 2019-03-06 MED ORDER — ATORVASTATIN CALCIUM 10 MG PO TABS
10.0000 mg | ORAL_TABLET | Freq: Every day | ORAL | Status: DC
  Administered 2019-03-06 – 2019-03-08 (×3): 10 mg via ORAL
  Filled 2019-03-06 (×3): qty 1

## 2019-03-06 MED ORDER — KETOROLAC TROMETHAMINE 15 MG/ML IJ SOLN
15.0000 mg | Freq: Four times a day (QID) | INTRAMUSCULAR | Status: DC
  Administered 2019-03-07 (×2): 15 mg via INTRAVENOUS
  Filled 2019-03-06 (×3): qty 1

## 2019-03-06 MED ORDER — METHOCARBAMOL 1000 MG/10ML IJ SOLN
500.0000 mg | Freq: Four times a day (QID) | INTRAVENOUS | Status: DC
  Administered 2019-03-06: 500 mg via INTRAVENOUS
  Filled 2019-03-06 (×8): qty 5

## 2019-03-06 MED ORDER — FENTANYL CITRATE (PF) 100 MCG/2ML IJ SOLN
25.0000 ug | INTRAMUSCULAR | Status: AC | PRN
  Administered 2019-03-06 (×2): 25 ug via INTRAVENOUS

## 2019-03-06 MED ORDER — ALBUTEROL SULFATE (2.5 MG/3ML) 0.083% IN NEBU: 2.5000 mg | INHALATION_SOLUTION | Freq: Four times a day (QID) | RESPIRATORY_TRACT | Status: DC | PRN

## 2019-03-06 MED ORDER — SODIUM CHLORIDE 0.9% FLUSH
3.0000 mL | Freq: Two times a day (BID) | INTRAVENOUS | Status: DC
  Administered 2019-03-07: 3 mL via INTRAVENOUS

## 2019-03-06 MED ORDER — BUPIVACAINE HCL (PF) 0.5 % IJ SOLN
INTRAMUSCULAR | Status: AC
  Filled 2019-03-06: qty 30

## 2019-03-06 MED ORDER — SODIUM CHLORIDE (PF) 0.9 % IJ SOLN
INTRAMUSCULAR | Status: AC
  Filled 2019-03-06: qty 50

## 2019-03-06 MED ORDER — SODIUM CHLORIDE FLUSH 0.9 % IV SOLN
INTRAVENOUS | Status: AC
  Filled 2019-03-06: qty 10

## 2019-03-06 MED ORDER — SODIUM CHLORIDE 0.9 % IR SOLN
Status: DC | PRN
  Administered 2019-03-06: 15:00:00 1000 mL

## 2019-03-06 MED ORDER — OXYCODONE HCL 5 MG PO TABS
10.0000 mg | ORAL_TABLET | ORAL | Status: DC | PRN
  Administered 2019-03-07 – 2019-03-08 (×4): 10 mg via ORAL
  Filled 2019-03-06 (×5): qty 2

## 2019-03-06 MED ORDER — ONDANSETRON HCL 4 MG PO TABS: 4.0000 mg | ORAL_TABLET | Freq: Four times a day (QID) | ORAL | Status: DC | PRN

## 2019-03-06 MED ORDER — FAMOTIDINE 20 MG PO TABS
ORAL_TABLET | ORAL | Status: AC
  Filled 2019-03-06: qty 1

## 2019-03-06 MED ORDER — ONDANSETRON HCL 4 MG/2ML IJ SOLN: 4.0000 mg | Freq: Once | INTRAMUSCULAR | Status: DC | PRN

## 2019-03-06 MED ORDER — MIDAZOLAM HCL 2 MG/2ML IJ SOLN
INTRAMUSCULAR | Status: AC
  Filled 2019-03-06: qty 2

## 2019-03-06 MED ORDER — BACITRACIN 50000 UNITS IM SOLR
INTRAMUSCULAR | Status: AC
  Filled 2019-03-06: qty 1

## 2019-03-06 MED ORDER — SODIUM CHLORIDE 0.9 % IV SOLN
INTRAVENOUS | Status: DC
  Administered 2019-03-06: 22:00:00 via INTRAVENOUS

## 2019-03-06 MED ORDER — VANCOMYCIN HCL 10 G IV SOLR
2000.0000 mg | Freq: Once | INTRAVENOUS | Status: AC
  Administered 2019-03-06: 2000 mg via INTRAVENOUS
  Filled 2019-03-06: qty 2000

## 2019-03-06 MED ORDER — ACETAMINOPHEN 10 MG/ML IV SOLN
INTRAVENOUS | Status: AC
  Filled 2019-03-06: qty 100

## 2019-03-06 MED ORDER — ATENOLOL 25 MG PO TABS
50.0000 mg | ORAL_TABLET | Freq: Every day | ORAL | Status: DC
  Administered 2019-03-07: 50 mg via ORAL
  Filled 2019-03-06 (×2): qty 2

## 2019-03-06 MED ORDER — DIPHENHYDRAMINE HCL 50 MG/ML IJ SOLN
25.0000 mg | Freq: Once | INTRAMUSCULAR | Status: AC
  Administered 2019-03-06: 12:00:00 25 mg via INTRAVENOUS

## 2019-03-06 MED ORDER — FAMOTIDINE 20 MG PO TABS
20.0000 mg | ORAL_TABLET | Freq: Once | ORAL | Status: AC
  Administered 2019-03-06: 20 mg via ORAL

## 2019-03-06 MED ORDER — SODIUM CHLORIDE 0.9% FLUSH: 3.0000 mL | INTRAVENOUS | Status: DC | PRN

## 2019-03-06 MED ORDER — HYDROCHLOROTHIAZIDE 25 MG PO TABS
25.0000 mg | ORAL_TABLET | Freq: Every day | ORAL | Status: DC
  Administered 2019-03-07 – 2019-03-08 (×2): 25 mg via ORAL
  Filled 2019-03-06 (×2): qty 1

## 2019-03-06 MED ORDER — SODIUM CHLORIDE 0.9 % IV SOLN
INTRAVENOUS | Status: DC | PRN
  Administered 2019-03-06: 40 mL

## 2019-03-06 MED ORDER — PROPOFOL 10 MG/ML IV BOLUS
INTRAVENOUS | Status: AC
  Filled 2019-03-06: qty 20

## 2019-03-06 MED ORDER — ONDANSETRON HCL 4 MG/2ML IJ SOLN: 4.0000 mg | Freq: Four times a day (QID) | INTRAMUSCULAR | Status: DC | PRN

## 2019-03-06 MED ORDER — DIAZEPAM 5 MG PO TABS: 5.0000 mg | ORAL_TABLET | Freq: Once | ORAL | Status: DC

## 2019-03-06 MED ORDER — KETAMINE HCL 50 MG/ML IJ SOLN
INTRAMUSCULAR | Status: AC
  Filled 2019-03-06: qty 10

## 2019-03-06 MED ORDER — ACETAMINOPHEN 500 MG PO TABS
1000.0000 mg | ORAL_TABLET | Freq: Four times a day (QID) | ORAL | Status: AC
  Administered 2019-03-06 – 2019-03-07 (×3): 1000 mg via ORAL
  Filled 2019-03-06 (×3): qty 2

## 2019-03-06 MED ORDER — FENTANYL CITRATE (PF) 100 MCG/2ML IJ SOLN
INTRAMUSCULAR | Status: AC
  Administered 2019-03-06: 25 ug via INTRAVENOUS
  Filled 2019-03-06: qty 2

## 2019-03-06 MED ORDER — REMIFENTANIL HCL 1 MG IV SOLR
INTRAVENOUS | Status: DC | PRN
  Administered 2019-03-06: .1 ug/kg/min via INTRAVENOUS

## 2019-03-06 MED ORDER — ACETAMINOPHEN 650 MG RE SUPP: 650.0000 mg | RECTAL | Status: DC | PRN

## 2019-03-06 SURGICAL SUPPLY — 92 items
ADH SKN CLS APL DERMABOND .7 (GAUZE/BANDAGES/DRESSINGS) ×2
AGENT HMST MTR 8 SURGIFLO (HEMOSTASIS) ×1
APL PRP STRL LF DISP 70% ISPRP (MISCELLANEOUS) ×4
BUR NEURO DRILL SOFT 3.0X3.8M (BURR) ×2 IMPLANT
CAGE MODULUS XL 10X18X50 - 10 (Cage) ×1 IMPLANT
CANISTER SUCT 1200ML W/VALVE (MISCELLANEOUS) ×4 IMPLANT
CHLORAPREP W/TINT 26 (MISCELLANEOUS) ×8 IMPLANT
CORD BIP STRL DISP 12FT (MISCELLANEOUS) ×2 IMPLANT
COUNTER NEEDLE 20/40 LG (NEEDLE) ×3 IMPLANT
COVER BACK TABLE REUSABLE LG (DRAPES) ×2 IMPLANT
COVER LIGHT HANDLE STERIS (MISCELLANEOUS) ×8 IMPLANT
COVER WAND RF STERILE (DRAPES) ×2 IMPLANT
CRADLE LAMINECT ARM (MISCELLANEOUS) ×4 IMPLANT
CUP MEDICINE 2OZ PLAST GRAD ST (MISCELLANEOUS) ×2 IMPLANT
DERMABOND ADVANCED (GAUZE/BANDAGES/DRESSINGS) ×2
DERMABOND ADVANCED .7 DNX12 (GAUZE/BANDAGES/DRESSINGS) ×2 IMPLANT
DRAPE C-ARM 42X72 X-RAY (DRAPES) ×4 IMPLANT
DRAPE C-ARMOR (DRAPES) ×3 IMPLANT
DRAPE INCISE IOBAN 66X45 STRL (DRAPES) ×4 IMPLANT
DRAPE INCISE IOBAN 66X60 STRL (DRAPES) ×1 IMPLANT
DRAPE LAPAROTOMY 100X77 ABD (DRAPES) ×4 IMPLANT
DRAPE MICROSCOPE SPINE 48X150 (DRAPES) ×2 IMPLANT
DRAPE POUCH INSTRU U-SHP 10X18 (DRAPES) ×2 IMPLANT
DRAPE SURG 17X11 SM STRL (DRAPES) ×16 IMPLANT
DRSG OPSITE POSTOP 4X6 (GAUZE/BANDAGES/DRESSINGS) IMPLANT
DRSG TEGADERM 2-3/8X2-3/4 SM (GAUZE/BANDAGES/DRESSINGS) IMPLANT
DRSG TEGADERM 4X4.75 (GAUZE/BANDAGES/DRESSINGS) IMPLANT
DRSG TEGADERM 6X8 (GAUZE/BANDAGES/DRESSINGS) IMPLANT
DRSG TELFA 3X8 NADH (GAUZE/BANDAGES/DRESSINGS) IMPLANT
DRSG TELFA 4X3 1S NADH ST (GAUZE/BANDAGES/DRESSINGS) IMPLANT
ELECT CAUTERY BLADE TIP 2.5 (TIP) ×4
ELECT EZSTD 165MM 6.5IN (MISCELLANEOUS) ×2
ELECT REM PT RETURN 9FT ADLT (ELECTROSURGICAL) ×4
ELECTRODE CAUTERY BLDE TIP 2.5 (TIP) ×2 IMPLANT
ELECTRODE EZSTD 165MM 6.5IN (MISCELLANEOUS) ×1 IMPLANT
ELECTRODE REM PT RTRN 9FT ADLT (ELECTROSURGICAL) ×2 IMPLANT
FEE INTRAOP MONITOR IMPULS NCS (MISCELLANEOUS) IMPLANT
FRAME EYE SHIELD (PROTECTIVE WEAR) ×4 IMPLANT
GAUZE 4X4 16PLY RFD (DISPOSABLE) ×1 IMPLANT
GLOVE BIOGEL PI IND STRL 7.0 (GLOVE) ×2 IMPLANT
GLOVE BIOGEL PI INDICATOR 7.0 (GLOVE) ×2
GLOVE SURG SYN 7.0 (GLOVE) ×8 IMPLANT
GLOVE SURG SYN 7.0 PF PI (GLOVE) ×4 IMPLANT
GLOVE SURG SYN 8.5  E (GLOVE) ×6
GLOVE SURG SYN 8.5 E (GLOVE) ×6 IMPLANT
GLOVE SURG SYN 8.5 PF PI (GLOVE) ×6 IMPLANT
GOWN SRG XL LVL 3 NONREINFORCE (GOWNS) ×2 IMPLANT
GOWN STRL NON-REIN TWL XL LVL3 (GOWNS) ×4
GOWN STRL REUS W/TWL MED LVL3 (GOWN DISPOSABLE) ×4 IMPLANT
GRADUATE 1200CC STRL 31836 (MISCELLANEOUS) ×2 IMPLANT
GUIDEWIRE NITINOL BEVEL TIP (WIRE) ×4 IMPLANT
INTRAOP MONITOR FEE IMPULS NCS (MISCELLANEOUS)
INTRAOP MONITOR FEE IMPULSE (MISCELLANEOUS)
KIT DILATOR XLIF 5 (KITS) IMPLANT
KIT NDL NVM5 EMG ELECT (KITS) IMPLANT
KIT NEEDLE NVM5 EMG ELECT (KITS) ×1 IMPLANT
KIT NEEDLE NVM5 EMG ELECTRODE (KITS) ×1
KIT SPINAL PRONEVIEW (KITS) ×2 IMPLANT
KIT SURGICAL ACCESS MAXCESS 4 (KITS) ×1 IMPLANT
KIT TURNOVER KIT A (KITS) ×2 IMPLANT
KIT XLIF (KITS) ×1
KNIFE BAYONET SHORT DISCETOMY (MISCELLANEOUS) IMPLANT
MARKER SKIN DUAL TIP RULER LAB (MISCELLANEOUS) ×6 IMPLANT
NDL I PASS (NEEDLE) IMPLANT
NDL SAFETY ECLIPSE 18X1.5 (NEEDLE) ×1 IMPLANT
NEEDLE HYPO 18GX1.5 SHARP (NEEDLE) ×2
NEEDLE HYPO 22GX1.5 SAFETY (NEEDLE) ×3 IMPLANT
NEEDLE I PASS (NEEDLE) ×4 IMPLANT
PACK LAMINECTOMY NEURO (CUSTOM PROCEDURE TRAY) ×2 IMPLANT
PAD ARMBOARD 7.5X6 YLW CONV (MISCELLANEOUS) ×2 IMPLANT
PAD DRESSING TELFA 3X8 NADH (GAUZE/BANDAGES/DRESSINGS) IMPLANT
PENCIL ELECTRO HAND CTR (MISCELLANEOUS) ×2 IMPLANT
PUTTY DBM PROPEL MEDIUM (Putty) ×1 IMPLANT
ROD RELINE MAS TI LORD 5.5X40 (Rod) ×2 IMPLANT
SCREW LOCK RELINE 5.5 TULIP (Screw) ×4 IMPLANT
SCREW RELINE RED 6.5X45MM POLY (Screw) ×4 IMPLANT
SPOGE SURGIFLO 8M (HEMOSTASIS) ×1
SPONGE GAUZE 2X2 8PLY STRL LF (GAUZE/BANDAGES/DRESSINGS) IMPLANT
SPONGE SURGIFLO 8M (HEMOSTASIS) ×1 IMPLANT
STAPLER SKIN PROX 35W (STAPLE) IMPLANT
SUT DVC VLOC 3-0 CL 6 P-12 (SUTURE) ×4 IMPLANT
SUT ETHILON 3-0 FS-10 30 BLK (SUTURE)
SUT VIC AB 0 CT1 27 (SUTURE) ×2
SUT VIC AB 0 CT1 27XCR 8 STRN (SUTURE) ×1 IMPLANT
SUT VIC AB 2-0 CT1 18 (SUTURE) ×3 IMPLANT
SUTURE EHLN 3-0 FS-10 30 BLK (SUTURE) IMPLANT
SYR 30ML LL (SYRINGE) ×4 IMPLANT
TOWEL OR 17X26 4PK STRL BLUE (TOWEL DISPOSABLE) ×6 IMPLANT
TRAY FOLEY MTR SLVR 16FR STAT (SET/KITS/TRAYS/PACK) ×1 IMPLANT
TUBE MATRX SPINL 18MM 7CM DISP (INSTRUMENTS) ×2
TUBE METRX SPINAL 18X7 DISP (INSTRUMENTS) IMPLANT
TUBING CONNECTING 10 (TUBING) ×6 IMPLANT

## 2019-03-06 NOTE — Anesthesia Preprocedure Evaluation (Signed)
Anesthesia Evaluation  Patient identified by MRN, date of birth, ID band Patient awake    Reviewed: Allergy & Precautions, H&P , NPO status , Patient's Chart, lab work & pertinent test results, reviewed documented beta blocker date and time   Airway Mallampati: II  TM Distance: >3 FB Neck ROM: full    Dental  (+) Teeth Intact   Pulmonary neg pulmonary ROS, asthma , former smoker,    Pulmonary exam normal        Cardiovascular Exercise Tolerance: Good hypertension, On Medications negative cardio ROS Normal cardiovascular exam Rhythm:regular Rate:Normal     Neuro/Psych PSYCHIATRIC DISORDERS Depression negative neurological ROS  negative psych ROS   GI/Hepatic negative GI ROS, Neg liver ROS,   Endo/Other  negative endocrine ROSdiabetes, Well Controlled, Type 2, Oral Hypoglycemic Agents  Renal/GU negative Renal ROS  negative genitourinary   Musculoskeletal   Abdominal   Peds  Hematology negative hematology ROS (+)   Anesthesia Other Findings Past Medical History: No date: Anal fissure     Comment:  history of No date: Asthma No date: Depression No date: Diabetes mellitus without complication ([place]) 4/66/5993: DM (diabetes mellitus) ([place]) No date: Hypertension No date: Lichen     Comment:  hands and feet No date: Obesity No date: Substance abuse ([place])     Comment:  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 bleeding. ovaries left in situ   Reproductive/Obstetrics negative OB ROS                             Anesthesia Physical Anesthesia Plan  ASA: II  Anesthesia Plan: General ETT   Post-op Pain  Management:    Induction:   PONV Risk Score and Plan:   Airway Management Planned:   Additional Equipment:   Intra-op Plan:   Post-operative Plan:   Informed Consent: I have reviewed the patients History and Physical, chart, labs and discussed the procedure including the risks, benefits and alternatives for the proposed anesthesia with the patient or authorized representative who has indicated his/her understanding and acceptance.     Dental Advisory Given  Plan Discussed with: CRNA  Anesthesia Plan Comments:         Anesthesia Quick Evaluation

## 2019-03-06 NOTE — Op Note (Signed)
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  She had failed conservative management and elected for surgical intervention.  She also suffered from anterolisthesis.  Findings: synovial cyst at L4-5  Preoperative Diagnosis: Synovial Cyst of Lumbar spine, anterolisthesis Postoperative Diagnosis: same   EBL: 200 ml IVF: 2200 ml Drains: none Disposition: Extubated and Stable to PACU Complications: none  A foley catheter was placed.   Preoperative Note:   Risks of surgery discussed include: infection, bleeding, stroke, coma, death, paralysis, CSF leak, nerve/spinal cord injury, numbness, tingling, weakness, complex regional pain syndrome, recurrent stenosis and/or disc herniation, vascular injury, development of instability, neck/back pain, need for further surgery, persistent symptoms, development of deformity, and the risks of anesthesia. The patient understood these risks and agreed to proceed.  NAME OF ANTERIOR PROCEDURE:               1. Anterior lumbar interbody fusion via a left lateral retroperitoneal approach at L4/5 2. Placement of a Lordotic Modulus  10x18x50 interbody cage, filled with Demineralized Bone Matrix    NAME OF POSTERIOR PROCEDURE: 1. Posterior instrumentation using   Nuvasive Reline Instrumentation 2. Posterolateral fusion, L4-5  3. Laminectomy for resection of an extradural lumbar spinal mass, L4-5  4. Use of microscope   PROCEDURE:  Patient was brought to the operating room, intubated, turned to the lateral position.  All pressure points were checked and double-checked.  The patient was prepped and draped in the standard fashion. Prior to prepping, fluoroscopy was brought in and the patient was positioned with a large bump under the contralateral side between the iliac crest and rib cage, allowing the area between the iliac crest and the lateral aspect of the rib cage to open and increase the ability to  reach inferiorly, to facilitate entry into the disc space.  The incision was marked upon the skin both the location of the disc space as well as the superior most aspect of the iliac crest.  Based on the identification of the disc space an incision was prepared, marked upon the skin and eventually was used for our lateral incision.  The fluoroscopy was turned into a cross table A/P image in order to confirm that the patient's spine remained in a perpendicular trajectory to the floor without rotation.  Once confirming that all the pressure points were checked and double-checked and the patient remained in sturdy position strapped down in this slightly jack-knifed lateral position, the patient was prepped and draped in standard fashion.  The skin was injected with local anesthetic, then incised until the abdominal wall fascia was noted.  I bluntly dissected posteriorly until we were able to identify the posterior musculature near petit's triangle.  At this point, using primarily blunt dissection with our finger aided with a metzenbaum scissor, were able to enter the retroperitoneal cavity.  The retroperitoneal potential space was opened further until palpating out the psoas muscle, the medial aspect of the iliac crest, the medial aspect of the last rib and continued to define the retroperitoneal space with blunt dissection in order to facilitate safe placement of our dilators.    While protecting by dissecting directly onto a finger in the retroperitoneum, the retroperitoneal space was entered safely from the lateral incision and the initial dilator placed onto the muscle belly of the psoas.  While directly stimulating the dilator and after radiographically confirming our location relative to the disc space, I placed the dilator through the  psoas.  The dilators were stimulated to ensure remaining safely away from any of the lumbar plexus nerves; the dilators were repositioned until no pathologic stimulation was  appreciated.  Once I had confirmed the location of our initial dilator radiographically, a K-wire secured the dilator into the L4/5 disc space and confirmed position under A/P and lateral fluoroscopy.  At this point, I dilated up with direct stimulation to confirm lack of pathologic stimulation.  Once all the dilators were in position, I placed in the retractor and secured it onto the table, locked into position and confirmed under A/P and lateral fluoroscopy to confirm our approach angle to the disc space as well as location relative to the disc space.  I then placed the muscle stimulator in through the working channel down to the vertebral body, stimulating the entire lateral surface of the vertebral body and any of the visualized psoas muscle that was adjacent to the retractor, confirming again the safe passage to the psoas before we began performing the discectomy.  At this point, we began our discectomy at L4/5.  The disc was incised laterally throughout the extent of our exposure. Using a combination of pituitary rongeurs, Kerrison rongeurs, rasps, curettes of various sorts, we were able to begin to clean out the disc space.  Once we had cleaned out the majority of the disc space, we then cut the lateral annulus with a cob, breaking the lateral annual attachments on the contralateral side by subtly working the cob through the annulus while using flouroscopy.  Care was taken not to extend further than required after cutting the annular attachments.  After this had been performed, we prepared the endplates for placement of our graft, sized a graft to the disc space by serially dilating up in trial sizes until we confirmed that our graft would be well positioned, allowing distraction while maintaining good grip.  This was confirmed under A/P and lateral fluoroscopy in order to ensure its placement as an eventual trial for placement of our final graft.  We irrigated with bacteriostatic saline.  Once confirmed  placement, the Modulus implant filled with allograft was impacted into position at L4/5.   Through a combination of intradiscal distraction and anterior releasing, we were able to correct the anterior deformity during disc preparation and placement of the graft.          At this point, final radiographs were performed, and we began closure.  The wound was closed using 0 Vicryl interrupted suture in the fascia and 2-0 Vicryl inverted suture were placed in the subcutaneous tissue and dermis. 3-0 monocryl was used for final closure. Dermabond was used to close the skin.    After closing the anterior part in layers, the patient was repositioned into prone position.  All pressure points were checked and double-checked and we brought in fluoroscopy to confirm our approach angles for putting in percutaneous pedicle screws.  The pedicles were marked using true AP flouroscopy, adjusting the angle at each level.  We then prepped and draped the patient in the standard fashion.  At this point, incisions were made for placing percutaneous pedicle screw instrumentation at L4-5.  Starting at L4, a Jamsheedi needle was used to cannulate the pedicle bilaterally using AP flouroscopy. Direct stimulation was used on the needle without any low (<15 mAmp) stimulation thresholds. After cannulation of the pedicle to 30 mm, a K-wire was placed through the Killen approximately and secured.   Using a similar technique, the pedicles at L4-L5 were  cannulated and K wires secured. The K wires were then checked using lateral flouroscopy to ensure placement into the vertebral bodies. After confirming placement of K wires, cannulated pedicle screws were introduced over the K wires at each level.  After advancing each screw into the vertebral body approximately 25-30 mm, the K wire was removed.At each level, 6.5x9545mm Nuvasive Reline pedicle screws were placed under lateral flouroscopy. Once the screws were placed, the screw extensions  were then linked, a path was formed for the rod and a rod was utilized to connect the screws.  We then compressed, torqued / counter-torqued and removed the screw assembly. Once performed on each side, confirmatory AP and lateral x-rays were taken and the case was completed.   At this point, we moved to resection of the extradural mass.  The left incision was used for the approach.  A new paramedian fascial incision was made for the tubular approach.   The metrx tubes were sequentially advanced and confirmed in position at L4-5. An 18mm by 70mm tube was locked in place to the bed side attachment.  The microscope was then sterilely brought into the field and muscle creep was hemostased with a bipolar and resected with a pituitary rongeur.  A Bovie extender was then used to expose the spinous process and lamina.  A 3 mm matchstick drill bit was then used to make a hemi-laminotomy trough until the ligamentum flavum was exposed.  This was extended to the base of the spinous process and to the contralateral side to remove all the central bone from each side.  The drill was used to widen the laminotomy to the left until the inferior articulating process of L4 was nearly removed and disarticulated from the superior articulating process of L5.  Once this was complete and the underlying ligamentum flavum was visualized, it was dissected with a curette and resected with Kerrison rongeurs.   At this point, the dura was identified and the epidural plane developed.  The mass was noted on the left side of the spinal canal. A safe plane was developed around the left-sided extradural mass.  This was dissected free of the dura, then resected in piecemeal fashion using microscopic dissection.  The mass was sent off for pathologic examination.    After removal of the mass, the balltip probe was used to confirm decompression of the ipsilateral L5 nerve root.  Again we confirmed radiographically and began our closure.  The  wound was closed using 0 Vicryl interrupted suture in the fascia, 2-0 Vicryl inverted suture were placed in the subcutaneous tissue and dermis. Staples were used for final closure.   Needle, lap and all counts were correct at the end of the case.    Nancy DrapeAmanda Ferri PA assisted in the entire procedure.  Nancy Thornton Nancy Weiss MD Neurosurgery

## 2019-03-06 NOTE — Anesthesia Procedure Notes (Signed)
Procedure Name: Intubation Date/Time: 03/06/2019 2:19 PM Performed by: Willette Alma, CRNA Pre-anesthesia Checklist: Patient identified, Patient being monitored, Timeout performed, Emergency Drugs available and Suction available Patient Re-evaluated:Patient Re-evaluated prior to induction Oxygen Delivery Method: Circle system utilized Preoxygenation: Pre-oxygenation with 100% oxygen Induction Type: IV induction Ventilation: Mask ventilation without difficulty Laryngoscope Size: 3 and McGraph Grade View: Grade I Tube type: Oral Tube size: 7.0 mm Number of attempts: 1 Airway Equipment and Method: Stylet Placement Confirmation: ETT inserted through vocal cords under direct vision,  positive ETCO2 and breath sounds checked- equal and bilateral Secured at: 21 cm Tube secured with: Tape Dental Injury: Teeth and Oropharynx as per pre-operative assessment

## 2019-03-06 NOTE — H&P (Signed)
History of Present Illness: 03/06/2019 Ms. Flannigan presents today for surgery.  She continues to have incapacitating leg pain.  She was able to quit smoking for the surgery.  01/31/2019  Since her last visit, Ms. Gong has tried physical therapy as well as an injection. The podiatrist attempted to aspirate the left-sided synovial cyst but was unable to do so. She tried multiple visits with physical therapy, but was unable to obtain pain relief. She has now and incapacitated situation given her severe left leg and back pain. She would like to consider surgical intervention.  Please note that she has stopped smoking. She has been abstinent from smoking for approximately 3 weeks at this point. She removed her nicotine patch 2 days ago. She is committed to stopping smoking for this if she requires surgical intervention.  11/15/2018 Ms. Tanishi Abril is here today with a chief complaint of left-sided low back pain that radiates to the outside of her left leg.  She previously had surgery for resection of a synovial cyst in 2016. She has had recurrence of her symptoms over the past year and a half and now has pain at approximately 8 out of 10. It is made worse by walking and sitting down. Is made better by medications. She denies weakness or bowel bladder dysfunction.  She had a meniscus injury and September 2018 and ultimately had surgery in March 2019 which helped her left knee significantly. Since that time, she has had severe pain down her leg in an L5 distribution.  Conservative measures:  Physical therapy: has not tried since previous surgery Multimodal medical therapy including regular antiinflammatories: ibuprofen, gabapentin, tylenol Injections: has not tried epidural steroid injections  Past Surgery: laminectomy for resection of synovial cyst in 2016  Ermal Salahuddin has no symptoms of cervical myelopathy.  The symptoms are causing a significant impact on the patient's life.   Review of  Systems:  A 10 point review of systems is negative, except for the pertinent positives and negatives detailed in the HPI.  Past Medical History: Past Medical History:  Diagnosis Date  . Alcohol abuse  . Anal fissure  . Asthma  . Depression  . Diabetes (CMS-HCC)  . High cholesterol  . Hypertension  . Lichen  . Obesity  . Vitamin D deficiency   Past Surgical History: Past Surgical History:  Procedure Laterality Date  . back surgery  . CHOLECYSTECTOMY  . Cystostomy w/ bladder biopsy 1990  . TONSILLECTOMY  . torn meniscus  . total vaginal hysterectomy   Allergies:  Allergies  Allergen Reactions  . Penicillins Hives    Did it involve swelling of the face/tongue/throat, SOB, or low BP? No Did it involve sudden or severe rash/hives, skin peeling, or any reaction on the inside of your mouth or nose? Yes Did you need to seek medical attention at a hospital or doctor's office? No When did it last happen?Occurred at age 17 If all above answers are "NO", may proceed with cephalosporin use.   . Meloxicam Hypertension  . Bactrim [Sulfamethoxazole-Trimethoprim] Rash     Medications:  Current Meds  Medication Sig  . acetaminophen (TYLENOL) 500 MG tablet Take 1,000 mg by mouth every 6 (six) hours as needed (pain.).  Marland Kitchen albuterol (PROAIR HFA) 108 (90 Base) MCG/ACT inhaler Inhale 2 puffs into the lungs every 6 (six) hours as needed for wheezing or shortness of breath.  Marland Kitchen amLODipine (NORVASC) 2.5 MG tablet Take 1 tablet by mouth once daily  . atenolol (TENORMIN) 50 MG tablet  Take 1 tablet (50 mg total) by mouth daily.  Marland Kitchen atorvastatin (LIPITOR) 10 MG tablet Take 1 tablet by mouth once daily (Patient taking differently: Take 10 mg by mouth daily. )  . gabapentin (NEURONTIN) 300 MG capsule Take 1 capsule (300 mg total) by mouth 2 (two) times daily. (Patient taking differently: Take 300 mg by mouth 3 (three) times daily as needed (pain.). )  . ibuprofen (ADVIL) 200 MG tablet Take 600 mg  by mouth at bedtime.  Marland Kitchen lisinopril-hydrochlorothiazide (PRINZIDE,ZESTORETIC) 20-25 MG tablet Take 1 tablet by mouth daily.  . Melatonin 5 MG TABS Take 5 mg by mouth at bedtime.  . metFORMIN (GLUCOPHAGE) 1000 MG tablet Take 1 tablet (1,000 mg total) by mouth 2 (two) times daily with a meal.    No facility-administered encounter medications on file as of 01/31/2019.   Social History:  Social History   Socioeconomic History  . Marital status: Divorced    Spouse name: Not on file  . Number of children: 1  . Years of education: Not on file  . Highest education level: Not on file  Occupational History  . 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 Financial resource strain: Not on file  . Food insecurity    Worry: Not on file    Inability: Not on file  . Transportation needs    Medical: Not on file    Non-medical: Not on file  Tobacco Use  . Smoking status: Former Smoker    Packs/day: 0.50    Types: Cigarettes    Start date: 10/18/1977    Quit date: 02/10/2019    Years since quitting: 0.0  . Smokeless tobacco: Never Used  Substance and Sexual Activity  . Alcohol use: No  . Drug use: No  . Sexual activity: Not Currently  Lifestyle  . Physical activity    Days per week: Not on file    Minutes per session: Not on file  . Stress: Not on file  Relationships  . Social Musician on phone: Not on file    Gets together: Not on file    Attends religious service: Not on file    Active member of club or organization: Not on file    Attends meetings of clubs or organizations: Not on file    Relationship status: Not on file  . Intimate partner violence    Fear of current or ex partner: Not on file    Emotionally abused: Not on file    Physically abused: Not on file    Forced sexual activity: Not on file  Other Topics Concern  . Not on file  Social History Narrative  . Not on file     Family Medical History: No family history on  file.  Physical Examination: Vitals:   Today's Vitals   03/06/19 1021  BP: (!) 148/82  Pulse: (!) 54  Resp: 18  Temp: 97.7 F (36.5 C)  TempSrc: Tympanic  SpO2: 98%  PainSc: 0-No pain   There is no height or weight on file to calculate BMI.  Heart sounds normal no MRG. Chest Clear to Auscultation Bilaterally.   General: Patient is well developed, well nourished, calm, collected, and in no apparent distress. Attention to examination is appropriate.  Psychiatric: Patient is non-anxious.  Head: Pupils equal, round, and reactive to light.  ENT: Oral mucosa appears well hydrated.  Neck: Supple. Full range of motion.  Respiratory: Patient is breathing without any  difficulty.  Extremities: No edema.  Vascular: Palpable dorsal pedal pulses.  Skin: On exposed skin, there are no abnormal skin lesions.  NEUROLOGICAL:   Awake, alert, oriented to person, place, and time. Speech is clear and fluent. Fund of knowledge is appropriate.   Cranial Nerves: Pupils equal round and reactive to light. Facial tone is symmetric. Facial sensation is symmetric. Shoulder shrug is symmetric. Tongue protrusion is midline. There is no pronator drift.  ROM of spine: full. Palpation of spine: non tender.   Strength: Side Biceps Triceps Deltoid Interossei Grip Wrist Ext. Wrist Flex.  R 5 5 5 5 5 5 5   L 5 5 5 5 5 5 5    Side Iliopsoas Quads Hamstring PF DF EHL  R 5 5 5 5 5 5   L 5 5 5 5 5 5    Reflexes are 1+ and symmetric at the biceps, triceps, brachioradialis, patella and achilles. Hoffman's is absent. Clonus is not present. Toes are down-going.  Bilateral upper and lower extremity sensation is intact to light touch.  Gait is antalgic.Rapid alternating movements are normal.   Medical Decision Making  Imaging: MRI L spine 09/11/2018 IMPRESSION: New large synovial cyst off the medial margin of the left L4-5 facets impinges on both the descending left L4 and L5 roots and deforms the left  side of the thecal sac. Synovial cyst off the medial margin of the right facet seen on the prior MRI has been resected. A thin synovial cyst off the right facet impinges on the descending right L5 root.  New shallow broad-based right paracentral protrusion at L5-S1 slightly deflects the descending right S1 root but the root is not compressed.  Electronically Signed By: Seren Kannerhomas Dalessio M.D. On: 09/11/2018 12:44 L4-5 level showing large L synovial cyst  I have personally reviewed the images and agree with the above interpretation.  Assessment and Plan: Ms. Rachelle HoraMoss is a pleasant 58 y.o. female with left L5 radiculopathy due to significant synovial cyst on the left side at L4-5. She also has a grade 1 spondylolisthesis of L4 on L5. She has failed conservative management at this point.  She now has incapacitating back pain. She has a very large synovial cyst on the left side at L4-5. She has prior history of right-sided synovial cyst resection at L4-5. Due to the size of her synovial cyst, I feel that I would have to remove greater than 50% of the facet joint on the left side at L4-5. Given that she is already had a partial facet resection on the right side, this would cause instability at the L4-5 level. Additionally, she has a grade 1 spondylolisthesis at this level. Thus, destabilization of the L4-5 joint would be required to resect the lesion and decompress the nerve roots.  Because of this destabilization, we will proceed with L4-5 lateral lumbar interbody fusion followed by posterior fusion and excision of the synovial cyst as well as the facet joint on the left side.   Venetia Nighthester Reha Martinovich MD, [facility] Department of Neurosurgery

## 2019-03-06 NOTE — Transfer of Care (Signed)
Immediate Anesthesia Transfer of Care Note  Patient: Nancy Thornton  Procedure(s) Performed: L4-5 XLIF, L4-5 PSF, LEFT SYNOVIAL CYST RESECTION L4-5 (N/A Back)  Patient Location: PACU  Anesthesia Type:General  Level of Consciousness: awake and patient cooperative  Airway & Oxygen Therapy: Patient Spontanous Breathing and Patient connected to face mask oxygen  Post-op Assessment: Report given to RN and Post -op Vital signs reviewed and stable  Post vital signs: Reviewed and stable  Last Vitals:  Vitals Value Taken Time  BP 153/90 03/06/19 1934  Temp 36.5 C 03/06/19 1930  Pulse 87 03/06/19 1934  Resp 12 03/06/19 1934  SpO2 100 % 03/06/19 1934  Vitals shown include unvalidated device data.  Last Pain:  Vitals:   03/06/19 1021  TempSrc: Tympanic  PainSc: 0-No pain         Complications: No apparent anesthesia complications

## 2019-03-06 NOTE — Progress Notes (Signed)
Patient c/o severe itching to scalp; redness noted to head, face and chest.  Dr. Izora Ribas notified of reaction; rate of vancomycin was slowed and Benadryl 25 mg IV administered.  Patient in no acute distress.  Denies any other symptoms.  Will continue to monitor.

## 2019-03-06 NOTE — Anesthesia Post-op Follow-up Note (Signed)
Anesthesia QCDR form completed.        

## 2019-03-06 NOTE — Anesthesia Postprocedure Evaluation (Signed)
Anesthesia Post Note  Patient: Nancy Thornton  Procedure(s) Performed: L4-5 XLIF, L4-5 PSF, LEFT SYNOVIAL CYST RESECTION L4-5 (N/A Back)  Patient location during evaluation: PACU Anesthesia Type: General Level of consciousness: awake and alert Pain management: pain level controlled Vital Signs Assessment: post-procedure vital signs reviewed and stable Respiratory status: spontaneous breathing and respiratory function stable Cardiovascular status: stable Anesthetic complications: no     Last Vitals:  Vitals:   03/06/19 2005 03/06/19 2010  BP:    Pulse: 66 86  Resp: 12 17  Temp:    SpO2: 98% 99%    Last Pain:  Vitals:   03/06/19 2010  TempSrc:   PainSc: 7                  [name] K

## 2019-03-06 NOTE — Progress Notes (Signed)
Procedure: L4-5 XLIF with L4-5 extradural mass removal Procedure date: 03/06/2019 Diagnosis: Synovial cyst of lumbar spine  History: Nancy Thornton is s/p L4-5 XLIF with L4-5 extradural mass removal for synovial cyst in the lumbar spine.  POD0: Tolerated procedure well.  Evaluated in postop recovery still disoriented from anesthesia.  Complains of back soreness currently rated 8/10.  Denies any lower extremity symptoms such as pain/numbness/tingling.  Physical Exam: Vitals:   03/06/19 1021  BP: (!) 148/82  Pulse: (!) 54  Resp: 18  Temp: 97.7 F (36.5 C)  SpO2: 98%    Strength: 5/5 throughout lower extremities Sensation: Intact and symmetric throughout lower extremities Skin: Dressing clean and dry at lumbar spine  Data:  Recent Labs  Lab 03/01/19 1124  NA 140  K 4.3  CL 100  CO2 29  BUN 18  CREATININE 0.67  GLUCOSE 152*  CALCIUM 9.3   No results for input(s): AST, ALT, ALKPHOS in the last 168 hours.  Invalid input(s): TBILI   Recent Labs  Lab 03/01/19 1124  WBC 8.3  HGB 14.1  HCT 39.6  PLT 250   Recent Labs  Lab 03/01/19 1124  APTT 25  INR 0.9         Other tests/results: Lumbar x-rays pending  Assessment/Plan:  Nancy Thornton is POD 0 status post L4-5 lumbar fusion with removal of L4-5 extradural mass.  We will continue to monitor  - mobilize - pain control - DVT prophylaxis - PTOT - Catheter care (remove POD1)  Marin Olp PA-C Department of Neurosurgery

## 2019-03-07 ENCOUNTER — Other Ambulatory Visit: Payer: Self-pay

## 2019-03-07 ENCOUNTER — Inpatient Hospital Stay: Payer: PRIVATE HEALTH INSURANCE

## 2019-03-07 ENCOUNTER — Encounter: Payer: Self-pay | Admitting: Neurosurgery

## 2019-03-07 LAB — GLUCOSE, CAPILLARY
Glucose-Capillary: 241 mg/dL — ABNORMAL HIGH (ref 70–99)
Glucose-Capillary: 241 mg/dL — ABNORMAL HIGH (ref 70–99)
Glucose-Capillary: 244 mg/dL — ABNORMAL HIGH (ref 70–99)
Glucose-Capillary: 224 mg/dL — ABNORMAL HIGH (ref 70–99)

## 2019-03-07 LAB — HEMOGLOBIN A1C
Hgb A1c MFr Bld: 6.8 % — ABNORMAL HIGH (ref 4.8–5.6)
Mean Plasma Glucose: 148.46 mg/dL

## 2019-03-07 MED ORDER — DIAZEPAM 5 MG PO TABS
5.0000 mg | ORAL_TABLET | Freq: Once | ORAL | Status: AC
  Administered 2019-03-07: 5 mg via ORAL
  Filled 2019-03-07: qty 1

## 2019-03-07 NOTE — Evaluation (Signed)
Occupational Therapy Evaluation Patient Details Name: Nancy Thornton MRN: [id number] DOB: 12/01/1960 Today's Date: 03/07/2019    History of Present Illness Pt admitted for L4/L5 fusion with excision of cyst. History includes asthma, depression, DM, and alcohol abuse.   Clinical Impression   Pt is 58  year old female s/p L4/L5 fusion with excision of cyst.  Pt was independent in all ADLs prior to surgery and lives at home alone. She is eager to return to PLOF.  Pt currently requires min assist for LB dressing while in seated position due to pain and limited trunk flexion due to precautions from fusion. Pain level was 3/10 throughout session but cooperative and motivated to regain independence in ADLs.   Reviewed rec for AD for taking care of pets at home and follow precautions and guidelines after surgery and minimize pain.  Pt would benefit from further instruction in dressing techniques with assistive devices for dressing and bathing skills.  Pt has a small bathroom and can hold onto window ledge and sink but might benefit from [facility] over toilet for railings to prevent falls.  No further OT recommended after discharge.     Follow Up Recommendations  No OT follow up    Equipment Recommendations       Recommendations for Other Services       Precautions / Restrictions Precautions Precautions: Back Precaution Booklet Issued: No Precaution Comments: Pt is listed as high fall risk but does not need a chair alarm per NSG since she knows not to get up on her own, Clarified with [facility]. Restrictions Weight Bearing Restrictions: No      Mobility Bed Mobility Overal bed mobility: Independent             General bed mobility comments: cued for maintaining back precautions and educated on log rolling. ONce seated at EOB, able to sit with upright posture  Transfers Overall transfer level: Needs assistance Equipment used: Rolling walker (2 wheeled) Transfers: Sit to/from Stand Sit to  Stand: Supervision         General transfer comment: safe technique with cues for pushing from seated surface. UPright posture and no LOB    Balance Overall balance assessment: Needs assistance Sitting-balance support: Feet supported Sitting balance-Leahy Scale: Good     Standing balance support: Bilateral upper extremity supported Standing balance-Leahy Scale: Good                             ADL either performed or assessed with clinical judgement   ADL Overall ADL's : Needs assistance/impaired Eating/Feeding: Independent;Set up   Grooming: Wash/dry hands;Wash/dry face;Oral care;Brushing hair;Independent;Set up   Upper Body Bathing: Independent;Set up   Lower Body Bathing: Minimal assistance;Set up   Upper Body Dressing : Independent;Set up   Lower Body Dressing: Minimal assistance;Set up;With adaptive equipment Lower Body Dressing Details (indicate cue type and reason): Pt educated in use of reacher and sock aid and able to complete with min cues while observing precautions. Toilet Transfer: Set up;Supervision/safety;Regular Toilet;Grab bars Toilet Transfer Details (indicate cue type and reason): supervision only for toilet transfer w/o AD           General ADL Comments: Pt has a RW in room with her but requested to hold onto IV pole to walk to bathroom with supervision only needed.  Rec getting a walker bag at home and rec reacher and sock aid for LB dressing.     Vision Baseline Vision/History: No  visual deficits Patient Visual Report: No change from baseline       Perception     Praxis      Pertinent Vitals/Pain Pain Assessment: 0-10 Pain Score: 3  Faces Pain Scale: Hurts a little bit Pain Location: back Pain Descriptors / Indicators: Operative site guarding Pain Intervention(s): Limited activity within patient's tolerance;Monitored during session;Premedicated before session;Repositioned     Hand Dominance Right   Extremity/Trunk  Assessment Upper Extremity Assessment Upper Extremity Assessment: Overall WFL for tasks assessed   Lower Extremity Assessment Lower Extremity Assessment: Defer to PT evaluation   Cervical / Trunk Assessment Cervical / Trunk Assessment: Normal   Communication Communication Communication: No difficulties   Cognition Arousal/Alertness: Awake/alert Behavior During Therapy: WFL for tasks assessed/performed Overall Cognitive Status: Within Functional Limits for tasks assessed                                     General Comments       Exercises     Shoulder Instructions      Home Living Family/patient expects to be discharged to:: Private residence Living Arrangements: Alone Available Help at Discharge: Family Type of Home: House Home Access: Stairs to enter Secretary/administratorntrance Stairs-Number of Steps: 3 Entrance Stairs-Rails: None Home Layout: One level     Bathroom Shower/Tub: IT trainerTub/shower unit;Curtain   Bathroom Toilet: Standard Bathroom Accessibility: No   Home Equipment: Crutches          Prior Functioning/Environment Level of Independence: Independent        Comments: Pt plans to have her sister come from out of town to help her for first week.        OT Problem List: Pain;Decreased activity tolerance      OT Treatment/Interventions: Self-care/ADL training;DME and/or AE instruction;Therapeutic exercise;Patient/family education    OT Goals(Current goals can be found in the care plan section) Acute Rehab OT Goals Patient Stated Goal: to go home OT Goal Formulation: With patient Time For Goal Achievement: 03/14/19 Potential to Achieve Goals: Good ADL Goals Pt Will Perform Lower Body Dressing: with set-up;with supervision;with adaptive equipment;sit to/from stand Pt Will Transfer to Toilet: Independently;with set-up;regular height toilet  OT Frequency: Min 1X/week   Barriers to D/C:    lives at home alone       Co-evaluation               AM-PAC OT "6 Clicks" Daily Activity     Outcome Measure Help from another person eating meals?: None Help from another person taking care of personal grooming?: None Help from another person toileting, which includes using toliet, bedpan, or urinal?: A Little Help from another person bathing (including washing, rinsing, drying)?: A Little Help from another person to put on and taking off regular upper body clothing?: None Help from another person to put on and taking off regular lower body clothing?: A Little 6 Click Score: 21   End of Session Equipment Utilized During Treatment: Gait belt Nurse Communication: (apoke to [place]ackenzie about no need for chair alarm and she confirmed this.)  Activity Tolerance: Patient tolerated treatment well Patient left: in chair;with call bell/phone within reach  OT Visit Diagnosis: Pain;Other abnormalities of gait and mobility (R26.89) Pain - part of body: (lower back)                Time: [id number] OT Time Calculation (min): 40 min Charges:  OT General Charges $OT Visit: 1  Visit OT Evaluation $OT Eval Low Complexity: 1 Low OT Treatments $Self Care/Home Management : 23-37 mins  Chrys Racer, OTR/L, [place] ascom [phone] 03/07/19, 12:34 PM

## 2019-03-07 NOTE — TOC Progression Note (Signed)
Transition of Care [facility]) - Progression Note    Patient Details  Name: Nancy Thornton MRN: [id number] Date of Birth: 08/10/1960  Transition of Care [facility]) CM/SW [place], RN Phone Number: 03/07/2019, 11:09 AM  Clinical Narrative:      Patient needs a RW, I notified Brad with [employer] She will be going to Outpatient PT and has appointment already No additional needs      Expected Discharge Plan and Services                                                 Social Determinants of Health (SDOH) Interventions    Readmission Risk Interventions No flowsheet data found.

## 2019-03-07 NOTE — Evaluation (Signed)
Physical Therapy Evaluation Patient Details Name: Nancy Thornton MRN: [id number] DOB: [birth year] Today's Date: 03/07/2019   History of Present Illness  Pt admitted for L4/L5 fusion with excision of cyst. History includes asthma, depression, DM, and alcohol abuse.  Clinical Impression  Pt is a pleasant 58 year old female who was admitted for L4/L5 fusion with excision of cyst. Pt performs bed mobility with independence, transfers with supervision, and ambulation with cga and RW. Also performed stair training in preparation for dc to home this date. Reviewed/educated on back precautions. Pt demonstrates deficits with pain/mobility. Would benefit from skilled PT to address above deficits and promote optimal return to PLOF. Recommend further OP PT for continued therapy.    Follow Up Recommendations Outpatient PT    Equipment Recommendations  Rolling walker with 5" wheels    Recommendations for Other Services       Precautions / Restrictions Precautions Precautions: Back Precaution Booklet Issued: No Restrictions Weight Bearing Restrictions: No      Mobility  Bed Mobility Overal bed mobility: Independent             General bed mobility comments: cued for maintaining back precautions and educated on log rolling. ONce seated at EOB, able to sit with upright posture  Transfers Overall transfer level: Needs assistance Equipment used: Rolling walker (2 wheeled) Transfers: Sit to/from Stand Sit to Stand: Supervision         General transfer comment: safe technique with cues for pushing from seated surface. UPright posture and no LOB  Ambulation/Gait Ambulation/Gait assistance: Min guard Gait Distance (Feet): 200 Feet Assistive device: Rolling walker (2 wheeled) Gait Pattern/deviations: Step-through pattern     General Gait Details: needs cues and demonstration for correct RW use. Cues to keep closer to body. Also needs verbal cues for back precautions during  turns  Stairs Stairs: Yes Stairs assistance: Min guard Stair Management: No rails;Step to pattern Number of Stairs: 4 General stair comments: up/down without railing. Cued for sequencing and discussed opening storm door and maintaining balance.  Wheelchair Mobility    Modified Rankin (Stroke Patients Only)       Balance Overall balance assessment: Needs assistance Sitting-balance support: Feet supported Sitting balance-Leahy Scale: Good     Standing balance support: Bilateral upper extremity supported Standing balance-Leahy Scale: Good                               Pertinent Vitals/Pain Pain Assessment: Faces Faces Pain Scale: Hurts a little bit Pain Location: back Pain Descriptors / Indicators: Operative site guarding Pain Intervention(s): Limited activity within patient's tolerance    Home Living Family/patient expects to be discharged to:: Private residence Living Arrangements: Alone Available Help at Discharge: Family(will have sister and daughter to help out) Type of Home: House Home Access: Stairs to enter Entrance Stairs-Rails: None Technical brewer of Steps: 3 Home Layout: One level Home Equipment: Crutches      Prior Function Level of Independence: Independent               Hand Dominance        Extremity/Trunk Assessment   Upper Extremity Assessment Upper Extremity Assessment: Overall WFL for tasks assessed    Lower Extremity Assessment Lower Extremity Assessment: Overall WFL for tasks assessed       Communication   Communication: No difficulties  Cognition Arousal/Alertness: Awake/alert Behavior During Therapy: WFL for tasks assessed/performed Overall Cognitive Status: Within Functional Limits for tasks assessed  General Comments      Exercises     Assessment/Plan    PT Assessment Patient needs continued PT services  PT Problem List Decreased  mobility;Decreased knowledge of use of DME;Decreased knowledge of precautions;Pain       PT Treatment Interventions DME instruction;Gait training;Stair training;Balance training    PT Goals (Current goals can be found in the Care Plan section)  Acute Rehab PT Goals Patient Stated Goal: to go home PT Goal Formulation: With patient Time For Goal Achievement: 03/21/19 Potential to Achieve Goals: Good    Frequency 7X/week   Barriers to discharge        Co-evaluation               AM-PAC PT "6 Clicks" Mobility  Outcome Measure Help needed turning from your back to your side while in a flat bed without using bedrails?: None Help needed moving from lying on your back to sitting on the side of a flat bed without using bedrails?: None Help needed moving to and from a bed to a chair (including a wheelchair)?: None Help needed standing up from a chair using your arms (e.g., wheelchair or bedside chair)?: None Help needed to walk in hospital room?: None Help needed climbing 3-5 steps with a railing? : A Little 6 Click Score: 23    End of Session Equipment Utilized During Treatment: Gait belt Activity Tolerance: Patient tolerated treatment well Patient left: in chair(no alarm needed, safe) Nurse Communication: Mobility status PT Visit Diagnosis: Difficulty in walking, not elsewhere classified (R26.2);Pain Pain - Right/Left: (midline) Pain - part of body: (back)    Time: [id number] PT Time Calculation (min) (ACUTE ONLY): 30 min   Charges:   PT Evaluation $PT Eval Low Complexity: 1 Low PT Treatments $Gait Training: 8-22 mins        Elizabeth PalauStephanie Anakin Varkey, PT, DPT [phone]   Zamiyah Resendes 03/07/2019, 11:37 AM

## 2019-03-07 NOTE — Progress Notes (Signed)
Patient OOB to the bathroom. Tolerated well.

## 2019-03-07 NOTE — Progress Notes (Signed)
Procedure: L4-5 XLIF with L4-5 extradural mass removal Procedure date: 03/06/2019 Diagnosis: Synovial cyst of lumbar spine  History: Nancy Thornton is s/p L4-5 XLIF with L4-5 extradural mass removal for synovial cyst in the lumbar spine.  POD1: Recovering well.  Symptoms that were present prior to surgery have significantly improved.  Complains of lumbar discomfort but denies any lower extremity complaints at this time.  She has been ambulate ambulate to bathroom and voiding without issue.  Tolerating solid food without issue.  Catheter removed this a.m.  POD0: Tolerated procedure well.  Evaluated in postop recovery still disoriented from anesthesia.  Complains of back soreness currently rated 8/10.  Denies any lower extremity symptoms such as pain/numbness/tingling.  Physical Exam: Vitals:   03/07/19 0745 03/07/19 0830  BP: 101/67 126/70  Pulse: 85 85  Resp: 18 19  Temp: 98.2 F (36.8 C) 98.2 F (36.8 C)  SpO2: 94% 97%    Strength: 5/5 throughout lower extremities Sensation: Intact and symmetric throughout lower extremities  Data:  Recent Labs  Lab 03/01/19 1124  NA 140  K 4.3  CL 100  CO2 29  BUN 18  CREATININE 0.67  GLUCOSE 152*  CALCIUM 9.3   No results for input(s): AST, ALT, ALKPHOS in the last 168 hours.  Invalid input(s): TBILI   Recent Labs  Lab 03/01/19 1124  WBC 8.3  HGB 14.1  HCT 39.6  PLT 250   Recent Labs  Lab 03/01/19 1124  APTT 25  INR 0.9         Other tests/results:  EXAM: LUMBAR SPINE - 2-3 VIEW 03/07/2019  COMPARISON:  Fluoroscopic images of March 06, 2019.  FINDINGS: The patient is status post surgical posterior fusion of L4-5 with bilateral intrapedicular screw placement and interbody fusion. Good alignment of vertebral bodies is noted. Mild degenerative disc disease is noted at L1-2, L2-3 and L3-4.  IMPRESSION: Status post surgical posterior fusion of L4-5.  Assessment/Plan:  Nancy Thornton is POD 1 status post  L4-5 lumbar fusion with removal of L4-5 extradural mass.  We will continue to monitor  - mobilize - pain control - DVT prophylaxis - PTOT  Marin Olp PA-C Department of Neurosurgery

## 2019-03-08 LAB — SURGICAL PATHOLOGY

## 2019-03-08 LAB — GLUCOSE, CAPILLARY
Glucose-Capillary: 155 mg/dL — ABNORMAL HIGH (ref 70–99)
Glucose-Capillary: 211 mg/dL — ABNORMAL HIGH (ref 70–99)

## 2019-03-08 MED ORDER — OXYCODONE HCL 5 MG PO TABS: 5.0000 mg | ORAL_TABLET | ORAL | 0 refills | Status: DC | PRN

## 2019-03-08 MED ORDER — CELECOXIB 100 MG PO CAPS: 100.0000 mg | ORAL_CAPSULE | Freq: Two times a day (BID) | ORAL | 0 refills | Status: DC

## 2019-03-08 MED ORDER — METHOCARBAMOL 500 MG PO TABS: 500.0000 mg | ORAL_TABLET | Freq: Four times a day (QID) | ORAL | 0 refills | Status: DC

## 2019-03-08 NOTE — Progress Notes (Signed)
Occupational Therapy Treatment Patient Details Name: Nancy Thornton MRN: [id number] DOB: 1961/07/02 Today's Date: 03/08/2019    History of present illness Pt admitted for L4/L5 fusion with excision of cyst. History includes asthma, depression, DM, and alcohol abuse.   OT comments  Pt seen to practice reacher for LB dressing for donning underwear and shorts with mod cues needed for set up and to dress L side first since this side is more sore and painful today compared to yesterday.  Strongly rec she purchase a reacher for home use and tie a bag on FWW to carry items.  Also discussed care of litter box and not to lean forward to open/close chicken coop at home and to ask her duaghter to help with this and cleaning litter box especially with adding or cleaning litter box since she cannot lift a box of litter for 6 weeks.  Reviewed precautions and rec before going home today.  Will contact PT to check height of FWW that she is taking home.  Rec OT HH to help with safe set up at home, ADL training and care of pets. Will discuss rec with CM/SW.  Follow Up Recommendations  Home health OT    Equipment Recommendations       Recommendations for Other Services      Precautions / Restrictions Precautions Precautions: Back Restrictions Weight Bearing Restrictions: No       Mobility Bed Mobility                  Transfers                      Balance                                           ADL either performed or assessed with clinical judgement   ADL Overall ADL's : Needs assistance/impaired                                       General ADL Comments: Pt seen to practice reacher for LB dressing for donning underwear and shorts with mod cues needed for set up and to dress L side first since this side is more sore and painful today compared to yesterday.  Strongly rec she purchase a reacher for home use and tie a bag on FWW to carry items.   Also discussed care of litter box and not to lean forward to open/close chicken coop at home and to ask her duaghter to help with this and cleaning litter box especially with adding or cleaning litter box since she cannot lift a box of litter for 6 weeks.  Reviewed precautions and rec before going home today.  Will contact PT to check height of FWW that she is taking home.     Vision       Perception     Praxis      Cognition Arousal/Alertness: Awake/alert Behavior During Therapy: WFL for tasks assessed/performed Overall Cognitive Status: Within Functional Limits for tasks assessed                                          Exercises  Shoulder Instructions       General Comments      Pertinent Vitals/ Pain       Pain Assessment: 0-10 Pain Score: 4  Pain Location: left side of back and hip Pain Descriptors / Indicators: Aching;Burning;Sore Pain Intervention(s): Limited activity within patient's tolerance;Monitored during session;Premedicated before session;Repositioned  Home Living                                          Prior Functioning/Environment              Frequency  Min 1X/week        Progress Toward Goals  OT Goals(current goals can now be found in the care plan section)  Progress towards OT goals: Progressing toward goals  Acute Rehab OT Goals Patient Stated Goal: to go home OT Goal Formulation: With patient Time For Goal Achievement: 03/14/19 Potential to Achieve Goals: Good  Plan Discharge plan remains appropriate    Co-evaluation                 AM-PAC OT "6 Clicks" Daily Activity     Outcome Measure   Help from another person eating meals?: None Help from another person taking care of personal grooming?: None Help from another person toileting, which includes using toliet, bedpan, or urinal?: A Little Help from another person bathing (including washing, rinsing, drying)?: A Little Help from  another person to put on and taking off regular upper body clothing?: None Help from another person to put on and taking off regular lower body clothing?: A Little 6 Click Score: 21    End of Session Equipment Utilized During Treatment: Gait belt  OT Visit Diagnosis: Pain;Other abnormalities of gait and mobility (R26.89) Pain - Right/Left: Left Pain - part of body: (back)   Activity Tolerance Patient tolerated treatment well   Patient Left in chair;with call bell/phone within reach   Nurse Communication          Time: 7096-2836 OT Time Calculation (min): 25 min  Charges: OT General Charges $OT Visit: 1 Visit OT Treatments $Self Care/Home Management : 23-37 mins  Chrys Racer, OTR/L, [place] ascom [phone] 03/08/19, 10:30 AM

## 2019-03-08 NOTE — Discharge Instructions (Signed)
Your surgeon has performed an operation on your lumbar spine (low back) to fuse two or more of the vertebrae (bones) together. This procedure is performed to treat a number of different spinal problems, including narrowing of the spinal canal (stenosis), herniated discs, degenerative changes, and injuries.  ° °Many times, patients feel better immediately after surgery and can "overdo it." Even if you feel well, it is important that you follow these activity guidelines. If you do not let your back heal properly from the surgery, you can increase the chance of return of your symptoms and other complications. The following are instructions to help in your recovery once you have been discharged from the hospital.  ° °* Do not take anti-inflammatory medications for 3 months after surgery (naproxen [Aleve], ibuprofen [Advil, Motrin], etc.). These medications can prevent your bones from healing properly.  ° °Activity  °   °No bending, lifting, or twisting ("BLT"). Avoid lifting objects heavier than 10 pounds (gallon milk jug).  Where possible, avoid household activities that involve lifting, bending, reaching, pushing, or pulling such as laundry, vacuuming, grocery shopping, and childcare. Try to arrange for help from friends and family for these activities while your back heals.  ° °Increase physical activity slowly as tolerated.  Taking short walks is encouraged, but avoid strenuous exercise. Do not jog, run, bicycle, lift weights, or participate in any other exercises unless specifically allowed by your doctor. Avoid prolonged sitting, including car rides.  ° °Talk to your doctor before resuming sexual activity.  ° °You should not drive until cleared by your doctor.  ° °Until released by your doctor, you should not return to work or school.  You should rest at home and let your body heal.  ° °You may shower three days after your surgery.  After showering, lightly dab your incision dry. Do not take a tub bath or go  swimming until approved by your doctor at your follow-up appointment.  ° °If your doctor ordered a lumbar brace for you, you should wear it whenever you are out of bed. You may remove it when lying down or sleeping. You should also wear it when riding in a car. Not all back surgeries require a lumbar brace.  ° °If you smoke, we strongly recommend that you quit.  Smoking has been proven to interfere with normal bone healing and will dramatically reduce the success rate of your surgery. Please contact [facility] (800-QUIT-NOW) and use the resources at www.[facility].com for assistance in stopping smoking.  ° °Surgical Incision  ° °If you have a dressing on your incision, you may remove it two days after your surgery. Keep your incision area clean and dry.  ° °If you have staples or stitches on your incision, you should have a follow up scheduled for removal. If you do not have staples or stitches, you will have steri-strips (small pieces of surgical tape) or Dermabond glue. The steri-strips/glue should begin to peel away within about a week (it is fine if the steri-strips fall off before then). If the strips are still in place one week after your surgery, you may gently remove them.  ° °Diet          ° ° You may return to your usual diet. Be sure to stay hydrated.  ° °When to Contact Us  ° °Although your surgery and recovery will likely be uneventful, you may have some residual numbness, aches, and pains in your back and/or legs. This is normal and should improve in   the next few weeks.  ° °However, should you experience any of the following, contact us immediately:  ° - New numbness or weakness  ° - Pain that is progressively getting worse, and is not relieved by your pain medications or rest  ° - Bleeding, redness, swelling, pain, or drainage from surgical incision  ° - Chills or flu-like symptoms  ° - Fever greater than 101.0 F (38.3 C)  ° - Problems with bowel or bladder functions  ° - Difficulty breathing or  shortness of breath  ° - Warmth, tenderness, or swelling in your calf  °Contact Information  ° - During office hours (Monday-Friday 9 am to 5 pm), please call your physician at [phone] ([place])  ° - After hours and weekends, please call the [facility] at [phone] and ask for the Neurosurgery Resident On Call  ° - For a life-threatening emergency, call 911  ° °

## 2019-03-08 NOTE — Discharge Summary (Signed)
Procedure: L4-5 XLIF with L4-5 extradural mass removal Procedure date: 03/06/2019 Diagnosis: Synovial cyst of lumbar spine  History: Nancy Thornton is s/p L4-5 XLIF with L4-5 extradural mass removal for synovial cyst in the lumbar spine.   POD2: Continues to do well. Complains of aching in left lateral thigh but this is improving. Back pain minimal and adequately controlled with current pain regimen.Ambulating, eating, and voiding without issue.   POD1: Recovering well.  Symptoms that were present prior to surgery have significantly improved.  Complains of lumbar discomfort but denies any lower extremity complaints at this time.  She has been ambulate ambulate to bathroom and voiding without issue.  Tolerating solid food without issue.  Catheter removed this a.m.  POD0: Tolerated procedure well.  Evaluated in postop recovery still disoriented from anesthesia.  Complains of back soreness currently rated 8/10.  Denies any lower extremity symptoms such as pain/numbness/tingling.  Physical Exam: Vitals:   03/08/19 0004 03/08/19 0801  BP: (!) 116/98 115/63  Pulse: 70 (!) 56  Resp: 19   Temp: 97.7 F (36.5 C) 97.7 F (36.5 C)  SpO2: 98% 97%    Strength: 5/5 throughout lower extremities Sensation: Intact and symmetric throughout lower extremities Skin: staples intact at lumbar sites. No active bleeding or significant swelling. Glue intact at lateral incision site.   Data:  Recent Labs  Lab 03/01/19 1124  NA 140  K 4.3  CL 100  CO2 29  BUN 18  CREATININE 0.67  GLUCOSE 152*  CALCIUM 9.3   No results for input(s): AST, ALT, ALKPHOS in the last 168 hours.  Invalid input(s): TBILI   Recent Labs  Lab 03/01/19 1124  WBC 8.3  HGB 14.1  HCT 39.6  PLT 250   Recent Labs  Lab 03/01/19 1124  APTT 25  INR 0.9         Other tests/results:  EXAM: LUMBAR SPINE - 2-3 VIEW 03/07/2019  COMPARISON:  Fluoroscopic images of March 06, 2019.  FINDINGS: The patient is status  post surgical posterior fusion of L4-5 with bilateral intrapedicular screw placement and interbody fusion. Good alignment of vertebral bodies is noted. Mild degenerative disc disease is noted at L1-2, L2-3 and L3-4.  IMPRESSION: Status post surgical posterior fusion of L4-5.  Assessment/Plan:  Nancy Thornton is POD 2 status post L4-5 lumbar fusion with removal of L4-5 extradural mass.  Pain adequately controlled, and she is eating and voiding without issue. Will continue post op pain control with pain medication, and muscle relaxer. Provided celebrex and advised id any adverse reactions occur, she should discontinue medication. Discussed activity restrictions and wound care. She is scheduled to follow up in approximately 2 weeks to monitor progress. Advised to contact office if any questions or concerns arise before then.  Marin Olp PA-C Department of Neurosurgery

## 2019-03-08 NOTE — TOC Transition Note (Signed)
Transition of Care [facility]) - CM/SW Discharge Note   Patient Details  Name: Nancy Thornton MRN: [id number] Date of Birth: [birth year]  Transition of Care [facility]) CM/SW Contact:  Su Hilt, RN Phone Number: 03/08/2019, 10:33 AM   Clinical Narrative:    Patient going home today and will do Outpatient PT, RW in the room, Ready for DC   Final next level of care: OP Rehab Barriers to Discharge: Barriers Resolved   Patient Goals and CMS Choice        Discharge Placement                       Discharge Plan and Services                                     Social Determinants of Health (SDOH) Interventions     Readmission Risk Interventions No flowsheet data found.

## 2019-03-08 NOTE — Progress Notes (Signed)
Physical Therapy Treatment Patient Details Name: Nancy Thornton MRN: [id number] DOB: 30-Oct-1960 Today's Date: 03/08/2019    History of Present Illness Pt admitted for L4/L5 fusion with excision of cyst. History includes asthma, depression, DM, and alcohol abuse.    PT Comments    Pt is making good progress towards goals. Reviewed log roll technique and gave written HEP. Reviewed precautions and answered all questions. Pt ready for dc this date.    Follow Up Recommendations  Outpatient PT     Equipment Recommendations  Rolling walker with 5" wheels    Recommendations for Other Services       Precautions / Restrictions Precautions Precautions: Back Precaution Booklet Issued: Yes (comment) Restrictions Weight Bearing Restrictions: No    Mobility  Bed Mobility Overal bed mobility: Independent             General bed mobility comments: reviewed back precautions with all mobility. Practiced log roll for supine<>sit. Safe technique performed  Transfers Overall transfer level: Needs assistance Equipment used: Rolling walker (2 wheeled) Transfers: Sit to/from Stand Sit to Stand: Supervision         General transfer comment: safe technique with cues to push from seated surface.  Ambulation/Gait Ambulation/Gait assistance: Supervision Gait Distance (Feet): 40 Feet Assistive device: Rolling walker (2 wheeled) Gait Pattern/deviations: Step-through pattern     General Gait Details: ambulated in room with safe technique.   Stairs             Wheelchair Mobility    Modified Rankin (Stroke Patients Only)       Balance Overall balance assessment: Needs assistance Sitting-balance support: Feet supported Sitting balance-Leahy Scale: Good     Standing balance support: Bilateral upper extremity supported Standing balance-Leahy Scale: Good                              Cognition Arousal/Alertness: Awake/alert Behavior During Therapy: WFL for  tasks assessed/performed Overall Cognitive Status: Within Functional Limits for tasks assessed                                        Exercises      General Comments        Pertinent Vitals/Pain Pain Assessment: Faces Pain Score: 4  Faces Pain Scale: Hurts a little bit Pain Location: left side of back and hip Pain Descriptors / Indicators: Aching;Burning;Sore Pain Intervention(s): Limited activity within patient's tolerance    Home Living                      Prior Function            PT Goals (current goals can now be found in the care plan section) Acute Rehab PT Goals Patient Stated Goal: to go home PT Goal Formulation: With patient Time For Goal Achievement: 03/21/19 Potential to Achieve Goals: Good Progress towards PT goals: Progressing toward goals    Frequency    7X/week      PT Plan Current plan remains appropriate    Co-evaluation              AM-PAC PT "6 Clicks" Mobility   Outcome Measure  Help needed turning from your back to your side while in a flat bed without using bedrails?: None Help needed moving from lying on your back to sitting on the side of  a flat bed without using bedrails?: None Help needed moving to and from a bed to a chair (including a wheelchair)?: None Help needed standing up from a chair using your arms (e.g., wheelchair or bedside chair)?: None Help needed to walk in hospital room?: None Help needed climbing 3-5 steps with a railing? : A Little 6 Click Score: 23    End of Session   Activity Tolerance: Patient tolerated treatment well Patient left: in chair Nurse Communication: Mobility status PT Visit Diagnosis: Difficulty in walking, not elsewhere classified (R26.2);Pain     Time: [id number] PT Time Calculation (min) (ACUTE ONLY): 24 min  Charges:  $Gait Training: 8-22 mins $Therapeutic Activity: 8-22 mins                     Elizabeth PalauStephanie Audray Rumore, PT,  DPT [phone]    Yuette Putnam 03/08/2019, 12:59 PM

## 2019-03-09 ENCOUNTER — Other Ambulatory Visit: Payer: Self-pay

## 2019-03-09 ENCOUNTER — Encounter (HOSPITAL_COMMUNITY): Payer: Self-pay | Admitting: Emergency Medicine

## 2019-03-09 ENCOUNTER — Emergency Department (HOSPITAL_COMMUNITY)
Admission: EM | Admit: 2019-03-09 | Discharge: 2019-03-09 | Disposition: A | Discharge status: Home / Self Care | Payer: PRIVATE HEALTH INSURANCE | Attending: Emergency Medicine | Admitting: Emergency Medicine

## 2019-03-09 DIAGNOSIS — M545 Low back pain: Secondary | ICD-10-CM | POA: Diagnosis present

## 2019-03-09 DIAGNOSIS — K5903 Drug induced constipation: Secondary | ICD-10-CM | POA: Diagnosis not present

## 2019-03-09 DIAGNOSIS — Z7984 Long term (current) use of oral hypoglycemic drugs: Secondary | ICD-10-CM | POA: Diagnosis not present

## 2019-03-09 DIAGNOSIS — G8918 Other acute postprocedural pain: Secondary | ICD-10-CM | POA: Diagnosis not present

## 2019-03-09 DIAGNOSIS — E119 Type 2 diabetes mellitus without complications: Secondary | ICD-10-CM | POA: Insufficient documentation

## 2019-03-09 DIAGNOSIS — I10 Essential (primary) hypertension: Secondary | ICD-10-CM | POA: Diagnosis not present

## 2019-03-09 DIAGNOSIS — M549 Dorsalgia, unspecified: Secondary | ICD-10-CM

## 2019-03-09 DIAGNOSIS — Z79899 Other long term (current) drug therapy: Secondary | ICD-10-CM | POA: Diagnosis not present

## 2019-03-09 DIAGNOSIS — J45909 Unspecified asthma, uncomplicated: Secondary | ICD-10-CM | POA: Diagnosis not present

## 2019-03-09 DIAGNOSIS — Z87891 Personal history of nicotine dependence: Secondary | ICD-10-CM | POA: Diagnosis not present

## 2019-03-09 MED ORDER — HYDROMORPHONE HCL 2 MG/ML IJ SOLN
2.0000 mg | Freq: Once | INTRAMUSCULAR | Status: AC
  Administered 2019-03-09: 2 mg via INTRAMUSCULAR
  Filled 2019-03-09: qty 1

## 2019-03-09 MED ORDER — HYDROMORPHONE HCL 1 MG/ML IJ SOLN
1.0000 mg | Freq: Once | INTRAMUSCULAR | Status: AC
  Administered 2019-03-09: 13:00:00 1 mg via INTRAMUSCULAR
  Filled 2019-03-09: qty 1

## 2019-03-09 MED ORDER — LORAZEPAM 2 MG/ML IJ SOLN
2.0000 mg | Freq: Once | INTRAMUSCULAR | Status: AC
  Administered 2019-03-09: 2 mg via INTRAMUSCULAR
  Filled 2019-03-09: qty 1

## 2019-03-09 MED ORDER — KETOROLAC TROMETHAMINE 30 MG/ML IJ SOLN
30.0000 mg | Freq: Once | INTRAMUSCULAR | Status: AC
  Administered 2019-03-09: 30 mg via INTRAMUSCULAR
  Filled 2019-03-09: qty 1

## 2019-03-09 NOTE — ED Triage Notes (Signed)
Pt reports having back pain from recent surgery on waking this morning. Took pain medication at 0600.

## 2019-03-09 NOTE — ED Provider Notes (Signed)
[facility] EMERGENCY DEPARTMENT Provider Note   CSN: [id number] Arrival date & time: 03/09/19  0710     History   Chief Complaint Chief Complaint  Patient presents with  . Back Pain    HPI Nancy Thornton is a 58 y.o. female.     HPI  She presents for evaluation of severe low back pain radiating to both legs, unrelenting and not resolved with medications prescribed at hospital discharge yesterday.  She had lumbar fusion surgery L4-5, on 03/06/2019.  She is discharged in improved and stable condition yesterday.  She denies fever, cough, shortness of breath, focal weakness or paresthesia.  She required EMS transport to the emergency department today.  There are no other known modifying factors.  Past Medical History:  Diagnosis Date  . Anal fissure    history of  . Asthma   . Depression   . Diabetes mellitus without complication (HCC)   . DM (diabetes mellitus) (HCC) 10/19/2012  . Hypertension   . Lichen    hands and feet  . Obesity   . 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 S/P lumbar fusion 03/06/2019  . Tobacco use 07/10/2018  . Morbid obesity (HCC) 07/10/2018  . 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 Smokes 02/20/2018  . History of arthroscopy of knee 09/26/2017  . Simple chronic bronchitis (HCC) 10/28/2014  . Hypertension associated with diabetes (HCC) 10/19/2012  . Type 2 diabetes mellitus with hyperglycemia, without long-term current use of insulin (HCC) 10/19/2012  . Hyperlipidemia associated with type 2 diabetes mellitus (HCC) 10/19/2012    Past Surgical History:  Procedure Laterality Date  . ANTERIOR LUMBAR FUSION N/A 03/06/2019   Procedure: L4-5 XLIF, L4-5 PSF, LEFT SYNOVIAL CYST RESECTION L4-5;  Surgeon: Venetia Night, MD;  Location: [facility] ORS;  Service: Neurosurgery;  Laterality: N/A;  . BACK SURGERY    . CHOLECYSTECTOMY    . CYSTOSTOMY W/ BLADDER BIOPSY  1990   punctured  hole in bladder, had open procedure to repair  . MENISCUS REPAIR    . TONSILLECTOMY    . TOTAL VAGINAL HYSTERECTOMY     58 y/o. bleeding. ovaries left in situ     OB History    Gravida  2   Para  2   Term      Preterm      AB      Living        SAB      TAB      Ectopic      Multiple      Live Births           Obstetric Comments  SVD x 2         Home Medications    Prior to Admission medications   Medication Sig Start Date End Date Taking? Authorizing Provider  acetaminophen (TYLENOL) 500 MG tablet Take 1,000 mg by mouth every 6 (six) hours as needed (pain.).   Yes [provider]  albuterol (PROAIR HFA) 108 (90 Base) MCG/ACT inhaler Inhale 2 puffs into the lungs every 6 (six) hours as needed for wheezing or shortness of breath. 08/29/18  Yes Delynn Flavin M, DO  amLODipine (NORVASC) 2.5 MG tablet Take 1 tablet by mouth once daily 12/17/18  Yes Pricilla Riffle, MD  atenolol (TENORMIN) 50 MG tablet Take 1 tablet (50 mg total) by mouth daily. 07/10/18  Yes Delynn Flavin  M, DO  atorvastatin (LIPITOR) 10 MG tablet Take 1 tablet by mouth once daily Patient taking differently: Take 10 mg by mouth daily.  12/17/18  Yes Pricilla Riffleoss, Paula V, MD  celecoxib (CELEBREX) 100 MG capsule Take 1 capsule (100 mg total) by mouth 2 (two) times daily. 03/08/19  Yes Ivar DrapeFerri, Amanda, PA-C  gabapentin (NEURONTIN) 300 MG capsule Take 1 capsule (300 mg total) by mouth 2 (two) times daily. Patient taking differently: Take 300 mg by mouth 3 (three) times daily as needed (pain.).  08/29/18  Yes Gottschalk, Ashly M, DO  lisinopril-hydrochlorothiazide (PRINZIDE,ZESTORETIC) 20-25 MG tablet Take 1 tablet by mouth daily. 07/10/18  Yes Gottschalk, Kathie RhodesAshly M, DO  Melatonin 5 MG TABS Take 5 mg by mouth at bedtime.   Yes [provider]  metFORMIN (GLUCOPHAGE) 1000 MG tablet Take 1 tablet (1,000 mg total) by mouth 2 (two) times daily with a meal. 01/08/19  Yes Gottschalk, Ashly M, DO   methocarbamol (ROBAXIN) 500 MG tablet Take 1-1.5 tablets (500-750 mg total) by mouth every 6 (six) hours. 03/08/19  Yes Ivar DrapeFerri, Amanda, PA-C  oxyCODONE (OXY IR/ROXICODONE) 5 MG immediate release tablet Take 1 tablet (5 mg total) by mouth every 3 (three) hours as needed for moderate pain ((score 4 to 6)). 03/08/19  Yes Ivar DrapeFerri, Amanda, PA-C  budesonide-formoterol (SYMBICORT) 160-4.5 MCG/ACT inhaler Inhale 2 puffs into the lungs 2 (two) times daily. 08/29/18   Raliegh IpGottschalk, Ashly M, DO  LORazepam (ATIVAN) 0.5 MG tablet Take 1 tablet 1 hour before scheduled procedure.  May repeat 1 time if needed. Patient not taking: Reported on 03/01/2019 01/08/19   Raliegh IpGottschalk, Ashly M, DO    Family History Family History  Problem Relation Age of Onset  . Diabetes Mother   . Hypertension Father   . Colon cancer Neg Hx   . Esophageal cancer Neg Hx   . Rectal cancer Neg Hx   . Stomach cancer Neg Hx     Social History Social History   Tobacco Use  . Smoking status: Former Smoker    Packs/day: 0.50    Types: Cigarettes    Start date: 10/18/1977    Quit date: 02/10/2019    Years since quitting: 0.0  . Smokeless tobacco: Never Used  Substance Use Topics  . Alcohol use: No  . Drug use: No     Allergies   Penicillins, Meloxicam, and Bactrim [sulfamethoxazole-trimethoprim]   Review of Systems Review of Systems  All other systems reviewed and are negative.    Physical Exam Updated Vital Signs BP 117/72   Pulse 75   Resp 11   Ht 5\' 6"  (1.676 m)   Wt 100.7 kg   SpO2 95%   BMI 35.83 kg/m   Physical Exam Vitals signs and nursing note reviewed.  Constitutional:      General: She is in acute distress (Uncomfortable).     Appearance: She is well-developed. She is obese. She is not ill-appearing, toxic-appearing or diaphoretic.  HENT:     Head: Normocephalic and atraumatic.     Right Ear: External ear normal.     Left Ear: External ear normal.  Eyes:     Conjunctiva/sclera: Conjunctivae normal.      Pupils: Pupils are equal, round, and reactive to light.  Neck:     Musculoskeletal: Normal range of motion and neck supple. No neck rigidity.     Trachea: Phonation normal.  Cardiovascular:     Rate and Rhythm: Normal rate.  Pulmonary:     Effort: Pulmonary  effort is normal.  Abdominal:     Palpations: Abdomen is soft.     Tenderness: There is no abdominal tenderness.  Musculoskeletal:     Comments: She guards against movement of lower back and leg secondary to pain.  Evaluation of surgical sites, left posterolateral pelvis and midline lower lumbar, with intact closure devices, approximated wound edges, and no associated drainage or bleeding.  Skin:    General: Skin is warm and dry.  Neurological:     Mental Status: She is alert and oriented to person, place, and time.     Cranial Nerves: No cranial nerve deficit.     Sensory: No sensory deficit.     Motor: No abnormal muscle tone.     Coordination: Coordination normal.  Psychiatric:        Behavior: Behavior normal.     Comments: Anxious, tearful      ED Treatments / Results  Labs (all labs ordered are listed, but only abnormal results are displayed) Labs Reviewed - No data to display  EKG None  Radiology No results found.  Procedures Procedures (including critical care time)  Medications Ordered in ED Medications  HYDROmorphone (DILAUDID) injection 1 mg (has no administration in time range)  HYDROmorphone (DILAUDID) injection 2 mg (2 mg Intramuscular Given 03/09/19 0814)  LORazepam (ATIVAN) injection 2 mg (2 mg Intramuscular Given 03/09/19 0815)  ketorolac (TORADOL) 30 MG/ML injection 30 mg (30 mg Intramuscular Given 03/09/19 0816)     Initial Impression / Assessment and Plan / ED Course  I have reviewed the triage vital signs and the nursing notes.  Pertinent labs & imaging results that were available during my care of the patient were reviewed by me and considered in my medical decision making (see chart for  details).  Clinical Course as of Mar 08 1246  Sat Mar 09, 2019  1242 Patient was initially better now feels the pain coming back somewhat.  She also complains of no bowel movement for 3 days and frequent urination recently.  She states she is able to move about in the bed better, and feels like she can go home.  She would like a little more medication prior to discharge.  She will have her sister come and get her.   [EW]    Clinical Course User Index [EW] Daleen Bo, MD        Patient Vitals for the past 24 hrs:  BP Temp src Pulse Resp SpO2 Height Weight  03/09/19 1200 - - 75 11 95 % - -  03/09/19 1145 - - 70 11 94 % - -  03/09/19 1130 - - 65 11 94 % - -  03/09/19 1115 - - 63 12 94 % - -  03/09/19 1100 - - 65 12 93 % - -  03/09/19 1045 - - 64 12 94 % - -  03/09/19 1030 - - 74 18 93 % - -  03/09/19 1015 - - 64 12 92 % - -  03/09/19 1000 - - 80 19 94 % - -  03/09/19 0930 117/72 - 63 11 91 % - -  03/09/19 0915 - - 67 12 90 % - -  03/09/19 0900 116/68 - 63 12 91 % - -  03/09/19 0845 - - 67 12 90 % - -  03/09/19 0830 102/88 - 73 16 96 % - -  03/09/19 0815 - - 76 17 97 % - -  03/09/19 0800 131/75 - 81 18 97 % - -  03/09/19 0745 - - 72 14 91 % - -  03/09/19 0730 135/72 - 73 16 91 % - -  03/09/19 0729 (!) 156/114 Oral 79 20 97 % - -  03/09/19 [id number] - - - - - 5\' 6"  (1.676 m) 100.7 kg    12:47 PM Reevaluation with update and discussion. After initial assessment and treatment, an updated evaluation reveals patient is overall better, following initial treatment, and stable for discharge.  Doubt significant postoperative complications/failure.  Possible constipation requiring more aggressive measures, recommendations given to patient.  Findings discussed and questions answered. Mancel BaleElliott Kimberely Mccannon   Medical Decision Making: Postoperative pain, post lumbar fusion.  Incidental constipation, possibly worsened from inactivity postoperatively.  Doubt surgical wound infection, lumbar myelopathy or  metabolic instability.  CRITICAL CARE-no Performed by: Mancel BaleElliott Tarry Blayney  Nursing Notes Reviewed/ Care Coordinated Applicable Imaging Reviewed Interpretation of Laboratory Data incorporated into ED treatment  The patient appears reasonably screened and/or stabilized for discharge and I doubt any other medical condition or other [facility] requiring further screening, evaluation, or treatment in the ED at this time prior to discharge.  Plan: Home Medications-continue usual medicines, use magnesium citrate and Colace for stool softener; Home Treatments-gradual advance activity; return here if the recommended treatment, does not improve the symptoms; Recommended follow up-neurosurgery follow-up if not better in 2 or 3 days.   Final Clinical Impressions(s) / ED Diagnoses   Final diagnoses:  Postoperative back pain  Drug-induced constipation    ED Discharge Orders    None       Mancel BaleWentz, Annaleigha Woo, MD 03/09/19 1248

## 2019-03-09 NOTE — Discharge Instructions (Addendum)
Take your pain medicine and muscle relaxer, as prescribed by your neurosurgeon.  To treat constipation, get a bottle of magnesium citrate, and drink it over 4 hours today.  After that take Colace, 100 mg twice a day for 1 month.  If you do not have a bowel movement within 2 days, use a fleets enema twice a day until you have a bowel movement.  Follow-up with your neurosurgeon for problems.

## 2019-04-02 ENCOUNTER — Telehealth: Payer: Self-pay | Admitting: *Deleted

## 2019-04-02 NOTE — Telephone Encounter (Signed)
Call placed to pt re: appt 04/05/2019 with Dr. Harrington Challenger Need to clarify if pt really needs 2 month f/u or 1 year per AVS? Also pt is a [place] pt, so need to confirm if this appt is needed. Left a message for pt to call back.

## 2019-04-03 ENCOUNTER — Encounter: Payer: Self-pay | Admitting: *Deleted

## 2019-04-03 NOTE — Telephone Encounter (Signed)
Pt called back.  Per Dr. Harrington Challenger, pt is to have a tele-health visit in October. Will leave appt.  Pt will do phone call. Sent consent via mychart. Pt also gave verbal     Virtual Visit Pre-Appointment Phone Call  "(Name), I am calling you today to discuss your upcoming appointment. We are currently trying to limit exposure to the virus that causes COVID-19 by seeing patients at home rather than in the office."  1. "What is the BEST phone number to call the day of the visit?" - include this in appointment notes  2. "Do you have or have access to (through a family member/friend) a smartphone with video capability that we can use for your visit?" a. If yes - list this number in appt notes as "cell" (if different from BEST phone #) and list the appointment type as a VIDEO visit in appointment notes b. If no - list the appointment type as a PHONE visit in appointment notes  3. Confirm consent - "In the setting of the current Covid19 crisis, you are scheduled for a (phone or video) visit with your provider on (date) at (time).  Just as we do with many in-office visits, in order for you to participate in this visit, we must obtain consent.  If you'd like, I can send this to your mychart (if signed up) or email for you to review.  Otherwise, I can obtain your verbal consent now.  All virtual visits are billed to your insurance company just like a normal visit would be.  By agreeing to a virtual visit, we'd like you to understand that the technology does not allow for your provider to perform an examination, and thus may limit your provider's ability to fully assess your condition. If your provider identifies any concerns that need to be evaluated in person, we will make arrangements to do so.  Finally, though the technology is pretty good, we cannot assure that it will always work on either your or our end, and in the setting of a video visit, we may have to convert it to a phone-only visit.  In either  situation, we cannot ensure that we have a secure connection.  Are you willing to proceed?" STAFF: Did the patient verbally acknowledge consent to telehealth visit? Document YES/NO here: YES  4. Advise patient to be prepared - "Two hours prior to your appointment, go ahead and check your blood pressure, pulse, oxygen saturation, and your weight (if you have the equipment to check those) and write them all down. When your visit starts, your provider will ask you for this information. If you have an Apple Watch or Kardia device, please plan to have heart rate information ready on the day of your appointment. Please have a pen and paper handy nearby the day of the visit as well."  5. Give patient instructions for MyChart download to smartphone OR Doximity/Doxy.me as below if video visit (depending on what platform provider is using)  6. Inform patient they will receive a phone call 15 minutes prior to their appointment time (may be from unknown caller ID) so they should be prepared to answer    TELEPHONE CALL NOTE  Nancy Thornton has been deemed a candidate for a follow-up tele-health visit to limit community exposure during the Covid-19 pandemic. I spoke with the patient via phone to ensure availability of phone/video source, confirm preferred email & phone number, and discuss instructions and expectations.  I reminded Nancy Thornton to be prepared with  any vital sign and/or heart rhythm information that could potentially be obtained via home monitoring, at the time of her visit. I reminded Nancy Thornton to expect a phone call prior to her visit.  Elliot CousinWitty, Shatara Stanek, RMA 04/03/2019 10:37 AM   INSTRUCTIONS FOR DOWNLOADING THE MYCHART APP TO SMARTPHONE  - The patient must first make sure to have activated MyChart and know their login information - If Apple, go to [employer] Store and type in MyChart in the search bar and download the app. If Android, ask patient to go to [employer] Play Store and type in CoolidgeMyChart in  the search bar and download the app. The app is free but as with any other app downloads, their phone may require them to verify saved payment information or Apple/Android password.  - The patient will need to then log into the app with their MyChart username and password, and select [facility] as their healthcare provider to link the account. When it is time for your visit, go to the MyChart app, find appointments, and click Begin Video Visit. Be sure to Select Allow for your device to access the Microphone and Camera for your visit. You will then be connected, and your provider will be with you shortly.  **If they have any issues connecting, or need assistance please contact MyChart service desk [phone])**  **If using a computer, in order to ensure the best quality for their visit they will need to use either of the following Internet Browsers: [employer] Edge, or Google Chrome**  IF USING DOXIMITY or DOXY.ME - The patient will receive a link just prior to their visit by text.     FULL LENGTH CONSENT FOR TELE-HEALTH VISIT   I hereby voluntarily request, consent and authorize [facility] and its employed or contracted physicians, physician assistants, nurse practitioners or other licensed health care professionals (the Practitioner), to provide me with telemedicine health care services (the "Services") as deemed necessary by the treating Practitioner. I acknowledge and consent to receive the Services by the Practitioner via telemedicine. I understand that the telemedicine visit will involve communicating with the Practitioner through live audiovisual communication technology and the disclosure of certain medical information by electronic transmission. I acknowledge that I have been given the opportunity to request an in-person assessment or other available alternative prior to the telemedicine visit and am voluntarily participating in the telemedicine visit.  I understand that  I have the right to withhold or withdraw my consent to the use of telemedicine in the course of my care at any time, without affecting my right to future care or treatment, and that the Practitioner or I may terminate the telemedicine visit at any time. I understand that I have the right to inspect all information obtained and/or recorded in the course of the telemedicine visit and may receive copies of available information for a reasonable fee.  I understand that some of the potential risks of receiving the Services via telemedicine include:  Marland Kitchen. Delay or interruption in medical evaluation due to technological equipment failure or disruption; . Information transmitted may not be sufficient (e.g. poor resolution of images) to allow for appropriate medical decision making by the Practitioner; and/or  . In rare instances, security protocols could fail, causing a breach of personal health information.  Furthermore, I acknowledge that it is my responsibility to provide information about my medical history, conditions and care that is complete and accurate to the best of my ability. I acknowledge that Practitioner's advice, recommendations, and/or decision  may be based on factors not within their control, such as incomplete or inaccurate data provided by me or distortions of diagnostic images or specimens that may result from electronic transmissions. I understand that the practice of medicine is not an exact science and that Practitioner makes no warranties or guarantees regarding treatment outcomes. I acknowledge that I will receive a copy of this consent concurrently upon execution via email to the email address I last provided but may also request a printed copy by calling the office of [facility].    I understand that my insurance will be billed for this visit.   I have read or had this consent read to me. . I understand the contents of this consent, which adequately explains the benefits and risks of  the Services being provided via telemedicine.  . I have been provided ample opportunity to ask questions regarding this consent and the Services and have had my questions answered to my satisfaction. . I give my informed consent for the services to be provided through the use of telemedicine in my medical care  By participating in this telemedicine visit I agree to the above.

## 2019-04-03 NOTE — Telephone Encounter (Signed)
New Message ° ° ° ° °Patient returning your call please call her back. °

## 2019-04-05 ENCOUNTER — Encounter: Payer: Self-pay | Admitting: Internal Medicine

## 2019-04-05 ENCOUNTER — Other Ambulatory Visit: Payer: Self-pay

## 2019-04-05 ENCOUNTER — Telehealth (INDEPENDENT_AMBULATORY_CARE_PROVIDER_SITE_OTHER): Payer: PRIVATE HEALTH INSURANCE | Admitting: Internal Medicine

## 2019-04-05 DIAGNOSIS — I10 Essential (primary) hypertension: Secondary | ICD-10-CM

## 2019-04-05 DIAGNOSIS — E785 Hyperlipidemia, unspecified: Secondary | ICD-10-CM | POA: Diagnosis not present

## 2019-04-05 NOTE — Patient Instructions (Signed)
Medication Instructions:  The current medical regimen is effective;  continue present plan and medications.  If you need a refill on your cardiac medications before your next appointment, please call your pharmacy.   Follow-Up: At [facility], you and your health needs are our priority.  As part of our continuing mission to provide you with exceptional heart care, we have created designated Provider Care Teams.  These Care Teams include your primary Cardiologist (physician) and Advanced Practice Providers (APPs -  Physician Assistants and Nurse Practitioners) who all work together to provide you with the care you need, when you need it. You will need a follow up appointment in:  6 months.  Please call our office 2 months in advance to schedule this appointment.  You may see Dorris Carnes, MD or one of the following Advanced Practice Providers on your designated Care Team: Richardson Dopp, PA-C [place], [place] . Daune Perch, NP  Thank you for choosing [facility]!!

## 2019-04-05 NOTE — Progress Notes (Signed)
Virtual Visit via Telephone Note   This visit type was conducted due to national recommendations for restrictions regarding the COVID-19 Pandemic (e.g. social distancing) in an effort to limit this patient's exposure and mitigate transmission in our community.  Due to her co-morbid illnesses, this patient is at least at moderate risk for complications without adequate follow up.  This format is felt to be most appropriate for this patient at this time.  The patient did not have access to video technology/had technical difficulties with video requiring transitioning to audio format only (telephone).  All issues noted in this document were discussed and addressed.  No physical exam could be performed with this format.  Please refer to the patient's chart for her  consent to telehealth for [facility].   Date:  04/05/2019   ID:  Nancy Thornton, DOB Sep 12, 1960, MRN [id number]  Patient Location: Home Provider Location: Home  PCP:  Raliegh Ip, DO  Cardiologist:  Dietrich Pates, MD    Chief Complaint:    History of Present Illness:    Nancy Thornton is a 58 y.o. female with HTN, HL, Type II DM, atherosclerosis (by CT) of aorta   ALso a history of CP (atypical)   I saw her in clinic back in August 2020   BP was OK at that time    The pt denies CP    BP has been 120s to 140s   SHe is taking meds a little different   The patient does not have symptoms concerning for COVID-19 infection (fever, chills, cough, or new shortness of breath).    Past Medical History:  Diagnosis Date  . Anal fissure    history of  . Asthma   . Depression   . Diabetes mellitus without complication (HCC)   . DM (diabetes mellitus) (HCC) 10/19/2012  . Hypertension   . Lichen    hands and feet  . Obesity   . 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 ANTERIOR LUMBAR FUSION N/A 03/06/2019   Procedure: L4-5 XLIF, L4-5 PSF, LEFT  SYNOVIAL CYST RESECTION L4-5;  Surgeon: Venetia Night, MD;  Location: [facility] ORS;  Service: Neurosurgery;  Laterality: N/A;  . BACK SURGERY    . CHOLECYSTECTOMY    . CYSTOSTOMY W/ BLADDER BIOPSY  1990   punctured hole in bladder, had open procedure to repair  . MENISCUS REPAIR    . TONSILLECTOMY    . TOTAL VAGINAL HYSTERECTOMY     58 y/o. bleeding. ovaries left in situ     Current Meds  Medication Sig  . acetaminophen (TYLENOL) 500 MG tablet Take 1,000 mg by mouth every 6 (six) hours as needed (pain.).  Marland Kitchen albuterol (PROAIR HFA) 108 (90 Base) MCG/ACT inhaler Inhale 2 puffs into the lungs every 6 (six) hours as needed for wheezing or shortness of breath.  Marland Kitchen amLODipine (NORVASC) 2.5 MG tablet Take 1 tablet by mouth once daily  . atenolol (TENORMIN) 50 MG tablet Take 1 tablet (50 mg total) by mouth daily.  Marland Kitchen atorvastatin (LIPITOR) 10 MG tablet Take 1 tablet by mouth once daily  . budesonide-formoterol (SYMBICORT) 160-4.5 MCG/ACT inhaler Inhale 2 puffs into the lungs 2 (two) times daily.  . celecoxib (CELEBREX) 100 MG capsule Take 1 capsule (100 mg total) by mouth 2 (two) times daily.  Marland Kitchen gabapentin (NEURONTIN) 300 MG capsule Take 1 capsule (300 mg total) by mouth 2 (two)  times daily. (Patient taking differently: Take 300 mg by mouth 3 (three) times daily as needed (pain.). )  . lisinopril-hydrochlorothiazide (PRINZIDE,ZESTORETIC) 20-25 MG tablet Take 1 tablet by mouth daily.  Marland Kitchen. LORazepam (ATIVAN) 0.5 MG tablet Take 1 tablet 1 hour before scheduled procedure.  May repeat 1 time if needed.  . Melatonin 5 MG TABS Take 5 mg by mouth at bedtime.  . metFORMIN (GLUCOPHAGE) 1000 MG tablet Take 1 tablet (1,000 mg total) by mouth 2 (two) times daily with a meal.  . methocarbamol (ROBAXIN) 500 MG tablet Take 1-1.5 tablets (500-750 mg total) by mouth every 6 (six) hours.  Marland Kitchen. oxyCODONE (OXY IR/ROXICODONE) 5 MG immediate release tablet Take 1 tablet (5 mg total) by mouth every 3 (three) hours as needed for  moderate pain ((score 4 to 6)).  Marland Kitchen. tiZANidine (ZANAFLEX) 2 MG tablet Take by mouth.     Allergies:   Penicillins, Meloxicam, and Bactrim [sulfamethoxazole-trimethoprim]   Social History   Tobacco Use  . Smoking status: Light Tobacco Smoker    Packs/day: 0.50    Types: Cigarettes    Start date: 10/18/1977    Last attempt to quit: 02/10/2019    Years since quitting: 0.1  . Smokeless tobacco: Never Used  Substance Use Topics  . Alcohol use: No  . Drug use: No     Family Hx: The patient's family history includes Diabetes in her mother; Hypertension in her father. There is no history of Colon cancer, Esophageal cancer, Rectal cancer, or Stomach cancer.  ROS:   Please see the history of present illness.     All other systems reviewed and are negative.   Prior CV studies:   The following studies were reviewed today:    Labs/Other Tests and Data Reviewed:    EKG:  No ECG reviewed.  Recent Labs: 07/10/2018: TSH 2.630 03/01/2019: BUN 18; Creatinine, Ser 0.67; Hemoglobin 14.1; Platelets 250; Potassium 4.3; Sodium 140   Recent Lipid Panel Lab Results  Component Value Date/Time   CHOL 216 (H) 11/21/2014 04:19 PM   CHOL 196 01/17/2013 12:26 PM   TRIG 183 (H) 11/21/2014 04:19 PM   TRIG 233 (H) 09/05/2013 11:42 AM   TRIG 176 (H) 01/17/2013 12:26 PM   HDL 69 11/21/2014 04:19 PM   HDL 58 09/05/2013 11:42 AM   HDL 57 01/17/2013 12:26 PM   CHOLHDL 3.1 11/21/2014 04:19 PM   LDLCALC 110 (H) 11/21/2014 04:19 PM   LDLCALC 58 09/05/2013 11:42 AM   LDLCALC 104 (H) 01/17/2013 12:26 PM   LDLDIRECT 54 02/16/2018 09:03 AM    Wt Readings from Last 3 Encounters:  04/05/19 222 lb (100.7 kg)  03/09/19 222 lb 0.1 oz (100.7 kg)  03/06/19 222 lb (100.7 kg)     Objective:    Vital Signs:  BP (!) 146/93   Pulse 79   Ht 5\' 6"  (1.676 m)   Wt 222 lb (100.7 kg)   BMI 35.83 kg/m    VITAL SIGNS:  reviewed  ASSESSMENT & PLAN:    1. HTN  Pt reports overall BP is not bad  WOuld keep on this  regimen   I have told her to keep monitoring and call if BP above 140s      2  CP   PT denies    3  Atherosclerosis .   FOund on CT   No symptoms  4  HL   Keep on statin   COVID-19 Education: The signs and symptoms of COVID-19 were discussed  with the patient and how to seek care for testing (follow up with PCP or arrange E-visit).  The importance of social distancing was discussed today.  Time:   Today, I have spent 20 minutes with the patient with telehealth technology discussing the above problems.     Medication Adjustments/Labs and Tests Ordered: Current medicines are reviewed at length with the patient today.  Concerns regarding medicines are outlined above.   Tests Ordered: No orders of the defined types were placed in this encounter.   Medication Changes: No orders of the defined types were placed in this encounter.   Follow Up:   Signed, Dorris Carnes, MD  04/05/2019 9:31 AM    [place]

## 2019-04-11 ENCOUNTER — Other Ambulatory Visit: Payer: Self-pay

## 2019-04-12 ENCOUNTER — Ambulatory Visit (INDEPENDENT_AMBULATORY_CARE_PROVIDER_SITE_OTHER): Payer: PRIVATE HEALTH INSURANCE | Admitting: Family Medicine

## 2019-04-12 ENCOUNTER — Other Ambulatory Visit: Payer: Self-pay

## 2019-04-12 ENCOUNTER — Encounter: Payer: Self-pay | Admitting: Family Medicine

## 2019-04-12 VITALS — BP 121/83 | HR 67 | Temp 98.4°F | Ht 66.0 in | Wt 226.2 lb

## 2019-04-12 DIAGNOSIS — E1169 Type 2 diabetes mellitus with other specified complication: Secondary | ICD-10-CM | POA: Diagnosis not present

## 2019-04-12 DIAGNOSIS — E1159 Type 2 diabetes mellitus with other circulatory complications: Secondary | ICD-10-CM | POA: Diagnosis not present

## 2019-04-12 DIAGNOSIS — M549 Dorsalgia, unspecified: Secondary | ICD-10-CM | POA: Insufficient documentation

## 2019-04-12 DIAGNOSIS — I10 Essential (primary) hypertension: Secondary | ICD-10-CM

## 2019-04-12 DIAGNOSIS — E785 Hyperlipidemia, unspecified: Secondary | ICD-10-CM

## 2019-04-12 DIAGNOSIS — I152 Hypertension secondary to endocrine disorders: Secondary | ICD-10-CM

## 2019-04-12 DIAGNOSIS — Z9889 Other specified postprocedural states: Secondary | ICD-10-CM | POA: Diagnosis not present

## 2019-04-12 DIAGNOSIS — E1165 Type 2 diabetes mellitus with hyperglycemia: Secondary | ICD-10-CM
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
## 2019-04-22 ENCOUNTER — Other Ambulatory Visit: Payer: Self-pay

## 2019-04-22 ENCOUNTER — Encounter: Payer: PRIVATE HEALTH INSURANCE | Admitting: Family Medicine

## 2019-04-22 NOTE — Progress Notes (Signed)
This encounter was created in error - please disregard.

## 2019-04-24 ENCOUNTER — Other Ambulatory Visit: Payer: Self-pay

## 2019-04-24 ENCOUNTER — Ambulatory Visit (INDEPENDENT_AMBULATORY_CARE_PROVIDER_SITE_OTHER): Payer: PRIVATE HEALTH INSURANCE | Admitting: Family Medicine

## 2019-04-24 ENCOUNTER — Encounter: Payer: Self-pay | Admitting: Family Medicine

## 2019-04-24 ENCOUNTER — Ambulatory Visit (INDEPENDENT_AMBULATORY_CARE_PROVIDER_SITE_OTHER): Payer: PRIVATE HEALTH INSURANCE

## 2019-04-24 VITALS — BP 115/65 | HR 62 | Temp 97.1°F | Resp 20 | Ht 66.0 in | Wt 225.0 lb

## 2019-04-24 DIAGNOSIS — M549 Dorsalgia, unspecified: Secondary | ICD-10-CM | POA: Diagnosis not present

## 2019-04-24 DIAGNOSIS — Z9889 Other specified postprocedural states: Secondary | ICD-10-CM
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
## 2019-05-28 ENCOUNTER — Ambulatory Visit (INDEPENDENT_AMBULATORY_CARE_PROVIDER_SITE_OTHER): Payer: PRIVATE HEALTH INSURANCE | Admitting: Family Medicine

## 2019-05-28 ENCOUNTER — Ambulatory Visit (INDEPENDENT_AMBULATORY_CARE_PROVIDER_SITE_OTHER): Payer: PRIVATE HEALTH INSURANCE

## 2019-05-28 ENCOUNTER — Encounter: Payer: Self-pay | Admitting: Family Medicine

## 2019-05-28 VITALS — BP 143/85 | HR 75 | Temp 97.5°F | Ht 66.0 in | Wt 226.0 lb

## 2019-05-28 DIAGNOSIS — R0789 Other chest pain: Secondary | ICD-10-CM | POA: Diagnosis not present

## 2019-05-28 NOTE — Progress Notes (Signed)
Telephone visit  Subjective: CC: back pain PCP: [name], DO KDT:[id number] Nancy Thornton is a 58 y.o. female calls for telephone consult today. Patient provides verbal consent for consult held via phone.  Location of patient: home Location of provider: Working remotely from home Others present for call: none  1. Back pain Patient reports that burning, stabbing pain starts on the left side of her chest and radiates to left shoulder blade and arm and up her neck.  Pain is not changed by activity.  However it does seem modified by certain positions.  She denies pleuritic chest pain, shortness of breath.  She had similar several months ago.  She has been taking Zanaflex up to twice daily along with Tylenol.  This usually helps but did not yesterday.  She also tried gabapentin without improvement.   ROS: Per HPI  Allergies  Allergen Reactions  . Penicillins Hives    Did it involve swelling of the face/tongue/throat, SOB, or low BP? No Did it involve sudden or severe rash/hives, skin peeling, or any reaction on the inside of your mouth or nose? Yes Did you need to seek medical attention at a hospital or doctor's office? No When did it last happen?Occurred at age 25 If all above answers are "NO", may proceed with cephalosporin use.   . Meloxicam Hypertension  . Bactrim [Sulfamethoxazole-Trimethoprim] Rash   Past Medical History:  Diagnosis Date  . Anal fissure    history of  . Asthma   . Depression   . Diabetes mellitus without complication ([place])   . DM (diabetes mellitus) ([place]) 10/19/2012  . Hypertension   . Lichen    hands and feet  . Obesity   . 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  acetaminophen (TYLENOL) 500 MG tablet, Take 1,000 mg by mouth every 6 (six) hours as needed (pain.)., Disp: , Rfl:  .  albuterol (PROAIR HFA) 108 (90 Base) MCG/ACT inhaler, Inhale 2 puffs into the lungs every 6 (six) hours as  needed for wheezing or shortness of breath., Disp: 3.7 g, Rfl: 6 .  amLODipine (NORVASC) 2.5 MG tablet, Take 1 tablet by mouth once daily, Disp: 90 tablet, Rfl: 3 .  atenolol (TENORMIN) 50 MG tablet, Take 1 tablet (50 mg total) by mouth daily., Disp: 30 tablet, Rfl: 12 .  atorvastatin (LIPITOR) 10 MG tablet, Take 1 tablet by mouth once daily, Disp: 90 tablet, Rfl: 3 .  budesonide-formoterol (SYMBICORT) 160-4.5 MCG/ACT inhaler, Inhale 2 puffs into the lungs 2 (two) times daily., Disp: 1 Inhaler, Rfl: 3 .  gabapentin (NEURONTIN) 300 MG capsule, Take 1 capsule (300 mg total) by mouth 2 (two) times daily. (Patient taking differently: Take 300 mg by mouth 3 (three) times daily as needed (pain.). ), Disp: 60 capsule, Rfl: 12 .  lisinopril-hydrochlorothiazide (PRINZIDE,ZESTORETIC) 20-25 MG tablet, Take 1 tablet by mouth daily., Disp: 30 tablet, Rfl: 12 .  Melatonin 5 MG TABS, Take 5 mg by mouth at bedtime., Disp: , Rfl:  .  metFORMIN (GLUCOPHAGE) 1000 MG tablet, Take 1 tablet (1,000 mg total) by mouth 2 (two) times daily with a meal., Disp: 60 tablet, Rfl: 5 .  tiZANidine (ZANAFLEX) 2 MG tablet, Take by mouth., Disp: , Rfl:   Blood pressure (!) 143/85, pulse 75, temperature (!) 97.5 F (36.4 C), height 5\' 6"  (1.676 m), weight 226 lb (102.5 kg).  Dg Chest 2 View  Result Date: 05/28/2019 CLINICAL DATA:  58 year old female with left-sided chest pain. EXAM: CHEST - 2 VIEW COMPARISON:  None. FINDINGS: The lungs are clear. There is no pleural effusion or pneumothorax. The cardiac silhouette is within normal limits. No acute osseous pathology. IMPRESSION: No active cardiopulmonary disease. Electronically Signed   By: Elgie Collard M.D.   On: 05/28/2019 14:28    Assessment/ Plan: 58 y.o. female   1. Atypical chest pain I personally reviewed her EKG.  She has a RBB that is stable from previous.  She has some widening noted in lead 2 but EKG is otherwise unchanged from previous in August.  I will CC her  chart to her primary cardiologist, Dr. Tenny Craw for review of her EKG. I reviewed her chest x-ray which demonstrated no acute infiltrates or cardiac enlargement.  She does have some spurring noted in the thoracic spine which may be contributing to her symptoms.  She is had quite an extensive orthopedic history.  We discussed red flag signs and symptoms warranting further evaluation in the emergency department.  We also discussed possibility of NSTEMI which would not show up on EKG.  Patient voiced good understanding.  At this time I agree this is more likely to be musculoskeletal in nature but given risk factors she should have low threshold to be evaluated in ED if symptoms do not resolve with oral steroid and muscle relaxer.  She voiced good understanding of the plan will follow-up as needed. - EKG 12-Lead - DG Chest 2 View; Future   Start time: 1:42pm; 2:31 pm reviewed results End time: 1:59pm> sent to office for EKG/ CXR; 2:39pm  Total time spent on patient care (including telephone call/ virtual visit): 25 minutes  Nancy Sissel Hulen Skains, DO [facility] [phone]

## 2019-06-06 ENCOUNTER — Telehealth: Payer: Self-pay | Admitting: Family Medicine

## 2019-06-11 ENCOUNTER — Other Ambulatory Visit: Payer: Self-pay | Admitting: Family Medicine

## 2019-06-11 DIAGNOSIS — E1165 Type 2 diabetes mellitus with hyperglycemia: Secondary | ICD-10-CM

## 2019-06-11 NOTE — Telephone Encounter (Signed)
Done

## 2019-07-01 ENCOUNTER — Encounter: Payer: Self-pay | Admitting: Family Medicine

## 2019-07-01 ENCOUNTER — Ambulatory Visit (INDEPENDENT_AMBULATORY_CARE_PROVIDER_SITE_OTHER): Payer: PRIVATE HEALTH INSURANCE | Admitting: Family Medicine

## 2019-07-01 ENCOUNTER — Other Ambulatory Visit: Payer: Self-pay

## 2019-07-01 VITALS — BP 111/65 | HR 62 | Temp 98.0°F | Ht 66.0 in | Wt 226.0 lb

## 2019-07-01 DIAGNOSIS — I152 Hypertension secondary to endocrine disorders: Secondary | ICD-10-CM

## 2019-07-01 DIAGNOSIS — E1159 Type 2 diabetes mellitus with other circulatory complications: Secondary | ICD-10-CM | POA: Diagnosis not present

## 2019-07-01 DIAGNOSIS — E1169 Type 2 diabetes mellitus with other specified complication: Secondary | ICD-10-CM | POA: Diagnosis not present

## 2019-07-01 DIAGNOSIS — L570 Actinic keratosis: Secondary | ICD-10-CM | POA: Diagnosis not present

## 2019-07-01 DIAGNOSIS — J410 Simple chronic bronchitis: Secondary | ICD-10-CM

## 2019-07-01 DIAGNOSIS — E785 Hyperlipidemia, unspecified: Secondary | ICD-10-CM

## 2019-07-01 DIAGNOSIS — E1165 Type 2 diabetes mellitus with hyperglycemia: Secondary | ICD-10-CM | POA: Diagnosis not present

## 2019-07-01 DIAGNOSIS — I10 Essential (primary) hypertension: Secondary | ICD-10-CM

## 2019-07-01 LAB — BAYER DCA HB A1C WAIVED: HB A1C (BAYER DCA - WAIVED): 6.9 % (ref <7.0)

## 2019-07-01 NOTE — Patient Instructions (Signed)
Actinic Keratosis An actinic keratosis is a precancerous growth on the skin. If there is more than one growth, the condition is called actinic keratoses. Actinic keratoses appear most often on areas of skin that get a lot of sun exposure, including the scalp, face, ears, lips, upper back, forearms, and the backs of the hands. If left untreated, these growths may develop into a skin cancer called squamous cell carcinoma. It is important to have all these growths checked by a health care provider to determine the best treatment approach. What are the causes? Actinic keratoses are caused by getting too much ultraviolet (UV) radiation from the sun or other UV light sources. What increases the risk? You are more likely to develop this condition if you:  Have light-colored skin and blue eyes.  Have blond or red hair.  Spend a lot of time in the sun.  Do not protect your skin from the sun when outdoors.  Are an older person. The risk of developing an actinic keratosis increases with age. What are the signs or symptoms? Actinic keratoses feel like scaly, rough spots of skin. Symptoms of this condition include growths that may:  Be as small as a pinhead or as big as a quarter.  Itch, hurt, or feel sensitive.  Be skin-colored, light tan, dark tan, pink, or a combination of any of these colors. In most cases, the growths become red.  Have a small piece of pink or gray skin (skin tag) growing from them. It may be easier to notice actinic keratoses by feeling them, rather than seeing them. Sometimes, actinic keratoses disappear, but many reappear a few days to a few weeks later. How is this diagnosed? This condition is usually diagnosed with a physical exam.  A tissue sample may be removed from the actinic keratosis and examined under a microscope (biopsy). How is this treated? If needed, this condition may be treated by:  Scraping off the actinic keratosis (curettage).  Freezing the actinic  keratosis with liquid nitrogen (cryosurgery). This causes the growth to eventually fall off the skin.  Applying medicated creams or gels to destroy the cells in the growth.  Applying chemicals to the actinic keratosis to make the outer layers of skin peel off (chemical peel).  Using photodynamic therapy. In this procedure, medicated cream is applied to the actinic keratosis. This cream increases your skin's sensitivity to light. Then, a strong light is aimed at the actinic keratosis to destroy cells in the growth. Follow these instructions at home: Skin care  Apply cool, wet cloths (cool compresses) to the affected areas.  Do not scratch your skin.  Check your skin regularly for any growths, especially growths that: ? Start to itch or bleed. ? Change in size, shape, or color. Caring for the treated area  Keep the treated area clean and dry as told by your health care provider.  Do not apply any medicine, cream, or lotion to the treated area unless your health care provider tells you to do that.  Do not pick at blisters or try to break them open. This can cause infection and scarring.  If you have red or irritated skin after treatment, follow instructions from your health care provider about how to take care of the treated area. Make sure you: ? Wash your hands with soap and water before you change your bandage (dressing). If soap and water are not available, use hand sanitizer. ? Change your dressing as told by your health care provider.  If   you have red or irritated skin after treatment, check your treated area every day for signs of infection. Check for: ? Redness, swelling, or pain. ? Fluid or blood. ? Warmth. ? Pus or a bad smell. General instructions  Take or apply over-the-counter and prescription medicines only as told by your health care provider.  Return to your normal activities as told by your health care provider. Ask your health care provider what activities are  safe for you.  Have a skin exam done every year by a health care provider who is a skin specialist (dermatologist).  Keep all follow-up visits as told by your health care provider. This is important. Lifestyle  Do not use any products that contain nicotine or tobacco, such as cigarettes and e-cigarettes. If you need help quitting, ask your health care provider.  Take steps to protect your skin from the sun. ? Try to avoid the sun between 10:00 a.m. and 4:00 p.m. This is when the UV light is the strongest. ? Use a sunscreen or sunblock with SPF 30 (sun protection factor 30) or greater. ? Apply sunscreen before you are exposed to sunlight and reapply as often as directed by the instructions on the sunscreen container. ? Always wear sunglasses that have UV protection, and always wear a hat and clothing to protect your skin from sunlight. ? When possible, avoid medicines that increase your sensitivity to sunlight. ? Do not use tanning beds or other indoor tanning devices. Contact a health care provider if:  You notice any changes or new growths on your skin.  You have swelling, pain, or more redness around your treated area.  You have fluid or blood coming from your treated area.  Your treated area feels warm to the touch.  You have pus or a bad smell coming from your treated area.  You have a fever.  You have a blister that becomes large and painful. Summary  An actinic keratosis is a precancerous growth on the skin. If there is more than one growth, the condition is called actinic keratoses. In some cases, if left untreated, these growths can develop into skin cancer.  Check your skin regularly for any growths, especially growths that start to itch or bleed, or change in size, shape, or color.  Take steps to protect your skin from the sun.  Contact a health care provider if you notice any changes or new growths on your skin.  Keep all follow-up visits as told by your health  care provider. This is important. This information is not intended to replace advice given to you by your health care provider. Make sure you discuss any questions you have with your health care provider. Document Released: 09/16/2008 Document Revised: 10/31/2017 Document Reviewed: 10/31/2017 Elsevier Patient Education  2020 Elsevier Inc.  

## 2019-07-01 NOTE — Progress Notes (Signed)
Subjective: CC: f/u DM, COPD PCP: Raliegh Ip, DO NGE:[id number] Garling is a 58 y.o. female presenting to clinic today for:  1. Type 2 Diabetes w/ HTN, HLD  Patient reports compliance with metformin 500 mg twice daily, Norvasc 2.5 mg daily, Tenormin 50 mg, Zestoretic 20-25 mg and atorvastatin 10 mg daily.  She notes that she is losing her insurance soon but most of her medications will be inexpensive.  No chest pain, shortness of breath, lower extremity edema or dizziness.  Last eye exam: Needs Last foot exam: UTD Last A1c:  Lab Results  Component Value Date   HGBA1C 6.8 (H) 03/07/2019   Nephropathy screen indicated?: on ACE-I Last flu, zoster and/or pneumovax:  Immunization History  Administered Date(s) Administered  . Influenza, High Dose Seasonal PF 03/30/2018  . Influenza, Quadrivalent, Recombinant, Inj, Pf 03/20/2019  . Influenza, Seasonal, Injecte, Preservative Fre 04/18/2013, 04/12/2014, 03/12/2015, 04/08/2017  . Influenza,inj,Quad PF,6+ Mos 04/18/2013, 03/12/2015, 04/08/2017, 03/30/2018  . Influenza-Unspecified 04/12/2014, 03/20/2019  . Pneumococcal Conjugate-13 09/05/2013  . Pneumococcal Polysaccharide-23 03/12/2015  . Td 01/09/2017  . Tdap 01/09/2017   2.  COPD/tobacco use disorder Patient continues to smoke.  She notes nightly use of Symbicort but does not use this during the daytime.  Does not endorse any unplanned weight loss, night sweats.  3.  Jaw lesion Patient reports a spot on her left side of the jaw which seems to crust.  She often will pick at it but it always recurs.  Denies any spontaneous bleeding.  She tries to stay out of the sun but has had a few sunburns in her lifetime.   ROS: Per HPI  Allergies  Allergen Reactions  . Penicillins Hives    Did it involve swelling of the face/tongue/throat, SOB, or low BP? No Did it involve sudden or severe rash/hives, skin peeling, or any reaction on the inside of your mouth or nose? Yes Did you need to  seek medical attention at a hospital or doctor's office? No When did it last happen?Occurred at age 39 If all above answers are "NO", may proceed with cephalosporin use.   . Meloxicam Hypertension  . Bactrim [Sulfamethoxazole-Trimethoprim] Rash   Past Medical History:  Diagnosis Date  . Anal fissure    history of  . Asthma   . Depression   . Diabetes mellitus without complication (HCC)   . DM (diabetes mellitus) (HCC) 10/19/2012  . Hypertension   . Lichen    hands and feet  . Obesity   . 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  acetaminophen (TYLENOL) 500 MG tablet, Take 1,000 mg by mouth every 6 (six) hours as needed (pain.)., Disp: , Rfl:  .  albuterol (PROAIR HFA) 108 (90 Base) MCG/ACT inhaler, Inhale 2 puffs into the lungs every 6 (six) hours as needed for wheezing or shortness of breath., Disp: 3.7 g, Rfl: 6 .  amLODipine (NORVASC) 2.5 MG tablet, Take 1 tablet by mouth once daily, Disp: 90 tablet, Rfl: 3 .  atenolol (TENORMIN) 50 MG tablet, Take 1 tablet (50 mg total) by mouth daily., Disp: 30 tablet, Rfl: 12 .  atorvastatin (LIPITOR) 10 MG tablet, Take 1 tablet by mouth once daily, Disp: 90 tablet, Rfl: 3 .  budesonide-formoterol (SYMBICORT) 160-4.5 MCG/ACT inhaler, Inhale 2 puffs into the lungs 2 (two) times daily., Disp: 1 Inhaler, Rfl: 3 .  gabapentin (NEURONTIN) 300 MG capsule, Take 1 capsule (300 mg  total) by mouth 2 (two) times daily. (Patient taking differently: Take 300 mg by mouth 3 (three) times daily as needed (pain.). ), Disp: 60 capsule, Rfl: 12 .  lisinopril-hydrochlorothiazide (PRINZIDE,ZESTORETIC) 20-25 MG tablet, Take 1 tablet by mouth daily., Disp: 30 tablet, Rfl: 12 .  Melatonin 5 MG TABS, Take 5 mg by mouth at bedtime., Disp: , Rfl:  .  metFORMIN (GLUCOPHAGE) 1000 MG tablet, Take 1 tablet (1,000 mg total) by mouth 2 (two) times daily with a meal., Disp: 60 tablet, Rfl: 5 .  tiZANidine  (ZANAFLEX) 2 MG tablet, Take by mouth., Disp: , Rfl:  Social History   Socioeconomic History  . Marital status: Divorced    Spouse name: Not on file  . Number of children: 1  . Years of education: Not on file  . Highest education level: Not on file  Occupational History  . 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 Smoking status: Light Tobacco Smoker    Packs/day: 0.50    Types: Cigarettes    Start date: 10/18/1977    Last attempt to quit: 02/10/2019    Years since quitting: 0.3  . Smokeless tobacco: Never Used  Substance and Sexual Activity  . Alcohol use: No  . Drug use: No  . Sexual activity: Not Currently  Other Topics Concern  . Not on file  Social History Narrative  . Not on file   Social Determinants of Health   Financial Resource Strain:   . Difficulty of Paying Living Expenses: Not on file  Food Insecurity:   . Worried About Programme researcher, broadcasting/film/videounning Out of Food in the Last Year: Not on file  . Ran Out of Food in the Last Year: Not on file  Transportation Needs:   . Lack of Transportation (Medical): Not on file  . Lack of Transportation (Non-Medical): Not on file  Physical Activity:   . Days of Exercise per Week: Not on file  . Minutes of Exercise per Session: Not on file  Stress:   . Feeling of Stress : Not on file  Social Connections:   . Frequency of Communication with Friends and Family: Not on file  . Frequency of Social Gatherings with Friends and Family: Not on file  . Attends Religious Services: Not on file  . Active Member of Clubs or Organizations: Not on file  . Attends BankerClub or Organization Meetings: Not on file  . Marital Status: Not on file  Intimate Partner Violence:   . Fear of Current or Ex-Partner: Not on file  . Emotionally Abused: Not on file  . Physically Abused: Not on file  . Sexually Abused: Not on file   Family History  Problem Relation Age of Onset  . Diabetes Mother   . Hypertension Father   . Colon cancer Neg Hx   .  Esophageal cancer Neg Hx   . Rectal cancer Neg Hx   . Stomach cancer Neg Hx     Objective: Office vital signs reviewed. BP 111/65   Pulse 62   Temp 98 F (36.7 C) (Temporal)   Ht 5\' 6"  (1.676 m)   Wt 226 lb (102.5 kg)   SpO2 94%   BMI 36.48 kg/m   Physical Examination:  General: Awake, alert, well nourished, No acute distress HEENT: Normal, sclera white, MMM Cardio: regular rate and rhythm, S1S2 heard, no murmurs appreciated Pulm: Global expiratory wheezes noted.  Air movement fair.  She has prolonged expiratory phase.  Normal work of breathing  on room air Extremities: warm, well perfused, No edema, cyanosis or clubbing; +2 pulses bilaterally Skin: Patient with a 1.5 mm flesh-colored hyperkeratotic area noted along the left lower jawline.  No umbilication or pigmentation.  Cryotherapy Procedure:  Risks and benefits of procedure were reviewed with the patient.  Written consent obtained and scanned into the chart.  Lesion of concern was identified and located on left jaw.  Liquid nitrogen was applied to area of concern and extending out 1 millimeters beyond the border of the lesion.  Treated area was allowed to come back to room temperature before treating it a second time.  Patient tolerated procedure well and there were no immediate complications.  Home care instructions were reviewed with the patient and a handout was provided.   Assessment/ Plan: 58 y.o. female   1. Type 2 diabetes mellitus with hyperglycemia, without long-term current use of insulin (HCC) Well-controlled.  Continue current regimen - hgba1c  2. Hypertension associated with diabetes ([place]) Controlled.  3. Hyperlipidemia associated with type 2 diabetes mellitus ([place]) Stable.  Continue statin  4. Actinic keratosis of left cheek Treated with cryotherapy.  Home care instructions reviewed.  Follow-up as needed  5. Simple chronic bronchitis ([place]) I have encouraged her to apply for patient assistance for the  Symbicort.  I will be glad to complete any forms needed so that patient may afford her medications.    No orders of the defined types were placed in this encounter.  No orders of the defined types were placed in this encounter.    [name], DO [place] [phone]

## 2019-07-03 ENCOUNTER — Other Ambulatory Visit: Payer: Self-pay | Admitting: Family Medicine

## 2019-07-03 ENCOUNTER — Telehealth: Payer: Self-pay | Admitting: Family Medicine

## 2019-07-03 DIAGNOSIS — J4530 Mild persistent asthma, uncomplicated: Secondary | ICD-10-CM

## 2019-07-03 MED ORDER — ALBUTEROL SULFATE HFA 108 (90 BASE) MCG/ACT IN AERS: 2.0000 | INHALATION_SPRAY | Freq: Four times a day (QID) | RESPIRATORY_TRACT | 6 refills | Status: DC | PRN

## 2019-07-03 MED ORDER — BUDESONIDE-FORMOTEROL FUMARATE 160-4.5 MCG/ACT IN AERO: 2.0000 | INHALATION_SPRAY | Freq: Two times a day (BID) | RESPIRATORY_TRACT | 3 refills | Status: DC

## 2019-07-03 NOTE — Telephone Encounter (Signed)
Is patient is asking for a refill of Symbicort and albuterol inhaler can we send both to [facility] before her insurance runs out? Patient did not know if she was suppose to be on both or note. Insurance runs out 01/01/221

## 2019-07-03 NOTE — Telephone Encounter (Signed)
Sent.  Yes. Symbicort for daily use and albuterol for PRN use

## 2019-07-13 ENCOUNTER — Other Ambulatory Visit: Payer: Self-pay | Admitting: Family Medicine

## 2019-07-13 DIAGNOSIS — I152 Hypertension secondary to endocrine disorders: Secondary | ICD-10-CM

## 2019-07-13 DIAGNOSIS — E1159 Type 2 diabetes mellitus with other circulatory complications: Secondary | ICD-10-CM

## 2019-07-16 ENCOUNTER — Ambulatory Visit: Payer: PRIVATE HEALTH INSURANCE | Admitting: Family Medicine

## 2019-07-20 ENCOUNTER — Other Ambulatory Visit: Payer: Self-pay | Admitting: Family Medicine

## 2019-07-20 DIAGNOSIS — E1165 Type 2 diabetes mellitus with hyperglycemia: Secondary | ICD-10-CM

## 2019-07-23 ENCOUNTER — Other Ambulatory Visit: Payer: Self-pay | Admitting: Family Medicine

## 2019-07-23 DIAGNOSIS — E1159 Type 2 diabetes mellitus with other circulatory complications: Secondary | ICD-10-CM

## 2019-07-23 DIAGNOSIS — I152 Hypertension secondary to endocrine disorders: Secondary | ICD-10-CM

## 2019-07-23 DIAGNOSIS — E1165 Type 2 diabetes mellitus with hyperglycemia: Secondary | ICD-10-CM

## 2019-08-12 ENCOUNTER — Ambulatory Visit: Payer: PRIVATE HEALTH INSURANCE | Admitting: Family Medicine

## 2019-08-26 ENCOUNTER — Other Ambulatory Visit: Payer: Self-pay | Admitting: Family Medicine

## 2019-08-26 DIAGNOSIS — E1159 Type 2 diabetes mellitus with other circulatory complications: Secondary | ICD-10-CM

## 2019-08-26 DIAGNOSIS — I152 Hypertension secondary to endocrine disorders: Secondary | ICD-10-CM

## 2019-09-16 ENCOUNTER — Other Ambulatory Visit: Payer: Self-pay | Admitting: Family Medicine

## 2019-09-20 ENCOUNTER — Ambulatory Visit: Payer: Self-pay | Attending: Internal Medicine

## 2019-09-20 DIAGNOSIS — Z23 Encounter for immunization: Secondary | ICD-10-CM

## 2019-09-20 NOTE — Progress Notes (Signed)
   Covid-19 Vaccination Clinic  Name:  Nancy Thornton    MRN: [id number] DOB: [birth year]  09/20/2019  [name] was observed post Covid-19 immunization for 15 minutes without incident. She was provided with Vaccine Information Sheet and instruction to access the V-Safe system.   [name] was instructed to call 911 with any severe reactions post vaccine: Marland Kitchen Difficulty breathing  . Swelling of face and throat  . A fast heartbeat  . A bad rash all over body  . Dizziness and weakness   Immunizations Administered    Name Date Dose VIS Date Route   Moderna COVID-19 Vaccine 09/20/2019  9:42 AM 0.5 mL 06/04/2019 Intramuscular   Manufacturer: Moderna   Lot: [id number]   NDC: 86761-950-93

## 2019-10-03 ENCOUNTER — Ambulatory Visit (INDEPENDENT_AMBULATORY_CARE_PROVIDER_SITE_OTHER): Payer: 59 | Admitting: Family Medicine

## 2019-10-03 ENCOUNTER — Other Ambulatory Visit: Payer: Self-pay

## 2019-10-03 VITALS — BP 115/74 | HR 66 | Temp 99.3°F | Ht 66.0 in | Wt 221.0 lb

## 2019-10-03 DIAGNOSIS — E1165 Type 2 diabetes mellitus with hyperglycemia: Secondary | ICD-10-CM | POA: Diagnosis not present

## 2019-10-03 DIAGNOSIS — Z130 Encounter for screening for diseases of the blood and blood-forming organs and certain disorders involving the immune mechanism: Secondary | ICD-10-CM

## 2019-10-03 DIAGNOSIS — E785 Hyperlipidemia, unspecified: Secondary | ICD-10-CM

## 2019-10-03 DIAGNOSIS — M549 Dorsalgia, unspecified: Secondary | ICD-10-CM

## 2019-10-03 DIAGNOSIS — E1159 Type 2 diabetes mellitus with other circulatory complications: Secondary | ICD-10-CM | POA: Diagnosis not present

## 2019-10-03 DIAGNOSIS — I10 Essential (primary) hypertension: Secondary | ICD-10-CM

## 2019-10-03 DIAGNOSIS — I152 Hypertension secondary to endocrine disorders: Secondary | ICD-10-CM

## 2019-10-03 DIAGNOSIS — E1169 Type 2 diabetes mellitus with other specified complication: Secondary | ICD-10-CM

## 2019-10-03 LAB — BAYER DCA HB A1C WAIVED: HB A1C (BAYER DCA - WAIVED): 6.5 % (ref <7.0)

## 2019-10-03 MED ORDER — TIZANIDINE HCL 4 MG PO TABS: 2.0000 mg | ORAL_TABLET | Freq: Three times a day (TID) | ORAL | 1 refills | Status: DC | PRN

## 2019-10-03 MED ORDER — METFORMIN HCL 1000 MG PO TABS: ORAL_TABLET | ORAL | 3 refills | Status: DC

## 2019-10-03 MED ORDER — LISINOPRIL-HYDROCHLOROTHIAZIDE 20-25 MG PO TABS: 1.0000 | ORAL_TABLET | Freq: Every day | ORAL | 3 refills | Status: DC

## 2019-10-03 NOTE — Progress Notes (Signed)
Subjective: CC: f/u DM, COPD PCP: [name], DO EMV:[id number] Tanney is a 59 y.o. female presenting to clinic today for:  1. Type 2 Diabetes w/ HTN, HLD  Patient reports compliance with metformin 1000 mg twice daily, Norvasc 2.5 mg daily, Tenormin 50 mg, Zestoretic 20-25 mg and atorvastatin 10 mg daily.   No chest pain, shortness of breath, lower extremity edema or dizziness.   Last eye exam: Needs Last foot exam: UTD Last A1c:  Lab Results  Component Value Date   HGBA1C 6.9 07/01/2019   Nephropathy screen indicated?: on ACE-I Last flu, zoster and/or pneumovax:  Immunization History  Administered Date(s) Administered  . Influenza, High Dose Seasonal PF 03/30/2018  . Influenza, Quadrivalent, Recombinant, Inj, Pf 03/20/2019  . Influenza, Seasonal, Injecte, Preservative Fre 04/18/2013, 04/12/2014, 03/12/2015, 04/08/2017  . Influenza,inj,Quad PF,6+ Mos 04/18/2013, 03/12/2015, 04/08/2017, 03/30/2018  . Influenza-Unspecified 04/12/2014, 03/20/2019  . Moderna SARS-COVID-2 Vaccination 09/20/2019  . Pneumococcal Conjugate-13 09/05/2013  . Pneumococcal Polysaccharide-23 03/12/2015  . Td 01/09/2017  . Tdap 01/09/2017    2.  Chronic back pain Patient with ongoing chronic left sided low back pain that radiates into her left buttock and down the posterior leg.  No falls but symptoms are exacerbated by standing for more than 1 hour.  She is really motivated to try and get back to work and is looking for a position that will allow for some sitting.  She is applying to [employer].  She has been using her tizanidine at night.  Typically taking tylenol and gabapentin during the day with some relief but no resolution.  Pain does interfere with daily activities and ambulation.  She has a follow up visit with her back surgeon in April.  ROS: Per HPI  Allergies  Allergen Reactions  . Penicillins Hives    Did it involve swelling of the face/tongue/throat, SOB, or low BP? No Did it  involve sudden or severe rash/hives, skin peeling, or any reaction on the inside of your mouth or nose? Yes Did you need to seek medical attention at a hospital or doctor's office? No When did it last happen?Occurred at age 70 If all above answers are "NO", may proceed with cephalosporin use.   . Meloxicam Hypertension  . Bactrim [Sulfamethoxazole-Trimethoprim] Rash   Past Medical History:  Diagnosis Date  . Anal fissure    history of  . Asthma   . Depression   . Diabetes mellitus without complication ([place])   . DM (diabetes mellitus) ([place]) 10/19/2012  . Hypertension   . Lichen    hands and feet  . Obesity   . 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  acetaminophen (TYLENOL) 500 MG tablet, Take 1,000 mg by mouth every 6 (six) hours as needed (pain.)., Disp: , Rfl:  .  albuterol (PROAIR HFA) 108 (90 Base) MCG/ACT inhaler, Inhale 2 puffs into the lungs every 6 (six) hours as needed for wheezing or shortness of breath., Disp: 3.7 g, Rfl: 6 .  amLODipine (NORVASC) 2.5 MG tablet, Take 1 tablet by mouth once daily, Disp: 90 tablet, Rfl: 3 .  atenolol (TENORMIN) 50 MG tablet, Take 1 tablet by mouth once daily, Disp: 90 tablet, Rfl: 1 .  atorvastatin (LIPITOR) 10 MG tablet, Take 1 tablet by mouth once daily, Disp: 90 tablet, Rfl: 3 .  budesonide-formoterol (SYMBICORT) 160-4.5 MCG/ACT inhaler, Inhale 2 puffs into the lungs 2 (two) times daily., Disp: 1  Inhaler, Rfl: 3 .  gabapentin (NEURONTIN) 300 MG capsule, Take 1 capsule (300 mg total) by mouth 3 (three) times daily., Disp: 90 capsule, Rfl: 3 .  lisinopril-hydrochlorothiazide (ZESTORETIC) 20-25 MG tablet, Take 1 tablet by mouth once daily, Disp: 90 tablet, Rfl: 0 .  Melatonin 5 MG TABS, Take 5 mg by mouth at bedtime., Disp: , Rfl:  .  metFORMIN (GLUCOPHAGE) 1000 MG tablet, TAKE 1 TABLET BY MOUTH TWICE DAILY WITH A MEAL, Disp: 180 tablet, Rfl: 0 .  Omega-3 Fatty Acids (FISH  OIL) 1000 MG CAPS, Take by mouth., Disp: , Rfl:  .  tiZANidine (ZANAFLEX) 2 MG tablet, Take by mouth., Disp: , Rfl:  Social History   Socioeconomic History  . Marital status: Divorced    Spouse name: Not on file  . Number of children: 1  . Years of education: Not on file  . Highest education level: Not on file  Occupational History  . 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 Smoking status: Light Tobacco Smoker    Packs/day: 0.50    Types: Cigarettes    Start date: 10/18/1977    Last attempt to quit: 02/10/2019    Years since quitting: 0.6  . Smokeless tobacco: Never Used  Substance and Sexual Activity  . Alcohol use: No  . Drug use: No  . Sexual activity: Not Currently  Other Topics Concern  . Not on file  Social History Narrative  . Not on file   Social Determinants of Health   Financial Resource Strain:   . Difficulty of Paying Living Expenses:   Food Insecurity:   . Worried About Charity fundraiser in the Last Year:   . Arboriculturist in the Last Year:   Transportation Needs:   . Film/video editor (Medical):   Marland Kitchen Lack of Transportation (Non-Medical):   Physical Activity:   . Days of Exercise per Week:   . Minutes of Exercise per Session:   Stress:   . Feeling of Stress :   Social Connections:   . Frequency of Communication with Friends and Family:   . Frequency of Social Gatherings with Friends and Family:   . Attends Religious Services:   . Active Member of Clubs or Organizations:   . Attends Archivist Meetings:   Marland Kitchen Marital Status:   Intimate Partner Violence:   . Fear of Current or Ex-Partner:   . Emotionally Abused:   Marland Kitchen Physically Abused:   . Sexually Abused:    Family History  Problem Relation Age of Onset  . Diabetes Mother   . Hypertension Father   . Colon cancer Neg Hx   . Esophageal cancer Neg Hx   . Rectal cancer Neg Hx   . Stomach cancer Neg Hx     Objective: Office vital signs reviewed. BP  115/74   Pulse 66   Temp 99.3 F (37.4 C) (Temporal)   Ht _0  (1.676 m)   Wt 221 lb (100.2 kg)   SpO2 98%   BMI 35.67 kg/m   Physical Examination:  General: Awake, alert, well nourished, No acute distress HEENT: Normal, sclera white, MMM Cardio: regular rate and rhythm, S1S2 heard, no murmurs appreciated Pulm: Mild Global expiratory wheezes noted.  Air movement fair.  She has prolonged expiratory phase.  Normal work of breathing on room air Extremities: warm, well perfused, No edema, cyanosis or clubbing; +2 pulses bilaterally MSK: well healed lumbar scars.  Ambulates independently  though gait is antalgic and stiff.  Assessment/ Plan: 59 y.o. female   1. Type 2 diabetes mellitus with hyperglycemia, without long-term current use of insulin (HCC) Controlled. - Bayer DCA Hb A1c Waived - metFORMIN (GLUCOPHAGE) 1000 MG tablet; TAKE 1 TABLET BY MOUTH TWICE DAILY WITH A MEAL  Dispense: 180 tablet; Refill: 3  2. Hypertension associated with diabetes ([place]) Controlled - lisinopril-hydrochlorothiazide (ZESTORETIC) 20-25 MG tablet; Take 1 tablet by mouth daily.  Dispense: 90 tablet; Refill: 3  3. Hyperlipidemia associated with type 2 diabetes mellitus (HCC) Check nonfasting lipid - Lipid Panel - CMP14+EGFR - TSH  4. Screening, anemia, deficiency, iron - CBC  5. Back pain with history of spinal surgery Increased muscle relaxer dose to 7m if needed.  Caution sedation.  - tiZANidine (ZANAFLEX) 4 MG tablet; Take 0.5-1 tablets (2-4 mg total) by mouth every 8 (eight) hours as needed for muscle spasms.  Dispense: 90 tablet; Refill: 1    Orders Placed This Encounter  Procedures  . Bayer DCA Hb A1c Waived  . Lipid Panel  . CMP14+EGFR  . CBC  . TSH   No orders of the defined types were placed in this encounter.    AJanora Norlander DO [facility] [place]([phone]

## 2019-10-03 NOTE — Patient Instructions (Signed)
Sugar looks great!  You had labs performed today.  You will be contacted with the results of the labs once they are available, usually in the next 3 business days for routine lab work.  If you have an active my chart account, they will be released to your MyChart.  If you prefer to have these labs released to you via telephone, please let us know.  If you had a pap smear or biopsy performed, expect to be contacted in about 7-10 days.  I have increased the dose of the muscle relaxer.  You can break in half if too strong.

## 2019-10-04 LAB — CMP14+EGFR
ALT: 21 IU/L (ref 0–32)
AST: 21 IU/L (ref 0–40)
Albumin/Globulin Ratio: 2.7 — ABNORMAL HIGH (ref 1.2–2.2)
Albumin: 4.8 g/dL (ref 3.8–4.9)
Alkaline Phosphatase: 67 IU/L (ref 39–117)
BUN/Creatinine Ratio: 20 (ref 9–23)
BUN: 18 mg/dL (ref 6–24)
Bilirubin Total: 0.7 mg/dL (ref 0.0–1.2)
CO2: 28 mmol/L (ref 20–29)
Calcium: 10.2 mg/dL (ref 8.7–10.2)
Chloride: 98 mmol/L (ref 96–106)
Creatinine, Ser: 0.91 mg/dL (ref 0.57–1.00)
GFR calc Af Amer: 80 mL/min/{1.73_m2} (ref >59)
GFR calc non Af Amer: 70 mL/min/{1.73_m2} (ref >59)
Globulin, Total: 1.8 g/dL (ref 1.5–4.5)
Glucose: 163 mg/dL — ABNORMAL HIGH (ref 65–99)
Potassium: 4.8 mmol/L (ref 3.5–5.2)
Sodium: 142 mmol/L (ref 134–144)
Total Protein: 6.6 g/dL (ref 6.0–8.5)

## 2019-10-04 LAB — CBC
Hematocrit: 44 % (ref 34.0–46.6)
Hemoglobin: 15.7 g/dL (ref 11.1–15.9)
MCH: 33.4 pg — ABNORMAL HIGH (ref 26.6–33.0)
MCHC: 35.7 g/dL (ref 31.5–35.7)
MCV: 94 fL (ref 79–97)
Platelets: 274 10*3/uL (ref 150–450)
RBC: 4.7 x10E6/uL (ref 3.77–5.28)
RDW: 12 % (ref 11.7–15.4)
WBC: 7.5 10*3/uL (ref 3.4–10.8)

## 2019-10-04 LAB — LIPID PANEL
Chol/HDL Ratio: 2.7 ratio (ref 0.0–4.4)
Cholesterol, Total: 128 mg/dL (ref 100–199)
HDL: 47 mg/dL (ref >39)
LDL Chol Calc (NIH): 42 mg/dL (ref 0–99)
Triglycerides: 251 mg/dL — ABNORMAL HIGH (ref 0–149)
VLDL Cholesterol Cal: 39 mg/dL (ref 5–40)

## 2019-10-04 LAB — TSH: TSH: 1.34 u[IU]/mL (ref 0.450–4.500)

## 2019-10-20 ENCOUNTER — Emergency Department (HOSPITAL_COMMUNITY): Payer: 59

## 2019-10-20 ENCOUNTER — Encounter (HOSPITAL_COMMUNITY): Payer: Self-pay

## 2019-10-20 ENCOUNTER — Emergency Department (HOSPITAL_COMMUNITY)
Admission: EM | Admit: 2019-10-20 | Discharge: 2019-10-20 | Disposition: A | Discharge status: Home / Self Care | Payer: 59 | Attending: Emergency Medicine | Admitting: Emergency Medicine

## 2019-10-20 ENCOUNTER — Other Ambulatory Visit: Payer: Self-pay

## 2019-10-20 DIAGNOSIS — R079 Chest pain, unspecified: Secondary | ICD-10-CM | POA: Insufficient documentation

## 2019-10-20 DIAGNOSIS — F17210 Nicotine dependence, cigarettes, uncomplicated: Secondary | ICD-10-CM | POA: Diagnosis not present

## 2019-10-20 DIAGNOSIS — Z7984 Long term (current) use of oral hypoglycemic drugs: Secondary | ICD-10-CM | POA: Insufficient documentation

## 2019-10-20 DIAGNOSIS — E119 Type 2 diabetes mellitus without complications: Secondary | ICD-10-CM | POA: Insufficient documentation

## 2019-10-20 DIAGNOSIS — Z79899 Other long term (current) drug therapy: Secondary | ICD-10-CM | POA: Diagnosis not present

## 2019-10-20 DIAGNOSIS — R0789 Other chest pain: Secondary | ICD-10-CM | POA: Diagnosis present

## 2019-10-20 DIAGNOSIS — I10 Essential (primary) hypertension: Secondary | ICD-10-CM | POA: Insufficient documentation

## 2019-10-20 LAB — CBC
HCT: 41.9 % (ref 36.0–46.0)
Hemoglobin: 15.1 g/dL — ABNORMAL HIGH (ref 12.0–15.0)
MCH: 32.4 pg (ref 26.0–34.0)
MCHC: 36 g/dL (ref 30.0–36.0)
MCV: 89.9 fL (ref 80.0–100.0)
Platelets: 235 10*3/uL (ref 150–400)
RBC: 4.66 MIL/uL (ref 3.87–5.11)
RDW: 11.2 % — ABNORMAL LOW (ref 11.5–15.5)
WBC: 7.7 10*3/uL (ref 4.0–10.5)
nRBC: 0 % (ref 0.0–0.2)

## 2019-10-20 LAB — BASIC METABOLIC PANEL
Anion gap: 13 (ref 5–15)
BUN: 17 mg/dL (ref 6–20)
CO2: 28 mmol/L (ref 22–32)
Calcium: 9.4 mg/dL (ref 8.9–10.3)
Chloride: 96 mmol/L — ABNORMAL LOW (ref 98–111)
Creatinine, Ser: 0.7 mg/dL (ref 0.44–1.00)
GFR calc Af Amer: >60 mL/min (ref >60)
GFR calc non Af Amer: >60 mL/min (ref >60)
Glucose, Bld: 202 mg/dL — ABNORMAL HIGH (ref 70–99)
Potassium: 3.8 mmol/L (ref 3.5–5.1)
Sodium: 137 mmol/L (ref 135–145)

## 2019-10-20 LAB — TROPONIN I (HIGH SENSITIVITY)
Troponin I (High Sensitivity): <2 ng/L (ref <18)
Troponin I (High Sensitivity): <2 ng/L (ref <18)

## 2019-10-20 NOTE — ED Provider Notes (Signed)
[facility] Provider Note   CSN: [id number] Arrival date & time: 10/20/19  0940     History Chief Complaint  Patient presents with  . Chest Pain    Nancy Thornton is a 59 y.o. female.  Patient is a 59 year old female with past medical history of diabetes, hypertension, hyperlipidemia, tobacco use.  She presents today for evaluation of chest discomfort.  This began yesterday evening while the patient was at home.  She describes an episode of "squeezing" in the center of her chest followed by a burning sensation that lasted approximately 2 minutes, then resolved.  She has had three additional episodes of this, two last night, then one again this morning and presents for evaluation of it.  Patient denies any recent exertional symptoms.  She denies any shortness of breath, nausea, diaphoresis, or radiation of the discomfort when it was present.  Patient reports no prior cardiac history.  She has never had a heart cath or stress test.  The history is provided by the patient.  Chest Pain Pain location:  Substernal area Pain quality comment:  Squeezing Pain radiates to:  Does not radiate Pain severity:  Moderate Duration:  12 hours Timing:  Intermittent Progression:  Resolved Chronicity:  New Relieved by:  Nothing Worsened by:  Nothing Ineffective treatments:  None tried      Past Medical History:  Diagnosis Date  . Anal fissure    history of  . Asthma   . Depression   . Diabetes mellitus without complication ([place])   . DM (diabetes mellitus) ([place]) 10/19/2012  . Hypertension   . Lichen    hands and feet  . Obesity   . 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 Back pain with history of spinal surgery 04/12/2019  . S/P lumbar fusion 03/06/2019  . Tobacco use 07/10/2018  . Morbid obesity ([place]) 07/10/2018  . 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 Smokes 02/20/2018  . History of  arthroscopy of knee 09/26/2017  . Simple chronic bronchitis ([place]) 10/28/2014  . Hypertension associated with diabetes ([place]) 10/19/2012  . Type 2 diabetes mellitus with hyperglycemia, without long-term current use of insulin ([place]) 10/19/2012  . Hyperlipidemia associated with type 2 diabetes mellitus ([place]) 10/19/2012    Past Surgical History:  Procedure Laterality Date  . ANTERIOR LUMBAR FUSION N/A 03/06/2019   Procedure: L4-5 XLIF, L4-5 PSF, LEFT SYNOVIAL CYST RESECTION L4-5;  Surgeon: Meade Maw, MD;  Location: [facility] ORS;  Service: Neurosurgery;  Laterality: N/A;  . BACK SURGERY    . CHOLECYSTECTOMY    . CYSTOSTOMY W/ BLADDER BIOPSY  1990   punctured hole in bladder, had open procedure to repair  . MENISCUS REPAIR    . TONSILLECTOMY    . TOTAL VAGINAL HYSTERECTOMY     59 y/o. bleeding. ovaries left in situ     OB History    Gravida  2   Para  2   Term      Preterm      AB      Living        SAB      TAB      Ectopic      Multiple      Live Births           Obstetric Comments  SVD x 2        Family History  Problem  Relation Age of Onset  . Diabetes Mother   . Hypertension Father   . Colon cancer Neg Hx   . Esophageal cancer Neg Hx   . Rectal cancer Neg Hx   . Stomach cancer Neg Hx     Social History   Tobacco Use  . Smoking status: Light Tobacco Smoker    Packs/day: 0.50    Types: Cigarettes    Start date: 10/18/1977    Last attempt to quit: 02/10/2019    Years since quitting: 0.6  . Smokeless tobacco: Never Used  Substance Use Topics  . Alcohol use: No  . Drug use: No    Home Medications Prior to Admission medications   Medication Sig Start Date End Date Taking? Authorizing Provider  acetaminophen (TYLENOL) 500 MG tablet Take 1,000 mg by mouth every 6 (six) hours as needed (pain.).    [provider]  albuterol (PROAIR HFA) 108 (90 Base) MCG/ACT inhaler Inhale 2 puffs into the lungs every 6 (six) hours as needed for wheezing  or shortness of breath. 07/03/19   Raliegh Ip, DO  amLODipine (NORVASC) 2.5 MG tablet Take 1 tablet by mouth once daily 12/17/18   Pricilla Riffle, MD  atenolol (TENORMIN) 50 MG tablet Take 1 tablet by mouth once daily 07/15/19   Delynn Flavin M, DO  atorvastatin (LIPITOR) 10 MG tablet Take 1 tablet by mouth once daily 12/17/18   Pricilla Riffle, MD  budesonide-formoterol [facility]) 160-4.5 MCG/ACT inhaler Inhale 2 puffs into the lungs 2 (two) times daily. 07/03/19   Raliegh Ip, DO  gabapentin (NEURONTIN) 300 MG capsule Take 1 capsule (300 mg total) by mouth 3 (three) times daily. 09/16/19   Raliegh Ip, DO  lisinopril-hydrochlorothiazide (ZESTORETIC) 20-25 MG tablet Take 1 tablet by mouth daily. 10/03/19   Raliegh Ip, DO  Melatonin 5 MG TABS Take 5 mg by mouth at bedtime.    [provider]  metFORMIN (GLUCOPHAGE) 1000 MG tablet TAKE 1 TABLET BY MOUTH TWICE DAILY WITH A MEAL 10/03/19   Gottschalk, Kathie Rhodes M, DO  Omega-3 Fatty Acids (FISH OIL) 1000 MG CAPS Take by mouth.    [provider]  tiZANidine (ZANAFLEX) 4 MG tablet Take 0.5-1 tablets (2-4 mg total) by mouth every 8 (eight) hours as needed for muscle spasms. 10/03/19   Raliegh Ip, DO    Allergies    Penicillins, Meloxicam, and Bactrim [sulfamethoxazole-trimethoprim]  Review of Systems   Review of Systems  Cardiovascular: Positive for chest pain.  All other systems reviewed and are negative.   Physical Exam Updated Vital Signs BP (!) 145/89 (BP Location: Left Arm)   Pulse 66   Temp 99.2 F (37.3 C) (Oral)   Resp 18   Ht 5\' 6"  (1.676 m)   Wt 100.7 kg   SpO2 98%   BMI 35.83 kg/m   Physical Exam Vitals and nursing note reviewed.  Constitutional:      General: She is not in acute distress.    Appearance: She is well-developed. She is not diaphoretic.  HENT:     Head: Normocephalic and atraumatic.  Cardiovascular:     Rate and Rhythm: Normal rate and regular rhythm.      Heart sounds: No murmur. No friction rub. No gallop.   Pulmonary:     Effort: Pulmonary effort is normal. No respiratory distress.     Breath sounds: Normal breath sounds. No wheezing.  Abdominal:     General: Bowel sounds are normal. There  is no distension.     Palpations: Abdomen is soft.     Tenderness: There is no abdominal tenderness.  Musculoskeletal:        General: Normal range of motion.     Cervical back: Normal range of motion and neck supple.     Right lower leg: No tenderness. No edema.     Left lower leg: No tenderness. No edema.     Comments: Homans sign is absent bilaterally.  Skin:    General: Skin is warm and dry.  Neurological:     Mental Status: She is alert and oriented to person, place, and time.     ED Results / Procedures / Treatments   Labs (all labs ordered are listed, but only abnormal results are displayed) Labs Reviewed  BASIC METABOLIC PANEL  CBC  TROPONIN I (HIGH SENSITIVITY)    EKG EKG Interpretation  Date/Time:  Sunday October 20 2019 10:05:20 EDT Ventricular Rate:  62 PR Interval:    QRS Duration: 152 QT Interval:  423 QTC Calculation: 430 R Axis:   -49 Text Interpretation: Sinus rhythm Right bundle branch block No prior ecg for comparison Confirmed by Geoffery Lyons ([id number]) on 10/20/2019 10:08:02 AM   Radiology No results found.  Procedures Procedures (including critical care time)  Medications Ordered in ED Medications - No data to display  ED Course  I have reviewed the triage vital signs and the nursing notes.  Pertinent labs & imaging results that were available during my care of the patient were reviewed by me and considered in my medical decision making (see chart for details).  MDM Rules/Calculators/A&P  Patient is a 59 year old female with multiple cardiac risk factors but no cardiac history presenting with complaints of chest discomfort as described in the HPI.  Patient's troponin x2 has been negative and EKG shows no  acute changes.  She has a right bundle branch block which is old.  Remainder of the work-up unremarkable.  At this point, I feel as though discharge is appropriate.  Patient has seen Dr. Tenny Craw in the cardiology clinic in the past.  I have advised her to call tomorrow morning to expedite follow-up for the next few days.  Patient understands to return if symptoms worsen or change.  Final Clinical Impression(s) / ED Diagnoses Final diagnoses:  None    Rx / DC Orders ED Discharge Orders    None       Geoffery Lyons, MD 10/20/19 1334

## 2019-10-20 NOTE — ED Triage Notes (Signed)
Pt reports 3 episodes of crushing chest pain last night . Lasting approx 2 mins each time. Then woke up with another episode and pt reports she was scared so she came in

## 2019-10-20 NOTE — Discharge Instructions (Signed)
Continue medications as previously prescribed.  Follow up with your cardiologist in the next 1-2 days.  Call in the morning to make these arrangements.  Return to the ER if symptoms worsen or change in the meantime.

## 2019-10-22 ENCOUNTER — Ambulatory Visit: Payer: 59 | Attending: Internal Medicine

## 2019-10-22 DIAGNOSIS — Z23 Encounter for immunization: Secondary | ICD-10-CM

## 2019-10-22 NOTE — Progress Notes (Signed)
   Covid-19 Vaccination Clinic  Name:  Nancy Thornton    MRN: [id number] DOB: [birth year]  10/22/2019  [name] was observed post Covid-19 immunization for 15 minutes without incident. She was provided with Vaccine Information Sheet and instruction to access the V-Safe system.   [name] was instructed to call 911 with any severe reactions post vaccine: Marland Kitchen Difficulty breathing  . Swelling of face and throat  . A fast heartbeat  . A bad rash all over body  . Dizziness and weakness   Immunizations Administered    Name Date Dose VIS Date Route   Moderna COVID-19 Vaccine 10/22/2019  8:38 AM 0.5 mL 06/2019 Intramuscular   Manufacturer: Moderna   Lot: [id number]   NDC: 50757-322-56

## 2019-11-01 ENCOUNTER — Ambulatory Visit (INDEPENDENT_AMBULATORY_CARE_PROVIDER_SITE_OTHER): Payer: 59 | Admitting: Physician Assistant

## 2019-11-01 ENCOUNTER — Telehealth: Payer: Self-pay | Admitting: Family Medicine

## 2019-11-01 ENCOUNTER — Encounter: Payer: Self-pay | Admitting: Physician Assistant

## 2019-11-01 ENCOUNTER — Other Ambulatory Visit: Payer: Self-pay

## 2019-11-01 VITALS — BP 140/76 | HR 72 | Temp 97.5°F | Ht 65.5 in | Wt 221.0 lb

## 2019-11-01 DIAGNOSIS — R079 Chest pain, unspecified: Secondary | ICD-10-CM | POA: Diagnosis not present

## 2019-11-01 DIAGNOSIS — I10 Essential (primary) hypertension: Secondary | ICD-10-CM | POA: Diagnosis not present

## 2019-11-01 NOTE — Patient Instructions (Signed)
Medication Instructions: Your physician recommends that you continue on your current medications as directed. Please refer to the Current Medication list given to you today.   Labwork: None today  Procedures/Testing: Your physician has requested that you have a lexiscan myoview. For further information please visit [email or web address] Please follow instruction sheet, as given.    Follow-Up: Office visit with Randall An, PA-C after test    Any Additional Special Instructions Will Be Listed Below (If Applicable).     If you need a refill on your cardiac medications before your next appointment, please call your pharmacy.       Thank you for choosing [facility] !

## 2019-11-01 NOTE — Progress Notes (Signed)
Cardiology Office Note   Date:  11/01/2019   ID:  Nancy Thornton, DOB March 24, 1961, MRN [id number]  PCP:  [name], DO Cardiologist:  Dorris Carnes, MD 04/05/2019 televisit Electrphysiologist: None Rosaria Ferries, PA-C    History of Present Illness: Nancy Thornton is a 59 y.o. female with a history of RBBB, DM, HTN, HLD, asthma, depression, obesity, atypical CP.   10/20/2019 ER visit for chest pain, Ez neg MI and ECG not acute>>office f/u.   Nancy Thornton presents for cardiology follow up.   She had several episodes over the weekend of a squeezing chest pain, 5/10, lasting about a minute. No associated sx. Not aware if she had an increase w/ deep inspiration, does not think she took a deep breath when she was having the pain.   After resting overnight, she had another episode of CP the next am. That is what made her go to the ER. She has not had any sx since then.   This CP was unlike any other CP she has had.   She lost her job due to [place], not working makes her less active. She takes the dogs to the park, so walks sometimes.   She had back surgery, so cannot do the job she was doing. She still has pain going down her L leg from that. She has had problems w/ pain control after that, but is a little better now.  Because of her back, she cannot stand or walk for long periods of time.  Although her activity level is not that high, she has no history of exertional symptoms.  No lower extremity edema, no orthopnea or PND.  No palpitations, no presyncope or syncope.  She has started smoking again, a pack will last her 2-3 days.   Past Medical History:  Diagnosis Date  . Anal fissure    history of  . Asthma   . Depression   . Diabetes mellitus without complication ([place])   . DM (diabetes mellitus) ([place]) 10/19/2012  . Hypertension   . Lichen    hands and feet  . Obesity   . 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 ANTERIOR LUMBAR FUSION N/A 03/06/2019   Procedure: L4-5 XLIF, L4-5 PSF, LEFT SYNOVIAL CYST RESECTION L4-5;  Surgeon: Meade Maw, MD;  Location: [facility] ORS;  Service: Neurosurgery;  Laterality: N/A;  . BACK SURGERY    . CHOLECYSTECTOMY    . CYSTOSTOMY W/ BLADDER BIOPSY  1990   punctured hole in bladder, had open procedure to repair  . MENISCUS REPAIR    . TONSILLECTOMY    . TOTAL VAGINAL HYSTERECTOMY     59 y/o. bleeding. ovaries left in situ    Current Outpatient Medications  Medication Sig Dispense Refill  . acetaminophen (TYLENOL) 500 MG tablet Take 1,000 mg by mouth every 6 (six) hours as needed (pain.).    Marland Kitchen albuterol (PROAIR HFA) 108 (90 Base) MCG/ACT inhaler Inhale 2 puffs into the lungs every 6 (six) hours as needed for wheezing or shortness of breath. 3.7 g 6  . amLODipine (NORVASC) 2.5 MG tablet Take 1 tablet by mouth once daily 90 tablet 3  . atenolol (TENORMIN) 50 MG tablet Take 1 tablet by mouth once daily 90 tablet 1  . atorvastatin (LIPITOR) 10 MG tablet Take 1 tablet by mouth once daily 90 tablet 3  . budesonide-formoterol (SYMBICORT) 160-4.5 MCG/ACT inhaler  Inhale 2 puffs into the lungs 2 (two) times daily. 1 Inhaler 3  . gabapentin (NEURONTIN) 300 MG capsule Take 1 capsule (300 mg total) by mouth 3 (three) times daily. 90 capsule 3  . lisinopril-hydrochlorothiazide (ZESTORETIC) 20-25 MG tablet Take 1 tablet by mouth daily. 90 tablet 3  . Melatonin 5 MG TABS Take 5 mg by mouth at bedtime.    . metFORMIN (GLUCOPHAGE) 1000 MG tablet TAKE 1 TABLET BY MOUTH TWICE DAILY WITH A MEAL 180 tablet 3  . Omega-3 Fatty Acids (FISH OIL) 1000 MG CAPS Take by mouth.    Marland Kitchen tiZANidine (ZANAFLEX) 4 MG tablet Take 0.5-1 tablets (2-4 mg total) by mouth every 8 (eight) hours as needed for muscle spasms. 90 tablet 1   No current facility-administered medications for this visit.    Allergies:   Penicillins, Meloxicam, and Bactrim  [sulfamethoxazole-trimethoprim]    Social History:  The patient  reports that she has been smoking cigarettes. She started smoking about 42 years ago. She has been smoking about 0.50 packs per day. She has never used smokeless tobacco. She reports that she does not drink alcohol or use drugs.   Family History:  The patient's family history includes Diabetes in her mother; Hypertension in her father.  She indicated that her mother is alive. She indicated that her father is deceased. She indicated that the status of her neg hx is unknown.   ROS:  Please see the history of present illness. All other systems are reviewed and negative.    PHYSICAL EXAM: VS:  BP 140/76   Pulse 72   Temp (!) 97.5 F (36.4 C)   Ht 5' 5.5" (1.664 m)   Wt 221 lb (100.2 kg)   SpO2 96%   BMI 36.22 kg/m  , BMI Body mass index is 36.22 kg/m. GEN: Well nourished, well developed, female in no acute distress HEENT: normal for age  Neck: no JVD, no carotid bruit, no masses Cardiac: RRR; no murmur, no rubs, or gallops Respiratory: Mild expiratory wheezing bilaterally, normal work of breathing GI: soft, nontender, nondistended, + BS MS: no deformity or atrophy; no edema; distal pulses are 2+ in all 4 extremities  Skin: warm and dry, no rash Neuro:  Strength and sensation are intact Psych: euthymic mood, full affect   EKG:  EKG is not ordered today.  ECHO, CATH, MONITOR: none  Recent Labs: 10/03/2019: ALT 21; TSH 1.340 10/20/2019: BUN 17; Creatinine, Ser 0.70; Hemoglobin 15.1; Platelets 235; Potassium 3.8; Sodium 137  CBC    Component Value Date/Time   WBC 7.7 10/20/2019 1020   RBC 4.66 10/20/2019 1020   HGB 15.1 (H) 10/20/2019 1020   HGB 15.7 10/03/2019 1053   HCT 41.9 10/20/2019 1020   HCT 44.0 10/03/2019 1053   PLT 235 10/20/2019 1020   PLT 274 10/03/2019 1053   MCV 89.9 10/20/2019 1020   MCV 94 10/03/2019 1053   MCH 32.4 10/20/2019 1020   MCHC 36.0 10/20/2019 1020   RDW 11.2 (L) 10/20/2019 1020    RDW 12.0 10/03/2019 1053   LYMPHSABS 2.1 09/06/2016 0948   LYMPHSABS 1.7 01/07/2015 1049   MONOABS 0.5 09/06/2016 0948   EOSABS 0.1 09/06/2016 0948   EOSABS 0.1 01/07/2015 1049   BASOSABS 0.1 09/06/2016 0948   BASOSABS 0.0 01/07/2015 1049   CMP Latest Ref Rng & Units 10/20/2019 10/03/2019 03/01/2019  Glucose 70 - 99 mg/dL 643(P) 295(J) 884(Z)  BUN 6 - 20 mg/dL 17 18 18   Creatinine 0.44 - 1.00  mg/dL 0.26 3.78 5.88  Sodium 135 - 145 mmol/L 137 142 140  Potassium 3.5 - 5.1 mmol/L 3.8 4.8 4.3  Chloride 98 - 111 mmol/L 96(L) 98 100  CO2 22 - 32 mmol/L 28 28 29   Calcium 8.9 - 10.3 mg/dL 9.4 9.3  Total Protein 6.0 - 8.5 g/dL - 6.6 -  Total Bilirubin 0.0 - 1.2 mg/dL - 0.7 -  Alkaline Phos 39 - 117 IU/L - 67 -  AST 0 - 40 IU/L - 21 -  ALT 0 - 32 IU/L - 21 -   Lipid Panel Lab Results  Component Value Date   CHOL 128 10/03/2019   HDL 47 10/03/2019   LDLCALC 42 10/03/2019   LDLDIRECT 54 02/16/2018   TRIG 251 (H) 10/03/2019   CHOLHDL 2.7 10/03/2019   Lab Results  Component Value Date   HGBA1C 6.5 10/03/2019     Wt Readings from Last 3 Encounters:  11/01/19 221 lb (100.2 kg)  10/20/19 222 lb (100.7 kg)  10/03/19 221 lb (100.2 kg)     Other studies Reviewed: Additional studies/ records that were reviewed today include: Office notes, hospital records and testing.  ASSESSMENT AND PLAN:  1.  Chest pain: -Although her symptoms are atypical, she has multiple cardiac risk factors -CRF's are age, diabetes, and, obesity -If the Myoview was normal, I feel that no further ischemic evaluation would be needed -Schedule Lexiscan Myoview, follow-up on the results -Continue beta-blocker and statin  2.  Hypertension: -Patient states her blood pressure is generally well controlled, it is a little higher than normal here -Continue amlodipine, atenolol and lisinopril HCTZ  Current medicines are reviewed at length with the patient today.  The patient does not have concerns regarding  medicines.  The following changes have been made:  no change  Labs/ tests ordered today include:   Orders Placed This Encounter  Procedures  . NM Myocar Multi W/Spect W/Wall Motion / EF     Disposition:   FU with 12/03/19, MD  Signed, Dietrich Pates, PA-C  11/01/2019 5:08 PM    [facility] Phone: [phone]; Fax: [phone]

## 2019-11-04 ENCOUNTER — Ambulatory Visit (INDEPENDENT_AMBULATORY_CARE_PROVIDER_SITE_OTHER): Payer: 59 | Admitting: Family Medicine

## 2019-11-04 DIAGNOSIS — K529 Noninfective gastroenteritis and colitis, unspecified: Secondary | ICD-10-CM | POA: Diagnosis not present

## 2019-11-04 NOTE — Progress Notes (Signed)
Telephone visit  Subjective: CC: diarrhea PCP: Raliegh Ip, DO BMW:[id number] Bouton is a 59 y.o. female calls for telephone consult today. Patient provides verbal consent for consult held via phone.  Due to COVID-19 pandemic this visit was conducted virtually. This visit type was conducted due to national recommendations for restrictions regarding the COVID-19 Pandemic (e.g. social distancing, sheltering in place) in an effort to limit this patient's exposure and mitigate transmission in our community. All issues noted in this document were discussed and addressed.  A physical exam was not performed with this format.   Location of patient: home Location of provider: [facility] Others present for call: none  1. Diarrhea Patient with ongoing diarrhea.  She has used probiotics with no improvement. She reports that she "blows her fish pond pump" when it gets clogged and she wonders if she got a parasite doing this.  No unplanned weight loss.  No nausea, vomiting, melena or hematochezia.  No other sick contacts.   ROS: Per HPI  Allergies  Allergen Reactions  . Penicillins Hives    Did it involve swelling of the face/tongue/throat, SOB, or low BP? No Did it involve sudden or severe rash/hives, skin peeling, or any reaction on the inside of your mouth or nose? Yes Did you need to seek medical attention at a hospital or doctor's office? No When did it last happen?Occurred at age 14 If all above answers are "NO", may proceed with cephalosporin use.   . Meloxicam Hypertension  . Bactrim [Sulfamethoxazole-Trimethoprim] Rash   Past Medical History:  Diagnosis Date  . Anal fissure    history of  . Asthma   . Depression   . Diabetes mellitus without complication ([facility])   . DM (diabetes mellitus) ([facility]) 10/19/2012  . Hypertension   . Lichen    hands and feet  . Obesity   . 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   acetaminophen (TYLENOL) 500 MG tablet, Take 1,000 mg by mouth every 6 (six) hours as needed (pain.)., Disp: , Rfl:  .  albuterol (PROAIR HFA) 108 (90 Base) MCG/ACT inhaler, Inhale 2 puffs into the lungs every 6 (six) hours as needed for wheezing or shortness of breath., Disp: 3.7 g, Rfl: 6 .  amLODipine (NORVASC) 2.5 MG tablet, Take 1 tablet by mouth once daily, Disp: 90 tablet, Rfl: 3 .  atenolol (TENORMIN) 50 MG tablet, Take 1 tablet by mouth once daily, Disp: 90 tablet, Rfl: 1 .  atorvastatin (LIPITOR) 10 MG tablet, Take 1 tablet by mouth once daily, Disp: 90 tablet, Rfl: 3 .  budesonide-formoterol (SYMBICORT) 160-4.5 MCG/ACT inhaler, Inhale 2 puffs into the lungs 2 (two) times daily., Disp: 1 Inhaler, Rfl: 3 .  gabapentin (NEURONTIN) 300 MG capsule, Take 1 capsule (300 mg total) by mouth 3 (three) times daily., Disp: 90 capsule, Rfl: 3 .  lisinopril-hydrochlorothiazide (ZESTORETIC) 20-25 MG tablet, Take 1 tablet by mouth daily., Disp: 90 tablet, Rfl: 3 .  Melatonin 5 MG TABS, Take 5 mg by mouth at bedtime., Disp: , Rfl:  .  metFORMIN (GLUCOPHAGE) 1000 MG tablet, TAKE 1 TABLET BY MOUTH TWICE DAILY WITH A MEAL, Disp: 180 tablet, Rfl: 3 .  Omega-3 Fatty Acids (FISH OIL) 1000 MG CAPS, Take by mouth., Disp: , Rfl:  .  tiZANidine (ZANAFLEX) 4 MG tablet, Take 0.5-1 tablets (2-4 mg total) by mouth every 8 (eight) hours as needed for muscle spasms., Disp: 90 tablet,  Rfl: 1  Assessment/ Plan: 59 y.o. female   1. Chronic diarrhea Evaluate for possible parasite infection.  Doubt COVID related given duration.  Treatment pending results. - Cdiff NAA+O+P+Stool Culture; Future - Fecal fat, qualitative; Future  Start time: 4:47pm End time: 4:55pm  Total time spent on patient care (including telephone call/ virtual visit): 14 minutes  [place], [place] [phone]

## 2019-11-04 NOTE — Telephone Encounter (Signed)
Televisit scheduled.

## 2019-11-06 ENCOUNTER — Other Ambulatory Visit: Payer: 59

## 2019-11-06 DIAGNOSIS — K529 Noninfective gastroenteritis and colitis, unspecified: Secondary | ICD-10-CM

## 2019-11-06 DIAGNOSIS — Z1211 Encounter for screening for malignant neoplasm of colon: Secondary | ICD-10-CM

## 2019-11-06 NOTE — Addendum Note (Signed)
Addended by: Prescott Gum on: 11/06/2019 11:54 AM   Modules accepted: Orders

## 2019-11-07 LAB — FECAL OCCULT BLOOD, IMMUNOCHEMICAL: Fecal Occult Bld: NEGATIVE

## 2019-11-14 ENCOUNTER — Encounter (HOSPITAL_BASED_OUTPATIENT_CLINIC_OR_DEPARTMENT_OTHER)
Admission: RE | Admit: 2019-11-14 | Discharge: 2019-11-14 | Disposition: A | Discharge status: Home / Self Care | Payer: 59 | Source: Ambulatory Visit | Attending: Physician Assistant | Admitting: Physician Assistant

## 2019-11-14 ENCOUNTER — Encounter (HOSPITAL_COMMUNITY): Payer: Self-pay

## 2019-11-14 ENCOUNTER — Other Ambulatory Visit: Payer: 59

## 2019-11-14 ENCOUNTER — Other Ambulatory Visit: Payer: Self-pay

## 2019-11-14 ENCOUNTER — Encounter (HOSPITAL_COMMUNITY)
Admission: RE | Admit: 2019-11-14 | Discharge: 2019-11-14 | Disposition: A | Discharge status: Home / Self Care | Payer: 59 | Source: Ambulatory Visit | Attending: Physician Assistant | Admitting: Physician Assistant

## 2019-11-14 DIAGNOSIS — R079 Chest pain, unspecified: Secondary | ICD-10-CM | POA: Diagnosis present

## 2019-11-14 LAB — NM MYOCAR MULTI W/SPECT W/WALL MOTION / EF
LV dias vol: 66 mL (ref 46–106)
LV sys vol: 19 mL
Peak HR: 82 {beats}/min
RATE: 0.26
Rest HR: 54 {beats}/min
SDS: 2
SRS: 5
SSS: 7
TID: 0.82

## 2019-11-14 MED ORDER — SODIUM CHLORIDE FLUSH 0.9 % IV SOLN
INTRAVENOUS | Status: AC
  Administered 2019-11-14: 10 mL via INTRAVENOUS
  Filled 2019-11-14: qty 10

## 2019-11-14 MED ORDER — REGADENOSON 0.4 MG/5ML IV SOLN
INTRAVENOUS | Status: AC
  Administered 2019-11-14: 0.4 mg via INTRAVENOUS
  Filled 2019-11-14: qty 5

## 2019-11-14 MED ORDER — TECHNETIUM TC 99M TETROFOSMIN IV KIT
10.0000 | PACK | Freq: Once | INTRAVENOUS | Status: AC | PRN
  Administered 2019-11-14: 11 via INTRAVENOUS

## 2019-11-14 MED ORDER — TECHNETIUM TC 99M TETROFOSMIN IV KIT
30.0000 | PACK | Freq: Once | INTRAVENOUS | Status: AC | PRN
  Administered 2019-11-14: 32 via INTRAVENOUS

## 2019-11-15 ENCOUNTER — Encounter: Payer: Self-pay | Admitting: Student

## 2019-11-15 ENCOUNTER — Ambulatory Visit (INDEPENDENT_AMBULATORY_CARE_PROVIDER_SITE_OTHER): Payer: 59 | Admitting: Student

## 2019-11-15 VITALS — BP 120/76 | HR 70 | Wt 220.0 lb

## 2019-11-15 DIAGNOSIS — E785 Hyperlipidemia, unspecified: Secondary | ICD-10-CM | POA: Diagnosis not present

## 2019-11-15 DIAGNOSIS — R072 Precordial pain: Secondary | ICD-10-CM | POA: Diagnosis not present

## 2019-11-15 DIAGNOSIS — I10 Essential (primary) hypertension: Secondary | ICD-10-CM

## 2019-11-15 NOTE — Progress Notes (Signed)
Cardiology Office Note    Date:  11/15/2019   ID:  Nancy Thornton, DOB Jan 19, 1961, MRN [id number]  PCP:  Raliegh Ip, DO  Cardiologist: Dietrich Pates, MD    Chief Complaint  Patient presents with  . Follow-up    s/p stress test    History of Present Illness:    Nancy Thornton is a 59 y.o. female with past medical history of HTN, HLD, Type 2 DM and asthma who presents to the office today for follow-up from a recent stress test.  She was last examined by Theodore Demark, PA-C on 11/01/2019 and had recently been evaluated in the Emergency Department for chest pain. EKG was without acute ST abnormalities and HS Troponin values were negative. She denied any recurrent pain since ED evaluation but was not as active at baseline as previously due to changes in her job. Given her multiple cardiac risk factors, a Lexiscan Myoview was recommended for further evaluation.  This was performed on 11/14/2019 and showed no significant EKG changes. There were no perfusion defects consistent with prior infarct or ischemia and pumping function of the heart was preserved. The study was overall low risk.  In talking the patient today, she reports overall doing well since her last office visit. She denies any recurrent chest pain. No recent orthopnea, PND or edema.  BP has been well controlled when checked at home and is at 120/76 during today's visit. She reports good compliance with her current medication regimen.   Past Medical History:  Diagnosis Date  . Anal fissure    history of  . Asthma   . Depression   . Diabetes mellitus without complication ([facility])   . DM (diabetes mellitus) ([facility]) 10/19/2012  . Hypertension   . Lichen    hands and feet  . Obesity   . 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 ANTERIOR LUMBAR FUSION N/A 03/06/2019   Procedure: L4-5 XLIF, L4-5 PSF, LEFT SYNOVIAL CYST RESECTION L4-5;  Surgeon:  Venetia Night, MD;  Location: [facility] ORS;  Service: Neurosurgery;  Laterality: N/A;  . BACK SURGERY    . CHOLECYSTECTOMY    . CYSTOSTOMY W/ BLADDER BIOPSY  1990   punctured hole in bladder, had open procedure to repair  . MENISCUS REPAIR    . TONSILLECTOMY    . TOTAL VAGINAL HYSTERECTOMY     59 y/o. bleeding. ovaries left in situ    Current Medications: Outpatient Medications Prior to Visit  Medication Sig Dispense Refill  . acetaminophen (TYLENOL) 500 MG tablet Take 1,000 mg by mouth every 6 (six) hours as needed (pain.).    Marland Kitchen albuterol (PROAIR HFA) 108 (90 Base) MCG/ACT inhaler Inhale 2 puffs into the lungs every 6 (six) hours as needed for wheezing or shortness of breath. 3.7 g 6  . amLODipine (NORVASC) 2.5 MG tablet Take 1 tablet by mouth once daily 90 tablet 3  . atenolol (TENORMIN) 50 MG tablet Take 1 tablet by mouth once daily 90 tablet 1  . atorvastatin (LIPITOR) 10 MG tablet Take 1 tablet by mouth once daily 90 tablet 3  . budesonide-formoterol (SYMBICORT) 160-4.5 MCG/ACT inhaler Inhale 2 puffs into the lungs 2 (two) times daily. 1 Inhaler 3  . gabapentin (NEURONTIN) 300 MG capsule Take 1 capsule (300 mg total) by mouth 3 (three) times daily. 90 capsule 3  . lisinopril-hydrochlorothiazide (ZESTORETIC) 20-25 MG tablet Take 1 tablet by  mouth daily. 90 tablet 3  . Melatonin 5 MG TABS Take 5 mg by mouth at bedtime.    . metFORMIN (GLUCOPHAGE) 1000 MG tablet TAKE 1 TABLET BY MOUTH TWICE DAILY WITH A MEAL 180 tablet 3  . Omega-3 Fatty Acids (FISH OIL) 1000 MG CAPS Take by mouth.    Marland Kitchen tiZANidine (ZANAFLEX) 4 MG tablet Take 0.5-1 tablets (2-4 mg total) by mouth every 8 (eight) hours as needed for muscle spasms. 90 tablet 1   No facility-administered medications prior to visit.     Allergies:   Penicillins, Meloxicam, and Bactrim [sulfamethoxazole-trimethoprim]   Social History   Socioeconomic History  . Marital status: Divorced    Spouse name: Not on file  . Number of  children: 1  . Years of education: Not on file  . Highest education level: Not on file  Occupational History  . 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  Tobacco Use  . Smoking status: Light Tobacco Smoker    Packs/day: 0.50    Types: Cigarettes    Start date: 10/18/1977    Last attempt to quit: 02/10/2019    Years since quitting: 0.7  . Smokeless tobacco: Never Used  Substance and Sexual Activity  . Alcohol use: No  . Drug use: No  . Sexual activity: Not Currently  Other Topics Concern  . Not on file  Social History Narrative  . Not on file   Social Determinants of Health   Financial Resource Strain:   . Difficulty of Paying Living Expenses:   Food Insecurity:   . Worried About Programme researcher, broadcasting/film/video in the Last Year:   . Barista in the Last Year:   Transportation Needs:   . Freight forwarder (Medical):   Marland Kitchen Lack of Transportation (Non-Medical):   Physical Activity:   . Days of Exercise per Week:   . Minutes of Exercise per Session:   Stress:   . Feeling of Stress :   Social Connections:   . Frequency of Communication with Friends and Family:   . Frequency of Social Gatherings with Friends and Family:   . Attends Religious Services:   . Active Member of Clubs or Organizations:   . Attends Banker Meetings:   Marland Kitchen Marital Status:      Family History:  The patient's family history includes Diabetes in her mother; Hypertension in her father.   Review of Systems:   Please see the history of present illness.     General:  No chills, fever, night sweats or weight changes.  Cardiovascular:  No chest pain, dyspnea on exertion, edema, orthopnea, palpitations, paroxysmal nocturnal dyspnea. Dermatological: No rash, lesions/masses Respiratory: No cough, dyspnea Urologic: No hematuria, dysuria Abdominal:   No nausea, vomiting,  bright red blood per rectum, melena, or hematemesis Positive for diarrhea (being worked up by her PCP).  Neurologic:   No visual changes, wkns, changes in mental status.  All other systems reviewed and are otherwise negative except as noted above.   Physical Exam:    VS:  BP 120/76   Pulse 70   Wt 220 lb (99.8 kg)   SpO2 95%   BMI 36.05 kg/m    General: Well developed, well nourished,female appearing in no acute distress. Head: Normocephalic, atraumatic, sclera non-icteric.  Neck: No carotid bruits. JVD not elevated.  Lungs: Respirations regular and unlabored, without wheezes or rales.  Heart: Regular rate and rhythm. No S3 or S4.  No murmur, no rubs,  or gallops appreciated. Abdomen: Soft, non-tender, non-distended. No obvious abdominal masses. Msk:  Strength and tone appear normal for age. No obvious joint deformities or effusions. Extremities: No clubbing or cyanosis. No lower extremity edema.  Distal pedal pulses are 2+ bilaterally. Neuro: Alert and oriented X 3. Moves all extremities spontaneously. No focal deficits noted. Psych:  Responds to questions appropriately with a normal affect. Skin: No rashes or lesions noted  Wt Readings from Last 3 Encounters:  11/15/19 220 lb (99.8 kg)  11/01/19 221 lb (100.2 kg)  10/20/19 222 lb (100.7 kg)     Studies/Labs Reviewed:   EKG:  EKG is not ordered today.   Recent Labs: 10/03/2019: ALT 21; TSH 1.340 10/20/2019: BUN 17; Creatinine, Ser 0.70; Hemoglobin 15.1; Platelets 235; Potassium 3.8; Sodium 137   Lipid Panel    Component Value Date/Time   CHOL 128 10/03/2019 1053   CHOL 196 01/17/2013 1226   TRIG 251 (H) 10/03/2019 1053   TRIG 233 (H) 09/05/2013 1142   TRIG 176 (H) 01/17/2013 1226   HDL 47 10/03/2019 1053   HDL 58 09/05/2013 1142   HDL 57 01/17/2013 1226   CHOLHDL 2.7 10/03/2019 1053   LDLCALC 42 10/03/2019 1053   LDLCALC 58 09/05/2013 1142   LDLCALC 104 (H) 01/17/2013 1226   LDLDIRECT 54 02/16/2018 0903    Additional studies/ records that were reviewed today include:   NST: 11/14/2019  There was no ST segment deviation noted  during stress.  The study is normal. There are no perfusion defects consistent with prior infarct or current ischemia.  This is a low risk study.  The left ventricular ejection fraction is hyperdynamic (>65%).  Assessment:    1. Precordial chest pain   2. Essential hypertension   3. Hyperlipidemia LDL goal <70      Plan:   In order of problems listed above:  1. Precordial Chest Pain -The patient experienced episodes of chest pain last month and ED evaluation at that time was reassuring with EKG showing no acute changes and troponin values were negative. Recent NST performed yesterday showed no significant EKG changes and there were no perfusion defects. Was overall a low-risk study. - Reviewed results in detail with the patient today. She denies any recurrent symptoms since last month. Continue with risk factor modification.  2. HTN  - BP is well controlled at 120/76 during today's visit. Continue current regimen with Amlodipine 2.5 mg daily, Atenolol 50 mg daily and Lisinopril-HCTZ 20-25 mg daily.  3. HLD - FLP in 10/2019 showed total cholesterol 128, triglycerides 251, HDL 47 and LDL 42. Continue Atorvastatin 10 mg daily and Fish Oil.  4. Type 2 DM - Hgb A1c improved to 6.5 by recent labs in 10/2019. Followed by her PCP. She remains on Metformin.    Medication Adjustments/Labs and Tests Ordered: Current medicines are reviewed at length with the patient today.  Concerns regarding medicines are outlined above.  Medication changes, Labs and Tests ordered today are listed in the Patient Instructions below. Patient Instructions  Medication Instructions:  Your physician recommends that you continue on your current medications as directed. Please refer to the Current Medication list given to you today.  *If you need a refill on your cardiac medications before your next appointment, please call your pharmacy*   Lab Work: NONE   If you have labs (blood work) drawn today and  your tests are completely normal, you will receive your results only by: Marland Kitchen MyChart Message (if you have MyChart)  OR . A paper copy in the mail If you have any lab test that is abnormal or we need to change your treatment, we will call you to review the results.   Testing/Procedures: NONE     Follow-Up: At [facility], you and your health needs are our priority.  As part of our continuing mission to provide you with exceptional heart care, we have created designated Provider Care Teams.  These Care Teams include your primary Cardiologist (physician) and Advanced Practice Providers (APPs -  Physician Assistants and Nurse Practitioners) who all work together to provide you with the care you need, when you need it.  We recommend signing up for the patient portal called "MyChart".  Sign up information is provided on this After Visit Summary.  MyChart is used to connect with patients for Virtual Visits (Telemedicine).  Patients are able to view lab/test results, encounter notes, upcoming appointments, etc.  Non-urgent messages can be sent to your provider as well.   To learn more about what you can do with MyChart, go to [email or web address].    Your next appointment:   1 year(s)  The format for your next appointment:   In Person  Provider:   Dietrich Pates, MD   Other Instructions Thank you for choosing [facility]!       Signed, Ellsworth Lennox, PA-C  11/15/2019 4:47 PM    [facility] [address] [place], [place] [address] Phone: [phone] Fax: [phone]

## 2019-11-15 NOTE — Patient Instructions (Signed)
Medication Instructions:  Your physician recommends that you continue on your current medications as directed. Please refer to the Current Medication list given to you today.  *If you need a refill on your cardiac medications before your next appointment, please call your pharmacy*   Lab Work: NONE  If you have labs (blood work) drawn today and your tests are completely normal, you will receive your results only by: MyChart Message (if you have MyChart) OR A paper copy in the mail If you have any lab test that is abnormal or we need to change your treatment, we will call you to review the results.   Testing/Procedures: NONE    Follow-Up: At [facility], you and your health needs are our priority.  As part of our continuing mission to provide you with exceptional heart care, we have created designated Provider Care Teams.  These Care Teams include your primary Cardiologist (physician) and Advanced Practice Providers (APPs -  Physician Assistants and Nurse Practitioners) who all work together to provide you with the care you need, when you need it.  We recommend signing up for the patient portal called "MyChart".  Sign up information is provided on this After Visit Summary.  MyChart is used to connect with patients for Virtual Visits (Telemedicine).  Patients are able to view lab/test results, encounter notes, upcoming appointments, etc.  Non-urgent messages can be sent to your provider as well.   To learn more about what you can do with MyChart, go to [email or web address].com.    Your next appointment:   1 year(s)  The format for your next appointment:   In Person  Provider:   Paula Ross, MD   Other Instructions Thank you for choosing [facility]!    

## 2019-11-18 LAB — CDIFF NAA+O+P+STOOL CULTURE
E coli, Shiga toxin Assay: NEGATIVE
Toxigenic C. Difficile by PCR: NEGATIVE

## 2019-11-18 LAB — FECAL FAT, QUALITATIVE
Fat Qual Neutral, Stl: NORMAL
Fat Qual Total, Stl: NORMAL

## 2019-11-20 ENCOUNTER — Telehealth: Payer: Self-pay | Admitting: Family Medicine

## 2019-11-20 ENCOUNTER — Other Ambulatory Visit: Payer: Self-pay | Admitting: Family Medicine

## 2019-11-20 ENCOUNTER — Encounter: Payer: Self-pay | Admitting: Family Medicine

## 2019-11-20 DIAGNOSIS — K529 Noninfective gastroenteritis and colitis, unspecified: Secondary | ICD-10-CM

## 2019-11-20 NOTE — Telephone Encounter (Signed)
done

## 2019-11-20 NOTE — Telephone Encounter (Signed)
  REFERRAL REQUEST Telephone Note 11/20/2019  What type of referral do you need? Gatroenterology  Have you been seen at our office for this problem? Yes 11/04/2019 (Advise that they may need an appointment with their PCP before a referral can be done)  Is there a particular doctor or location that you prefer? Dr. Karilyn Cota - Which is covered under her insurance per Patient.  Patient notified that referrals can take up to a week or longer to process. If they haven't heard anything within a week they should call back and speak with the referral department.

## 2019-11-22 ENCOUNTER — Encounter (INDEPENDENT_AMBULATORY_CARE_PROVIDER_SITE_OTHER): Payer: Self-pay | Admitting: Gastroenterology

## 2019-11-28 ENCOUNTER — Ambulatory Visit (INDEPENDENT_AMBULATORY_CARE_PROVIDER_SITE_OTHER): Payer: 59

## 2019-11-28 ENCOUNTER — Encounter: Payer: Self-pay | Admitting: Nurse Practitioner

## 2019-11-28 ENCOUNTER — Other Ambulatory Visit: Payer: Self-pay

## 2019-11-28 ENCOUNTER — Ambulatory Visit (INDEPENDENT_AMBULATORY_CARE_PROVIDER_SITE_OTHER): Payer: 59 | Admitting: Nurse Practitioner

## 2019-11-28 ENCOUNTER — Ambulatory Visit: Payer: 59 | Admitting: Family Medicine

## 2019-11-28 VITALS — BP 101/74 | HR 92 | Temp 97.1°F | Resp 20 | Ht 65.0 in | Wt 219.0 lb

## 2019-11-28 DIAGNOSIS — R109 Unspecified abdominal pain: Secondary | ICD-10-CM | POA: Diagnosis not present

## 2019-11-28 LAB — MICROSCOPIC EXAMINATION
RBC, Urine: NONE SEEN /hpf (ref 0–2)
Renal Epithel, UA: NONE SEEN /hpf

## 2019-11-28 LAB — URINALYSIS, COMPLETE
Bilirubin, UA: NEGATIVE
Leukocytes,UA: NEGATIVE
Nitrite, UA: NEGATIVE
Protein,UA: NEGATIVE
RBC, UA: NEGATIVE
Specific Gravity, UA: 1.025 (ref 1.005–1.030)
Urobilinogen, Ur: 0.2 mg/dL (ref 0.2–1.0)
pH, UA: 5 (ref 5.0–7.5)

## 2019-11-28 MED ORDER — METHYLPREDNISOLONE ACETATE 80 MG/ML IJ SUSP
80.0000 mg | Freq: Once | INTRAMUSCULAR | Status: AC
  Administered 2019-11-28: 80 mg via INTRAMUSCULAR

## 2019-11-28 NOTE — Progress Notes (Signed)
   Subjective:    Patient ID: Nancy Thornton, female    DOB: 02-Aug-1960, 59 y.o.   MRN: [id number]   Chief Complaint: Flank Pain (Right side), Nausea, and Right little toe pain   HPI Patient come in c/o  chronic low back pain- has had surgery which did not help. Has had back pain chronic since her surgery. She has been seeing chiropractor which has not helped. Now has flank pain around right side. Any movement increases pain. Rates pain 8-9/10 but only when he move. Thi morning pain seemed a little more intense. She took tylenol which helped some. She say this pain Is different then her back pain. Denies rash.  Review of Systems  Constitutional: Negative for diaphoresis.  Eyes: Negative for pain.  Respiratory: Negative for shortness of breath.   Cardiovascular: Negative for chest pain, palpitations and leg swelling.  Gastrointestinal: Negative for abdominal pain.  Endocrine: Negative for polydipsia.  Skin: Negative for rash.  Neurological: Negative for dizziness, weakness and headaches.  Hematological: Does not bruise/bleed easily.  All other systems reviewed and are negative.      Objective:   Physical Exam Vitals and nursing note reviewed.  Constitutional:      Appearance: Normal appearance.  Cardiovascular:     Rate and Rhythm: Normal rate and regular rhythm.     Heart sounds: Normal heart sounds.  Pulmonary:     Breath sounds: Stridor present.  Abdominal:     Palpations: There is no mass.     Tenderness: There is no abdominal tenderness. There is right CVA tenderness. There is no guarding.     Hernia: No hernia is present.  Skin:    General: Skin is warm.  Neurological:     General: No focal deficit present.     Mental Status: She is alert.  Psychiatric:        Mood and Affect: Mood normal.        Behavior: Behavior normal.   Blood pressure 101/74, pulse 92, temperature (!) 97.1 F (36.2 C), temperature source Temporal, resp. rate 20, height 5\' 5"  (1.651 m), weight  219 lb (99.3 kg), SpO2 98 %.  KUB- Right kidney not easily visible due to stool in descending colon-Preliminary reading by , FNP  [facility]  Urine- clear       Assessment & Plan:  [facility] in today with chief complaint of Flank Pain (Right side), Nausea, and Right little toe pain   1. Acute right flank pain moist heat rest - Urinalysis, Complete - DG Abd 1 View - methylPREDNISolone acetate (DEPO-MEDROL) injection 80 mg    The above assessment and management plan was discussed with the patient. The patient verbalized understanding of and has agreed to the management plan. Patient is aware to call the clinic if symptoms persist or worsen. Patient is aware when to return to the clinic for a follow-up visit. Patient educated on when it is appropriate to go to the emergency department.   Nancy Nancy Leash, FNP

## 2019-11-28 NOTE — Patient Instructions (Signed)
Flank Pain, Adult Flank pain is pain in your side. The flank is the area of your side between your upper belly (abdomen) and your back. The pain may occur over a short time (acute), or it may be long-term or come back often (chronic). It may be mild or very bad. Pain in this area can be caused by many different things. Follow these instructions at home:   Drink enough fluid to keep your pee (urine) clear or pale yellow.  Rest as told by your doctor.  Take over-the-counter and prescription medicines only as told by your doctor.  Keep a journal to keep track of: ? What has caused your flank pain. ? What has made it feel better.  Keep all follow-up visits as told by your doctor. This is important. Contact a doctor if:  Medicine does not help your pain.  You have new symptoms.  Your pain gets worse.  You have a fever.  Your symptoms last longer than 2-3 days.  You have trouble peeing.  You are peeing more often than normal. Get help right away if:  You have trouble breathing.  You are short of breath.  Your belly hurts, or it is swollen or red.  You feel sick to your stomach (nauseous).  You throw up (vomit).  You feel like you will pass out, or you do pass out (faint).  You have blood in your pee. Summary  Flank pain is pain in your side. The flank is the area of your side between your upper belly (abdomen) and your back.  Flank pain may occur over a short time (acute), or it may be long-term or come back often (chronic). It may be mild or very bad.  Pain in this area can be caused by many different things.  Contact your doctor if your symptoms get worse or they last longer than 2-3 days. This information is not intended to replace advice given to you by your health care provider. Make sure you discuss any questions you have with your health care provider. Document Revised: 06/02/2017 Document Reviewed: 10/10/2016 Elsevier Patient Education  2020 Elsevier  Inc.  

## 2019-11-29 ENCOUNTER — Telehealth: Payer: Self-pay

## 2019-11-29 NOTE — Telephone Encounter (Signed)
Patient was seen yesterday for flank pain and had abd x-rays.  Patient is calling for results.  Please review and advise.

## 2019-11-29 NOTE — Telephone Encounter (Signed)
Pt aware.

## 2019-11-29 NOTE — Telephone Encounter (Signed)
Results are already in chart- were negative

## 2019-12-16 ENCOUNTER — Other Ambulatory Visit: Payer: Self-pay | Admitting: Internal Medicine

## 2019-12-24 ENCOUNTER — Ambulatory Visit: Payer: 59 | Admitting: Nurse Practitioner

## 2020-01-09 ENCOUNTER — Ambulatory Visit (INDEPENDENT_AMBULATORY_CARE_PROVIDER_SITE_OTHER): Payer: 59 | Admitting: Gastroenterology

## 2020-01-09 ENCOUNTER — Encounter (INDEPENDENT_AMBULATORY_CARE_PROVIDER_SITE_OTHER): Payer: Self-pay | Admitting: *Deleted

## 2020-01-09 ENCOUNTER — Telehealth (INDEPENDENT_AMBULATORY_CARE_PROVIDER_SITE_OTHER): Payer: Self-pay | Admitting: *Deleted

## 2020-01-09 ENCOUNTER — Other Ambulatory Visit: Payer: Self-pay

## 2020-01-09 ENCOUNTER — Encounter (INDEPENDENT_AMBULATORY_CARE_PROVIDER_SITE_OTHER): Payer: Self-pay | Admitting: Gastroenterology

## 2020-01-09 VITALS — BP 121/78 | HR 57 | Temp 99.0°F | Ht 65.5 in | Wt 216.9 lb

## 2020-01-09 DIAGNOSIS — K529 Noninfective gastroenteritis and colitis, unspecified: Secondary | ICD-10-CM | POA: Insufficient documentation

## 2020-01-09 DIAGNOSIS — K760 Fatty (change of) liver, not elsewhere classified: Secondary | ICD-10-CM | POA: Diagnosis not present

## 2020-01-09 MED ORDER — SUPREP BOWEL PREP KIT 17.5-3.13-1.6 GM/177ML PO SOLN: 1.0000 | Freq: Once | ORAL | 0 refills | Status: AC

## 2020-01-09 NOTE — Telephone Encounter (Signed)
Patient needs suprep (coupon card) 

## 2020-01-09 NOTE — Progress Notes (Signed)
Nancy Thornton, M.D. Gastroenterology & Hepatology [facility] [address] [place], [place] [address]  [address] [place] [address]  Referring MD: Raliegh Ip, DO  I will communicate my assessment and recommendations to the referring MD via EMR. "Note: Occasional unusual wording and randomly placed punctuation marks may result from the use of speech recognition technology to transcribe this document"  Chief Complaint: Chronic diarrhea  History of Present Illness: Nancy Thornton is a 59 y.o. female with past medical history of asthma, previous depression, diabetes, hypertension, obesity, fatty liver who presents for evaluation of chronic diarrhea.  Patient states that since December 2019 she has presented during episodes of watery bowel movements on a daily basis.  She reports that she usually has 4-5 bowel movements in the morning which are watery in consistency, occasionally she has 1 more bowel movement in the afternoon there is loose.  She also has urgency to have bowel movements and some tenesmus.  He never had any of the symptoms in the past.  Denies any rectal bleeding or melena.  She has never had an episode of soiling and denies having any episodes of diarrhea while sleeping.  She has not tried any over-the-counter medications to control her diarrhea.  Denies any recent travel.  States she has been recently (less than 2 weeks ago) taking ibuprofen 2 to 4 tablets every day for back pain as she thought Tylenol will cause some liver damage.  Patient also reports she has been taking Metformin for at least 10 years, recently her dose was increased to achieve better control of her hemoglobin A1c but she did not notice any difference in the frequency of her bowel movements at that time.   The patient was seen by her PCP on 11/04/2019.  She was reporting ongoing diarrhea without improvement while taking probiotics.  At that time she  was ordered stool testing including culture, ova and parasite and C. difficile, fecal fat.  Lab testing from 11/14/2019 showed normal fecal fat, negative ova and parasite x1, stool culture for C. difficile.  The patient denies having any nausea, vomiting, fever, chills, hematochezia, melena, hematemesis, abdominal distention, abdominal pain, jaundice, pruritus or weight changes.  Of note, the patient was previously seen by gastroenterology in [facility] for evaluation of dysphagia for which she required an EGD as depicted below.  She was also seen for an episode of right upper quadrant abdominal pain for which she underwent CT of the abdomen as described below.  Her symptoms improved after taking empiric trial of Prilosec for 2 weeks.  Most recent abdominal imaging performed in 05/19/2016, she had a CT abdomen and pelvis with contrast showing 1 cm cyst in the segment 6 of the right lobe stable compared to prior, hepatic acidosis, no presence of other alterations.  Patient had colonoscopy and endoscopy performed in 2014.  Colonoscopy showed normal colon, recommended to repeat in 10 years.  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  Small hiatal hernia was present.  FHx: neg for any gastrointestinal/liver disease, no malignancies Social: Smokes 1 pack of cigarettes every 3 days for many years, denies any alcohol or illicit drug use  Past Medical History: Past Medical History:  Diagnosis Date  . Anal fissure    history of  . Asthma   . Depression   .  Diabetes mellitus without complication (HCC)   . DM (diabetes mellitus) (HCC) 10/19/2012  . Hypertension   . Lichen    hands and feet  . Obesity   . 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 ANTERIOR LUMBAR FUSION N/A 03/06/2019   Procedure: L4-5 XLIF, L4-5 PSF, LEFT SYNOVIAL CYST RESECTION L4-5;  Surgeon: Venetia Night, MD;  Location: [facility] ORS;  Service: Neurosurgery;  Laterality: N/A;  . BACK SURGERY    . CHOLECYSTECTOMY    . CYSTOSTOMY W/ BLADDER BIOPSY  1990   punctured hole in bladder, had open procedure to repair  . MENISCUS REPAIR    . TONSILLECTOMY    . TOTAL VAGINAL HYSTERECTOMY     59 y/o. bleeding. ovaries left in situ    Family History: Family History  Problem Relation Age of Onset  . Diabetes Mother   . Hypertension Father   . Colon cancer Neg Hx   . Esophageal cancer Neg Hx   . Rectal cancer Neg Hx   . Stomach cancer Neg Hx     Social History: Social History   Tobacco Use  Smoking Status Light Tobacco Smoker  . Packs/day: 0.50  . Types: Cigarettes  . Start date: 10/18/1977  . Last attempt to quit: 02/10/2019  . Years since quitting: 0.9  Smokeless Tobacco Never Used   Social History   Substance and Sexual Activity  Alcohol Use No   Social History   Substance and Sexual Activity  Drug Use No    Allergies: Allergies  Allergen Reactions  . Penicillins Hives    Did it involve swelling of the face/tongue/throat, SOB, or low BP? No Did it involve sudden or severe rash/hives, skin peeling, or any reaction on the inside of your mouth or nose? Yes Did you need to seek medical attention at a hospital or doctor's office? No When did it last happen?Occurred at age 14 If all above answers are "NO", may proceed with cephalosporin use.   . Meloxicam Hypertension  . Bactrim [Sulfamethoxazole-Trimethoprim] Rash    Medications: Current Outpatient Medications  Medication Sig Dispense Refill  . albuterol (PROAIR HFA) 108 (90 Base) MCG/ACT inhaler Inhale 2 puffs into the lungs every 6 (six) hours as needed for wheezing or shortness of breath. 3.7 g 6  . amLODipine (NORVASC) 2.5 MG tablet Take 1 tablet by mouth once daily 90  tablet 3  . atenolol (TENORMIN) 50 MG tablet Take 1 tablet by mouth once daily 90 tablet 1  . atorvastatin (LIPITOR) 10 MG tablet Take 1 tablet by mouth once daily 90 tablet 3  . gabapentin (NEURONTIN) 300 MG capsule Take 1 capsule (300 mg total) by mouth 3 (three) times daily. 90 capsule 3  . ibuprofen (ADVIL) 200 MG tablet Take 200 mg by mouth in the morning and at bedtime.    Marland Kitchen lisinopril-hydrochlorothiazide (ZESTORETIC) 20-25 MG tablet Take 1 tablet by mouth daily. 90 tablet 3  . Melatonin 5 MG TABS Take 5 mg by mouth at bedtime.    . metFORMIN (GLUCOPHAGE) 1000 MG tablet TAKE 1 TABLET BY MOUTH TWICE DAILY WITH A MEAL 180 tablet 3  . Omega-3 Fatty Acids (FISH OIL) 1000 MG CAPS Take by mouth daily.     Marland Kitchen tiZANidine (ZANAFLEX) 4 MG tablet Take 0.5-1 tablets (2-4 mg total) by mouth every 8 (eight) hours as needed for muscle spasms. 90 tablet 1  No current facility-administered medications for this visit.    Review of Systems: GENERAL: negative for malaise, significant weight loss, night sweats and fever HEENT: No changes in hearing or vision, no nose bleeds or other nasal problems. No trouble swallowing NECK: Negative for lumps, goiter, pain and significant neck swelling RESPIRATORY: Negative for cough, wheezing and shortness of breath CARDIOVASCULAR: Negative for chest pain, leg swelling, palpitations, orthopnea GI: SEE HPI MUSCULOSKELETAL: Negative for joint pain or swelling, back pain, and muscle pain. SKIN: Negative for lesions, rash, and itching. PSYCH: Negative for sleep disturbance, mood disorder and recent psychosocial stressors. HEMATOLOGY Negative for prolonged bleeding, bruising easily, and swollen nodes. ENDOCRINE: Negative for cold or heat intolerance, polyuria, polydipsia and goiter. NEURO: negative for lightheadedness, dizziness, tremor, gait imbalance, syncope and seizures. The remainder of the review of systems is noncontributory.   Physical Exam: BP 121/78 (BP  Location: Right Arm, Patient Position: Sitting, Cuff Size: Large)   Pulse (!) 57   Temp 99 F (37.2 C) (Oral)   Ht 5' 5.5" (1.664 m)   Wt 216 lb 14.4 oz (98.4 kg)   BMI 35.55 kg/m  GENERAL: The patient is AO x3, in no acute distress.  Obese. HEENT: Head is normocephalic and atraumatic. EOMI are intact. Mouth is well hydrated and without lesions. NECK: Supple. No masses LUNGS: Clear to auscultation. No presence of rhonchi/wheezing/rales. Adequate chest expansion HEART: RRR, normal s1 and s2. ABDOMEN: Mildly tender upon palpation of the epigastric area, no guarding, no peritoneal signs, and nondistended. BS +. No masses. EXTREMITIES: Without any cyanosis, clubbing, rash, lesions or edema. NEUROLOGIC: AOx3, no focal motor deficit. SKIN: no jaundice, no rashes   Imaging/Labs: as above  I personally reviewed and interpreted the available labs, imaging and endoscopic files.  Impression and Plan: Nancy Thornton is a 59 y.o. female with past medical history of asthma, previous depression, diabetes, hypertension, obesity, fatty liver who presents for evaluation of chronic diarrhea.  The patient has presented persistent diarrhea with negative infectious work-up and no alterations in her fecal fat that will be consistent with a malabsorptive disorder involving the pancreas.  Other reasons for chronic diarrhea include medication related, the patient could have developed diarrhea due to Metformin and consideration to switch to another antidiabetic medication should be discussed by primary care physician as this could contribute to her current presentation.  On the other hand, the patient has been taking ibuprofen recently which could also lead to worsening diarrhea, she was advised to avoid this medication and to try taking Tylenol instead for back pain.  We will explore other causes of chronic diarrhea such as celiac disease with celiac serology and the patient will be ordered a colonoscopy with random  colonic biopsies to rule out microscopic colitis.  Finally, her symptoms could be attributed to IBS-D, for which she will benefit from implementing a low FODMAP diet as well as avoiding sweeteners.  She can take Imodium as needed to decrease the diarrhea episodes.  On a side note, the patient has a history of fatty liver but her liver function tests have been within normal limits, she was counseled to lose weight to at least 5% of her current weight, the patient understood and agreed.  - Start a low FODMAP diet. - Consider switching metformin to another antidiabetic - discuss with PCP - Avoid sweeteners - Avoid ibuprofen - Schedule colonoscopy with random biopsies - Perform TTG IgA - Take Imodium as needed - Encouraged to lose weight - RTC 2 months  All questions were answered.      Harvel Quale, MD

## 2020-01-09 NOTE — Patient Instructions (Addendum)
Start a low FODMAP diet. Consider switching metformin to another antidiabetic Avoid sweeteners Avoid ibuprofen Schedule colonoscopy with random biopsies Perform blood workup Take Imodium 1 tablet per day

## 2020-01-10 ENCOUNTER — Telehealth (INDEPENDENT_AMBULATORY_CARE_PROVIDER_SITE_OTHER): Payer: Self-pay | Admitting: Gastroenterology

## 2020-01-10 ENCOUNTER — Other Ambulatory Visit (INDEPENDENT_AMBULATORY_CARE_PROVIDER_SITE_OTHER): Payer: Self-pay | Admitting: *Deleted

## 2020-01-10 LAB — TISSUE TRANSGLUTAMINASE, IGA: (tTG) Ab, IgA: 1 U/mL

## 2020-01-10 LAB — IGA: Immunoglobulin A: 152 mg/dL (ref 47–310)

## 2020-01-10 NOTE — Telephone Encounter (Signed)
Called the patient today to inform her about the results of celiac serology which was negative.  Patient did not pick up the phone left voice message with details.  Dolores Frame, MD Gastroenterology and Hepatology

## 2020-01-16 ENCOUNTER — Other Ambulatory Visit: Payer: Self-pay

## 2020-01-16 ENCOUNTER — Encounter (HOSPITAL_COMMUNITY)
Admission: RE | Admit: 2020-01-16 | Discharge: 2020-01-16 | Disposition: A | Discharge status: Home / Self Care | Payer: 59 | Source: Ambulatory Visit | Attending: Gastroenterology | Admitting: Gastroenterology

## 2020-01-20 ENCOUNTER — Other Ambulatory Visit: Payer: Self-pay | Admitting: Family Medicine

## 2020-01-20 DIAGNOSIS — E1159 Type 2 diabetes mellitus with other circulatory complications: Secondary | ICD-10-CM

## 2020-01-20 DIAGNOSIS — I152 Hypertension secondary to endocrine disorders: Secondary | ICD-10-CM

## 2020-01-21 NOTE — Telephone Encounter (Signed)
appt scheduled with PCP 02/04/20

## 2020-01-24 ENCOUNTER — Other Ambulatory Visit (HOSPITAL_COMMUNITY)
Admission: RE | Admit: 2020-01-24 | Discharge: 2020-01-24 | Disposition: A | Discharge status: Home / Self Care | Payer: 59 | Source: Ambulatory Visit | Attending: Gastroenterology | Admitting: Gastroenterology

## 2020-01-24 ENCOUNTER — Other Ambulatory Visit: Payer: Self-pay

## 2020-01-24 DIAGNOSIS — Z20822 Contact with and (suspected) exposure to covid-19: Secondary | ICD-10-CM | POA: Diagnosis not present

## 2020-01-24 DIAGNOSIS — Z01812 Encounter for preprocedural laboratory examination: Secondary | ICD-10-CM | POA: Insufficient documentation

## 2020-01-25 LAB — SARS CORONAVIRUS 2 (TAT 6-24 HRS): SARS Coronavirus 2: NEGATIVE

## 2020-01-28 ENCOUNTER — Encounter (HOSPITAL_COMMUNITY)
Admission: RE | Disposition: A | Discharge status: Home / Self Care | Payer: Self-pay | Source: Home / Self Care | Attending: Gastroenterology

## 2020-01-28 ENCOUNTER — Other Ambulatory Visit: Payer: Self-pay

## 2020-01-28 ENCOUNTER — Encounter (HOSPITAL_COMMUNITY): Payer: Self-pay | Admitting: Gastroenterology

## 2020-01-28 ENCOUNTER — Ambulatory Visit (HOSPITAL_COMMUNITY)
Admission: RE | Admit: 2020-01-28 | Discharge: 2020-01-28 | Disposition: A | Discharge status: Home / Self Care | Payer: 59 | Attending: Gastroenterology | Admitting: Gastroenterology

## 2020-01-28 ENCOUNTER — Ambulatory Visit (HOSPITAL_COMMUNITY): Payer: 59 | Admitting: Anesthesiology

## 2020-01-28 DIAGNOSIS — Z7984 Long term (current) use of oral hypoglycemic drugs: Secondary | ICD-10-CM | POA: Insufficient documentation

## 2020-01-28 DIAGNOSIS — Z8674 Personal history of sudden cardiac arrest: Secondary | ICD-10-CM | POA: Diagnosis not present

## 2020-01-28 DIAGNOSIS — Z6835 Body mass index (BMI) 35.0-35.9, adult: Secondary | ICD-10-CM | POA: Insufficient documentation

## 2020-01-28 DIAGNOSIS — E119 Type 2 diabetes mellitus without complications: Secondary | ICD-10-CM | POA: Diagnosis not present

## 2020-01-28 DIAGNOSIS — I10 Essential (primary) hypertension: Secondary | ICD-10-CM | POA: Diagnosis not present

## 2020-01-28 DIAGNOSIS — K529 Noninfective gastroenteritis and colitis, unspecified: Secondary | ICD-10-CM

## 2020-01-28 DIAGNOSIS — K648 Other hemorrhoids: Secondary | ICD-10-CM | POA: Insufficient documentation

## 2020-01-28 DIAGNOSIS — J45909 Unspecified asthma, uncomplicated: Secondary | ICD-10-CM | POA: Diagnosis not present

## 2020-01-28 DIAGNOSIS — Z79899 Other long term (current) drug therapy: Secondary | ICD-10-CM | POA: Diagnosis not present

## 2020-01-28 DIAGNOSIS — E669 Obesity, unspecified: Secondary | ICD-10-CM | POA: Insufficient documentation

## 2020-01-28 DIAGNOSIS — F17210 Nicotine dependence, cigarettes, uncomplicated: Secondary | ICD-10-CM | POA: Diagnosis not present

## 2020-01-28 HISTORY — PX: COLONOSCOPY WITH PROPOFOL: SHX5780

## 2020-01-28 HISTORY — PX: BIOPSY: SHX5522

## 2020-01-28 LAB — GLUCOSE, CAPILLARY
Glucose-Capillary: 136 mg/dL — ABNORMAL HIGH (ref 70–99)
Glucose-Capillary: 132 mg/dL — ABNORMAL HIGH (ref 70–99)

## 2020-01-28 SURGERY — COLONOSCOPY WITH PROPOFOL
Anesthesia: General

## 2020-01-28 MED ORDER — LACTATED RINGERS IV SOLN: INTRAVENOUS | Status: DC | PRN

## 2020-01-28 MED ORDER — PROPOFOL 10 MG/ML IV BOLUS
INTRAVENOUS | Status: DC | PRN
  Administered 2020-01-28 (×3): 50 mg via INTRAVENOUS
  Administered 2020-01-28: 40 mg via INTRAVENOUS
  Administered 2020-01-28: 100 mg via INTRAVENOUS
  Administered 2020-01-28: 50 mg via INTRAVENOUS

## 2020-01-28 MED ORDER — LACTATED RINGERS IV SOLN: Freq: Once | INTRAVENOUS | Status: AC

## 2020-01-28 NOTE — Anesthesia Preprocedure Evaluation (Signed)
Anesthesia Evaluation  Patient identified by MRN, date of birth, ID band Patient awake    Reviewed: Allergy & Precautions, NPO status , Patient's Chart, lab work & pertinent test results  History of Anesthesia Complications (+) history of anesthetic complications (h/o cardiac arrest/bradycardia during cholecystectomy - 1978)  Airway Mallampati: III  TM Distance: >3 FB Neck ROM: Full    Dental  (+) Teeth Intact, Dental Advisory Given   Pulmonary asthma , Current Smoker and Patient abstained from smoking.,    Pulmonary exam normal breath sounds clear to auscultation       Cardiovascular Exercise Tolerance: Good hypertension, Pt. on medications Normal cardiovascular exam Rhythm:Regular Rate:Normal     Neuro/Psych PSYCHIATRIC DISORDERS Depression    GI/Hepatic negative GI ROS, (+)     substance abuse  alcohol use,   Endo/Other  diabetes, Well Controlled, Type 2, Oral Hypoglycemic Agents  Renal/GU   negative genitourinary   Musculoskeletal  (+) Arthritis  (back pain, back sx),   Abdominal   Peds negative pediatric ROS (+)  Hematology negative hematology ROS (+)   Anesthesia Other Findings h/o cardiac arrest/bradycardia during open cholecystectomy - 1978  Reproductive/Obstetrics negative OB ROS                             Anesthesia Physical Anesthesia Plan  ASA: II  Anesthesia Plan: General   Post-op Pain Management:    Induction: Intravenous  PONV Risk Score and Plan: 2 and TIVA  Airway Management Planned: Nasal Cannula, Natural Airway and Simple Face Mask  Additional Equipment:   Intra-op Plan:   Post-operative Plan:   Informed Consent: I have reviewed the patients History and Physical, chart, labs and discussed the procedure including the risks, benefits and alternatives for the proposed anesthesia with the patient or authorized representative who has indicated his/her  understanding and acceptance.     Dental advisory given  Plan Discussed with: CRNA and Surgeon  Anesthesia Plan Comments:         Anesthesia Quick Evaluation

## 2020-01-28 NOTE — Anesthesia Postprocedure Evaluation (Signed)
Anesthesia Post Note  Patient: Nancy Thornton  Procedure(s) Performed: COLONOSCOPY WITH PROPOFOL (N/A ) BIOPSY  Patient location during evaluation: Endoscopy Anesthesia Type: General Level of consciousness: awake, oriented, awake and alert and patient cooperative Pain management: pain level controlled Vital Signs Assessment: post-procedure vital signs reviewed and stable Respiratory status: spontaneous breathing, respiratory function stable, nonlabored ventilation and patient connected to nasal cannula oxygen Cardiovascular status: blood pressure returned to baseline and stable Postop Assessment: no headache and no backache Anesthetic complications: no   No complications documented.   Last Vitals:  Vitals:   01/28/20 0758  BP: (!) 109/92  Pulse: 57  Resp: 19  Temp: 37 C  SpO2: 97%    Last Pain:  Vitals:   01/28/20 0957  TempSrc:   PainSc: 0-No pain                 Brynda Peon

## 2020-01-28 NOTE — H&P (Signed)
Nancy Thornton is an 59 y.o. female.   Chief Complaint: chronic diarrhea HPI: Nancy Thornton is a 59 y.o. female with past medical history of asthma, previous depression, diabetes, hypertension, obesity, fatty liver who comes to the hospital to undergo a colonoscopy for evaluation of  chronic diarrhea.    Patient hs presented intermittent episodes of diarrhea 4-5 BMs watery in nature per day since 06/2018. Has not tried OTC medications but has been taking iburpofen cihronically, as wella s metformin. She has not tried OTC medications for diarrhea.  Patient had colonoscopy and endoscopy performed in 2014.  Colonoscopy showed normal colon, recommended to repeat in 10 years.  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  Small hiatal hernia was present.  Past Medical History:  Diagnosis Date  . Anal fissure    history of  . Asthma   . Depression   . Diabetes mellitus without complication (HCC)   . DM (diabetes mellitus) (HCC) 10/19/2012  . Hypertension   . Lichen    hands and feet  . Obesity   . 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 ANTERIOR LUMBAR FUSION N/A 03/06/2019   Procedure: L4-5 XLIF, L4-5 PSF, LEFT SYNOVIAL CYST RESECTION L4-5;  Surgeon: Venetia Night, MD;  Location: [facility] ORS;  Service: Neurosurgery;  Laterality: N/A;  . BACK SURGERY    . CHOLECYSTECTOMY    . CYSTOSTOMY W/ BLADDER BIOPSY  1990   punctured hole in bladder, had open procedure to repair  . MENISCUS REPAIR    . TONSILLECTOMY    . TOTAL VAGINAL HYSTERECTOMY     59 y/o. bleeding. ovaries left in situ    Family History  Problem Relation Age of Onset  . Diabetes Mother   . Hypertension Father   . Colon cancer Neg Hx   . Esophageal cancer Neg Hx   . Rectal cancer Neg  Hx   . Stomach cancer Neg Hx    Social History:  reports that she has been smoking cigarettes. She started smoking about 42 years ago. She has been smoking about 0.50 packs per day. She has never used smokeless tobacco. She reports that she does not drink alcohol and does not use drugs.  Allergies:  Allergies  Allergen Reactions  . Penicillins Hives    Did it involve swelling of the face/tongue/throat, SOB, or low BP? No Did it involve sudden or severe rash/hives, skin peeling, or any reaction on the inside of your mouth or nose? Yes Did you need to seek medical attention at a hospital or doctor's office? No When did it last happen?Occurred at age 66 If all above answers are "NO", may proceed with cephalosporin use.   . Meloxicam Hypertension  . Bactrim [Sulfamethoxazole-Trimethoprim] Rash    Medications Prior to Admission  Medication Sig Dispense Refill  . albuterol (PROAIR HFA) 108 (90 Base) MCG/ACT inhaler Inhale 2 puffs into the lungs every 6 (six) hours as needed for wheezing or shortness of breath. 3.7 g 6  . amLODipine (NORVASC) 2.5 MG tablet Take 1 tablet by mouth once daily (Patient taking differently: Take 2.5 mg by mouth daily. ) 90 tablet 3  . atenolol (TENORMIN) 50 MG tablet Take 1 tablet by mouth once daily 90 tablet 0  .  atorvastatin (LIPITOR) 10 MG tablet Take 1 tablet by mouth once daily (Patient taking differently: Take 10 mg by mouth daily. ) 90 tablet 3  . gabapentin (NEURONTIN) 300 MG capsule Take 1 capsule (300 mg total) by mouth 3 (three) times daily. (Patient taking differently: Take 300 mg by mouth See admin instructions. Take 300 mg at bedtime, may take a 300 mg dose during the day as needed for pain) 90 capsule 3  . ibuprofen (ADVIL) 200 MG tablet Take 400 mg by mouth at bedtime.     Marland Kitchen lisinopril-hydrochlorothiazide (ZESTORETIC) 20-25 MG tablet Take 1 tablet by mouth daily. 90 tablet 3  . Melatonin 5 MG TABS Take 5 mg by mouth at bedtime.    . metFORMIN  (GLUCOPHAGE) 1000 MG tablet TAKE 1 TABLET BY MOUTH TWICE DAILY WITH A MEAL (Patient taking differently: Take 1,000 mg by mouth 2 (two) times daily with a meal. ) 180 tablet 3  . Omega-3 Fatty Acids (FISH OIL) 1000 MG CAPS Take 1,000 mg by mouth daily.     Marland Kitchen tiZANidine (ZANAFLEX) 4 MG tablet Take 0.5-1 tablets (2-4 mg total) by mouth every 8 (eight) hours as needed for muscle spasms. (Patient taking differently: Take 4 mg by mouth at bedtime. ) 90 tablet 1    No results found for this or any previous visit (from the past 48 hour(s)). No results found.  Review of Systems  Constitutional: Positive for appetite change.  HENT: Negative.   Eyes: Negative.   Respiratory: Negative.   Cardiovascular: Negative.   Gastrointestinal: Positive for abdominal distention and diarrhea.  Endocrine: Negative.   Genitourinary: Negative.   Musculoskeletal: Negative.   Skin: Negative.   Allergic/Immunologic: Negative.   Neurological: Negative.   Hematological: Negative.   Psychiatric/Behavioral: Negative.     Blood pressure (!) 109/92, pulse 57, temperature 98.6 F (37 C), temperature source Oral, resp. rate 19, height 5' 5.5" (1.664 m), SpO2 97 %. Physical Exam  GENERAL: The patient is AO x3, in no acute distress. Obese.  HEENT: Head is normocephalic and atraumatic. EOMI are intact. Mouth is well hydrated and without lesions. NECK: Supple. No masses LUNGS: has mild wheezing bilaterally. Adequate chest expansion HEART: RRR, normal s1 and s2. ABDOMEN: Soft, nontender, no guarding, no peritoneal signs, and nondistended. BS +. No masses. EXTREMITIES: Without any cyanosis, clubbing, rash, lesions or edema. NEUROLOGIC: AOx3, no focal motor deficit. SKIN: no jaundice, no rashes  Assessment/Plan Nancy Thornton is a 59 y.o. female with past medical history of asthma, previous depression, diabetes, hypertension, obesity, fatty liver who comes to the hospital to undergo a colonoscopy for evaluation of  chronic  diarrhea.  The patient will need to undergo random colon biopsies to rule out microscopic colitis.   Dolores Frame, MD 01/28/2020, 9:56 AM

## 2020-01-28 NOTE — Op Note (Addendum)
[facility] Patient Name: Nancy Thornton Procedure Date: 01/28/2020 9:39 AM MRN: [id number] Date of Birth: 09-19-1960 Attending MD: Katrinka Blazing ,  CSN: [id number] Age: 59 Admit Type: Outpatient Procedure:                Colonoscopy Indications:              Chronic diarrhea Providers:                Katrinka Blazing, Criselda Peaches. Patsy Lager, RN, Dyann Ruddle Referring MD:             Rozell Searing. Gottschalk Medicines:                Monitored Anesthesia Care Complications:            No immediate complications. Estimated Blood Loss:      Procedure:                Pre-Anesthesia Assessment:                           - Prior to the procedure, a History and Physical                            was performed, and patient medications, allergies                            and sensitivities were reviewed. The patient's                            tolerance of previous anesthesia was reviewed.                           - The risks and benefits of the procedure and the                            sedation options and risks were discussed with the                            patient. All questions were answered and informed                            consent was obtained.                           - ASA Grade Assessment: II - A patient with mild                            systemic disease.                           After obtaining informed consent, the colonoscope                            was passed under direct vision. Throughout the  procedure, the patient's blood pressure, pulse, and                            oxygen saturations were monitored continuously. The                            [id number]-[id number] ([id number]) scope was introduced through                            the anus and advanced to the the terminal ileum.                            The colonoscopy was performed without difficulty.                            The patient tolerated the  procedure well. The                            quality of the bowel preparation was adequate.                            Scope withdrawal time was 12 minutes. Scope In: 10:05:51 AM Scope Out: 10:24:51 AM Scope Withdrawal Time: 0 hours 13 minutes 40 seconds  Total Procedure Duration: 0 hours 19 minutes 0 seconds  Findings:      The perianal and digital rectal examinations were normal.      The terminal ileum appeared normal.      The colon (entire examined portion) appeared normal. Random right and       left colon biopsies for histology were taken with a cold forceps for       evaluation of microscopic colitis.      Non-bleeding internal hemorrhoids were found during retroflexion. The       hemorrhoids were small. Impression:               - The examined portion of the ileum was normal.                           - The entire examined colon is normal. Biopsied.                           - Non-bleeding internal hemorrhoids. Moderate Sedation:      Per Anesthesia Care Recommendation:           - Discharge patient to home (ambulatory).                           - Resume previous diet.                           - Await pathology results.                           - Implement low FODMAP diet.                           - Avoid sweeteners                           -  Avoid ibuprofen                           - Repeat colonoscopy in 10 years for screening                            purposes. Procedure Code(s):        --- Professional ---                           [phone], GC, Colonoscopy, flexible; with biopsy,                            single or multiple Diagnosis Code(s):        --- Professional ---                           K64.8, Other hemorrhoids                           K52.9, Noninfective gastroenteritis and colitis,                            unspecified CPT copyright 2019 American Medical Association. All rights reserved. The codes documented in this report are preliminary and upon  coder review may  be revised to meet current compliance requirements. Katrinka Blazing, MD Katrinka Blazing,  01/28/2020 10:36:13 AM This report has been signed electronically. Number of Addenda: 0

## 2020-01-28 NOTE — Transfer of Care (Signed)
Immediate Anesthesia Transfer of Care Note  Patient: Nancy Thornton  Procedure(s) Performed: COLONOSCOPY WITH PROPOFOL (N/A ) BIOPSY  Patient Location: PACU and Endoscopy Unit  Anesthesia Type:General  Level of Consciousness: awake, alert , oriented and patient cooperative  Airway & Oxygen Therapy: Patient Spontanous Breathing and Patient connected to nasal cannula oxygen  Post-op Assessment: Report given to RN, Post -op Vital signs reviewed and stable and Patient moving all extremities  Post vital signs: Reviewed and stable  Last Vitals:  Vitals Value Taken Time  BP    Temp    Pulse    Resp    SpO2      Last Pain:  Vitals:   01/28/20 0957  TempSrc:   PainSc: 0-No pain      Patients Stated Pain Goal: 3 (01/28/20 0758)  Complications: No complications documented.

## 2020-01-28 NOTE — Discharge Instructions (Signed)
Colonoscopy, Adult, Care After This sheet gives you information about how to care for yourself after your procedure. Your doctor may also give you more specific instructions. If you have problems or questions, call your doctor. What can I expect after the procedure? After the procedure, it is common to have:  A small amount of blood in your poop (stool) for 24 hours.  Some gas.  Mild cramping or bloating in your belly (abdomen). Follow these instructions at home: Eating and drinking   Drink enough fluid to keep your pee (urine) pale yellow.  Follow instructions from your doctor about what you cannot eat or drink.  Return to your normal diet as told by your doctor. Avoid heavy or fried foods that are hard to digest. Activity  Rest as told by your doctor.  Do not sit for a long time without moving. Get up to take short walks every 1-2 hours. This is important. Ask for help if you feel weak or unsteady.  Return to your normal activities as told by your doctor. Ask your doctor what activities are safe for you. To help cramping and bloating:   Try walking around.  Put heat on your belly as told by your doctor. Use the heat source that your doctor recommends, such as a moist heat pack or a heating pad. ? Put a towel between your skin and the heat source. ? Leave the heat on for 20-30 minutes. ? Remove the heat if your skin turns bright red. This is very important if you are unable to feel pain, heat, or cold. You may have a greater risk of getting burned. General instructions  For the first 24 hours after the procedure: ? Do not drive or use machinery. ? Do not sign important documents. ? Do not drink alcohol. ? Do your daily activities more slowly than normal. ? Eat foods that are soft and easy to digest.  Take over-the-counter or prescription medicines only as told by your doctor.  Keep all follow-up visits as told by your doctor. This is important. Contact a doctor  if:  You have blood in your poop 2-3 days after the procedure. Get help right away if:  You have more than a small amount of blood in your poop.  You see large clumps of tissue (blood clots) in your poop.  Your belly is swollen.  You feel like you may vomit (nauseous).  You vomit.  You have a fever.  You have belly pain that gets worse, and medicine does not help your pain. Summary  After the procedure, it is common to have a small amount of blood in your poop. You may also have mild cramping and bloating in your belly.  For the first 24 hours after the procedure, do not drive or use machinery, do not sign important documents, and do not drink alcohol.  Get help right away if you have a lot of blood in your poop, feel like you may vomit, have a fever, or have more belly pain. This information is not intended to replace advice given to you by your health care provider. Make sure you discuss any questions you have with your health care provider. Document Revised: 01/14/2019 Document Reviewed: 01/14/2019 Elsevier Patient Education  2020 [employer]   Follow up pathology report. Repeat screening colonoscopy in 10 years Start a low FODMAP diet. Avoid sweeteners Avoid ibuprofen

## 2020-01-29 ENCOUNTER — Telehealth (INDEPENDENT_AMBULATORY_CARE_PROVIDER_SITE_OTHER): Payer: Self-pay | Admitting: Gastroenterology

## 2020-01-29 LAB — SURGICAL PATHOLOGY

## 2020-01-29 NOTE — Telephone Encounter (Signed)
Called patient, did not pick up the phone. Left voice message thoroughly describing the absence of alterations consistent with microscopic colitis in her random colonic biopsies.  The patient was advised to implement strict low FODMAP diet, avoid ibuprofen and to discuss with her primary care physician to change her antidiabetic medication discussed in the past.  She can also take Imodium as needed.  We will need to follow in the GI clinic.  Katrinka Blazing, MD Gastroenterology and Hepatology [facility]

## 2020-01-31 ENCOUNTER — Encounter (HOSPITAL_COMMUNITY): Payer: Self-pay | Admitting: Gastroenterology

## 2020-02-04 ENCOUNTER — Ambulatory Visit (INDEPENDENT_AMBULATORY_CARE_PROVIDER_SITE_OTHER): Payer: 59 | Admitting: Family Medicine

## 2020-02-04 ENCOUNTER — Other Ambulatory Visit: Payer: Self-pay

## 2020-02-04 ENCOUNTER — Encounter: Payer: Self-pay | Admitting: Family Medicine

## 2020-02-04 VITALS — BP 106/72 | HR 77 | Temp 97.4°F | Ht 65.5 in | Wt 214.6 lb

## 2020-02-04 DIAGNOSIS — Z0001 Encounter for general adult medical examination with abnormal findings: Secondary | ICD-10-CM | POA: Diagnosis not present

## 2020-02-04 DIAGNOSIS — E1159 Type 2 diabetes mellitus with other circulatory complications: Secondary | ICD-10-CM

## 2020-02-04 DIAGNOSIS — Z0000 Encounter for general adult medical examination without abnormal findings: Secondary | ICD-10-CM

## 2020-02-04 DIAGNOSIS — E1169 Type 2 diabetes mellitus with other specified complication: Secondary | ICD-10-CM

## 2020-02-04 DIAGNOSIS — F418 Other specified anxiety disorders: Secondary | ICD-10-CM

## 2020-02-04 DIAGNOSIS — E1165 Type 2 diabetes mellitus with hyperglycemia: Secondary | ICD-10-CM | POA: Diagnosis not present

## 2020-02-04 DIAGNOSIS — I10 Essential (primary) hypertension: Secondary | ICD-10-CM

## 2020-02-04 DIAGNOSIS — I152 Hypertension secondary to endocrine disorders: Secondary | ICD-10-CM

## 2020-02-04 DIAGNOSIS — E785 Hyperlipidemia, unspecified: Secondary | ICD-10-CM

## 2020-02-04 DIAGNOSIS — E559 Vitamin D deficiency, unspecified: Secondary | ICD-10-CM

## 2020-02-04 LAB — BAYER DCA HB A1C WAIVED: HB A1C (BAYER DCA - WAIVED): 7 % — ABNORMAL HIGH (ref <7.0)

## 2020-02-04 MED ORDER — LORAZEPAM 0.5 MG PO TABS: ORAL_TABLET | ORAL | 0 refills | Status: DC

## 2020-02-04 NOTE — Progress Notes (Signed)
Nancy Thornton is a 59 y.o. female presents to office today for annual physical exam examination.    Concerns today include: 1. Type 2 Diabetes w/ HTN, HLD:  Patient reports some intermittent dizziness.  It does not seem to be associated with position necessarily.  She is compliant with Norvasc 2.5 mg daily, atenolol 50 mg daily, Zestoretic 20-25 mg daily and Metformin 1000 mg twice daily.  She is also on atorvastatin 10 mg daily.  Otherwise denies chest pain, shortness of breath, extremity edema.  Last eye exam: UTD Last foot exam: needs Last A1c:  Lab Results  Component Value Date   HGBA1C 6.5 10/03/2019   Nephropathy screen indicated?: UTD Last flu, zoster and/or pneumovax:  Immunization History  Administered Date(s) Administered  . Influenza, High Dose Seasonal PF 03/30/2018  . Influenza, Quadrivalent, Recombinant, Inj, Pf 03/20/2019  . Influenza, Seasonal, Injecte, Preservative Fre 04/18/2013, 04/12/2014, 03/12/2015, 04/08/2017  . Influenza,inj,Quad PF,6+ Mos 04/18/2013, 03/12/2015, 04/08/2017, 03/30/2018  . Influenza-Unspecified 04/12/2014, 03/20/2019  . Moderna SARS-COVID-2 Vaccination 09/20/2019, 10/22/2019  . Pneumococcal Conjugate-13 09/05/2013  . Pneumococcal Polysaccharide-23 03/12/2015  . Td 01/09/2017  . Tdap 01/09/2017   2. Flight anxiety Patient reports that she will be [place] to [place] soon.  She does have slight anxiety and wants to know if there is something I can give her to calm her down so that she does not have a panic attack on the plane.  She tolerated the benzodiazepine that I prescribed her for her procedure last July.  Substance use: Tobacco, contemplative Diet: 2 meals per day, Exercise: not as physically active as preCOVID Last eye exam: needs Last colonoscopy: UTD Last mammogram: UTD Last pap smear: n/a  Past Medical History:  Diagnosis Date  . Anal fissure    history of  . Asthma   . Depression   . Diabetes mellitus without  complication (HCC)   . DM (diabetes mellitus) (HCC) 10/19/2012  . Hypertension   . Lichen    hands and feet  . Obesity   . 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 Marital status: Divorced    Spouse name: Not on file  . Number of children: 1  . Years of education: Not on file  . Highest education level: Not on file  Occupational History  . 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  Tobacco Use  . Smoking status: Light Tobacco Smoker    Packs/day: 0.50    Types: Cigarettes    Start date: 10/18/1977    Last attempt to quit: 02/10/2019    Years since quitting: 0.9  . Smokeless tobacco: Never Used  Vaping Use  . Vaping Use: Never used  Substance and Sexual Activity  . Alcohol use: No  . Drug use: No  . Sexual activity: Not Currently  Other Topics Concern  . Not on file  Social History Narrative  . Not on file   Social Determinants of Health   Financial Resource Strain:   . Difficulty of Paying Living Expenses:   Food Insecurity:   . Worried About Programme researcher, broadcasting/film/video in the Last Year:   . Barista in the Last Year:   Transportation Needs:   . Freight forwarder (Medical):   Marland Kitchen Lack of Transportation (Non-Medical):   Physical Activity:   . Days of Exercise per Week:   . Minutes of Exercise per Session:  Stress:   . Feeling of Stress :   Social Connections:   . Frequency of Communication with Friends and Family:   . Frequency of Social Gatherings with Friends and Family:   . Attends Religious Services:   . Active Member of Clubs or Organizations:   . Attends BankerClub or Organization Meetings:   Marland Kitchen. Marital Status:   Intimate Partner Violence:   . Fear of Current or Ex-Partner:   . Emotionally Abused:   Marland Kitchen. Physically Abused:   . Sexually Abused:    Past Surgical History:  Procedure Laterality Date  . ANTERIOR LUMBAR FUSION N/A 03/06/2019   Procedure: L4-5 XLIF, L4-5 PSF, LEFT  SYNOVIAL CYST RESECTION L4-5;  Surgeon: Venetia NightYarbrough, Chester, MD;  Location: [facility];  Service: Neurosurgery;  Laterality: N/A;  . BACK SURGERY    . BIOPSY  01/28/2020   Procedure: BIOPSY;  Surgeon: Dolores Frameastaneda Mayorga, Daniel, MD;  Location: [facility];  Service: Gastroenterology;;  . CHOLECYSTECTOMY    . COLONOSCOPY WITH PROPOFOL N/A 01/28/2020   Procedure: COLONOSCOPY WITH PROPOFOL;  Surgeon: Dolores Frameastaneda Mayorga, Daniel, MD;  Location: [facility];  Service: Gastroenterology;  Laterality: N/A;  915  . CYSTOSTOMY W/ BLADDER BIOPSY  1990   punctured hole in bladder, had open procedure to repair  . MENISCUS REPAIR    . TONSILLECTOMY    . TOTAL VAGINAL HYSTERECTOMY     59 y/o. bleeding. ovaries left in situ   Family History  Problem Relation Age of Onset  . Diabetes Mother   . Hypertension Father   . Colon cancer Neg Hx   . Esophageal cancer Neg Hx   . Rectal cancer Neg Hx   . Stomach cancer Neg Hx     Current Outpatient Medications:  .  albuterol (PROAIR HFA) 108 (90 Base) MCG/ACT inhaler, Inhale 2 puffs into the lungs every 6 (six) hours as needed for wheezing or shortness of breath., Disp: 3.7 g, Rfl: 6 .  amLODipine (NORVASC) 2.5 MG tablet, Take 1 tablet by mouth once daily (Patient taking differently: Take 2.5 mg by mouth daily. ), Disp: 90 tablet, Rfl: 3 .  atenolol (TENORMIN) 50 MG tablet, Take 1 tablet by mouth once daily, Disp: 90 tablet, Rfl: 0 .  atorvastatin (LIPITOR) 10 MG tablet, Take 1 tablet by mouth once daily (Patient taking differently: Take 10 mg by mouth daily. ), Disp: 90 tablet, Rfl: 3 .  gabapentin (NEURONTIN) 300 MG capsule, Take 1 capsule (300 mg total) by mouth 3 (three) times daily. (Patient taking differently: Take 300 mg by mouth See admin instructions. Take 300 mg at bedtime, may take a 300 mg dose during the day as needed for pain), Disp: 90 capsule, Rfl: 3 .  lisinopril-hydrochlorothiazide (ZESTORETIC) 20-25 MG tablet, Take 1 tablet by mouth daily., Disp:  90 tablet, Rfl: 3 .  Melatonin 5 MG TABS, Take 5 mg by mouth at bedtime., Disp: , Rfl:  .  metFORMIN (GLUCOPHAGE) 1000 MG tablet, TAKE 1 TABLET BY MOUTH TWICE DAILY WITH A MEAL (Patient taking differently: Take 1,000 mg by mouth 2 (two) times daily with a meal. ), Disp: 180 tablet, Rfl: 3 .  Omega-3 Fatty Acids (FISH OIL) 1000 MG CAPS, Take 1,000 mg by mouth daily. , Disp: , Rfl:  .  tiZANidine (ZANAFLEX) 4 MG tablet, Take 0.5-1 tablets (2-4 mg total) by mouth every 8 (eight) hours as needed for muscle spasms. (Patient taking differently: Take 4 mg by mouth at bedtime. ), Disp: 90 tablet, Rfl: 1  Allergies  Allergen Reactions  . Penicillins Hives    Did it involve swelling of the face/tongue/throat, SOB, or low BP? No Did it involve sudden or severe rash/hives, skin peeling, or any reaction on the inside of your mouth or nose? Yes Did you need to seek medical attention at a hospital or doctor's office? No When did it last happen?Occurred at age 48 If all above answers are "NO", may proceed with cephalosporin use.   . Meloxicam Hypertension  . Bactrim [Sulfamethoxazole-Trimethoprim] Rash     ROS: Review of Systems Pertinent items noted in HPI and remainder of comprehensive ROS otherwise negative.    Physical exam BP 106/72   Pulse 77   Temp (!) 97.4 F (36.3 C)   Ht 5' 5.5" (1.664 m)   Wt 214 lb 9.6 oz (97.3 kg)   SpO2 95%   BMI 35.17 kg/m  General appearance: alert, cooperative, appears stated age, no distress and mildly obese Head: Normocephalic, without obvious abnormality, atraumatic Eyes: negative findings: lids and lashes normal, conjunctivae and sclerae normal, corneas clear and pupils equal, round, reactive to light and accomodation Ears: TMs are obscured by cerumen Nose: Nares normal. Septum midline. Mucosa normal. No drainage or sinus tenderness. Throat: lips, mucosa, and tongue normal; teeth and gums normal Neck: no adenopathy, supple, symmetrical, trachea midline  and thyroid not enlarged, symmetric, no tenderness/mass/nodules Back: symmetric, no curvature. ROM normal. No CVA tenderness. Lungs: clear to auscultation bilaterally Heart: regular rate and rhythm, S1, S2 normal, no murmur, click, rub or gallop Abdomen: soft, non-tender; bowel sounds normal; no masses,  no organomegaly Extremities: extremities normal, atraumatic, no cyanosis or edema Pulses: 2+ and symmetric Skin: Few pigmented nevi noted on her back. Lymph nodes: Cervical, supraclavicular, and axillary nodes normal. Neurologic: Alert and oriented X 3, normal strength and tone. Normal symmetric reflexes. Normal coordination and gait Psych: Mood stable, speech normal, affect appropriate, pleasant and interactive  Depression screen [facility] 2/9 02/04/2020 11/28/2019 10/03/2019  Decreased Interest 0 0 0  Down, Depressed, Hopeless 0 0 0  PHQ - 2 Score 0 0 0  Altered sleeping - - 0  Tired, decreased energy - - 0  Change in appetite - - 0  Feeling bad or failure about yourself  - - 0  Trouble concentrating - - 0  Moving slowly or fidgety/restless - - 0  Suicidal thoughts - - 0  PHQ-9 Score - - 0  Difficult doing work/chores - - -  Some recent data might be hidden   Assessment/ Plan: Nancy Thornton here for annual physical exam.   1. Annual physical exam Counseled on tobacco use.  She is contemplative  2. Type 2 diabetes mellitus with hyperglycemia, without long-term current use of insulin (HCC) A1c was 7.0.  She is borderline.  Follow-up in 3 to 6 months - Bayer DCA Hb A1c Waived  3. Hyperlipidemia associated with type 2 diabetes mellitus (HCC) Continue statin  4. Hypertension associated with diabetes (HCC) Discontinue amlodipine given borderline hypotension and intermittent episodes of dizziness.  I would like to see her back in a couple of weeks to recheck blood pressure.  May need to consider having her lisinopril hydrochlorothiazide if blood pressure persistently borderline  5. Morbid  obesity (HCC) Working on weight loss  6. Vitamin D deficiency - VITAMIN D 25 Hydroxy (Vit-D Deficiency, Fractures)  7. Situational anxiety National narcotic database was reviewed and there were no red flags.  I think that anxiety is an appropriate use of the benzodiazepine.  Okay to take 1 to 2 tablets prior to flight.  Caution sedation.  Avoid alcohol. - LORazepam (ATIVAN) 0.5 MG tablet; Take 1 tablet 1 hour before trip.  May repeat dose 1 time if needed.  Dispense: 10 tablet; Refill: 0   Counseled on healthy lifestyle choices, including diet (rich in fruits, vegetables and lean meats and low in salt and simple carbohydrates) and exercise (at least 30 minutes of moderate physical activity daily).  Patient to follow up in 1 year for annual exam or sooner if needed.  Nancy Thornton M. Nadine Counts, DO

## 2020-02-04 NOTE — Patient Instructions (Signed)
You had labs performed today.  You will be contacted with the results of the labs once they are available, usually in the next 3 business days for routine lab work.  If you have an active my chart account, they will be released to your MyChart.  If you prefer to have these labs released to you via telephone, please let us know.  If you had a pap smear or biopsy performed, expect to be contacted in about 7-10 days.  Have a safe trip!  Preventive Care 56-4 Years Old, Female Preventive care refers to visits with your health care provider and lifestyle choices that can promote health and wellness. This includes:  A yearly physical exam. This may also be called an annual well check.  Regular dental visits and eye exams.  Immunizations.  Screening for certain conditions.  Healthy lifestyle choices, such as eating a healthy diet, getting regular exercise, not using drugs or products that contain nicotine and tobacco, and limiting alcohol use. What can I expect for my preventive care visit? Physical exam Your health care provider will check your:  Height and weight. This may be used to calculate body mass index (BMI), which tells if you are at a healthy weight.  Heart rate and blood pressure.  Skin for abnormal spots. Counseling Your health care provider may ask you questions about your:  Alcohol, tobacco, and drug use.  Emotional well-being.  Home and relationship well-being.  Sexual activity.  Eating habits.  Work and work Astronomer.  Method of birth control.  Menstrual cycle.  Pregnancy history. What immunizations do I need?  Influenza (flu) vaccine  This is recommended every year. Tetanus, diphtheria, and pertussis (Tdap) vaccine  You may need a Td booster every 10 years. Varicella (chickenpox) vaccine  You may need this if you have not been vaccinated. Zoster (shingles) vaccine  You may need this after age 34. Measles, mumps, and rubella (MMR)  vaccine  You may need at least one dose of MMR if you were born in 1957 or later. You may also need a second dose. Pneumococcal conjugate (PCV13) vaccine  You may need this if you have certain conditions and were not previously vaccinated. Pneumococcal polysaccharide (PPSV23) vaccine  You may need one or two doses if you smoke cigarettes or if you have certain conditions. Meningococcal conjugate (MenACWY) vaccine  You may need this if you have certain conditions. Hepatitis A vaccine  You may need this if you have certain conditions or if you travel or work in places where you may be exposed to hepatitis A. Hepatitis B vaccine  You may need this if you have certain conditions or if you travel or work in places where you may be exposed to hepatitis B. Haemophilus influenzae type b (Hib) vaccine  You may need this if you have certain conditions. Human papillomavirus (HPV) vaccine  If recommended by your health care provider, you may need three doses over 6 months. You may receive vaccines as individual doses or as more than one vaccine together in one shot (combination vaccines). Talk with your health care provider about the risks and benefits of combination vaccines. What tests do I need? Blood tests  Lipid and cholesterol levels. These may be checked every 5 years, or more frequently if you are over 66 years old.  Hepatitis C test.  Hepatitis B test. Screening  Lung cancer screening. You may have this screening every year starting at age 19 if you have a 30-pack-year history of smoking  and currently smoke or have quit within the past 15 years.  Colorectal cancer screening. All adults should have this screening starting at age 59 and continuing until age 38. Your health care provider may recommend screening at age 34 if you are at increased risk. You will have tests every 1-10 years, depending on your results and the type of screening test.  Diabetes screening. This is done by  checking your blood sugar (glucose) after you have not eaten for a while (fasting). You may have this done every 1-3 years.  Mammogram. This may be done every 1-2 years. Talk with your health care provider about when you should start having regular mammograms. This may depend on whether you have a family history of breast cancer.  BRCA-related cancer screening. This may be done if you have a family history of breast, ovarian, tubal, or peritoneal cancers.  Pelvic exam and Pap test. This may be done every 3 years starting at age 58. Starting at age 66, this may be done every 5 years if you have a Pap test in combination with an HPV test. Other tests  Sexually transmitted disease (STD) testing.  Bone density scan. This is done to screen for osteoporosis. You may have this scan if you are at high risk for osteoporosis. Follow these instructions at home: Eating and drinking  Eat a diet that includes fresh fruits and vegetables, whole grains, lean protein, and low-fat dairy.  Take vitamin and mineral supplements as recommended by your health care provider.  Do not drink alcohol if: ? Your health care provider tells you not to drink. ? You are pregnant, may be pregnant, or are planning to become pregnant.  If you drink alcohol: ? Limit how much you have to 0-1 drink a day. ? Be aware of how much alcohol is in your drink. In the [place] one drink equals one 12 oz bottle of beer (355 mL), one 5 oz glass of wine (148 mL), or one 1 oz glass of hard liquor (44 mL). Lifestyle  Take daily care of your teeth and gums.  Stay active. Exercise for at least 30 minutes on 5 or more days each week.  Do not use any products that contain nicotine or tobacco, such as cigarettes, e-cigarettes, and chewing tobacco. If you need help quitting, ask your health care provider.  If you are sexually active, practice safe sex. Use a condom or other form of birth control (contraception) in order to prevent pregnancy  and STIs (sexually transmitted infections).  If told by your health care provider, take low-dose aspirin daily starting at age 38. What's next?  Visit your health care provider once a year for a well check visit.  Ask your health care provider how often you should have your eyes and teeth checked.  Stay up to date on all vaccines. This information is not intended to replace advice given to you by your health care provider. Make sure you discuss any questions you have with your health care provider. Document Revised: 03/01/2018 Document Reviewed: 03/01/2018 Elsevier Patient Education  2020 [employer].

## 2020-02-05 LAB — VITAMIN D 25 HYDROXY (VIT D DEFICIENCY, FRACTURES): Vit D, 25-Hydroxy: 33.2 ng/mL (ref 30.0–100.0)

## 2020-02-24 ENCOUNTER — Other Ambulatory Visit: Payer: Self-pay | Admitting: Family Medicine

## 2020-02-24 DIAGNOSIS — M549 Dorsalgia, unspecified: Secondary | ICD-10-CM

## 2020-02-24 DIAGNOSIS — Z9889 Other specified postprocedural states: Secondary | ICD-10-CM

## 2020-03-20 ENCOUNTER — Other Ambulatory Visit: Payer: Self-pay | Admitting: Family Medicine

## 2020-03-20 DIAGNOSIS — Z1231 Encounter for screening mammogram for malignant neoplasm of breast: Secondary | ICD-10-CM

## 2020-04-05 ENCOUNTER — Other Ambulatory Visit: Payer: Self-pay | Admitting: Family Medicine

## 2020-04-22 ENCOUNTER — Other Ambulatory Visit: Payer: Self-pay | Admitting: Family Medicine

## 2020-04-22 DIAGNOSIS — I152 Hypertension secondary to endocrine disorders: Secondary | ICD-10-CM

## 2020-04-22 DIAGNOSIS — E1159 Type 2 diabetes mellitus with other circulatory complications: Secondary | ICD-10-CM

## 2020-05-01 ENCOUNTER — Ambulatory Visit
Admission: RE | Admit: 2020-05-01 | Discharge: 2020-05-01 | Disposition: A | Discharge status: Home / Self Care | Payer: 59 | Source: Ambulatory Visit | Attending: Family Medicine | Admitting: Family Medicine

## 2020-05-01 ENCOUNTER — Other Ambulatory Visit: Payer: Self-pay

## 2020-05-01 DIAGNOSIS — Z1231 Encounter for screening mammogram for malignant neoplasm of breast: Secondary | ICD-10-CM

## 2020-05-08 ENCOUNTER — Encounter: Payer: Self-pay | Admitting: Orthopedic Surgery

## 2020-05-08 ENCOUNTER — Ambulatory Visit: Payer: 59 | Admitting: Orthopedic Surgery

## 2020-05-08 ENCOUNTER — Other Ambulatory Visit: Payer: Self-pay

## 2020-05-08 ENCOUNTER — Ambulatory Visit: Payer: 59

## 2020-05-08 VITALS — BP 143/85 | HR 65 | Ht 65.5 in | Wt 210.0 lb

## 2020-05-08 DIAGNOSIS — M1712 Unilateral primary osteoarthritis, left knee: Secondary | ICD-10-CM

## 2020-05-08 DIAGNOSIS — G8929 Other chronic pain: Secondary | ICD-10-CM

## 2020-05-08 DIAGNOSIS — M25562 Pain in left knee: Secondary | ICD-10-CM

## 2020-05-08 NOTE — Progress Notes (Signed)
New Patient Visit  Assessment: Nancy Thornton is a 59 y.o. female with the following: Moderate left knee arthritis, primarily medial compartment.  Currently mildly symptomatic.   Plan: Mrs. Sickman is having a number of symptoms in her left leg.  She does have a recent history of lumbar fusion surgery, but continues to have pain radiating from her back down the back of her leg and into the back of her knee.  Radiographs of the left knee demonstrate moderate degenerative changes with a few small osteophytes, primarily within the medial compartment.  On physical exam, she does have some mild tenderness to palpation along the medial joint line, as well as some fullness in the posterior aspect of her knee.  Overall, the pain in her left knee is mild at this time.  As a result, I feel as though the majority of her symptoms are coming from her back.  We briefly discussed possible treatments for her back, including image guided injections.  At this time, she is not interested.  I also reminded her, that she could return to see her surgeon at any time, she continues to have problems.  In regards to her left knee, we discussed the findings based on my physical exam and her radiographs, but I recommended against anything at this time.  I have encouraged her to work on her physical therapy exercises, specifically for her back in an attempt to improve her overall symptoms.  All questions were answered and she is in agreement with this plan.  No follow-up is needed at this time.  If the symptoms in her knee worsen, I told her that I'd be happy to see her again and we can discuss further treatment options. Follow-up: Return if symptoms worsen or fail to improve.  Subjective:  Chief Complaint  Patient presents with  . Knee Pain    left/ working on feet a lot has caused increased pain history of surgery 2018 Dr Case   . Leg Pain    has pain behind left knee not sure if from back or knee *lumbar fusion surgery 2020      History of Present Illness: Nil Bolser is a 59 y.o. female who presents for evaluation of left leg pain.  She does have a history of lumbar spinal fusion approximately 1 year ago, she states that her symptoms have not improved following surgery.  The pain starts in her lower back, and radiates down the posterior aspect of her left leg, into the back of her left knee.  She also has a history of a left knee arthroscopy completed in 2018, which included a partial meniscectomy.  She notices some fullness in the posterior aspect of her left knee.  She is taking gabapentin, tizanidine and NSAIDs which does improve some of her pain.  She is unsure if it is her knee, or her back leading to most of her symptoms currently.  Her knee has occasionally been swollen in the past, but is not currently.  She is not been doing any physical therapy recently.  No recent injections in her knee for her back  Review of Systems: No fevers or chills No numbness or tingling No chest pain No shortness of breath No bowel or bladder dysfunction No GI distress No headaches    Medical History:  Past Medical History:  Diagnosis Date  . Anal fissure    history of  . Asthma   . Depression   . Diabetes mellitus without complication (HCC)   .  DM (diabetes mellitus) (HCC) 10/19/2012  . Hypertension   . Lichen    hands and feet  . Obesity   . 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 ANTERIOR LUMBAR FUSION N/A 03/06/2019   Procedure: L4-5 XLIF, L4-5 PSF, LEFT SYNOVIAL CYST RESECTION L4-5;  Surgeon: Venetia Night, MD;  Location: [facility];  Service: Neurosurgery;  Laterality: N/A;  . BACK SURGERY    . BIOPSY  01/28/2020   Procedure: BIOPSY;  Surgeon: Dolores Frame, MD;  Location: [facility];  Service: Gastroenterology;;  . CHOLECYSTECTOMY    . COLONOSCOPY WITH PROPOFOL N/A 01/28/2020   Procedure: COLONOSCOPY WITH  PROPOFOL;  Surgeon: Dolores Frame, MD;  Location: [facility];  Service: Gastroenterology;  Laterality: N/A;  915  . CYSTOSTOMY W/ BLADDER BIOPSY  1990   punctured hole in bladder, had open procedure to repair  . MENISCUS REPAIR    . TONSILLECTOMY    . TOTAL VAGINAL HYSTERECTOMY     59 y/o. bleeding. ovaries left in situ    Family History  Problem Relation Age of Onset  . Diabetes Mother   . Hypertension Father   . Colon cancer Neg Hx   . Esophageal cancer Neg Hx   . Rectal cancer Neg Hx   . Stomach cancer Neg Hx    Social History   Tobacco Use  . Smoking status: Light Tobacco Smoker    Packs/day: 0.50    Types: Cigarettes    Start date: 10/18/1977    Last attempt to quit: 02/10/2019    Years since quitting: 1.2  . Smokeless tobacco: Never Used  Vaping Use  . Vaping Use: Never used  Substance Use Topics  . Alcohol use: No  . Drug use: No    Allergies  Allergen Reactions  . Penicillins Hives    Did it involve swelling of the face/tongue/throat, SOB, or low BP? No Did it involve sudden or severe rash/hives, skin peeling, or any reaction on the inside of your mouth or nose? Yes Did you need to seek medical attention at a hospital or doctor's office? No When did it last happen?Occurred at age 12 If all above answers are "NO", may proceed with cephalosporin use.   . Meloxicam Hypertension  . Bactrim [Sulfamethoxazole-Trimethoprim] Rash    Current Meds  Medication Sig  . albuterol (PROAIR HFA) 108 (90 Base) MCG/ACT inhaler Inhale 2 puffs into the lungs every 6 (six) hours as needed for wheezing or shortness of breath.  Marland Kitchen atenolol (TENORMIN) 50 MG tablet Take 1 tablet by mouth once daily  . atorvastatin (LIPITOR) 10 MG tablet Take 1 tablet by mouth once daily (Patient taking differently: Take 10 mg by mouth daily. )  . gabapentin (NEURONTIN) 300 MG capsule TAKE 1 CAPSULE BY MOUTH THREE TIMES DAILY  . lisinopril-hydrochlorothiazide (ZESTORETIC) 20-25 MG  tablet Take 1 tablet by mouth daily.  . Melatonin 5 MG TABS Take 5 mg by mouth at bedtime.  . Omega-3 Fatty Acids (FISH OIL) 1000 MG CAPS Take 1,000 mg by mouth daily.   Marland Kitchen tiZANidine (ZANAFLEX) 4 MG tablet TAKE 1/2 TO 1 (ONE-HALF TO ONE) TABLET BY MOUTH EVERY 8 HOURS AS NEEDED FOR MUSCLE SPASM    Objective: BP (!) 143/85   Pulse 65   Ht 5' 5.5" (1.664 m)   Wt 210 lb (95.3 kg)   BMI 34.41 kg/m   Physical Exam:  General: Alert and oriented, no acute distress  Gait: Mild, left sided antalgic gait  She has a well-healed lower back surgical incision.  Negative straight leg raise on the left.  Sensation is intact distally on the left side.  Evaluation of left knee demonstrates well-healed arthroscopic incisions.  No surrounding erythema or drainage.  She has full range of motion from 0 to 120 degrees.  Negative Lachman.  Mild tenderness palpation along the medial joint line.  There is a Baker's cyst in the back of her knee.  No increased instability to varus or valgus stress.    IMAGING: I personally ordered and reviewed the following images:  X-rays of the left knee demonstrates mild varus alignment overall.  There is no patellar tilt.  She does have moderate joint space narrowing within the medial compartment with a few small osteophytes.  Maintenance of joint space within the patellofemoral in the lateral compartment.  Impression: Moderate left knee arthritis, primarily within the medial compartment   New Medications:  No orders of the defined types were placed in this encounter.     Oliver Barre, MD  05/08/2020 12:50 PM

## 2020-05-19 ENCOUNTER — Other Ambulatory Visit: Payer: Self-pay | Admitting: Family Medicine

## 2020-05-19 DIAGNOSIS — J4530 Mild persistent asthma, uncomplicated: Secondary | ICD-10-CM

## 2020-05-26 ENCOUNTER — Other Ambulatory Visit: Payer: Self-pay | Admitting: Family Medicine

## 2020-05-26 DIAGNOSIS — F418 Other specified anxiety disorders: Secondary | ICD-10-CM

## 2020-05-26 NOTE — Telephone Encounter (Signed)
She was given #10, this would have been enough for 10 plane rides.  Should not need refills.  Additionally, NTBS if she does since it is controlled.

## 2020-05-26 NOTE — Telephone Encounter (Signed)
Pt called to let Dr Nadine Counts know that the reason she contacted the pharmacy to get a refill on her Lorazepam Rx is because she has to get on an airplane next week.   Says Dr Nadine Counts last prescribed it to her back in August for her anxiety while on airplane.

## 2020-05-26 NOTE — Telephone Encounter (Signed)
Please advise 

## 2020-05-31 ENCOUNTER — Other Ambulatory Visit: Payer: Self-pay | Admitting: Family Medicine

## 2020-05-31 DIAGNOSIS — F418 Other specified anxiety disorders: Secondary | ICD-10-CM

## 2020-06-01 NOTE — Telephone Encounter (Signed)
  Prescription Request  06/01/2020  What is the name of the medication or equipment? Lorazepam   Have you contacted your pharmacy to request a refill? (if applicable) yes  Which pharmacy would you like this sent to? [facility]   Patient notified that their request is being sent to the clinical staff for review and that they should receive a response within 2 business days.

## 2020-06-02 ENCOUNTER — Other Ambulatory Visit: Payer: Self-pay | Admitting: Family Medicine

## 2020-06-02 DIAGNOSIS — F418 Other specified anxiety disorders: Secondary | ICD-10-CM

## 2020-06-14 ENCOUNTER — Other Ambulatory Visit: Payer: Self-pay | Admitting: Family Medicine

## 2020-06-14 DIAGNOSIS — J4530 Mild persistent asthma, uncomplicated: Secondary | ICD-10-CM

## 2020-06-29 ENCOUNTER — Ambulatory Visit (INDEPENDENT_AMBULATORY_CARE_PROVIDER_SITE_OTHER): Payer: 59 | Admitting: Family Medicine

## 2020-06-29 ENCOUNTER — Telehealth: Payer: Self-pay | Admitting: Pharmacist

## 2020-06-29 ENCOUNTER — Encounter: Payer: Self-pay | Admitting: Family Medicine

## 2020-06-29 ENCOUNTER — Other Ambulatory Visit: Payer: Self-pay

## 2020-06-29 VITALS — BP 119/82 | HR 60 | Temp 97.1°F | Ht 65.0 in | Wt 204.0 lb

## 2020-06-29 DIAGNOSIS — J410 Simple chronic bronchitis: Secondary | ICD-10-CM

## 2020-06-29 DIAGNOSIS — E1165 Type 2 diabetes mellitus with hyperglycemia: Secondary | ICD-10-CM | POA: Diagnosis not present

## 2020-06-29 DIAGNOSIS — E1169 Type 2 diabetes mellitus with other specified complication: Secondary | ICD-10-CM | POA: Diagnosis not present

## 2020-06-29 DIAGNOSIS — E785 Hyperlipidemia, unspecified: Secondary | ICD-10-CM

## 2020-06-29 DIAGNOSIS — Z720 Tobacco use: Secondary | ICD-10-CM

## 2020-06-29 DIAGNOSIS — E1159 Type 2 diabetes mellitus with other circulatory complications: Secondary | ICD-10-CM | POA: Diagnosis not present

## 2020-06-29 DIAGNOSIS — I152 Hypertension secondary to endocrine disorders: Secondary | ICD-10-CM

## 2020-06-29 DIAGNOSIS — Z9889 Other specified postprocedural states: Secondary | ICD-10-CM

## 2020-06-29 DIAGNOSIS — M549 Dorsalgia, unspecified: Secondary | ICD-10-CM

## 2020-06-29 LAB — BAYER DCA HB A1C WAIVED: HB A1C (BAYER DCA - WAIVED): 6.7 % (ref <7.0)

## 2020-06-29 MED ORDER — ANORO ELLIPTA 62.5-25 MCG/INH IN AEPB
1.0000 | INHALATION_SPRAY | Freq: Every day | RESPIRATORY_TRACT | 0 refills | Status: DC
Start: 2020-06-29 — End: 2020-06-29

## 2020-06-29 MED ORDER — ANORO ELLIPTA 62.5-25 MCG/INH IN AEPB: 1.0000 | INHALATION_SPRAY | Freq: Every day | RESPIRATORY_TRACT | 0 refills | Status: DC

## 2020-06-29 NOTE — Telephone Encounter (Signed)
Lmtcb.

## 2020-06-29 NOTE — Telephone Encounter (Signed)
Please let patient know:  Free 30 day anoro ellipta coupon left up front for patient.  She can pick up coupon and take to [employer].  Expires 07/03/2020  Highly encourage patient to use as she is losing insurance 1st of the year

## 2020-06-29 NOTE — Patient Instructions (Addendum)
You had labs performed today.  You will be contacted with the results of the labs once they are available, usually in the next 3 business days for routine lab work.  If you have an active my chart account, they will be released to your MyChart.  If you prefer to have these labs released to you via telephone, please let us know.  If you had a pap smear or biopsy performed, expect to be contacted in about 7-10 days.  See Raynelle Fanning, our pharmacist, for meds ASAP

## 2020-06-29 NOTE — Telephone Encounter (Signed)
Patient aware of coupon being at the front. Verbalized understanding

## 2020-06-29 NOTE — Progress Notes (Signed)
Subjective: CC: DM PCP: [name], DO GYI:RSWNIOEV Nancy Thornton is a 59 y.o. female presenting to clinic today for:  1. Type 2 Diabetes with hypertension, hyperlipidemia:  She is compliant with her Metformin, atenolol, Zestoretic and Lipitor.  Last eye exam: Done at my eye doctor in [place] Last foot exam: Needs Last A1c:  Lab Results  Component Value Date   HGBA1C 7.0 (H) 02/04/2020   Nephropathy screen indicated?:  Needs Last flu, zoster and/or pneumovax:  Immunization History  Administered Date(s) Administered  . Influenza, High Dose Seasonal PF 03/30/2018  . Influenza, Quadrivalent, Recombinant, Inj, Pf 03/20/2019  . Influenza, Seasonal, Injecte, Preservative Fre 04/18/2013, 04/12/2014, 03/12/2015, 04/08/2017  . Influenza,inj,Quad PF,6+ Mos 04/18/2013, 03/12/2015, 04/08/2017, 03/30/2018  . Influenza-Unspecified 04/12/2014, 03/20/2019, 04/25/2020  . Moderna Sars-Covid-2 Vaccination 09/20/2019, 10/22/2019, 02/24/2020  . Pneumococcal Conjugate-13 09/05/2013  . Pneumococcal Polysaccharide-23 03/12/2015  . Td 01/09/2017  . Tdap 01/09/2017    ROS: No chest pain, foot ulcerations.  2. Chronic bronchitis, Tobacco use Patient continues to smoke but is trying to cut down her smoking.  She uses her albuterol inhaler twice daily.  No reports of hemoptysis or change in breathing since last visit.  3.  Chronic back pain Patient with ongoing sciatica down the left lower extremity.  She reports numbness and tingling bilaterally despite surgery 3 months ago.  She is had degenerative changes in the left knee where her orthopedic told her that she always has bone-on-bone.  She reports clicking and pain in that left knee.  She in fact does have a Baker's cyst but they did not feel like it was large enough to drain.  No falls but she admits that she is not able to stand for very long without holding onto something like a cart.  She has been fact using a cart at work to support her if  she has to do any activity standing.  She is not been able to tolerate more than 10 hours/week of work due to physical issues.  ROS: Per HPI  Allergies  Allergen Reactions  . Penicillins Hives    Did it involve swelling of the face/tongue/throat, SOB, or low BP? No Did it involve sudden or severe rash/hives, skin peeling, or any reaction on the inside of your mouth or nose? Yes Did you need to seek medical attention at a hospital or doctor's office? No When did it last happen?Occurred at age 37 If all above answers are "NO", may proceed with cephalosporin use.   . Meloxicam Hypertension  . Bactrim [Sulfamethoxazole-Trimethoprim] Rash   Past Medical History:  Diagnosis Date  . Anal fissure    history of  . Asthma   . Depression   . Diabetes mellitus without complication ([place])   . DM (diabetes mellitus) ([place]) 10/19/2012  . Hypertension   . Lichen    hands and feet  . Obesity   . Substance abuse ([place])    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    Current Outpatient Medications:  .  albuterol (VENTOLIN HFA) 108 (90 Base) MCG/ACT inhaler, INHALE 2 PUFFS BY MOUTH EVERY 6 HOURS AS NEEDED FOR WHEEZING AND FOR SHORTNESS OF BREATH, Disp: 9 g, Rfl: 0 .  atenolol (TENORMIN) 50 MG tablet, Take 1 tablet by mouth once daily, Disp: 90 tablet, Rfl: 1 .  atorvastatin (LIPITOR) 10 MG tablet, Take 1 tablet by mouth once daily (Patient taking differently: Take 10 mg by mouth daily.), Disp: 90 tablet, Rfl: 3 .  gabapentin (NEURONTIN) 300  MG capsule, TAKE 1 CAPSULE BY MOUTH THREE TIMES DAILY, Disp: 90 capsule, Rfl: 2 .  lisinopril-hydrochlorothiazide (ZESTORETIC) 20-25 MG tablet, Take 1 tablet by mouth daily., Disp: 90 tablet, Rfl: 3 .  Melatonin 5 MG TABS, Take 5 mg by mouth at bedtime., Disp: , Rfl:  .  metFORMIN (GLUCOPHAGE) 1000 MG tablet, TAKE 1 TABLET BY MOUTH TWICE DAILY WITH A MEAL (Patient taking differently: Take 1,000 mg by mouth 2 (two) times daily with a meal.), Disp: 180 tablet, Rfl:  3 .  Omega-3 Fatty Acids (FISH OIL) 1000 MG CAPS, Take 1,000 mg by mouth daily.  (Patient not taking: Reported on 06/29/2020), Disp: , Rfl:  Social History   Socioeconomic History  . Marital status: Divorced    Spouse name: Not on file  . Number of children: 1  . Years of education: Not on file  . Highest education level: Not on file  Occupational History  . 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  Tobacco Use  . Smoking status: Light Tobacco Smoker    Packs/day: 0.50    Types: Cigarettes    Start date: 10/18/1977    Last attempt to quit: 02/10/2019    Years since quitting: 1.3  . Smokeless tobacco: Never Used  Vaping Use  . Vaping Use: Never used  Substance and Sexual Activity  . Alcohol use: No  . Drug use: No  . Sexual activity: Not Currently  Other Topics Concern  . Not on file  Social History Narrative  . Not on file   Social Determinants of Health   Financial Resource Strain: Not on file  Food Insecurity: Not on file  Transportation Needs: Not on file  Physical Activity: Not on file  Stress: Not on file  Social Connections: Not on file  Intimate Partner Violence: Not on file   Family History  Problem Relation Age of Onset  . Diabetes Mother   . Hypertension Father   . Colon cancer Neg Hx   . Esophageal cancer Neg Hx   . Rectal cancer Neg Hx   . Stomach cancer Neg Hx     Objective: Office vital signs reviewed. BP 119/82   Pulse 60   Temp (!) 97.1 F (36.2 C) (Temporal)   Ht 5\' 5"  (1.651 m)   Wt 204 lb (92.5 kg)   BMI 33.95 kg/m   Physical Examination:  General: Awake, alert, well nourished, No acute distress HEENT: Normal, sclera white Cardio: regular rate and rhythm, S1S2 heard, no murmurs appreciated Pulm: Global expiratory wheezes.  No rhonchi or rales.  Normal work of breathing on room air MSK: antalgic gait and hunched station; she has audible crepitus of the left knee and a visible limp  Assessment/ Plan: 59 y.o. female    Type 2 diabetes mellitus with hyperglycemia, without long-term current use of insulin (HCC) - Plan: Bayer DCA Hb A1c Waived  Hyperlipidemia associated with type 2 diabetes mellitus (HCC) - Plan: CMP14+EGFR, Lipid Panel  Hypertension associated with diabetes (HCC)  Simple chronic bronchitis (HCC) - Plan: umeclidinium-vilanterol (ANORO ELLIPTA) 62.5-25 MCG/INH AEPB  Tobacco use  Back pain with history of spinal surgery  Sugars well controlled.  Continue current regimen.  Check fasting lipid panel  Blood pressures controlled.  Continue current regimen  Trial of Anoro provided given regular use of the albuterol.  Instructions for use given.  She needs to follow-up with 08-02-1985 in the next week or so for medication assistance, particular given that she will be losing her  insurance next week  Tobacco cessation recommended  She is applying for disability for the back and knee issues.  She gives me verbal permission to release her information to the disability specialist.  Plan for x-rays at the health department soon.  She'll be seeing a disability doctor soon  Orders Placed This Encounter  Procedures  . Bayer DCA Hb A1c Waived  . CMP14+EGFR  . Lipid Panel   No orders of the defined types were placed in this encounter.    [name], DO [place] [phone]

## 2020-06-30 LAB — CMP14+EGFR
ALT: 19 IU/L (ref 0–32)
AST: 18 IU/L (ref 0–40)
Albumin/Globulin Ratio: 3 — ABNORMAL HIGH (ref 1.2–2.2)
Albumin: 4.5 g/dL (ref 3.8–4.9)
Alkaline Phosphatase: 57 IU/L (ref 44–121)
BUN/Creatinine Ratio: 27 — ABNORMAL HIGH (ref 9–23)
BUN: 20 mg/dL (ref 6–24)
Bilirubin Total: 0.6 mg/dL (ref 0.0–1.2)
CO2: 20 mmol/L (ref 20–29)
Calcium: 9.8 mg/dL (ref 8.7–10.2)
Chloride: 97 mmol/L (ref 96–106)
Creatinine, Ser: 0.73 mg/dL (ref 0.57–1.00)
GFR calc Af Amer: 104 mL/min/{1.73_m2} (ref >59)
GFR calc non Af Amer: 90 mL/min/{1.73_m2} (ref >59)
Globulin, Total: 1.5 g/dL (ref 1.5–4.5)
Glucose: 132 mg/dL — ABNORMAL HIGH (ref 65–99)
Potassium: 4.5 mmol/L (ref 3.5–5.2)
Sodium: 141 mmol/L (ref 134–144)
Total Protein: 6 g/dL (ref 6.0–8.5)

## 2020-06-30 LAB — LIPID PANEL
Chol/HDL Ratio: 2.6 ratio (ref 0.0–4.4)
Cholesterol, Total: 110 mg/dL (ref 100–199)
HDL: 42 mg/dL (ref >39)
LDL Chol Calc (NIH): 40 mg/dL (ref 0–99)
Triglycerides: 167 mg/dL — ABNORMAL HIGH (ref 0–149)
VLDL Cholesterol Cal: 28 mg/dL (ref 5–40)

## 2020-07-28 ENCOUNTER — Other Ambulatory Visit: Payer: Self-pay | Admitting: Family Medicine

## 2020-08-06 ENCOUNTER — Ambulatory Visit: Payer: 59 | Admitting: Family Medicine

## 2020-10-17 ENCOUNTER — Other Ambulatory Visit: Payer: Self-pay | Admitting: Family Medicine

## 2020-10-17 DIAGNOSIS — E1165 Type 2 diabetes mellitus with hyperglycemia: Secondary | ICD-10-CM

## 2020-10-18 IMAGING — RF DG LUMBAR SPINE 2-3V
1 series · 15 of 24 positions shown · non-contrast
Comparison: MRI lumbar spine 09/11/2018.

CLINICAL DATA: Intraoperative imaging for L4-5 fusion.

EXAM:
LUMBAR SPINE - 2-3 VIEW; DG C-ARM 1-60 MIN

[Series 1: unknown protocol · 0.14mm/px · 15 of 27 slices shown]
[im 1/27]
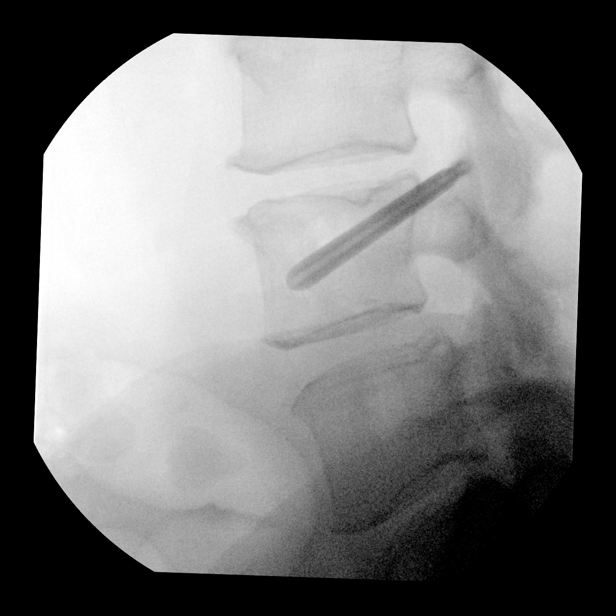
[im 3/27]
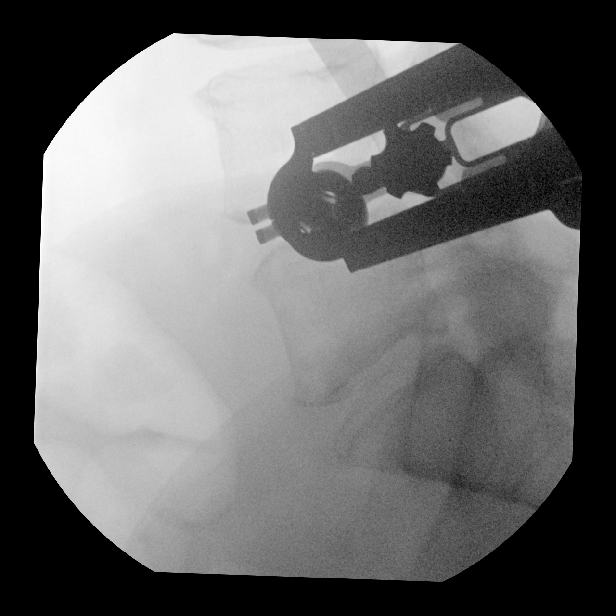
[im 5/27]
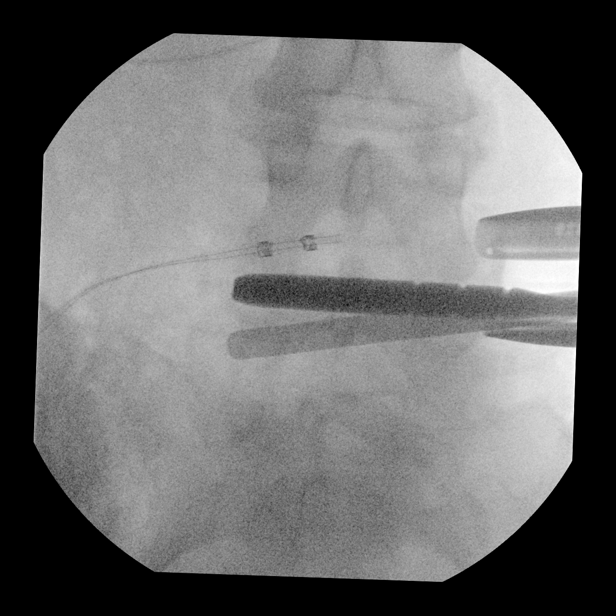
[im 6/27]
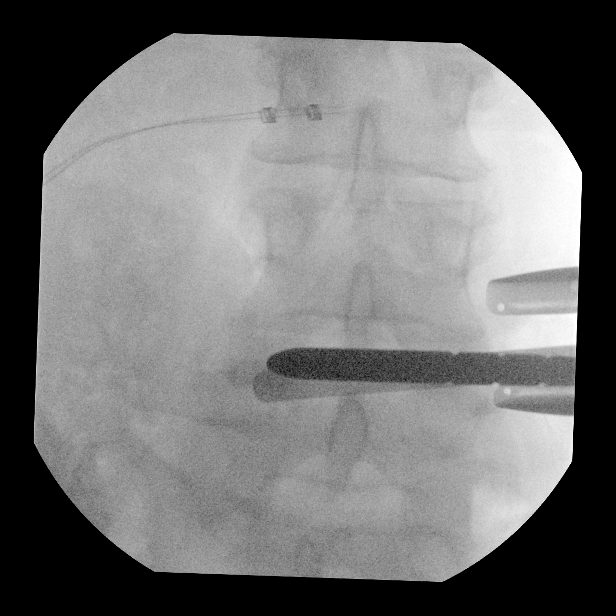
[im 8/27]
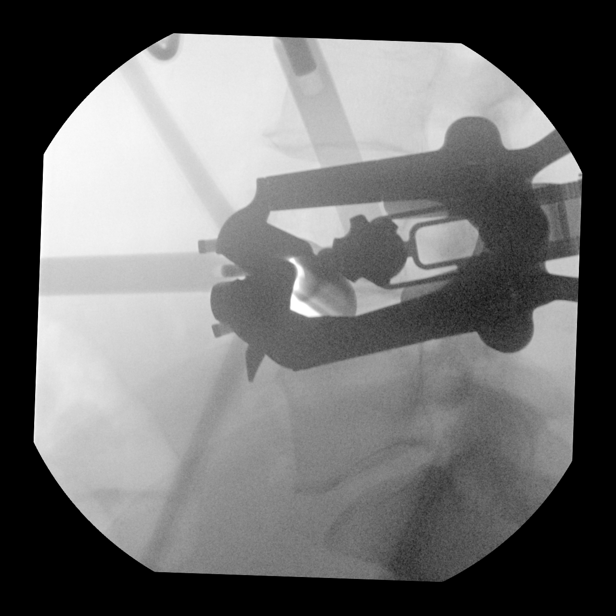
[im 10/27]
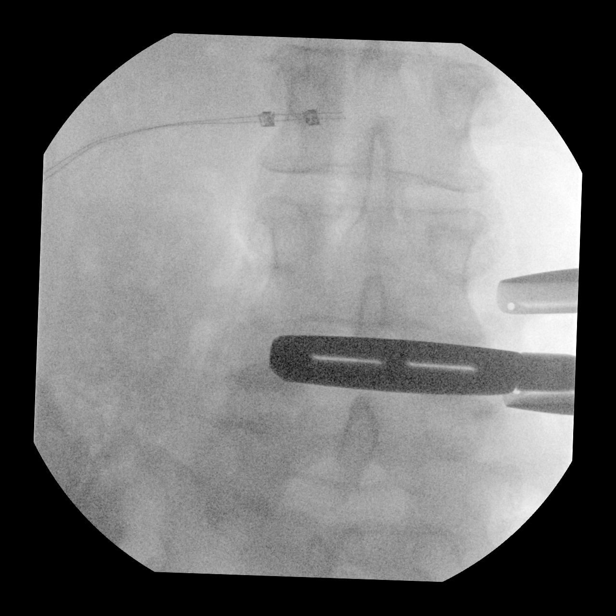
[im 12/27]
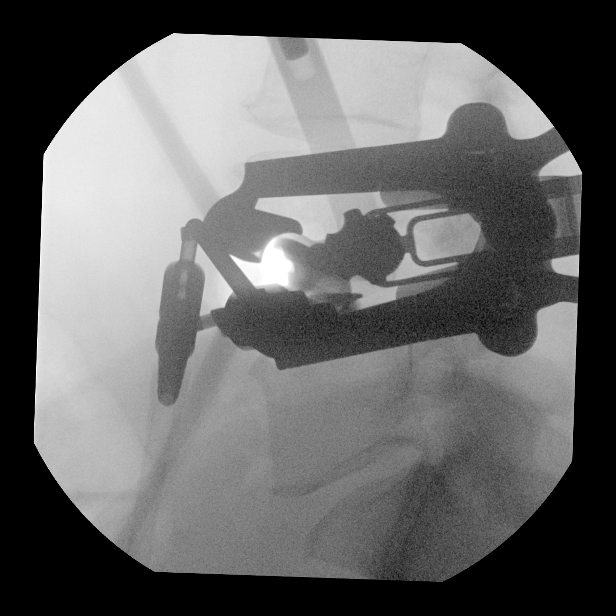
[im 14/27]
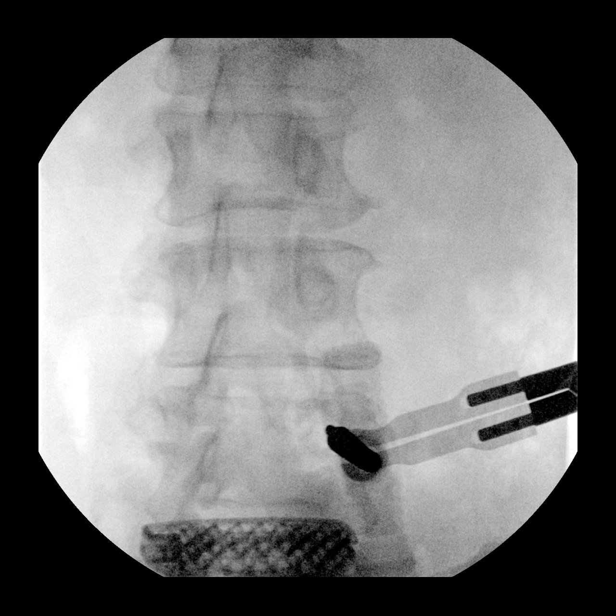
[im 15/27]
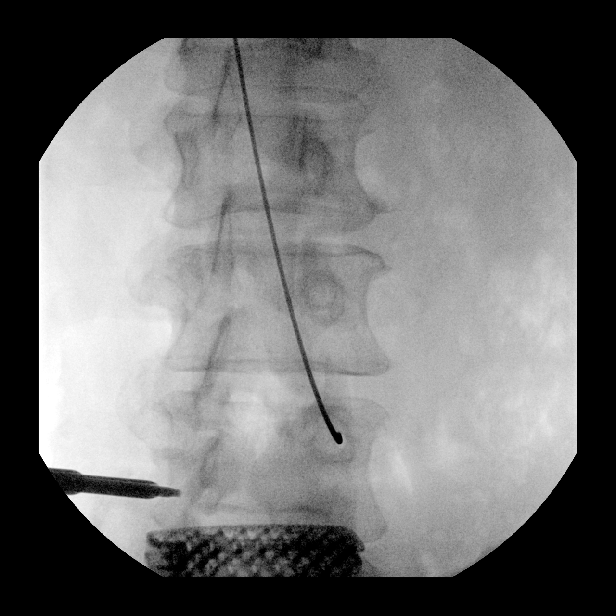
[im 17/27]
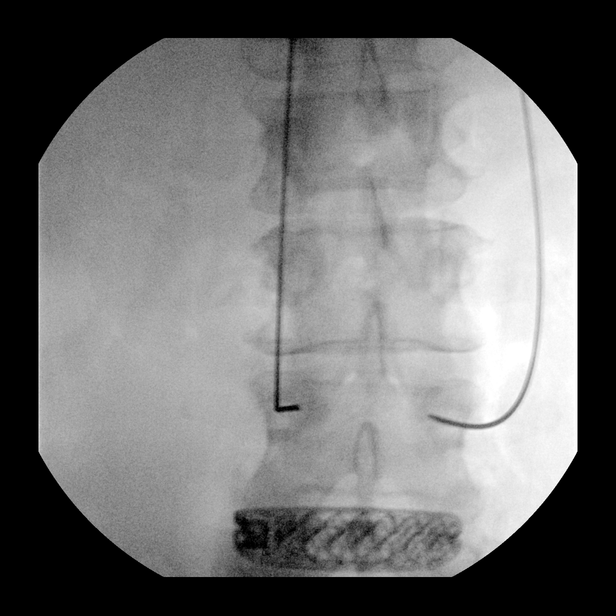
[im 19/27]
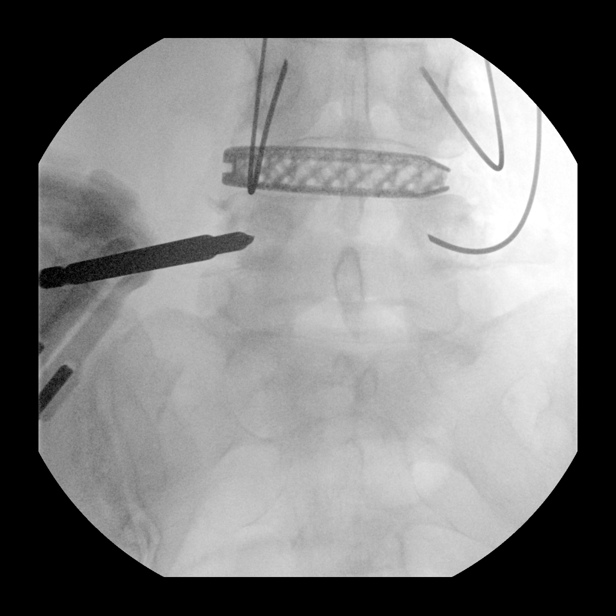
[im 21/27]
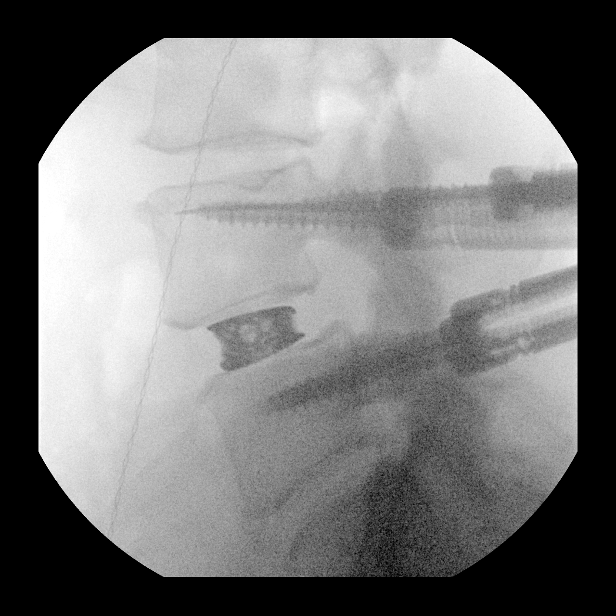
[im 23/27]
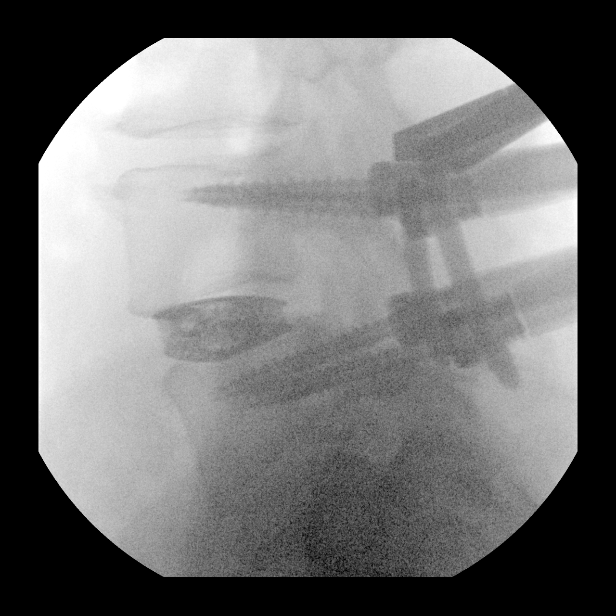
[im 24/27]
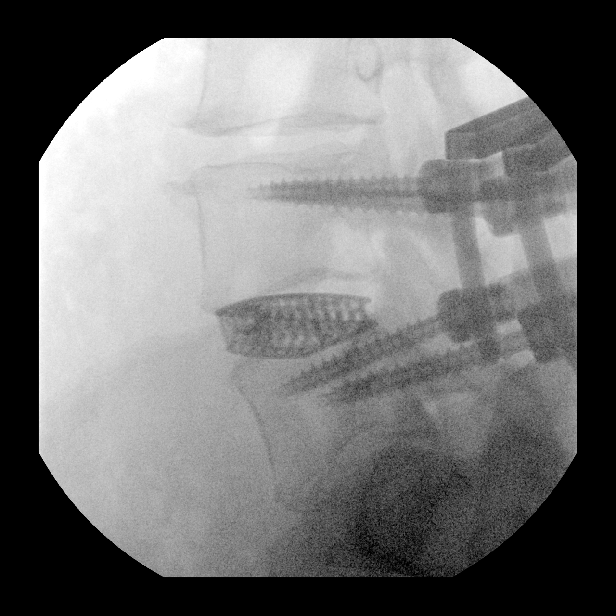
[im 27/27]
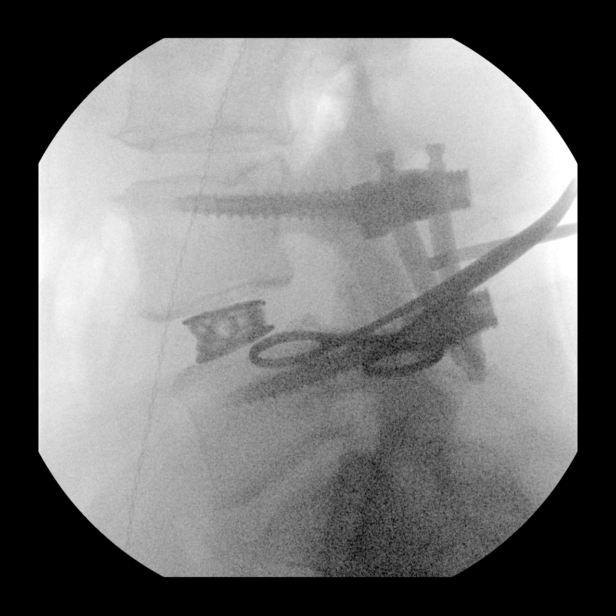

[15 of 24 positions shown; findings below may reference images not displayed]

FINDINGS: We are provided with a series intraoperative fluoroscopic spot views
of the lower lumbar spine. Images demonstrate placement of an
interbody spacer, pedicle screws and stabilization bars at L4-5. No
acute abnormality is identified.
IMPRESSION: Intraoperative imaging for L4-5 fusion.

## 2020-10-18 IMAGING — RF DG C-ARM 1-60 MIN
1 series · 15 of 24 positions shown · non-contrast
Comparison: MRI lumbar spine 09/11/2018.

CLINICAL DATA: Intraoperative imaging for L4-5 fusion.

EXAM:
LUMBAR SPINE - 2-3 VIEW; DG C-ARM 1-60 MIN

[Series 1: unknown protocol · 0.14mm/px · 15 of 25 slices shown]
[im 1/25]
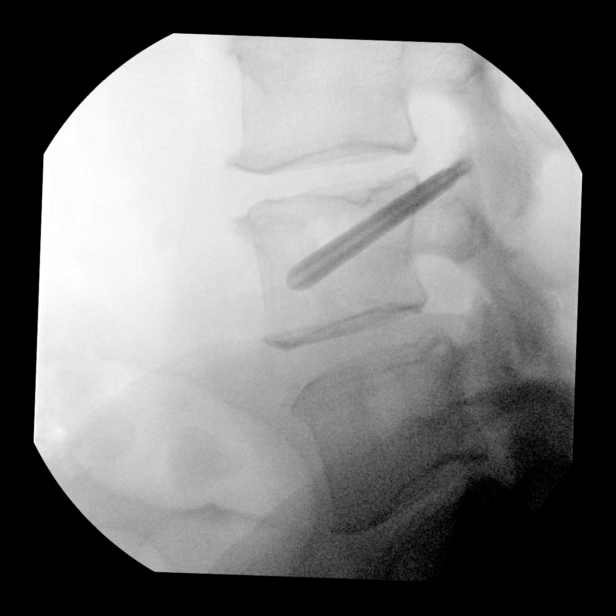
[im 3/25]
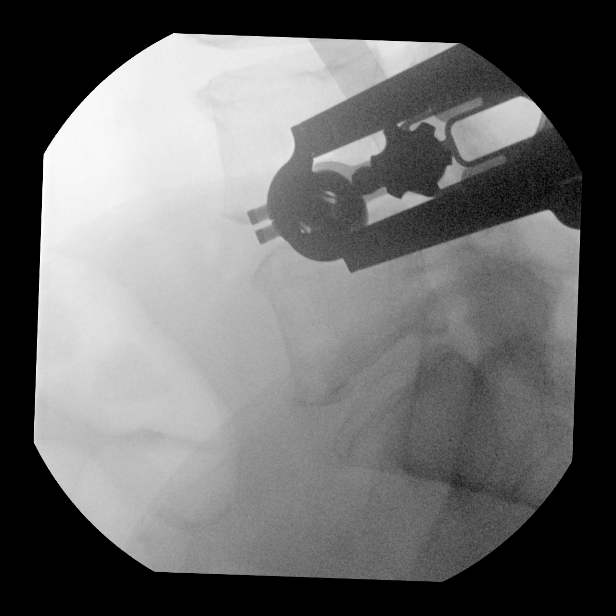
[im 5/25]
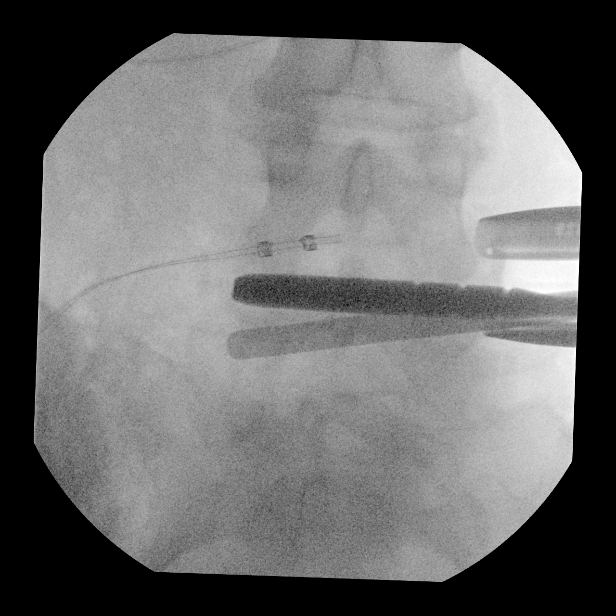
[im 6/25]
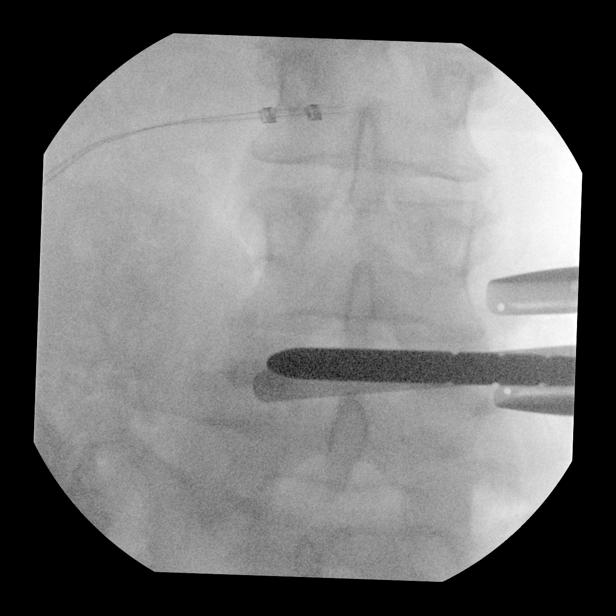
[im 8/25]
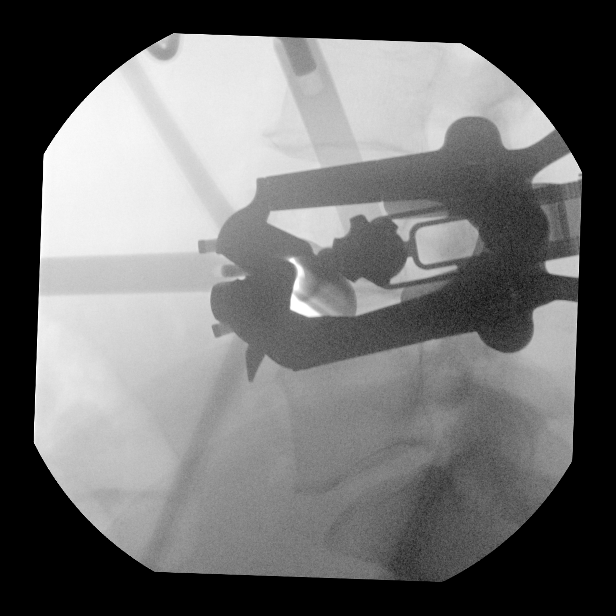
[im 9/25]
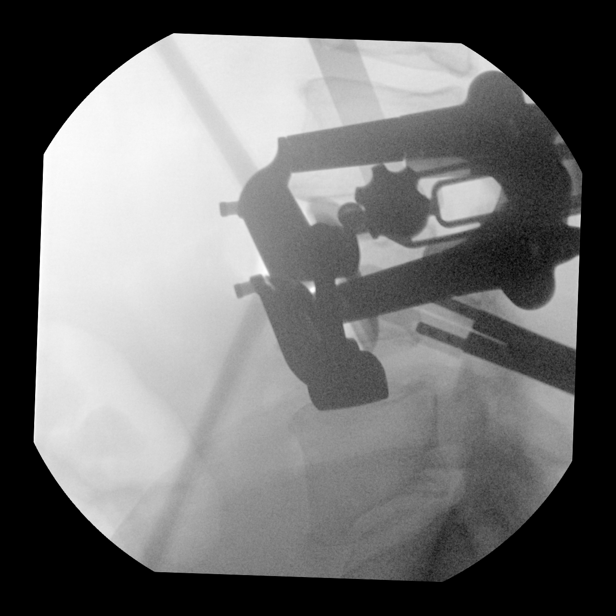
[im 11/25]
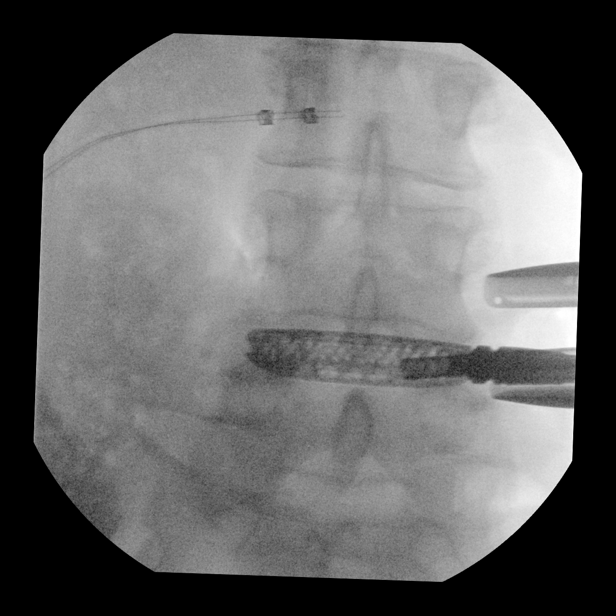
[im 13/25]
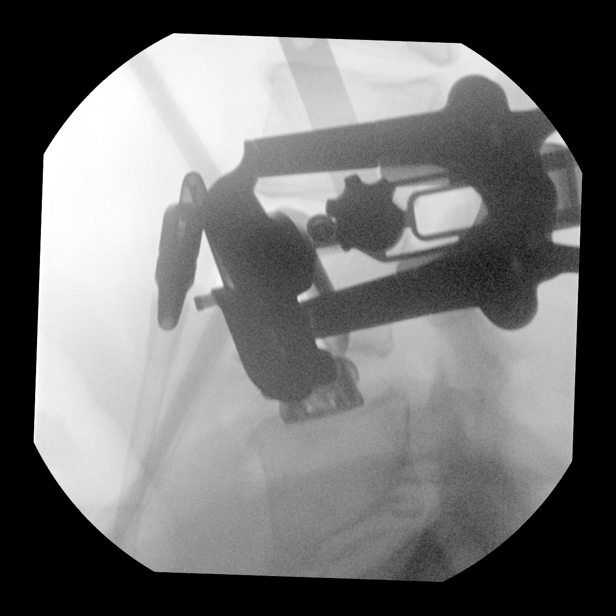
[im 14/25]
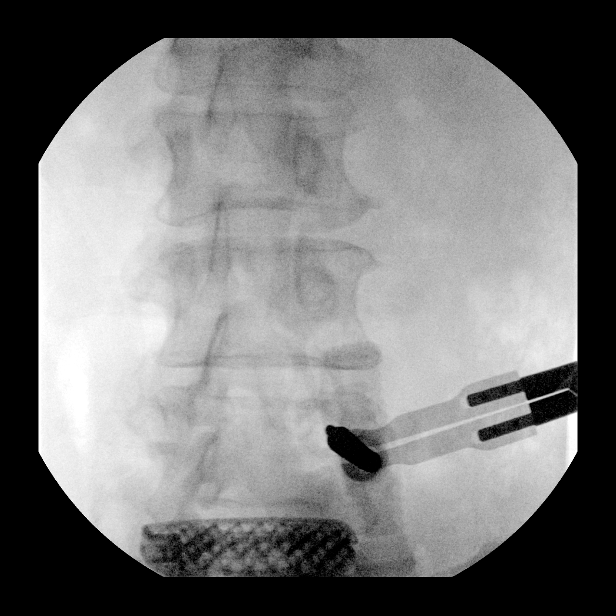
[im 16/25]
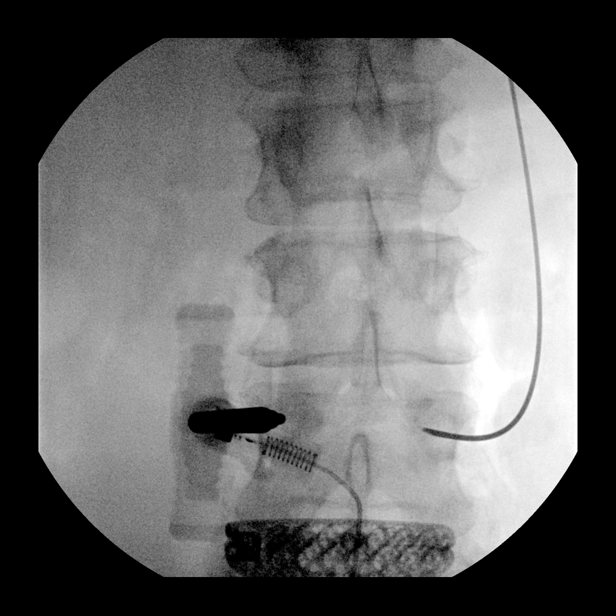
[im 17/25]
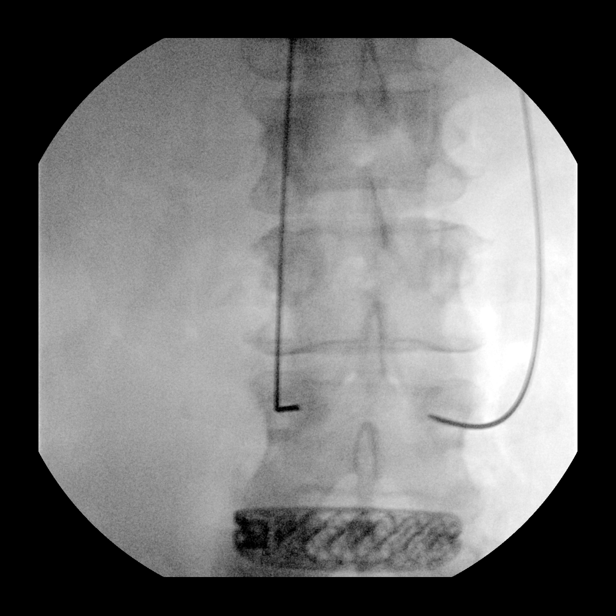
[im 19/25]
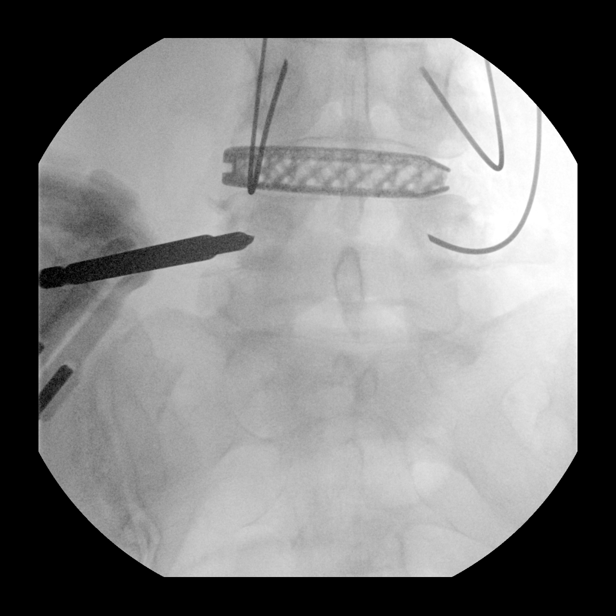
[im 21/25]
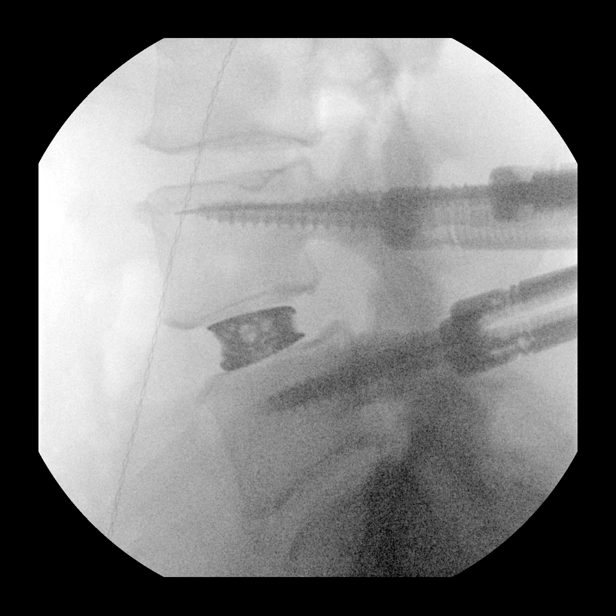
[im 22/25]
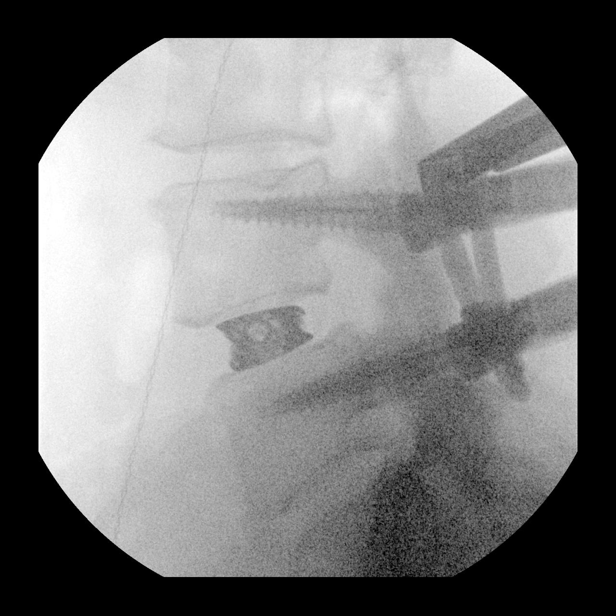
[im 25/25]
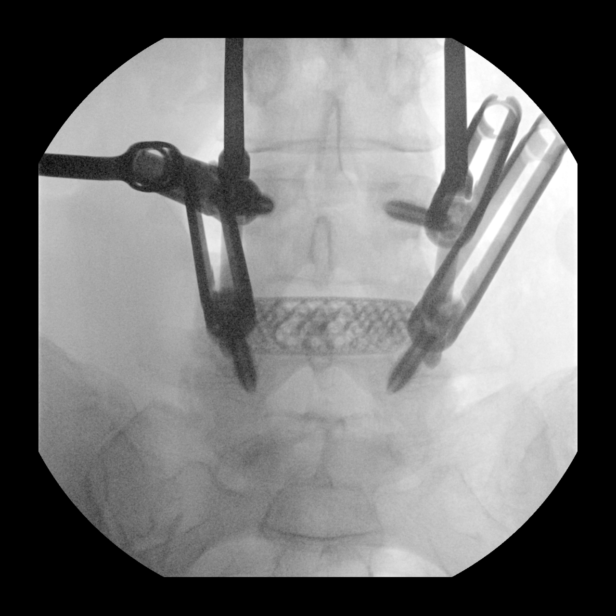

[15 of 24 positions shown; findings below may reference images not displayed]

FINDINGS: We are provided with a series intraoperative fluoroscopic spot views
of the lower lumbar spine. Images demonstrate placement of an
interbody spacer, pedicle screws and stabilization bars at L4-5. No
acute abnormality is identified.
IMPRESSION: Intraoperative imaging for L4-5 fusion.

## 2020-10-18 IMAGING — RF DG C-ARM 1-60 MIN
1 series · 15 of 24 positions shown · non-contrast
Comparison: MRI lumbar spine 09/11/2018.

CLINICAL DATA: Intraoperative imaging for L4-5 fusion.

EXAM:
LUMBAR SPINE - 2-3 VIEW; DG C-ARM 1-60 MIN

[Series 1: unknown protocol · 0.14mm/px · 15 of 25 slices shown]
[im 1/25]
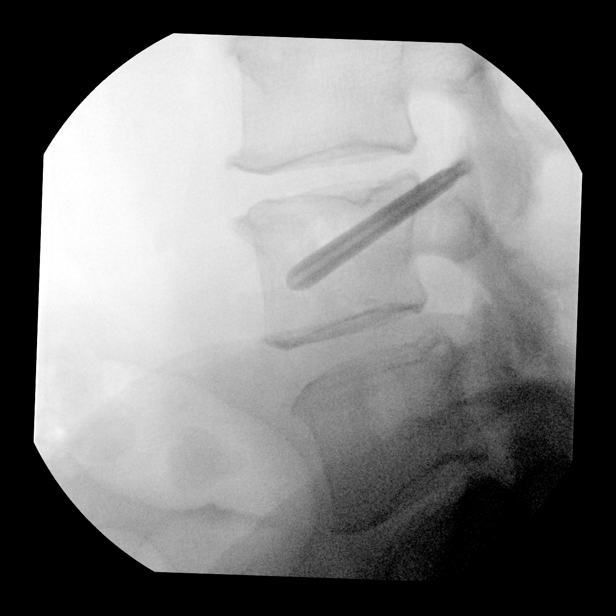
[im 3/25]
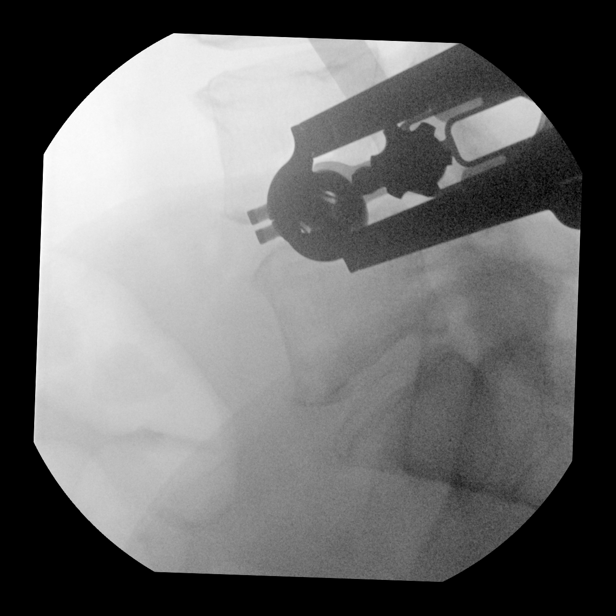
[im 5/25]
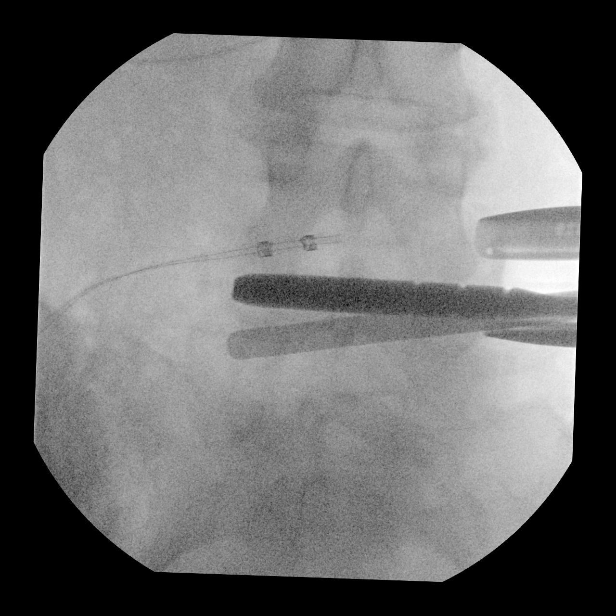
[im 6/25]
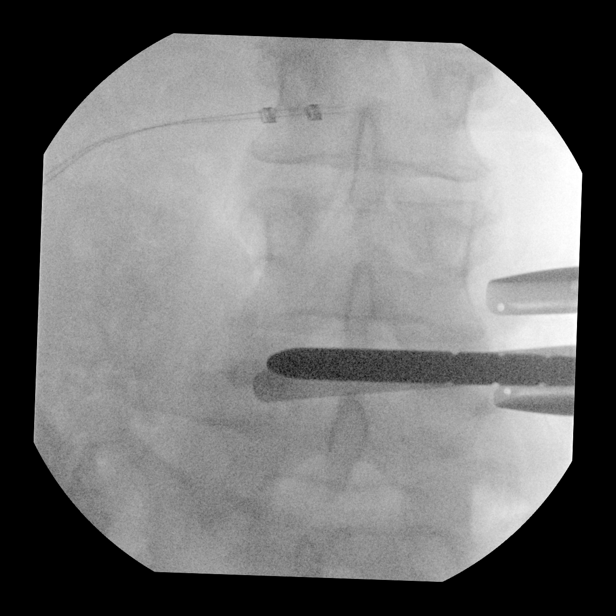
[im 8/25]
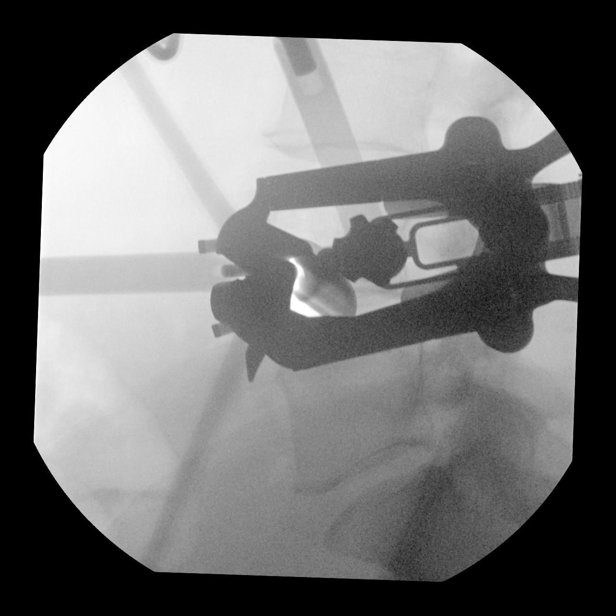
[im 9/25]
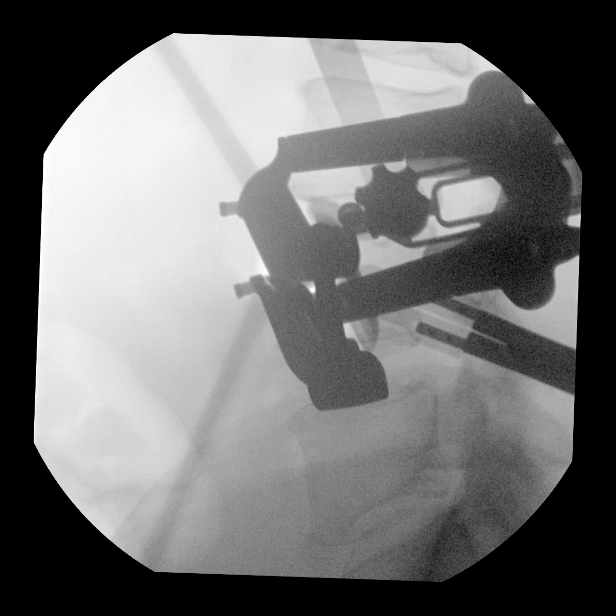
[im 11/25]
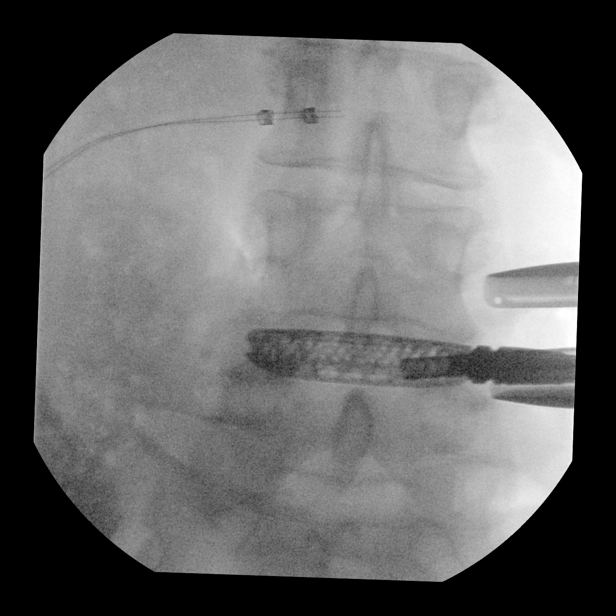
[im 13/25]
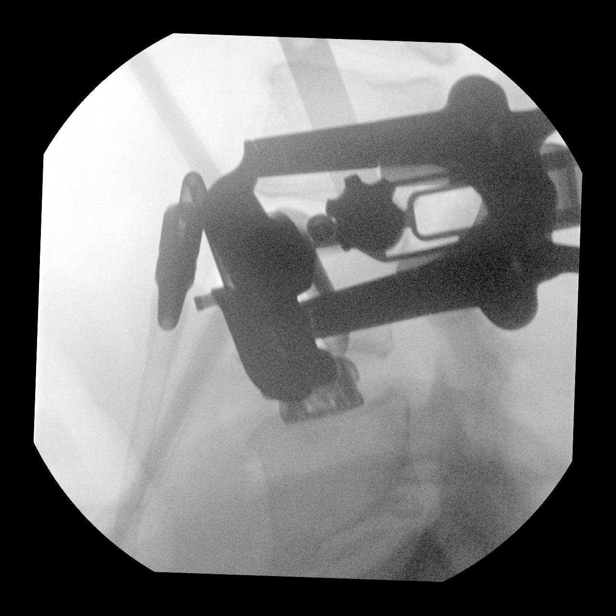
[im 14/25]
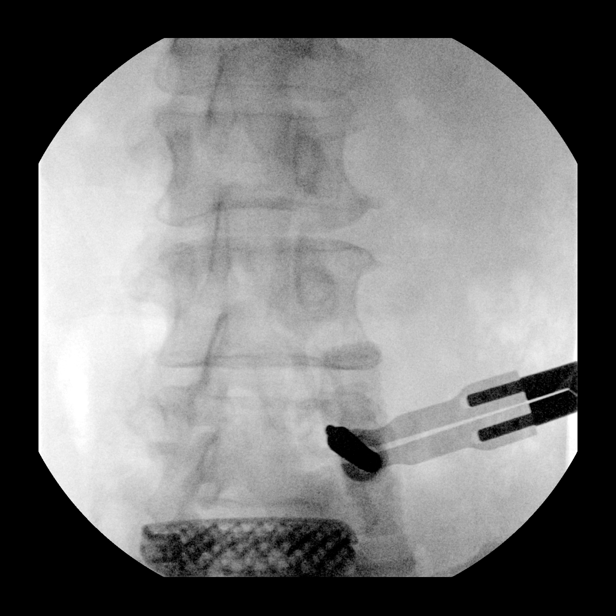
[im 16/25]
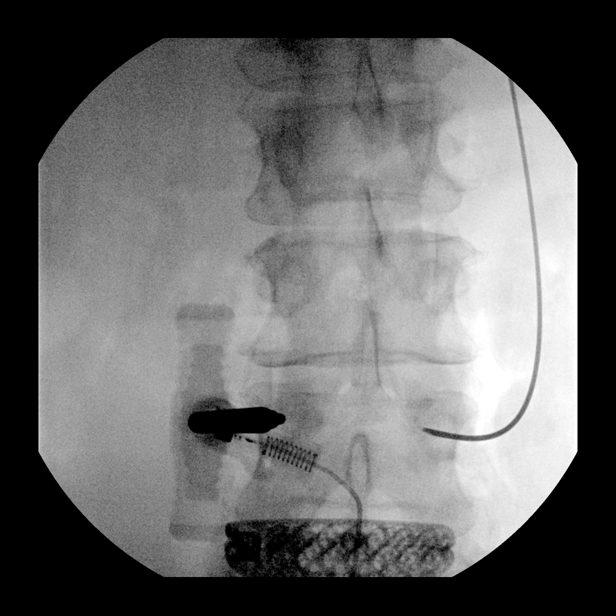
[im 17/25]
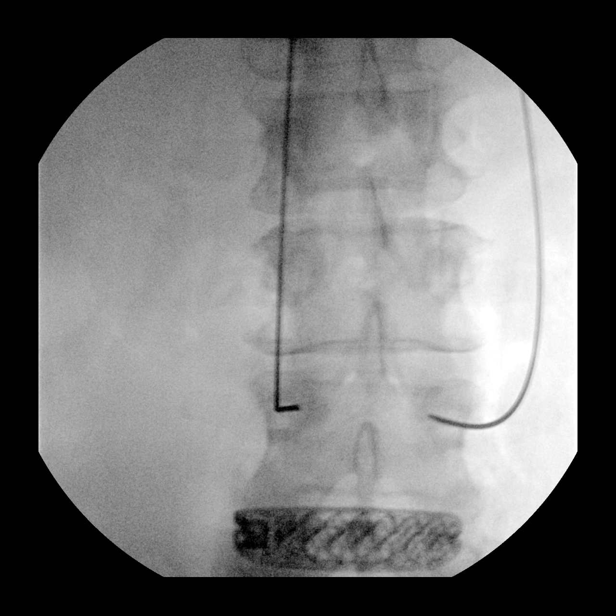
[im 19/25]
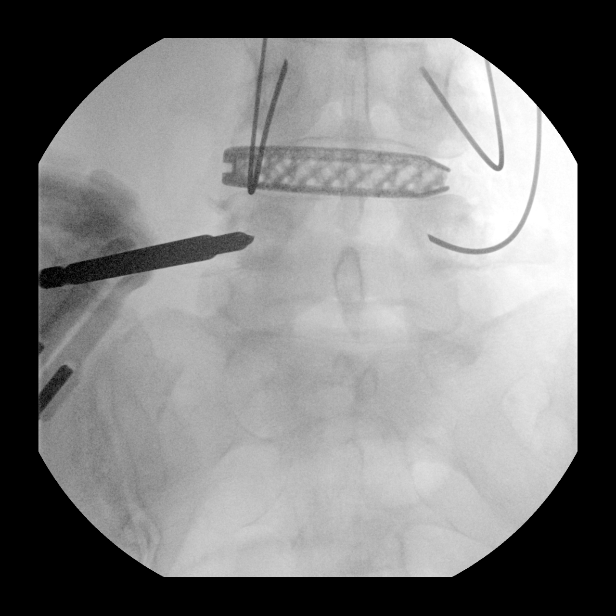
[im 21/25]
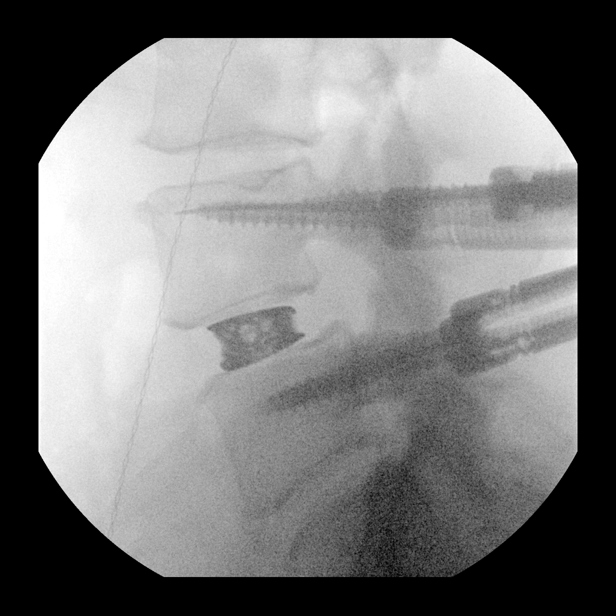
[im 22/25]
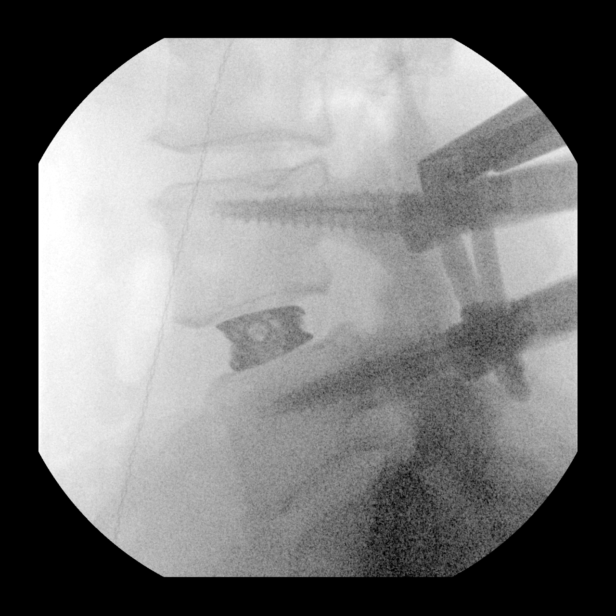
[im 25/25]
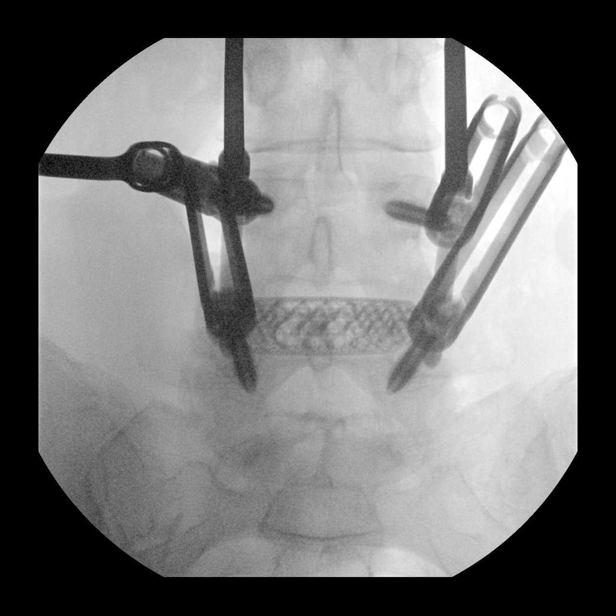

[15 of 24 positions shown; findings below may reference images not displayed]

FINDINGS: We are provided with a series intraoperative fluoroscopic spot views
of the lower lumbar spine. Images demonstrate placement of an
interbody spacer, pedicle screws and stabilization bars at L4-5. No
acute abnormality is identified.
IMPRESSION: Intraoperative imaging for L4-5 fusion.

## 2020-10-23 ENCOUNTER — Other Ambulatory Visit: Payer: Self-pay | Admitting: Family Medicine

## 2020-10-23 DIAGNOSIS — I152 Hypertension secondary to endocrine disorders: Secondary | ICD-10-CM

## 2020-10-23 DIAGNOSIS — E1159 Type 2 diabetes mellitus with other circulatory complications: Secondary | ICD-10-CM

## 2020-10-28 ENCOUNTER — Other Ambulatory Visit: Payer: Self-pay

## 2020-10-28 ENCOUNTER — Ambulatory Visit (INDEPENDENT_AMBULATORY_CARE_PROVIDER_SITE_OTHER): Payer: Self-pay | Admitting: Family Medicine

## 2020-10-28 ENCOUNTER — Encounter: Payer: Self-pay | Admitting: Family Medicine

## 2020-10-28 VITALS — BP 131/80 | HR 62 | Temp 97.6°F | Ht 65.0 in | Wt 200.0 lb

## 2020-10-28 DIAGNOSIS — R42 Dizziness and giddiness: Secondary | ICD-10-CM

## 2020-10-28 DIAGNOSIS — E1159 Type 2 diabetes mellitus with other circulatory complications: Secondary | ICD-10-CM

## 2020-10-28 DIAGNOSIS — E1165 Type 2 diabetes mellitus with hyperglycemia: Secondary | ICD-10-CM

## 2020-10-28 DIAGNOSIS — M5412 Radiculopathy, cervical region: Secondary | ICD-10-CM

## 2020-10-28 DIAGNOSIS — I152 Hypertension secondary to endocrine disorders: Secondary | ICD-10-CM

## 2020-10-28 DIAGNOSIS — E785 Hyperlipidemia, unspecified: Secondary | ICD-10-CM

## 2020-10-28 DIAGNOSIS — E1169 Type 2 diabetes mellitus with other specified complication: Secondary | ICD-10-CM

## 2020-10-28 DIAGNOSIS — J410 Simple chronic bronchitis: Secondary | ICD-10-CM

## 2020-10-28 LAB — BAYER DCA HB A1C WAIVED: HB A1C (BAYER DCA - WAIVED): 6.4 % (ref <7.0)

## 2020-10-28 MED ORDER — PREDNISONE 10 MG PO TABS: ORAL_TABLET | ORAL | 0 refills | Status: DC

## 2020-10-28 MED ORDER — CYCLOBENZAPRINE HCL 10 MG PO TABS: 10.0000 mg | ORAL_TABLET | Freq: Three times a day (TID) | ORAL | 0 refills | Status: DC | PRN

## 2020-10-28 NOTE — Progress Notes (Signed)
Subjective: CC: DM PCP: Raliegh Ip, DO ZOX:[id number] Kammerer is a 60 y.o. female presenting to clinic today for:  1. Type 2 Diabetes with hypertension, hyperlipidemia:  Patient reports compliance with her medications.  No chest pain, edema.  She does report some intermittent dizziness that has been ongoing and becoming more frequent.  Does not report any falls, balance abnormalities, change in speech or unilateral sensory changes or weakness.  She does not wish to see a neurologist just yet due to lack of insurance  Last eye exam: needs Last foot exam: needs Last A1c:  Lab Results  Component Value Date   HGBA1C 6.7 06/29/2020   Nephropathy screen indicated?: on ARB Last flu, zoster and/or pneumovax:  Immunization History  Administered Date(s) Administered  . Influenza, High Dose Seasonal PF 03/30/2018  . Influenza, Quadrivalent, Recombinant, Inj, Pf 03/20/2019  . Influenza, Seasonal, Injecte, Preservative Fre 04/18/2013, 04/12/2014, 03/12/2015, 04/08/2017  . Influenza,inj,Quad PF,6+ Mos 04/18/2013, 03/12/2015, 04/08/2017, 03/30/2018  . Influenza-Unspecified 04/12/2014, 03/20/2019, 04/25/2020  . Moderna Sars-Covid-2 Vaccination 09/20/2019, 10/22/2019, 02/24/2020  . Pneumococcal Conjugate-13 09/05/2013  . Pneumococcal Polysaccharide-23 03/12/2015  . Td 01/09/2017  . Tdap 01/09/2017    2.  Neck pain Patient reports history of neck pain but now she has radicular symptoms down the left upper extremity.  She reports a sharp, stabbing pain that seems to occur frequently now.  Lifting her left upper extremity behind her neck seem to alleviate pain.  She is used Motrin 600 mg up to every 4-6 hours with some relief but always wears off.  She is also been using gabapentin but this does not seem to be helping.  She is been seeing chiropractor and has done 4 sessions thus far.  Each session does seem to help but does not last.  Again, she has avoided specialty medicine due to lack of  insurance.  3.  COPD Patient notes the Anoro was very helpful with her breathing issues but this was totally unaffordable at over $500 per month.  She is no longer insured and therefore has not used this.  She admits to wheezing, coughing  ROS: Per HPI  Allergies  Allergen Reactions  . Penicillins Hives    Did it involve swelling of the face/tongue/throat, SOB, or low BP? No Did it involve sudden or severe rash/hives, skin peeling, or any reaction on the inside of your mouth or nose? Yes Did you need to seek medical attention at a hospital or doctor's office? No When did it last happen?Occurred at age 89 If all above answers are "NO", may proceed with cephalosporin use.   . Meloxicam Hypertension  . Bactrim [Sulfamethoxazole-Trimethoprim] Rash   Past Medical History:  Diagnosis Date  . Anal fissure    history of  . Asthma   . Depression   . Diabetes mellitus without complication (HCC)   . DM (diabetes mellitus) (HCC) 10/19/2012  . Hypertension   . Lichen    hands and feet  . Obesity   . 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  albuterol (VENTOLIN HFA) 108 (90 Base) MCG/ACT inhaler, INHALE 2 PUFFS BY MOUTH EVERY 6 HOURS AS NEEDED FOR WHEEZING AND FOR SHORTNESS OF BREATH, Disp: 9 g, Rfl: 0 .  atenolol (TENORMIN) 50 MG tablet, Take 1 tablet by mouth once daily, Disp: 90 tablet, Rfl: 0 .  atorvastatin (LIPITOR) 10 MG tablet, Take 1 tablet by mouth once daily (Patient  taking differently: Take 10 mg by mouth daily.), Disp: 90 tablet, Rfl: 3 .  gabapentin (NEURONTIN) 300 MG capsule, TAKE 1 CAPSULE BY MOUTH THREE TIMES DAILY, Disp: 90 capsule, Rfl: 2 .  lisinopril-hydrochlorothiazide (ZESTORETIC) 20-25 MG tablet, Take 1 tablet by mouth daily., Disp: 90 tablet, Rfl: 3 .  Melatonin 5 MG TABS, Take 5 mg by mouth at bedtime., Disp: , Rfl:  .  metFORMIN (GLUCOPHAGE) 1000 MG tablet, TAKE 1 TABLET BY MOUTH TWICE DAILY WITH A  MEAL, Disp: 60 tablet, Rfl: 0 .  Omega-3 Fatty Acids (FISH OIL) 1000 MG CAPS, Take 1,000 mg by mouth daily.  (Patient not taking: Reported on 06/29/2020), Disp: , Rfl:  .  umeclidinium-vilanterol (ANORO ELLIPTA) 62.5-25 MCG/INH AEPB, Inhale 1 puff into the lungs daily., Disp: 28 each, Rfl: 0 Social History   Socioeconomic History  . Marital status: Divorced    Spouse name: Not on file  . Number of children: 1  . Years of education: Not on file  . Highest education level: Not on file  Occupational History  . 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  Tobacco Use  . Smoking status: Light Tobacco Smoker    Packs/day: 0.50    Types: Cigarettes    Start date: 10/18/1977    Last attempt to quit: 02/10/2019    Years since quitting: 1.7  . Smokeless tobacco: Never Used  Vaping Use  . Vaping Use: Never used  Substance and Sexual Activity  . Alcohol use: No  . Drug use: No  . Sexual activity: Not Currently  Other Topics Concern  . Not on file  Social History Narrative  . Not on file   Social Determinants of Health   Financial Resource Strain: Not on file  Food Insecurity: Not on file  Transportation Needs: Not on file  Physical Activity: Not on file  Stress: Not on file  Social Connections: Not on file  Intimate Partner Violence: Not on file   Family History  Problem Relation Age of Onset  . Diabetes Mother   . Hypertension Father   . Colon cancer Neg Hx   . Esophageal cancer Neg Hx   . Rectal cancer Neg Hx   . Stomach cancer Neg Hx     Objective: Office vital signs reviewed. BP 131/80   Pulse 62   Temp 97.6 F (36.4 C)   Ht 5\' 5"  (1.651 m)   Wt 200 lb (90.7 kg)   SpO2 98%   BMI 33.28 kg/m   Physical Examination:  General: Awake, alert, well nourished, No acute distress HEENT: Normal; sclera white.  PERRLA Cardio: regular rate and rhythm, S1S2 heard, no murmurs appreciated Pulm: Expiratory wheezes noted.  Normal work of breathing on room  air Extremities: warm, well perfused, No edema, cyanosis or clubbing; +2 pulses bilaterally MSK: Ambulating independently with normal gait Neuro: No focal neurologic deficits.  Patient is alert and oriented  Assessment/ Plan: 60 y.o. female   Type 2 diabetes mellitus with hyperglycemia, without long-term current use of insulin (HCC) - Plan: Bayer DCA Hb A1c Waived  Hyperlipidemia associated with type 2 diabetes mellitus (HCC)  Hypertension associated with diabetes (HCC)  Cervical radicular pain - Plan: predniSONE (DELTASONE) 10 MG tablet, cyclobenzaprine (FLEXERIL) 10 MG tablet  Dizziness  Simple chronic bronchitis (HCC)  Sugars well controlled.  Continue current regimen  Resume statin.  Rx will be sent to [facility] department for patient assistance given that she is uninsured  Blood pressure is at  goal upon recheck.  Continue current regimen  Prednisone pack, Flexeril sent.  Okay to continue Tylenol.  Would reduce Motrin to no more than every 6 hours.  Okay to continue gabapentin I offered to increase this but she did not feel that this would be helpful.  I highly recommended that she see specialty for this that she likely needs to have either injection or surgical correction.  I have given her the form for current health patient assistance so that we can get her to some the specialists.  She may benefit from neurologic consult if she has ongoing dizziness.  Unsure if related to the after mentioned  Samples of Anoro provided.  Again we will try and get these medications covered through [facility].  No orders of the defined types were placed in this encounter.  No orders of the defined types were placed in this encounter.    Raliegh Ip, DO [facility] [phone]

## 2020-10-28 NOTE — Patient Instructions (Signed)

## 2020-11-16 ENCOUNTER — Ambulatory Visit (INDEPENDENT_AMBULATORY_CARE_PROVIDER_SITE_OTHER): Payer: Self-pay | Admitting: Nurse Practitioner

## 2020-11-16 DIAGNOSIS — M546 Pain in thoracic spine: Secondary | ICD-10-CM

## 2020-11-16 NOTE — Progress Notes (Signed)
Virtual Visit  Note Due to COVID-19 pandemic this visit was conducted virtually. This visit type was conducted due to national recommendations for restrictions regarding the COVID-19 Pandemic (e.g. social distancing, sheltering in place) in an effort to limit this patient's exposure and mitigate transmission in our community. All issues noted in this document were discussed and addressed.  A physical exam was not performed with this format.  I connected with Nancy Thornton on 11/16/20 at 3:46 by telephone and verified that I am speaking with the correct person using two identifiers. Nancy Thornton is currently located at home and no ne is currently with  her during visit. The provider, Mary-Margaret Daphine Deutscher, FNP is located in their office at time of visit.  I discussed the limitations, risks, security and privacy concerns of performing an evaluation and management service by telephone and the availability of in person appointments. I also discussed with the patient that there may be a patient responsible charge related to this service. The patient expressed understanding and agreed to proceed.   History and Present Illness:   Chief Complaint: Pain   HPI Patient calls in stating that she has a 'pinched Nerve" in her upper back diagnosed by chiropractor. She had an appointment with Dr. Arther Dames and she gave her some steroids and flexeril. That seemed to help but this morning she woke up with left arm hurting and hand  shaking. Hand has burning sensation. She does not know what to do at this point. Patient has no insurance and she is waiting to see if she can get financial help form [employer] to see is she can have treatement including MRI at a reduced rate. She just doe snot know what to do at this point.    Review of Systems  Respiratory: Negative.   Cardiovascular: Negative.   Musculoskeletal: Positive for neck pain. Myalgias: left arm.  Neurological: Positive for tremors (left hand).   Psychiatric/Behavioral: Negative.   All other systems reviewed and are negative.    Observations/Objective: Alert and oriented- answers all questions appropriately No distress Describes left hand shaking   Assessment and Plan: Nancy Thornton in today with chief complaint of Pain   1. Acute left-sided thoracic back pain voltaren Gel OTC Ice Rest Needs to see specialist- she will let Gottschalk kno when needs to see specialist or have MRI.  Patient refuses repeat steroid pack. - needs to be seen if wants pain medication.  The above assessment and management plan was discussed with the patient. The patient verbalized understanding of and has agreed to the management plan. Patient is aware to call the clinic if symptoms persist or worsen. Patient is aware when to return to the clinic for a follow-up visit. Patient educated on when it is appropriate to go to the emergency department.   Mary-Margaret Daphine Deutscher, FNP    Follow Up Instructions: prn    I discussed the assessment and treatment plan with the patient. The patient was provided an opportunity to ask questions and all were answered. The patient agreed with the plan and demonstrated an understanding of the instructions.   The patient was advised to call back or seek an in-person evaluation if the symptoms worsen or if the condition fails to improve as anticipated.  The above assessment and management plan was discussed with the patient. The patient verbalized understanding of and has agreed to the management plan. Patient is aware to call the clinic if symptoms persist or worsen. Patient is aware when to return to  the clinic for a follow-up visit. Patient educated on when it is appropriate to go to the emergency department.   Time call ended:  4:00  I provided 14 minutes of  non face-to-face time during this encounter.    Mary-Margaret Daphine Deutscher, FNP

## 2020-11-18 ENCOUNTER — Encounter: Payer: Self-pay | Admitting: Family Medicine

## 2020-11-18 ENCOUNTER — Ambulatory Visit (INDEPENDENT_AMBULATORY_CARE_PROVIDER_SITE_OTHER): Payer: Self-pay | Admitting: Family Medicine

## 2020-11-18 ENCOUNTER — Other Ambulatory Visit: Payer: Self-pay | Admitting: Family Medicine

## 2020-11-18 ENCOUNTER — Other Ambulatory Visit: Payer: Self-pay

## 2020-11-18 VITALS — BP 131/86 | HR 77 | Temp 98.2°F | Wt 197.0 lb

## 2020-11-18 DIAGNOSIS — E1165 Type 2 diabetes mellitus with hyperglycemia: Secondary | ICD-10-CM

## 2020-11-18 DIAGNOSIS — E1159 Type 2 diabetes mellitus with other circulatory complications: Secondary | ICD-10-CM

## 2020-11-18 DIAGNOSIS — I152 Hypertension secondary to endocrine disorders: Secondary | ICD-10-CM

## 2020-11-18 DIAGNOSIS — M5412 Radiculopathy, cervical region: Secondary | ICD-10-CM

## 2020-11-18 MED ORDER — KETOROLAC TROMETHAMINE 60 MG/2ML IM SOLN
60.0000 mg | Freq: Once | INTRAMUSCULAR | Status: AC
  Administered 2020-11-18: 60 mg via INTRAMUSCULAR

## 2020-11-18 MED ORDER — BETAMETHASONE SOD PHOS & ACET 6 (3-3) MG/ML IJ SUSP
6.0000 mg | Freq: Once | INTRAMUSCULAR | Status: AC
  Administered 2020-11-18: 6 mg via INTRAMUSCULAR

## 2020-11-18 NOTE — Progress Notes (Signed)
Subjective:  Patient ID: Nancy Thornton, female    DOB: 1960-07-14  Age: 60 y.o. MRN: [id number]  CC: Back Pain (UPPER BACK WITH LEFT ARM RADICULOPATHY )   HPI Nancy Thornton presents for pain in upper back for three weeks. Radiating to left arm. Took steroids with temporary improvement. Three days ago came back. Excruciating. Left arm burning, tingling and numb. Wants steroid since it is getting worse.  Doesn't have insurance. Working on [employer] pt. Assistance. Dr. Reece Agar. To refer when that goes through. Neuro  Referral pending.Cannot qualify for [facility] or [facility] [employer].  Depression screen [facility] 2/9 11/18/2020 10/28/2020 06/29/2020  Decreased Interest 0 0 0  Down, Depressed, Hopeless 0 0 0  PHQ - 2 Score 0 0 0  Altered sleeping - - 0  Tired, decreased energy - - 0  Change in appetite - - 0  Feeling bad or failure about yourself  - - 0  Trouble concentrating - - 0  Moving slowly or fidgety/restless - - 0  Suicidal thoughts - - 0  PHQ-9 Score - - 0  Difficult doing work/chores - - -  Some recent data might be hidden    History Nancy Thornton has a past medical history of Anal fissure, Asthma, Depression, Diabetes mellitus without complication (HCC), DM (diabetes mellitus) (HCC) (10/19/2012), Hypertension, Lichen, Obesity, and Substance abuse (HCC).   She has a past surgical history that includes Cholecystectomy; Tonsillectomy; Cystostomy w/ bladder biopsy (1990); Back surgery; Total vaginal hysterectomy; Meniscus repair; Anterior lumbar fusion (N/A, 03/06/2019); Colonoscopy with propofol (N/A, 01/28/2020); and biopsy (01/28/2020).   Her family history includes Diabetes in her mother; Hypertension in her father.She reports that she has been smoking cigarettes. She started smoking about 43 years ago. She has been smoking about 0.50 packs per day. She has never used smokeless tobacco. She reports that she does not drink alcohol and does not use drugs.    ROS Review of Systems  Musculoskeletal:  Positive for back pain and neck pain.  Psychiatric/Behavioral: Positive for sleep disturbance.    Objective:  BP 131/86   Pulse 77   Temp 98.2 F (36.8 C)   Wt 197 lb (89.4 kg)   SpO2 98%   BMI 32.78 kg/m   BP Readings from Last 3 Encounters:  11/18/20 131/86  10/28/20 131/80  06/29/20 119/82    Wt Readings from Last 3 Encounters:  11/18/20 197 lb (89.4 kg)  10/28/20 200 lb (90.7 kg)  06/29/20 204 lb (92.5 kg)     Physical Exam Constitutional:      General: She is in acute distress.     Appearance: Normal appearance.  HENT:     Head: Normocephalic.  Musculoskeletal:        General: Tenderness (at uper rhomboideus on the left) present.     Comments: Painful ROM for neck and left UE   Skin:    General: Skin is warm and dry.  Neurological:     Mental Status: She is alert.  Psychiatric:        Thought Content: Thought content normal.       Assessment & Plan:   Nancy Thornton was seen today for back pain.  Diagnoses and all orders for this visit:  Cervical radicular pain -     betamethasone acetate-betamethasone sodium phosphate (CELESTONE) injection 6 mg -     ketorolac (TORADOL) injection 60 mg       I have discontinued Nancy Thornton's predniSONE. I am also having her maintain her melatonin, Fish Oil,  lisinopril-hydrochlorothiazide, atorvastatin, albuterol, Anoro Ellipta, gabapentin, metFORMIN, atenolol, and cyclobenzaprine. We will continue to administer betamethasone acetate-betamethasone sodium phosphate and ketorolac.  Allergies as of 11/18/2020      Reactions   Penicillins Hives   Did it involve swelling of the face/tongue/throat, SOB, or low BP? No Did it involve sudden or severe rash/hives, skin peeling, or any reaction on the inside of your mouth or nose? Yes Did you need to seek medical attention at a hospital or doctor's office? No When did it last happen?Occurred at age 34 If all above answers are "NO", may proceed with cephalosporin use.    Meloxicam Hypertension   Bactrim [sulfamethoxazole-trimethoprim] Rash      Medication List       Accurate as of Nov 18, 2020 12:30 PM. If you have any questions, ask your nurse or doctor.        STOP taking these medications   predniSONE 10 MG tablet Commonly known as: DELTASONE Stopped by: Mechele Claude, MD     TAKE these medications   albuterol 108 (90 Base) MCG/ACT inhaler Commonly known as: VENTOLIN HFA INHALE 2 PUFFS BY MOUTH EVERY 6 HOURS AS NEEDED FOR WHEEZING AND FOR SHORTNESS OF BREATH   Anoro Ellipta 62.5-25 MCG/INH Aepb Generic drug: umeclidinium-vilanterol Inhale 1 puff into the lungs daily.   atenolol 50 MG tablet Commonly known as: TENORMIN Take 1 tablet by mouth once daily   atorvastatin 10 MG tablet Commonly known as: LIPITOR Take 1 tablet by mouth once daily   cyclobenzaprine 10 MG tablet Commonly known as: FLEXERIL Take 1 tablet (10 mg total) by mouth 3 (three) times daily as needed for muscle spasms.   Fish Oil 1000 MG Caps Take 1,000 mg by mouth daily.   gabapentin 300 MG capsule Commonly known as: NEURONTIN TAKE 1 CAPSULE BY MOUTH THREE TIMES DAILY   lisinopril-hydrochlorothiazide 20-25 MG tablet Commonly known as: ZESTORETIC Take 1 tablet by mouth daily.   melatonin 5 MG Tabs Take 5 mg by mouth at bedtime.   metFORMIN 1000 MG tablet Commonly known as: GLUCOPHAGE TAKE 1 TABLET BY MOUTH TWICE DAILY WITH A MEAL        Follow-up: No follow-ups on file.  Mechele Claude, M.D.

## 2020-11-20 ENCOUNTER — Telehealth: Payer: Self-pay | Admitting: Family Medicine

## 2020-11-20 DIAGNOSIS — M5412 Radiculopathy, cervical region: Secondary | ICD-10-CM

## 2020-11-20 NOTE — Telephone Encounter (Signed)
Dr. Reece Agar, patient is still suffering a lot with her neck pain.  Has had the steroids and 2 injections and it has not lasted and now her left arm is hurting and hands tingling.  She has no insurance and was denied the Patient Assistance program through [place].  Is trying to apply for Medicaid but may take a while, but she really needs to go ahead and establish a relationship with a neurologist to at least get in and see what she needs to have done.  She is in a lot of pain and would appreciate your help in getting her a name of someone she could make an appointment with.  Thank you. Please call.   REFERRAL REQUEST Telephone Note  Have you been seen at our office for this problem? yes (Advise that they may need an appointment with their PCP before a referral can be done)  Reason for Referral:  Neck pain Referral discussed with patient: Dr. Nadine Counts said she could give her a name of a provider to see her about this once she was able to get the "patient assistance" application worked out.  Patient applied for this, but was denied.  Best contact number of patient for referral team: [phone]    Has patient been seen by a specialist for this issue before: no Patient provider preference for referral:  Patient location preference for referral:    Patient notified that referrals can take up to a week or longer to process. If they haven't heard anything within a week they should call back and speak with the referral department.

## 2020-11-21 ENCOUNTER — Emergency Department (HOSPITAL_COMMUNITY)
Admission: EM | Admit: 2020-11-21 | Discharge: 2020-11-21 | Disposition: A | Discharge status: Home / Self Care | Payer: Self-pay | Attending: Emergency Medicine | Admitting: Emergency Medicine

## 2020-11-21 ENCOUNTER — Other Ambulatory Visit: Payer: Self-pay

## 2020-11-21 ENCOUNTER — Encounter (HOSPITAL_COMMUNITY): Payer: Self-pay | Admitting: *Deleted

## 2020-11-21 ENCOUNTER — Emergency Department (HOSPITAL_COMMUNITY): Payer: Self-pay

## 2020-11-21 DIAGNOSIS — F17210 Nicotine dependence, cigarettes, uncomplicated: Secondary | ICD-10-CM | POA: Insufficient documentation

## 2020-11-21 DIAGNOSIS — I10 Essential (primary) hypertension: Secondary | ICD-10-CM | POA: Insufficient documentation

## 2020-11-21 DIAGNOSIS — J45909 Unspecified asthma, uncomplicated: Secondary | ICD-10-CM | POA: Insufficient documentation

## 2020-11-21 DIAGNOSIS — Z79899 Other long term (current) drug therapy: Secondary | ICD-10-CM | POA: Insufficient documentation

## 2020-11-21 DIAGNOSIS — E119 Type 2 diabetes mellitus without complications: Secondary | ICD-10-CM | POA: Insufficient documentation

## 2020-11-21 DIAGNOSIS — Z7984 Long term (current) use of oral hypoglycemic drugs: Secondary | ICD-10-CM | POA: Insufficient documentation

## 2020-11-21 DIAGNOSIS — Z7951 Long term (current) use of inhaled steroids: Secondary | ICD-10-CM | POA: Insufficient documentation

## 2020-11-21 DIAGNOSIS — M5412 Radiculopathy, cervical region: Secondary | ICD-10-CM | POA: Insufficient documentation

## 2020-11-21 MED ORDER — LIDOCAINE 5 % EX PTCH: 1.0000 | MEDICATED_PATCH | CUTANEOUS | 0 refills | Status: DC

## 2020-11-21 MED ORDER — METHOCARBAMOL 500 MG PO TABS: 500.0000 mg | ORAL_TABLET | Freq: Three times a day (TID) | ORAL | 0 refills | Status: DC

## 2020-11-21 MED ORDER — TRAMADOL HCL 50 MG PO TABS: 50.0000 mg | ORAL_TABLET | Freq: Four times a day (QID) | ORAL | 0 refills | Status: DC | PRN

## 2020-11-21 MED ORDER — DIAZEPAM 5 MG PO TABS
5.0000 mg | ORAL_TABLET | Freq: Once | ORAL | Status: AC
  Administered 2020-11-21: 5 mg via ORAL
  Filled 2020-11-21: qty 1

## 2020-11-21 MED ORDER — KETOROLAC TROMETHAMINE 60 MG/2ML IM SOLN
60.0000 mg | Freq: Once | INTRAMUSCULAR | Status: AC
  Administered 2020-11-21: 60 mg via INTRAMUSCULAR
  Filled 2020-11-21: qty 2

## 2020-11-21 NOTE — ED Triage Notes (Signed)
Pt states she has been having arm numbness x one month and states she has been having numbness going to tips of fingers; pt was seen by her PCP and given steroids

## 2020-11-21 NOTE — Discharge Instructions (Addendum)
Take the medication as directed.  Follow-up with your primary care provider for recheck.  Return to the emergency department for any new or worsening symptoms.

## 2020-11-22 NOTE — ED Provider Notes (Signed)
[facility] EMERGENCY DEPARTMENT Provider Note   CSN: [id number] Arrival date & time: 11/21/20  1051     History No chief complaint on file.   Nancy Thornton is a 60 y.o. female.  HPI      Nancy Thornton is a 60 y.o. female with past medical history of diabetes and hypertension who presents to the Emergency Department complaining of numbness and tingling of her left arm and hand for 1 month.  She is also having pain to her neck and upper back.  She states that she has seen her PCP for her symptoms and has been given anti-inflammatory medications and steroids which provide temporary relief, but she has not had any imaging of her neck or back.  She states she currently does not have insurance and has been unable to see a specialist.  She has noticed point tenderness of her upper back along her spine that if she applies pressure to this area her symptoms improved.  Pain of her upper back and neck are worse with movement and improved while lying down.  She is also having tingling of her right index finger and thumb, this is intermittent.  She denies headaches, dizziness, fever or chills, and known injury.    Past Medical History:  Diagnosis Date  . Anal fissure    history of  . Asthma   . Depression   . Diabetes mellitus without complication (HCC)   . DM (diabetes mellitus) (HCC) 10/19/2012  . Hypertension   . Lichen    hands and feet  . Obesity   . 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 Chronic diarrhea 01/09/2020  . Fatty liver 01/09/2020  . Back pain with history of spinal surgery 04/12/2019  . S/P lumbar fusion 03/06/2019  . Tobacco use 07/10/2018  . Obesity 07/10/2018  . 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 Smokes 02/20/2018  . History of arthroscopy of knee 09/26/2017  . Complex tear of medial meniscus of left knee as current injury 08/29/2017  . Acute lateral meniscus tear of left knee  08/15/2017  . Simple chronic bronchitis (HCC) 10/28/2014  . Hypertension associated with diabetes (HCC) 10/19/2012  . Type 2 diabetes mellitus with hyperglycemia, without long-term current use of insulin (HCC) 10/19/2012  . Hyperlipidemia associated with type 2 diabetes mellitus (HCC) 10/19/2012    Past Surgical History:  Procedure Laterality Date  . ANTERIOR LUMBAR FUSION N/A 03/06/2019   Procedure: L4-5 XLIF, L4-5 PSF, LEFT SYNOVIAL CYST RESECTION L4-5;  Surgeon: Venetia Night, MD;  Location: [facility] ORS;  Service: Neurosurgery;  Laterality: N/A;  . BACK SURGERY    . BIOPSY  01/28/2020   Procedure: BIOPSY;  Surgeon: Dolores Frame, MD;  Location: [facility];  Service: Gastroenterology;;  . CHOLECYSTECTOMY    . COLONOSCOPY WITH PROPOFOL N/A 01/28/2020   Procedure: COLONOSCOPY WITH PROPOFOL;  Surgeon: Dolores Frame, MD;  Location: [facility];  Service: Gastroenterology;  Laterality: N/A;  915  . CYSTOSTOMY W/ BLADDER BIOPSY  1990   punctured hole in bladder, had open procedure to repair  . MENISCUS REPAIR    . TONSILLECTOMY    . TOTAL VAGINAL HYSTERECTOMY     60 y/o. bleeding. ovaries left in situ     OB History    Gravida  2   Para  2   Term      Preterm  AB      Living        SAB      IAB      Ectopic      Multiple      Live Births           Obstetric Comments  SVD x 2        Family History  Problem Relation Age of Onset  . Diabetes Mother   . Hypertension Father   . Colon cancer Neg Hx   . Esophageal cancer Neg Hx   . Rectal cancer Neg Hx   . Stomach cancer Neg Hx     Social History   Tobacco Use  . Smoking status: Light Tobacco Smoker    Packs/day: 0.50    Types: Cigarettes    Start date: 10/18/1977    Last attempt to quit: 02/10/2019    Years since quitting: 1.7  . Smokeless tobacco: Never Used  Vaping Use  . Vaping Use: Never used  Substance Use Topics  . Alcohol use: No  . Drug use: No    Home  Medications Prior to Admission medications   Medication Sig Start Date End Date Taking? Authorizing Provider  lidocaine (LIDODERM) 5 % Place 1 patch onto the skin daily. Remove & Discard patch within 12 hours or as directed by MD 11/21/20  Yes Ayat Drenning, PA-C  methocarbamol (ROBAXIN) 500 MG tablet Take 1 tablet (500 mg total) by mouth 3 (three) times daily. 11/21/20  Yes Albaro Deviney, PA-C  traMADol (ULTRAM) 50 MG tablet Take 1 tablet (50 mg total) by mouth every 6 (six) hours as needed. 11/21/20  Yes Janeka Libman, PA-C  albuterol (VENTOLIN HFA) 108 (90 Base) MCG/ACT inhaler INHALE 2 PUFFS BY MOUTH EVERY 6 HOURS AS NEEDED FOR WHEEZING AND FOR SHORTNESS OF BREATH 06/15/20   Delynn Flavin M, DO  atenolol (TENORMIN) 50 MG tablet Take 1 tablet by mouth once daily 10/23/20   Delynn Flavin M, DO  atorvastatin (LIPITOR) 10 MG tablet Take 1 tablet by mouth once daily Patient taking differently: Take 10 mg by mouth daily. 12/17/19   Pricilla Riffle, MD  gabapentin (NEURONTIN) 300 MG capsule TAKE 1 CAPSULE BY MOUTH THREE TIMES DAILY 07/28/20   Delynn Flavin M, DO  lisinopril-hydrochlorothiazide (ZESTORETIC) 20-25 MG tablet Take 1 tablet by mouth once daily 11/18/20   Delynn Flavin M, DO  Melatonin 5 MG TABS Take 5 mg by mouth at bedtime.    [provider]  metFORMIN (GLUCOPHAGE) 1000 MG tablet TAKE 1 TABLET BY MOUTH TWICE DAILY WITH A MEAL 11/18/20   Gottschalk, Kathie Rhodes M, DO  Omega-3 Fatty Acids (FISH OIL) 1000 MG CAPS Take 1,000 mg by mouth daily.    [provider]  umeclidinium-vilanterol (ANORO ELLIPTA) 62.5-25 MCG/INH AEPB Inhale 1 puff into the lungs daily. 06/29/20   Raliegh Ip, DO    Allergies    Penicillins, Meloxicam, and Bactrim [sulfamethoxazole-trimethoprim]  Review of Systems   Review of Systems  Constitutional: Negative for chills, fatigue and fever.  HENT: Negative for trouble swallowing.   Respiratory: Negative for shortness of breath and  wheezing.   Cardiovascular: Negative for chest pain and palpitations.  Gastrointestinal: Negative for abdominal pain, nausea and vomiting.  Genitourinary: Negative for difficulty urinating, dysuria and flank pain.  Musculoskeletal: Positive for back pain and neck pain. Negative for arthralgias, myalgias and neck stiffness.  Skin: Negative for rash.  Neurological: Negative for dizziness, syncope, weakness, numbness (Numbness and tingling of the  left arm and hand.  Intermittent tingling of the right index finger and thumb) and headaches.  Hematological: Does not bruise/bleed easily.    Physical Exam Updated Vital Signs BP (!) 156/88   Pulse 64   Temp 98.4 F (36.9 C) (Oral)   Resp 20   SpO2 97%   Physical Exam Vitals and nursing note reviewed.  Constitutional:      Appearance: Normal appearance. She is not toxic-appearing.  HENT:     Head: Atraumatic.  Neck:     Thyroid: No thyromegaly.     Meningeal: Kernig's sign absent.     Comments: Tender to palpation along the lower cervical paraspinal muscles and midline tenderness of the the lower cervical spine and upper thoracic spine.  No bony step-offs. Cardiovascular:     Rate and Rhythm: Normal rate and regular rhythm.     Pulses: Normal pulses.  Pulmonary:     Effort: Pulmonary effort is normal.     Breath sounds: Normal breath sounds. No wheezing.  Abdominal:     Palpations: Abdomen is soft.     Tenderness: There is no abdominal tenderness. There is no guarding or rebound.  Musculoskeletal:        General: Tenderness present. No swelling or signs of injury. Normal range of motion.     Cervical back: Normal range of motion. Tenderness present. Spinous process tenderness and muscular tenderness present.  Skin:    General: Skin is warm.     Capillary Refill: Capillary refill takes less than 2 seconds.     Findings: No erythema or rash.  Neurological:     General: No focal deficit present.     Mental Status: She is alert and  oriented to person, place, and time.     Sensory: Sensation is intact. No sensory deficit.     Motor: Motor function is intact. No weakness.     Coordination: Coordination is intact.     Deep Tendon Reflexes:     Reflex Scores:      Bicep reflexes are 2+ on the right side and 2+ on the left side.      Brachioradialis reflexes are 2+ on the right side and 2+ on the left side.    Comments: Upper extremity sensation intact.  No motor weakness.     ED Results / Procedures / Treatments   Labs (all labs ordered are listed, but only abnormal results are displayed) Labs Reviewed - No data to display  EKG None  Radiology DG Cervical Spine Complete  Result Date: 11/21/2020 CLINICAL DATA:  Left arm numbness, tingling, pain EXAM: CERVICAL SPINE - COMPLETE 4+ VIEW COMPARISON:  None. FINDINGS: Degenerative disc and facet disease throughout the cervical spine. No significant neural foraminal narrowing. Normal alignment. Prevertebral soft tissues are normal. No fracture. IMPRESSION: Cervical spondylosis.  No acute bony abnormality. Electronically Signed   By: Charlett Nose M.D.   On: 11/21/2020 13:00   DG Thoracic Spine 2 View  Result Date: 11/21/2020 CLINICAL DATA:  Pain, numbness, tingling left arm EXAM: THORACIC SPINE 2 VIEWS COMPARISON:  None. FINDINGS: Degenerative changes throughout the thoracic spine with disc space narrowing and spurring. Normal alignment. No fracture or focal bone lesion. IMPRESSION: No acute bony abnormality. Electronically Signed   By: Charlett Nose M.D.   On: 11/21/2020 13:00    Procedures Procedures   Medications Ordered in ED Medications  ketorolac (TORADOL) injection 60 mg (60 mg Intramuscular Given 11/21/20 1235)  diazepam (VALIUM) tablet 5 mg (5 mg  Oral Given 11/21/20 1235)    ED Course  I have reviewed the triage vital signs and the nursing notes.  Pertinent labs & imaging results that were available during my care of the patient were reviewed by me and  considered in my medical decision making (see chart for details).    MDM Rules/Calculators/A&P                          Patient here with 1 month history of neck and upper back pain with paresthesias of the left arm and hand.  Also paresthesias of the right index finger and thumb of more recent onset.  No known injury.  She is currently been followed by her PCP for her symptoms and been treated with anti-inflammatories and steroids which provide temporary relief.  Doubt emergent process.  Patient has not had any imaging.  Her symptoms are felt to be radicular.  Will obtain C-spine and T-spine films.  On recheck patient has been given medications here for her symptoms and reports some improvement.  X-ray show degenerative changes of the upper spine.  She will likely need follow-up with orthopedics or neurosurgery.  Will provide follow-up information and short course of pain medication.  Given her history of diabetes and recent dosing of nonsteroidals and steroids, I will refrain from additional steroid prescription.  Patient appears appropriate for discharge home and agrees to plan.  Final Clinical Impression(s) / ED Diagnoses Final diagnoses:  Cervical radiculopathy    Rx / DC Orders ED Discharge Orders         Ordered    lidocaine (LIDODERM) 5 %  Every 24 hours        11/21/20 1352    methocarbamol (ROBAXIN) 500 MG tablet  3 times daily        11/21/20 1352    traMADol (ULTRAM) 50 MG tablet  Every 6 hours PRN        11/21/20 1352           Pauline Ausriplett, Sharan Mcenaney, PA-C 11/22/20 1304    Long, Arlyss RepressJoshua G, MD 11/26/20 [phone]

## 2020-11-23 ENCOUNTER — Other Ambulatory Visit: Payer: Self-pay | Admitting: Family Medicine

## 2020-12-08 NOTE — Progress Notes (Signed)
Cardiology Office Note    Date:  12/09/2020   ID:  Nancy Thornton, DOB [birth year], MRN [id number]  PCP:  Raliegh Ip, DO  Cardiologist: Dietrich Pates, MD    F/U of HTN    History of Present Illness:    Nancy Thornton is a 60 y.o. female with past medical history of HTN, HLD, Type II DM former tob use   I saw the pt initially in June 2019 for CP  Pain pleuritic, not felt to be cardiac   She did have atherosclerosis on CT scan and lipitor was recommended   The pt was last seen in cardiology clinic in May 2021 by Jaynie Bream   Today she says she just doesn't feel right  Dizzy in hed  Like something is different    Some dizziness with standing   Has had every day for the past couple months  Denies prior viral infection   Denies syncope    BP at home yesterday was 148/80     Burgess Estelle was a bad spell when she got up from sofa  Drinking 1 bottle fluid and 1 cup coffee per day  Admits she has to drink more   Craves salt  Denies CP with activity       Past Medical History:  Diagnosis Date  . Anal fissure    history of  . Asthma   . Depression   . Diabetes mellitus without complication (HCC)   . DM (diabetes mellitus) (HCC) 10/19/2012  . Hypertension   . Lichen    hands and feet  . Obesity   . 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 ANTERIOR LUMBAR FUSION N/A 03/06/2019   Procedure: L4-5 XLIF, L4-5 PSF, LEFT SYNOVIAL CYST RESECTION L4-5;  Surgeon: Venetia Night, MD;  Location: [facility] ORS;  Service: Neurosurgery;  Laterality: N/A;  . BACK SURGERY    . BIOPSY  01/28/2020   Procedure: BIOPSY;  Surgeon: Dolores Frame, MD;  [facility];  Service: Gastroenterology;;  . CHOLECYSTECTOMY    . COLONOSCOPY WITH PROPOFOL N/A 01/28/2020   Procedure: COLONOSCOPY WITH PROPOFOL;  Surgeon: Dolores Frame, MD;  Location: [facility];  Service: Gastroenterology;  Laterality: N/A;   915  . CYSTOSTOMY W/ BLADDER BIOPSY  1990   punctured hole in bladder, had open procedure to repair  . MENISCUS REPAIR    . TONSILLECTOMY    . TOTAL VAGINAL HYSTERECTOMY     60 y/o. bleeding. ovaries left in situ    Current Medications: Outpatient Medications Prior to Visit  Medication Sig Dispense Refill  . albuterol (VENTOLIN HFA) 108 (90 Base) MCG/ACT inhaler INHALE 2 PUFFS BY MOUTH EVERY 6 HOURS AS NEEDED FOR WHEEZING AND FOR SHORTNESS OF BREATH 9 g 0  . atenolol (TENORMIN) 50 MG tablet Take 1 tablet by mouth once daily 90 tablet 0  . atorvastatin (LIPITOR) 10 MG tablet Take 1 tablet by mouth once daily (Patient taking differently: Take 10 mg by mouth daily.) 90 tablet 3  . gabapentin (NEURONTIN) 300 MG capsule TAKE 1 CAPSULE BY MOUTH THREE TIMES DAILY 90 capsule 2  . lidocaine (LIDODERM) 5 % Place 1 patch onto the skin daily. Remove & Discard patch within 12 hours or as directed by MD 30 patch 0  . lisinopril-hydrochlorothiazide (ZESTORETIC) 20-25 MG tablet Take 1 tablet by mouth once daily 90 tablet 0  .  Melatonin 5 MG TABS Take 5 mg by mouth at bedtime.    . metFORMIN (GLUCOPHAGE) 1000 MG tablet TAKE 1 TABLET BY MOUTH TWICE DAILY WITH A MEAL 180 tablet 0  . methocarbamol (ROBAXIN) 500 MG tablet Take 1 tablet (500 mg total) by mouth 3 (three) times daily. 21 tablet 0  . Omega-3 Fatty Acids (FISH OIL) 1000 MG CAPS Take 1,000 mg by mouth daily.    . traMADol (ULTRAM) 50 MG tablet Take 1 tablet (50 mg total) by mouth every 6 (six) hours as needed. 12 tablet 0  . umeclidinium-vilanterol (ANORO ELLIPTA) 62.5-25 MCG/INH AEPB Inhale 1 puff into the lungs daily. 28 each 0   No facility-administered medications prior to visit.     Allergies:   Penicillins, Meloxicam, and Bactrim [sulfamethoxazole-trimethoprim]   Social History   Socioeconomic History  . Marital status: Divorced    Spouse name: Not on file  . Number of children: 1  . Years of education: Not on file  . Highest  education level: Not on file  Occupational History  . 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  Tobacco Use  . Smoking status: Light Tobacco Smoker    Packs/day: 0.50    Types: Cigarettes    Start date: 10/18/1977    Last attempt to quit: 02/10/2019    Years since quitting: 1.8  . Smokeless tobacco: Never Used  Vaping Use  . Vaping Use: Never used  Substance and Sexual Activity  . Alcohol use: No  . Drug use: No  . Sexual activity: Not Currently  Other Topics Concern  . Not on file  Social History Narrative  . Not on file   Social Determinants of Health   Financial Resource Strain: Not on file  Food Insecurity: Not on file  Transportation Needs: Not on file  Physical Activity: Not on file  Stress: Not on file  Social Connections: Not on file     Family History:  The patient's family history includes Diabetes in her mother; Hypertension in her father.   Review of Systems:   Please see the history of present illness.     All other systems reviewed and are otherwise negative except as noted above.   Physical Exam:    VS:  BP 130/88   Pulse 77   Ht 5' 5.5" (1.664 m)   Wt 192 lb 6.4 oz (87.3 kg)   SpO2 98%   BMI 31.53 kg/m     BP laying 111/69  P 64  Sittin  104/70  P 67   Standing 108/71  P 72  Standing 4 min  109/76 P 75   General: Morbidly obese 60 yo femalein no acute distress. Head: Normocephalic, atraumatic, sclera non-icteric,.  Neck: No carotid bruits. JVP normal  .  Lungs: CTA   Heart: Regular rate and rhythm. No S3 or S4.  No murmur, Abdomen: Soft, non-tender, non-distended with normoactive bowel sounds. No hepatomegaly. No rebound/guarding. No obvious abdominal masses. Msk:  Moving all extrem  No deformities    Extremities: No clubbing or cyanosis. No lower extremity edema.  2+ PT pulses. Neuro: Alert and oriented X 3  No focal deficits noted. Psych:  Responds to questions appropriately with a normal affect.   Wt Readings from Last 3  Encounters:  12/09/20 192 lb 6.4 oz (87.3 kg)  11/18/20 197 lb (89.4 kg)  10/28/20 200 lb (90.7 kg)     Studies/Labs Reviewed:   EKG:  EKG is not ordered today.  Recent Labs: 06/29/2020: ALT 19; BUN 20; Creatinine, Ser 0.73; Potassium 4.5; Sodium 141   Lipid Panel    Component Value Date/Time   CHOL 110 06/29/2020 0914   CHOL 196 01/17/2013 1226   TRIG 167 (H) 06/29/2020 0914   TRIG 233 (H) 09/05/2013 1142   TRIG 176 (H) 01/17/2013 1226   HDL 42 06/29/2020 0914   HDL 58 09/05/2013 1142   HDL 57 01/17/2013 1226   CHOLHDL 2.6 06/29/2020 0914   LDLCALC 40 06/29/2020 0914   LDLCALC 58 09/05/2013 1142   LDLCALC 104 (H) 01/17/2013 1226   LDLDIRECT 54 02/16/2018 0903    Additional studies/ records that were reviewed today include:    Plan:   1  Dizziness   This is the patient's biggest complaint today   She is not orthostatic on exam    It sounds like she does not drink that much fluid   I have encouraged her to really push this   Will check UA today    Given dizzines  I will not change her meds    Encouraged her to stay active  2 HTN BP is fair   Follow   No changes given dizziness  3. Chest pain  Pt denies any signif pain  Follow    4  Aortic Atherosclerosis - Noted on prior CT imaging in 05/2016. She is currently on statin therapy.  5  Lipids   LDL 40  HDL 42   In December 2021   Continue lipitor       Your physician recommends that you continue on your current medications as directed. Please refer to the Current Medication list given to you today.  If you need a refill on your cardiac medications before your next appointment, please call your pharmacy.  No labs or tests today  Thank you for choosing [facility] !        Signed, Dietrich Pates, MD  12/09/2020 10:30 AM    [facility] [address] [place], [place] [address] Phone: [phone]

## 2020-12-09 ENCOUNTER — Encounter: Payer: Self-pay | Admitting: Internal Medicine

## 2020-12-09 ENCOUNTER — Other Ambulatory Visit: Payer: Self-pay

## 2020-12-09 ENCOUNTER — Ambulatory Visit (INDEPENDENT_AMBULATORY_CARE_PROVIDER_SITE_OTHER): Payer: Self-pay | Admitting: Internal Medicine

## 2020-12-09 VITALS — BP 130/88 | HR 77 | Ht 65.5 in | Wt 192.4 lb

## 2020-12-09 DIAGNOSIS — R42 Dizziness and giddiness: Secondary | ICD-10-CM

## 2020-12-09 NOTE — Patient Instructions (Signed)
Medication Instructions:  Your physician recommends that you continue on your current medications as directed. Please refer to the Current Medication list given to you today.  *If you need a refill on your cardiac medications before your next appointment, please call your pharmacy*   Lab Work: NONE   If you have labs (blood work) drawn today and your tests are completely normal, you will receive your results only by: . MyChart Message (if you have MyChart) OR . A paper copy in the mail If you have any lab test that is abnormal or we need to change your treatment, we will call you to review the results.   Testing/Procedures: NONE    Follow-Up: At [facility], you and your health needs are our priority.  As part of our continuing mission to provide you with exceptional heart care, we have created designated Provider Care Teams.  These Care Teams include your primary Cardiologist (physician) and Advanced Practice Providers (APPs -  Physician Assistants and Nurse Practitioners) who all work together to provide you with the care you need, when you need it.  We recommend signing up for the patient portal called "MyChart".  Sign up information is provided on this After Visit Summary.  MyChart is used to connect with patients for Virtual Visits (Telemedicine).  Patients are able to view lab/test results, encounter notes, upcoming appointments, etc.  Non-urgent messages can be sent to your provider as well.   To learn more about what you can do with MyChart, go to [email or web address].    Your next appointment:   8 month(s)  The format for your next appointment:   In Person  Provider:   Paula Ross, MD   Other Instructions Thank you for choosing [facility]!    

## 2021-03-02 ENCOUNTER — Ambulatory Visit: Payer: Self-pay | Admitting: Family Medicine

## 2022-01-17 NOTE — Congregational Nurse Program (Signed)
Client [facility] Didn't discuss any concern about food or housing utilities and no safety or behavior needs or even needing transportation assistance.

## 2022-02-18 ENCOUNTER — Ambulatory Visit: Payer: Medicare Other | Admitting: Family Medicine

## 2022-03-15 ENCOUNTER — Ambulatory Visit (INDEPENDENT_AMBULATORY_CARE_PROVIDER_SITE_OTHER): Payer: Medicare Other | Admitting: Family Medicine

## 2022-03-15 ENCOUNTER — Encounter: Payer: Self-pay | Admitting: Family Medicine

## 2022-03-15 VITALS — BP 152/93 | HR 72 | Temp 97.7°F | Ht 65.5 in | Wt 152.4 lb

## 2022-03-15 DIAGNOSIS — R6881 Early satiety: Secondary | ICD-10-CM | POA: Diagnosis not present

## 2022-03-15 DIAGNOSIS — R634 Abnormal weight loss: Secondary | ICD-10-CM | POA: Diagnosis not present

## 2022-03-15 DIAGNOSIS — J410 Simple chronic bronchitis: Secondary | ICD-10-CM

## 2022-03-15 DIAGNOSIS — Z8249 Family history of ischemic heart disease and other diseases of the circulatory system: Secondary | ICD-10-CM

## 2022-03-15 DIAGNOSIS — E1159 Type 2 diabetes mellitus with other circulatory complications: Secondary | ICD-10-CM | POA: Diagnosis not present

## 2022-03-15 DIAGNOSIS — R42 Dizziness and giddiness: Secondary | ICD-10-CM

## 2022-03-15 DIAGNOSIS — E1165 Type 2 diabetes mellitus with hyperglycemia: Secondary | ICD-10-CM | POA: Diagnosis not present

## 2022-03-15 DIAGNOSIS — Z720 Tobacco use: Secondary | ICD-10-CM | POA: Diagnosis not present

## 2022-03-15 DIAGNOSIS — E785 Hyperlipidemia, unspecified: Secondary | ICD-10-CM | POA: Diagnosis not present

## 2022-03-15 DIAGNOSIS — R3915 Urgency of urination: Secondary | ICD-10-CM | POA: Diagnosis not present

## 2022-03-15 DIAGNOSIS — I152 Hypertension secondary to endocrine disorders: Secondary | ICD-10-CM

## 2022-03-15 DIAGNOSIS — E1169 Type 2 diabetes mellitus with other specified complication: Secondary | ICD-10-CM

## 2022-03-15 LAB — MICROSCOPIC EXAMINATION
Bacteria, UA: NONE SEEN
Epithelial Cells (non renal): NONE SEEN /hpf (ref 0–10)
RBC, Urine: NONE SEEN /hpf (ref 0–2)
Renal Epithel, UA: NONE SEEN /hpf
WBC, UA: NONE SEEN /hpf (ref 0–5)

## 2022-03-15 LAB — URINALYSIS, ROUTINE W REFLEX MICROSCOPIC
Bilirubin, UA: NEGATIVE
Glucose, UA: NEGATIVE
Leukocytes,UA: NEGATIVE
Nitrite, UA: NEGATIVE
RBC, UA: NEGATIVE
Specific Gravity, UA: 1.02 (ref 1.005–1.030)
Urobilinogen, Ur: 1 mg/dL (ref 0.2–1.0)
pH, UA: 5.5 (ref 5.0–7.5)

## 2022-03-15 LAB — BAYER DCA HB A1C WAIVED: HB A1C (BAYER DCA - WAIVED): 5.7 % — ABNORMAL HIGH (ref 4.8–5.6)

## 2022-03-15 MED ORDER — CYCLOBENZAPRINE HCL 10 MG PO TABS: 10.0000 mg | ORAL_TABLET | Freq: Three times a day (TID) | ORAL | 1 refills | Status: DC | PRN

## 2022-03-15 MED ORDER — GABAPENTIN 300 MG PO CAPS: 300.0000 mg | ORAL_CAPSULE | Freq: Two times a day (BID) | ORAL | 3 refills | Status: DC

## 2022-03-15 MED ORDER — ATORVASTATIN CALCIUM 20 MG PO TABS: 20.0000 mg | ORAL_TABLET | Freq: Every day | ORAL | 3 refills | Status: DC

## 2022-03-15 MED ORDER — ATENOLOL 50 MG PO TABS: 50.0000 mg | ORAL_TABLET | Freq: Every day | ORAL | 3 refills | Status: DC

## 2022-03-15 MED ORDER — LISINOPRIL 10 MG PO TABS: 10.0000 mg | ORAL_TABLET | Freq: Every day | ORAL | 3 refills | Status: DC

## 2022-03-15 NOTE — Patient Instructions (Signed)
Get mammogram scheduled at check out CT scan for lung cancer screen ordered CT scan of heart vessels ordered Referral to gastric Dr in [place] replaced Lisinopril added back for blood pressure Come back in next week for BP check and REPEAT LABS Make diabetic eye appointment ASAP STOP metformin

## 2022-03-15 NOTE — Progress Notes (Signed)
Subjective: CC:DM PCP: [name], DO RFF:[id number] Nancy Thornton is a 61 y.o. female presenting to clinic today for:  1. Type 2 Diabetes with hypertension, hyperlipidemia:  Compliant with metformin 500 mg daily, Lipitor 20 mg daily and atenolol 50 mg daily.  She was taken off of her Zestoretic.  She cannot tolerate the diuretic and this.  Last eye exam: needs Last foot exam: needs Last A1c:  Lab Results  Component Value Date   HGBA1C 6.4 10/28/2020   Nephropathy screen indicated?: UTD Last flu, zoster and/or pneumovax:  Immunization History  Administered Date(s) Administered   Influenza, High Dose Seasonal PF 03/30/2018   Influenza, Quadrivalent, Recombinant, Inj, Pf 03/20/2019   Influenza, Seasonal, Injecte, Preservative Fre 04/18/2013, 04/12/2014, 03/12/2015, 04/08/2017   Influenza,inj,Quad PF,6+ Mos 04/18/2013, 03/12/2015, 04/08/2017, 03/30/2018   Influenza-Unspecified 04/12/2014, 03/20/2019, 04/25/2020   Moderna Sars-Covid-2 Vaccination 09/20/2019, 10/22/2019, 02/24/2020, 04/25/2020   Pneumococcal Conjugate-13 09/05/2013   Pneumococcal Polysaccharide-23 03/12/2015   Td 01/09/2017   Tdap 01/09/2017    ROS: No chest pain reported but her brother recently died of a massive MI at age 32 and she would like to have a coronary artery calcium score done by a CAT scan.  She continues to be an active smoker and has been a smoker since 61 years old at 1 pack/day and only recently dropped a half a pack per day over the last 3 years.  No hemoptysis.  She does report ongoing weight loss but states that this has been since she started metformin/was diagnosed with diabetes.  Occasionally does have dizziness and this has been a chronic issue for years.  She does not regularly check her blood pressure or blood sugar during these episodes but has been worked up previously by cardiology.  She is on Anoro for her COPD and fears that she will not be able to afford this going forward.  She has  several months of medication from the health department at this time.  Denies any blood in stool.  Had colonoscopy performed in 2021 and is not expected to follow-up until 2031.   ROS: Per HPI  Allergies  Allergen Reactions   Penicillins Hives    Did it involve swelling of the face/tongue/throat, SOB, or low BP? No Did it involve sudden or severe rash/hives, skin peeling, or any reaction on the inside of your mouth or nose? Yes Did you need to seek medical attention at a hospital or doctor's office? No When did it last happen? Occurred at age 4  If all above answers are "NO", may proceed with cephalosporin use.    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    Current Outpatient Medications:    albuterol (VENTOLIN HFA) 108 (90 Base) MCG/ACT inhaler, INHALE 2 PUFFS BY MOUTH EVERY 6 HOURS AS NEEDED FOR WHEEZING AND FOR SHORTNESS OF BREATH, Disp: 9 g, Rfl: 0   atenolol (TENORMIN) 50 MG tablet, Take 1 tablet by mouth once daily, Disp: 90 tablet, Rfl: 0   atorvastatin (LIPITOR) 10 MG tablet, Take 1 tablet by mouth once daily (Patient taking differently: Take  10 mg by mouth daily.), Disp: 90 tablet, Rfl: 3   gabapentin (NEURONTIN) 300 MG capsule, TAKE 1 CAPSULE BY MOUTH THREE TIMES DAILY, Disp: 90 capsule, Rfl: 2   lidocaine (LIDODERM) 5 %, Place 1 patch onto the skin daily. Remove & Discard patch within 12 hours or as directed by MD, Disp: 30 patch, Rfl: 0   lisinopril-hydrochlorothiazide (ZESTORETIC) 20-25 MG tablet, Take 1 tablet by mouth once daily, Disp: 90 tablet, Rfl: 0   Melatonin 5 MG TABS, Take 5 mg by mouth at bedtime., Disp: , Rfl:    metFORMIN  (GLUCOPHAGE) 1000 MG tablet, TAKE 1 TABLET BY MOUTH TWICE DAILY WITH A MEAL, Disp: 180 tablet, Rfl: 0   Omega-3 Fatty Acids (FISH OIL) 1000 MG CAPS, Take 1,000 mg by mouth daily., Disp: , Rfl:    umeclidinium-vilanterol (ANORO ELLIPTA) 62.5-25 MCG/INH AEPB, Inhale 1 puff into the lungs daily., Disp: 28 each, Rfl: 0 Social History   Socioeconomic History   Marital status: Divorced    Spouse name: Not on file   Number of children: 1   Years of education: Not on file   Highest education level: Not on file  Occupational History   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  Tobacco Use   Smoking status: Light Smoker    Packs/day: 0.50    Types: Cigarettes    Start date: 10/18/1977    Last attempt to quit: 02/10/2019    Years since quitting: 3.0   Smokeless tobacco: Never  Vaping Use   Vaping Use: Never used  Substance and Sexual Activity   Alcohol use: No   Drug use: No   Sexual activity: Not Currently  Other Topics Concern   Not on file  Social History Narrative   Not on file   Social Determinants of Health   Financial Resource Strain: Not on file  Food Insecurity: Not on file  Transportation Needs: Not on file  Physical Activity: Not on file  Stress: Not on file  Social Connections: Not on file  Intimate Partner Violence: Not on file   Family History  Problem Relation Age of Onset   Diabetes Mother    Hypertension Father    Colon cancer Neg Hx    Esophageal cancer Neg Hx    Rectal cancer Neg Hx    Stomach cancer Neg Hx     Objective: Office vital signs reviewed. BP (!) 152/93   Pulse 72   Temp 97.7 F (36.5 C) (Temporal)   Ht 5' 5.5" (1.664 m)   Wt 152 lb 6.4 oz (69.1 kg)   SpO2 96%   BMI 24.97 kg/m   Physical Examination:  General: Awake, alert, nontoxic female, No acute distress HEENT: Sclera white.  Smells of tobacco.  Leukoplakia appreciated orally Cardio: regular rate and rhythm, S1S2 heard, no murmurs appreciated Pulm: clear to auscultation  bilaterally, no wheezes, rhonchi or rales; normal work of breathing on room air Extremities: warm, well perfused, No edema, cyanosis or clubbing; +2 pulses bilaterally MSK: Normal tone with independent ambulation Neuro: No tremor Diabetic Foot Exam - Simple   Simple Foot Form Diabetic Foot exam was performed with the following findings: Yes 03/15/2022 12:32 PM  Visual Inspection No deformities, no ulcerations, no other skin breakdown bilaterally: Yes Sensation Testing Intact to touch and monofilament testing bilaterally: Yes Pulse Check Posterior Tibialis and Dorsalis pulse intact bilaterally: Yes Comments      Assessment/ Plan: 61 y.o. female   Type 2 diabetes mellitus with hyperglycemia,  without long-term current use of insulin ([place]) - Plan: Bayer DCA Hb A1c Waived, CT CARDIAC SCORING (SELF PAY ONLY), AMB Referral to Chronic Care Management Services  Hyperlipidemia associated with type 2 diabetes mellitus ([place]) - Plan: CMP14+EGFR, Lipid Panel, TSH, atorvastatin (LIPITOR) 20 MG tablet, CT CARDIAC SCORING (SELF PAY ONLY), AMB Referral to Chronic Care Management Services  Hypertension associated with diabetes ([place]) - Plan: CMP14+EGFR, atenolol (TENORMIN) 50 MG tablet, lisinopril (ZESTRIL) 10 MG tablet, Basic metabolic panel, CT CARDIAC SCORING (SELF PAY ONLY), AMB Referral to Chronic Care Management Services  Unexplained weight loss - Plan: CBC with Differential, Ambulatory referral to Gastroenterology  Tobacco use - Plan: CT CHEST LUNG CA SCREEN LOW DOSE W/O CM, AMB Referral to Chronic Care Management Services  Family history of early CAD - Plan: CT CARDIAC SCORING (SELF PAY ONLY), AMB Referral to Chronic Care Management Services  Early satiety - Plan: Ambulatory referral to Gastroenterology  Dizziness  Urinary urgency - Plan: Urinalysis, Routine w reflex microscopic, Urine Culture  Simple chronic bronchitis ([place]) - Plan: AMB Referral to Chronic Care Management Services  Sugar  is under excellent control.  Okay to discontinue metformin.  Continue statin given known hyperlipidemia and family history of cardiovascular disease.  Blood pressure is not controlled.  Add lisinopril 10 mg daily and I would like her to repeat BMP, blood pressure check in 1 week.  I have also added a CBC given unplanned weight loss though again this seems to be triggered by use of metformin and change in dietary habits related to diabetic diagnosis.  I have referred her to gastroenterology because of the early satiety just to rule out any other malignant processes since she is an ongoing smoker and her risk for cancers is elevated as a result  Coronary artery calcium score ordered given brother's recent massive MI which resulted in his death.  She understands that this is a self-pay exam.  We will plan to referral to Dr. Domenic Polite in [place] if needed  Uncertain etiology of the dizziness but I have encouraged her to check her blood sugar and blood pressure during these episodes.  Further work-up pending these findings  Urinalysis with urine culture ordered given reports of urinary urgency.  Referral to CCM for med assistance.  I anticipate she will be needing a triple inhaler.  No orders of the defined types were placed in this encounter.  No orders of the defined types were placed in this encounter.    [name], DO [place] [phone]

## 2022-03-16 ENCOUNTER — Encounter (INDEPENDENT_AMBULATORY_CARE_PROVIDER_SITE_OTHER): Payer: Self-pay | Admitting: *Deleted

## 2022-03-16 LAB — CMP14+EGFR
ALT: 23 IU/L (ref 0–32)
AST: 21 IU/L (ref 0–40)
Albumin/Globulin Ratio: 2.9 — ABNORMAL HIGH (ref 1.2–2.2)
Albumin: 4.7 g/dL (ref 3.8–4.9)
Alkaline Phosphatase: 66 IU/L (ref 44–121)
BUN/Creatinine Ratio: 14 (ref 12–28)
BUN: 13 mg/dL (ref 8–27)
Bilirubin Total: 1.1 mg/dL (ref 0.0–1.2)
CO2: 27 mmol/L (ref 20–29)
Calcium: 9.9 mg/dL (ref 8.7–10.3)
Chloride: 99 mmol/L (ref 96–106)
Creatinine, Ser: 0.96 mg/dL (ref 0.57–1.00)
Globulin, Total: 1.6 g/dL (ref 1.5–4.5)
Glucose: 156 mg/dL — ABNORMAL HIGH (ref 70–99)
Potassium: 5 mmol/L (ref 3.5–5.2)
Sodium: 139 mmol/L (ref 134–144)
Total Protein: 6.3 g/dL (ref 6.0–8.5)
eGFR: 68 mL/min/{1.73_m2} (ref >59)

## 2022-03-16 LAB — TSH: TSH: 2.28 u[IU]/mL (ref 0.450–4.500)

## 2022-03-16 LAB — LIPID PANEL
Chol/HDL Ratio: 2.6 ratio (ref 0.0–4.4)
Cholesterol, Total: 160 mg/dL (ref 100–199)
HDL: 62 mg/dL (ref >39)
LDL Chol Calc (NIH): 78 mg/dL (ref 0–99)
Triglycerides: 112 mg/dL (ref 0–149)
VLDL Cholesterol Cal: 20 mg/dL (ref 5–40)

## 2022-03-17 LAB — URINE CULTURE

## 2022-03-21 ENCOUNTER — Other Ambulatory Visit: Payer: Self-pay | Admitting: Family Medicine

## 2022-03-21 DIAGNOSIS — Z1231 Encounter for screening mammogram for malignant neoplasm of breast: Secondary | ICD-10-CM

## 2022-03-23 ENCOUNTER — Ambulatory Visit
Admission: RE | Admit: 2022-03-23 | Discharge: 2022-03-23 | Disposition: A | Discharge status: Home / Self Care | Payer: Medicare Other | Source: Ambulatory Visit | Attending: Family Medicine | Admitting: Family Medicine

## 2022-03-23 DIAGNOSIS — Z1231 Encounter for screening mammogram for malignant neoplasm of breast: Secondary | ICD-10-CM

## 2022-03-25 ENCOUNTER — Telehealth: Payer: Self-pay

## 2022-03-25 NOTE — Chronic Care Management (AMB) (Signed)
  Chronic Care Management   Note  03/25/2022 Name: Nancy Thornton MRN: [id number] DOB: 08-18-1960  Nancy Thornton is a 61 y.o. year old female who is a primary care patient of [name], DO. I reached out to Percell Belt by phone today in response to a referral sent by [name].  [name] was given information about Chronic Care Management services today including:  CCM service includes personalized support from designated clinical staff supervised by her physician, including individualized plan of care and coordination with other care providers 24/7 contact phone numbers for assistance for urgent and routine care needs. Service will only be billed when office clinical staff spend 20 minutes or more in a month to coordinate care. Only one practitioner may furnish and bill the service in a calendar month. The patient may stop CCM services at any time (effective at the end of the month) by phone call to the office staff. The patient is responsible for co-pay (up to 20% after annual deductible is met) if co-pay is required by the individual health plan.   Patient agreed to services and verbal consent obtained.   Follow up plan: Telephone appointment with care management team member scheduled for:05/06/2022  Noreene Larsson, [place],  [address] Direct Dial: [phone] [email or web address]

## 2022-03-28 ENCOUNTER — Telehealth: Payer: Self-pay

## 2022-03-28 NOTE — Telephone Encounter (Signed)
Pt come in to get bp checked this morning- 132/81 with a pulse of 67

## 2022-03-29 NOTE — Telephone Encounter (Signed)
Patient aware and verbalizes understanding. 

## 2022-03-29 NOTE — Telephone Encounter (Signed)
Perfect reading 

## 2022-04-06 DIAGNOSIS — H2513 Age-related nuclear cataract, bilateral: Secondary | ICD-10-CM | POA: Diagnosis not present

## 2022-04-06 DIAGNOSIS — E119 Type 2 diabetes mellitus without complications: Secondary | ICD-10-CM | POA: Diagnosis not present

## 2022-04-06 LAB — HM DIABETES EYE EXAM

## 2022-04-12 ENCOUNTER — Telehealth: Payer: Self-pay

## 2022-04-12 DIAGNOSIS — I152 Hypertension secondary to endocrine disorders: Secondary | ICD-10-CM

## 2022-04-12 DIAGNOSIS — E1159 Type 2 diabetes mellitus with other circulatory complications: Secondary | ICD-10-CM

## 2022-04-12 MED ORDER — LISINOPRIL 20 MG PO TABS: 20.0000 mg | ORAL_TABLET | Freq: Every day | ORAL | 3 refills | Status: DC

## 2022-04-12 NOTE — Telephone Encounter (Signed)
Patient aware.

## 2022-04-12 NOTE — Telephone Encounter (Signed)
Sounds like her BP was better controlled on 20mg .  Ok to double the 10mg  until done with current supply. New rx for 20mg  sent to pharmacy.  Make sure she carefully organizes meds so duplication occurs going forward.  Yuleimy Kretz M. Lajuana Ripple, [facility]

## 2022-04-12 NOTE — Telephone Encounter (Signed)
Patient came in to discuss blood pressure medication with triage nurse.  Patient is prescribed Lisinopril 10 mg daily.  She came in for a blood pressure check on 03/28/22 and BP was 132/81.  Patient recently realized that she has been taking two of her blood pressure pills, so she has been taking 20 mg Lisinopril and was at the time of her blood pressure check.  In the last couple of weeks she has been having her blood pressure checked at [address] in [place] and it has been somewhere around 150/90.  Blood pressure was checked today in office at 150/92.  Patient is concerned about elevation and would like to know what you recommend be done.  [facility]

## 2022-04-21 ENCOUNTER — Ambulatory Visit: Payer: Medicare Other

## 2022-04-21 ENCOUNTER — Ambulatory Visit (HOSPITAL_COMMUNITY)
Admission: RE | Admit: 2022-04-21 | Discharge: 2022-04-21 | Disposition: A | Discharge status: Home / Self Care | Payer: Medicare Other | Source: Ambulatory Visit | Attending: Family Medicine | Admitting: Family Medicine

## 2022-04-21 ENCOUNTER — Other Ambulatory Visit: Payer: Medicare Other

## 2022-04-21 DIAGNOSIS — E1165 Type 2 diabetes mellitus with hyperglycemia: Secondary | ICD-10-CM | POA: Insufficient documentation

## 2022-04-21 DIAGNOSIS — I2510 Atherosclerotic heart disease of native coronary artery without angina pectoris: Secondary | ICD-10-CM | POA: Insufficient documentation

## 2022-04-21 DIAGNOSIS — I700 Atherosclerosis of aorta: Secondary | ICD-10-CM | POA: Insufficient documentation

## 2022-04-21 DIAGNOSIS — F17210 Nicotine dependence, cigarettes, uncomplicated: Secondary | ICD-10-CM | POA: Insufficient documentation

## 2022-04-21 DIAGNOSIS — I152 Hypertension secondary to endocrine disorders: Secondary | ICD-10-CM | POA: Diagnosis not present

## 2022-04-21 DIAGNOSIS — J439 Emphysema, unspecified: Secondary | ICD-10-CM | POA: Diagnosis not present

## 2022-04-21 DIAGNOSIS — E1159 Type 2 diabetes mellitus with other circulatory complications: Secondary | ICD-10-CM | POA: Insufficient documentation

## 2022-04-21 DIAGNOSIS — R634 Abnormal weight loss: Secondary | ICD-10-CM

## 2022-04-21 DIAGNOSIS — Z720 Tobacco use: Secondary | ICD-10-CM

## 2022-04-21 DIAGNOSIS — E1169 Type 2 diabetes mellitus with other specified complication: Secondary | ICD-10-CM | POA: Insufficient documentation

## 2022-04-21 DIAGNOSIS — Z122 Encounter for screening for malignant neoplasm of respiratory organs: Secondary | ICD-10-CM | POA: Diagnosis not present

## 2022-04-21 DIAGNOSIS — Z0130 Encounter for examination of blood pressure without abnormal findings: Secondary | ICD-10-CM

## 2022-04-21 DIAGNOSIS — E785 Hyperlipidemia, unspecified: Secondary | ICD-10-CM | POA: Insufficient documentation

## 2022-04-21 DIAGNOSIS — Z8249 Family history of ischemic heart disease and other diseases of the circulatory system: Secondary | ICD-10-CM | POA: Insufficient documentation

## 2022-04-21 NOTE — Progress Notes (Signed)
Patient here today for blood pressure check Blood pressure is 151/92, pulse 70 Blood pressure rechecked at 155/96, pulse 73

## 2022-04-22 ENCOUNTER — Other Ambulatory Visit: Payer: Self-pay | Admitting: Family Medicine

## 2022-04-22 DIAGNOSIS — R931 Abnormal findings on diagnostic imaging of heart and coronary circulation: Secondary | ICD-10-CM

## 2022-04-22 DIAGNOSIS — Z8249 Family history of ischemic heart disease and other diseases of the circulatory system: Secondary | ICD-10-CM

## 2022-04-22 DIAGNOSIS — E1159 Type 2 diabetes mellitus with other circulatory complications: Secondary | ICD-10-CM

## 2022-04-22 DIAGNOSIS — Z720 Tobacco use: Secondary | ICD-10-CM

## 2022-04-22 LAB — CBC WITH DIFFERENTIAL/PLATELET
Basophils Absolute: 0.1 10*3/uL (ref 0.0–0.2)
Basos: 1 %
EOS (ABSOLUTE): 0.1 10*3/uL (ref 0.0–0.4)
Eos: 1 %
Hematocrit: 43.4 % (ref 34.0–46.6)
Hemoglobin: 15.1 g/dL (ref 11.1–15.9)
Immature Grans (Abs): 0 10*3/uL (ref 0.0–0.1)
Immature Granulocytes: 0 %
Lymphocytes Absolute: 1.8 10*3/uL (ref 0.7–3.1)
Lymphs: 21 %
MCH: 31.7 pg (ref 26.6–33.0)
MCHC: 34.8 g/dL (ref 31.5–35.7)
MCV: 91 fL (ref 79–97)
Monocytes Absolute: 0.5 10*3/uL (ref 0.1–0.9)
Monocytes: 6 %
Neutrophils Absolute: 6.2 10*3/uL (ref 1.4–7.0)
Neutrophils: 71 %
Platelets: 248 10*3/uL (ref 150–450)
RBC: 4.76 x10E6/uL (ref 3.77–5.28)
RDW: 12.3 % (ref 11.7–15.4)
WBC: 8.7 10*3/uL (ref 3.4–10.8)

## 2022-04-22 LAB — BASIC METABOLIC PANEL
BUN/Creatinine Ratio: 17 (ref 12–28)
BUN: 15 mg/dL (ref 8–27)
CO2: 23 mmol/L (ref 20–29)
Calcium: 9.7 mg/dL (ref 8.7–10.3)
Chloride: 101 mmol/L (ref 96–106)
Creatinine, Ser: 0.89 mg/dL (ref 0.57–1.00)
Glucose: 128 mg/dL — ABNORMAL HIGH (ref 70–99)
Potassium: 4.6 mmol/L (ref 3.5–5.2)
Sodium: 142 mmol/L (ref 134–144)
eGFR: 74 mL/min/{1.73_m2} (ref >59)

## 2022-04-25 ENCOUNTER — Telehealth: Payer: Self-pay

## 2022-04-25 NOTE — Telephone Encounter (Signed)
Lm making pt aware

## 2022-04-25 NOTE — Telephone Encounter (Signed)
Please inform pt that the [facility] office is trying to reach her.

## 2022-04-25 NOTE — Telephone Encounter (Signed)
Dr Lajuana Ripple placed referral for cardiologist.  Patient received phone call from Dr. Harrington Challenger' office and does not want to go there.  Wants to see Dr. Percival Spanish in [place].

## 2022-04-26 ENCOUNTER — Telehealth: Payer: Self-pay | Admitting: Internal Medicine

## 2022-04-26 NOTE — Telephone Encounter (Signed)
Pt would like a provider switch from Dr. Harrington Challenger to Dr. Percival Spanish.

## 2022-04-26 NOTE — Telephone Encounter (Signed)
Called pt to inform her of Dr. Rosezella [place] response, no answer, LVMTCB

## 2022-04-28 ENCOUNTER — Telehealth: Payer: Self-pay | Admitting: *Deleted

## 2022-04-28 NOTE — Telephone Encounter (Signed)
  Care Management   Follow Up Note   04/28/2022 Name: Nancy Thornton MRN: [id number] DOB: 11/20/1960   Referred by: [name], DO Reason for referral : Chronic Care Management   An unsuccessful telephone outreach was attempted today. The patient was referred to the case management team for assistance with care management and care coordination.   Follow Up Plan: Telephone follow up appointment with care management team member scheduled for: upon care guide rescheduling.  Jacqlyn Larsen RNC, BSN RN Case Manager [place] [phone]

## 2022-05-06 ENCOUNTER — Ambulatory Visit (INDEPENDENT_AMBULATORY_CARE_PROVIDER_SITE_OTHER): Payer: Medicare Other | Admitting: Pharmacist

## 2022-05-06 DIAGNOSIS — E1159 Type 2 diabetes mellitus with other circulatory complications: Secondary | ICD-10-CM | POA: Diagnosis not present

## 2022-05-06 DIAGNOSIS — E1165 Type 2 diabetes mellitus with hyperglycemia: Secondary | ICD-10-CM

## 2022-05-06 DIAGNOSIS — I152 Hypertension secondary to endocrine disorders: Secondary | ICD-10-CM | POA: Diagnosis not present

## 2022-05-06 DIAGNOSIS — Z720 Tobacco use: Secondary | ICD-10-CM | POA: Diagnosis not present

## 2022-05-06 MED ORDER — AMLODIPINE BESYLATE 5 MG PO TABS: 5.0000 mg | ORAL_TABLET | Freq: Every day | ORAL | 3 refills | Status: DC

## 2022-05-06 NOTE — Progress Notes (Signed)
Chronic Care Management Pharmacy Note  05/06/2022 Name:  Erleen Egner MRN:  [id number] DOB:  1961-04-18  Summary:  Chronic Obstructive Pulmonary Disease:  New goal. Uncontrolled; current treatment:ANORO;  Will plan to transition patient to [facility] for triple therapy Patient declined cessation--will continue to explore Diabetes controlled, but will continue to address 43 pack year history (smokes 1ppd since age 61) [place] quit line info given Recommended start Breztri Assessed patient finances. Will file application for Breztri triple inhaler for [place] BP controlled upon recheck x3   Patient Goals/Self-Care Activities patient will:  - take medications as prescribed as evidenced by patient report and record review collaborate with provider on medication access solutions   Subjective: Aviannah Castoro is an 61 y.o. year old female who is a primary patient of [name], DO.  The patient was referred to the Chronic Care Management team for assistance with care management needs subsequent to provider initiation of CCM services and plan of care.    Engaged with patient face to face for initial visit in response to provider referral for CCM services.   Objective:  LABS:    Lab Results  Component Value Date   CREATININE 0.89 04/21/2022   CREATININE 0.96 03/15/2022   CREATININE 0.73 06/29/2020     Lab Results  Component Value Date   HGBA1C 5.7 (H) 03/15/2022         Component Value Date/Time   CHOL 160 03/15/2022 1019   CHOL 196 01/17/2013 1226   TRIG 112 03/15/2022 1019   TRIG 233 (H) 09/05/2013 1142   TRIG 176 (H) 01/17/2013 1226   HDL 62 03/15/2022 1019   HDL 58 09/05/2013 1142   HDL 57 01/17/2013 1226   CHOLHDL 2.6 03/15/2022 1019   LDLCALC 78 03/15/2022 1019   LDLCALC 58 09/05/2013 1142   LDLCALC 104 (H) 01/17/2013 1226   LDLDIRECT 54 02/16/2018 0903     Clinical ASCVD: No   The 10-year ASCVD risk score (Arnett DK, et al.,  2019) is: 16%   Values used to calculate the score:     Age: 33 years     Sex: Female     Is Non-Hispanic African American: No     Diabetic: Yes     Tobacco smoker: Yes     Systolic Blood Pressure: 528 mmHg     Is BP treated: Yes     HDL Cholesterol: 62 mg/dL     Total Cholesterol: 160 mg/dL    Other: (CHADS2VASc if Afib, PHQ9 if depression, MMRC or CAT for COPD, ACT, DEXA)    BP Readings from Last 3 Encounters:  05/16/22 134/86  03/15/22 (!) 152/93  12/09/20 130/88      SDOH:  (Social Determinants of Health) assessments and interventions performed:    Allergies  Allergen Reactions   Penicillins Hives    Did it involve swelling of the face/tongue/throat, SOB, or low BP? No Did it involve sudden or severe rash/hives, skin peeling, or any reaction on the inside of your mouth or nose? Yes Did you need to seek medical attention at a hospital or doctor's office? No When did it last happen? Occurred at age 13  If all above answers are "NO", may proceed with cephalosporin use.  Did it involve swelling of the face/tongue/throat, SOB, or low BP? No Did it involve sudden or severe rash/hives, skin peeling, or any reaction on the inside of your mouth or nose? Yes Did you need to seek medical attention at  a hospital or doctor's office? No When did it last happen? Occurred at age 41  If all above answers are "NO", may proceed with cephalosporin use. Did it involve swelling of the face/tongue/throat, SOB, or low BP? No Did it involve sudden or severe rash/hives, skin peeling, or any reaction on the inside of your mouth or nose? Yes Did you need to seek medical attention at a hospital or doctor's office? No When did it last happen? Occurred at age 48  If all above answers are "NO", may proceed with cephalosporin use.   Meloxicam Hypertension   Bactrim [Sulfamethoxazole-Trimethoprim] Rash    Medications Reviewed Today     Reviewed by Danella Maiers, [facility] (Pharmacist) on 05/17/22 at  1531  Med List Status: <None>   Medication Order Taking? Sig Documenting Provider Last Dose Status Informant  albuterol (VENTOLIN HFA) 108 (90 Base) MCG/ACT inhaler [id number] No INHALE 2 PUFFS BY MOUTH EVERY 6 HOURS AS NEEDED FOR WHEEZING AND FOR SHORTNESS OF BREATH Delynn Flavin M, DO Taking Active   atenolol (TENORMIN) 50 MG tablet [id number] No Take 1 tablet (50 mg total) by mouth daily. Delynn Flavin M, DO Taking Active   atorvastatin (LIPITOR) 20 MG tablet [id number] No Take 1 tablet (20 mg total) by mouth daily. Delynn Flavin M, DO Taking Active   cyclobenzaprine (FLEXERIL) 10 MG tablet [id number] No Take 1 tablet (10 mg total) by mouth 3 (three) times daily as needed for muscle spasms.  Patient taking differently: Take 10 mg by mouth at bedtime.   Delynn Flavin M, DO Taking Active   gabapentin (NEURONTIN) 300 MG capsule [id number] No Take 1 capsule (300 mg total) by mouth 2 (two) times daily. Delynn Flavin M, DO Taking Active   lisinopril (ZESTRIL) 20 MG tablet [id number] No Take 1 tablet (20 mg total) by mouth daily. Delynn Flavin M, DO Taking Active   Melatonin 5 MG TABS [id number] No Take 5 mg by mouth at bedtime. [provider] Taking Active Self  Omega-3 Fatty Acids (FISH OIL) 1000 MG CAPS [id number] No Take 1,000 mg by mouth daily. [provider] Taking Active   umeclidinium-vilanterol (ANORO ELLIPTA) 62.5-25 MCG/INH AEPB [id number] No Inhale 1 puff into the lungs daily. Raliegh Ip, DO Taking Active               Goals Addressed               This Visit's Progress     Patient Stated     T2DM, COPD (pt-stated)        Current Barriers:  Unable to independently afford treatment regimen Suboptimal therapeutic regimen for COPD   Pharmacist Clinical Goal(s):  patient will verbalize ability to afford treatment regimen maintain control of COPD as evidenced by IMPROVED QOL/BREATHING/LESS RESCUR YSE  through collaboration with  PharmD and provider.    Interventions: 1:1 collaboration with Raliegh Ip, DO regarding development and update of comprehensive plan of care as evidenced by provider attestation and co-signature Inter-disciplinary care team collaboration (see longitudinal plan of care) Comprehensive medication review performed; medication list updated in electronic medical record  Chronic Obstructive Pulmonary Disease:  New goal. Uncontrolled; current treatment:ANORO;  Will plan to transition patient to [facility] for triple therapy Patient declined cessation--will continue to explore Diabetes controlled, but will continue to address 43 pack year history (smokes 1ppd since age 6) [place] quit line info given Recommended start Breztri Assessed patient finances. Will file application for Breztri triple inhaler for [place]  program BP controlled upon recheck x3   Patient Goals/Self-Care Activities patient will:  - take medications as prescribed as evidenced by patient report and record review collaborate with provider on medication access solutions         Plan: Telephone follow up appointment with care management team member scheduled for:  3 MONTHS     Kieth Brightly, PharmD, BCPS Clinical Pharmacist, [facility]  II Phone [phone]

## 2022-05-11 ENCOUNTER — Telehealth: Payer: Self-pay

## 2022-05-11 NOTE — Progress Notes (Signed)
  Chronic Care Management   Note  05/11/2022 Name: Nancy Thornton MRN: [id number] DOB: 08/26/1960  Nancy Thornton is a 61 y.o. year old female who is a primary care patient of Raliegh Ip, DO. I reached out to [name] by phone today in response to a referral sent by Nancy Thornton's PCP.  Nancy Thornton  agreedto scheduling an appointment with the CCM RN Case Manager   Follow up plan: Patient agreed to scheduled appointment with RN Case Manager on 05/17/2022(date/time).   Nancy Thornton, RMA Care Guide [facility] [place], [place] [address] Direct Dial: [phone] [email or web address]

## 2022-05-16 ENCOUNTER — Ambulatory Visit (INDEPENDENT_AMBULATORY_CARE_PROVIDER_SITE_OTHER): Payer: Medicare Other | Admitting: Gastroenterology

## 2022-05-16 ENCOUNTER — Encounter (INDEPENDENT_AMBULATORY_CARE_PROVIDER_SITE_OTHER): Payer: Self-pay | Admitting: Gastroenterology

## 2022-05-16 VITALS — BP 134/86 | HR 64 | Temp 97.8°F | Ht 64.0 in | Wt 151.0 lb

## 2022-05-16 DIAGNOSIS — K1321 Leukoplakia of oral mucosa, including tongue: Secondary | ICD-10-CM | POA: Diagnosis not present

## 2022-05-16 DIAGNOSIS — R634 Abnormal weight loss: Secondary | ICD-10-CM | POA: Diagnosis not present

## 2022-05-16 NOTE — Progress Notes (Signed)
Nancy Thornton, M.D. Gastroenterology & Hepatology [facility] Gastroenterology [address] [place], [place] [address]  Primary Care Physician: Raliegh Ip, DO [address] [place] [address]  I will communicate my assessment and recommendations to the referring MD via EMR.  Problems: Unintentional weight loss.  History of Present Illness: Nancy Thornton is a 61 y.o. female with past medical history of asthma, previous depression, diabetes, hypertension, obesity, fatty liver  who presents for evaluation of unintentional weight loss.  The patient was last seen on 01/09/20. At that time, the patient was presenting chronic diarrhea and was advised to use a low FODMAP diet.  She had celiac serology checked which was negative.  Patient reports her diarrhea resolved on her own.  She reports feeling well for multiple months.  However, she comes to the as she has presented significant weight loss for several months.  States that she noticed some weight loss a year ago as her clothes were looser. She has lost 65 lb since the last time seen in clinic. She states that for the last 4 months she has been steadily in the 150s range.  States that she was eating small amounts for a few months.  She was referred back as there was concern for unintentional weight loss.  She has also presented some episodes of lightheadedness and palpitations on and off, as well as some diaphoresis occasionally.  The patient denies having any nausea, vomiting, fever, chills, hematochezia, melena, hematemesis, abdominal distention, abdominal pain, diarrhea, jaundice, pruritus.   The patient has presented some constipation and sometimes has to do manual maneuvers to move the stool down. Has not been interested in using laxatives as this technique works and does not want to take other medications unless really needed.  He reports that she had her metformin increased from 500 to 1000 mg,  which she believes may have led to weight loss. States she had her metformin stopped in September as her A1c was at goal.  Patient reports feeling a lump in her neck possibly a year ago.  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 Still smoking.  Regarding her previous investigations, she had a CT chest on 04/22/2022 which did not show any nodules or masses.  No other recent cross-sectional abdominal imaging of the abdomen.  Most recent blood testing on 04/21/2022 showed creatinine of 0.89, BUN 15, glucose 128, normal electrolytes, CBC was within normal limits.  LFTs from 03/15/2022 showed AST of 21, ALT of 23, alkaline phosphatase 66 and total bilirubin 1.1.    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  Last Colonoscopy: 01/28/2020 - The examined portion of the ileum was normal. - The entire examined colon is normal. Biopsied. - Non-bleeding internal hemorrhoids.  Random colonic biopsies were negative for microscopic colitis.  Advised to repeat in 10 years.  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  Past Surgical History: Past Surgical History:  Procedure Laterality Date   ANTERIOR LUMBAR FUSION N/A 03/06/2019   Procedure: L4-5 XLIF, L4-5 PSF, LEFT SYNOVIAL CYST RESECTION L4-5;  Surgeon: Venetia Night, MD;  Location: [facility] ORS;  Service: Neurosurgery;  Laterality: N/A;   BACK SURGERY     BIOPSY  01/28/2020   Procedure: BIOPSY;  Surgeon: Dolores Frame, MD;  [facility];  Service: Gastroenterology;;   CHOLECYSTECTOMY     COLONOSCOPY WITH PROPOFOL N/A 01/28/2020   Procedure: COLONOSCOPY WITH PROPOFOL;  Surgeon: Dolores Frame, MD;  Location: [facility];  Service: Gastroenterology;  Laterality: N/A;  915   CYSTOSTOMY W/ BLADDER BIOPSY  1990   punctured hole in bladder, had open procedure to repair   MENISCUS REPAIR     TONSILLECTOMY     TOTAL VAGINAL HYSTERECTOMY     61 y/o. bleeding. ovaries left in situ    Family History: Family History  Problem Relation Age of Onset   Diabetes Mother    Hypertension Father    Colon cancer Neg Hx    Esophageal cancer Neg Hx    Rectal cancer Neg Hx    Stomach cancer Neg Hx    Breast cancer Neg Hx     Social History: Social History   Tobacco Use  Smoking Status Light Smoker   Packs/day: 0.50   Types: Cigarettes   Start date: 10/18/1977   Last attempt to quit: 02/10/2019   Years since quitting: 3.2  Smokeless Tobacco Never   Social History   Substance and Sexual Activity  Alcohol Use No   Social History   Substance and Sexual Activity  Drug Use No    Allergies: Allergies  Allergen Reactions   Penicillins Hives    Did it involve swelling of the face/tongue/throat, SOB, or low BP? No Did it involve sudden or severe rash/hives, skin peeling, or any reaction on the inside of your mouth or nose? Yes Did you need to seek medical attention at a hospital or doctor's office? No When did it last happen? Occurred at age 31  If all above answers are "NO", may proceed with cephalosporin use.  Did it involve swelling of the face/tongue/throat, SOB, or low BP? No Did it involve sudden or severe rash/hives, skin peeling, or any reaction on the inside of your mouth or nose? Yes Did you need to seek medical attention at a hospital or doctor's office? No When did it last happen? Occurred at age 89  If all above answers are "NO", may proceed with cephalosporin use. Did it involve  swelling of the face/tongue/throat, SOB, or low BP? No Did it involve sudden or severe rash/hives, skin peeling, or any reaction on the inside of your mouth or nose? Yes Did you need to seek medical attention at a hospital or doctor's office? No When did it last happen? Occurred at age 66  If all above answers are "NO", may proceed with cephalosporin use.   Meloxicam Hypertension   Bactrim [Sulfamethoxazole-Trimethoprim] Rash    Medications: Current Outpatient Medications  Medication Sig Dispense Refill   albuterol (VENTOLIN HFA) 108 (90 Base) MCG/ACT inhaler INHALE 2 PUFFS BY MOUTH EVERY 6 HOURS AS NEEDED FOR WHEEZING AND FOR SHORTNESS OF BREATH 9 g 0   atenolol (TENORMIN) 50 MG tablet Take 1 tablet (50 mg total) by mouth daily. 90 tablet 3   atorvastatin (LIPITOR) 20 MG tablet Take 1 tablet (20 mg total) by mouth daily. 90 tablet 3  cyclobenzaprine (FLEXERIL) 10 MG tablet Take 1 tablet (10 mg total) by mouth 3 (three) times daily as needed for muscle spasms. (Patient taking differently: Take 10 mg by mouth at bedtime.) 30 tablet 1   gabapentin (NEURONTIN) 300 MG capsule Take 1 capsule (300 mg total) by mouth 2 (two) times daily. 180 capsule 3   lisinopril (ZESTRIL) 20 MG tablet Take 1 tablet (20 mg total) by mouth daily. 90 tablet 3   Melatonin 5 MG TABS Take 5 mg by mouth at bedtime.     Omega-3 Fatty Acids (FISH OIL) 1000 MG CAPS Take 1,000 mg by mouth daily.     umeclidinium-vilanterol (ANORO ELLIPTA) 62.5-25 MCG/INH AEPB Inhale 1 puff into the lungs daily. 28 each 0   No current facility-administered medications for this visit.    Review of Systems: GENERAL: negative for malaise, night sweats HEENT: No changes in hearing or vision, no nose bleeds or other nasal problems. NECK: Negative for lumps, goiter, pain and significant neck swelling RESPIRATORY: Negative for cough, wheezing CARDIOVASCULAR: Negative for chest pain, leg swelling, palpitations, orthopnea GI: SEE  HPI MUSCULOSKELETAL: Negative for joint pain or swelling, back pain, and muscle pain. SKIN: Negative for lesions, rash PSYCH: Negative for sleep disturbance, mood disorder and recent psychosocial stressors. HEMATOLOGY Negative for prolonged bleeding, bruising easily, and swollen nodes. ENDOCRINE: Negative for cold or heat intolerance, polyuria, polydipsia and goiter. NEURO: negative for tremor, gait imbalance, syncope and seizures. The remainder of the review of systems is noncontributory.   Physical Exam: BP 134/86 (BP Location: Left Arm, Patient Position: Sitting, Cuff Size: Small)   Pulse 64   Temp 97.8 F (36.6 C) (Temporal)   Ht 5' 4" (1.626 m)   Wt 151 lb (68.5 kg)   BMI 25.92 kg/m  GENERAL: The patient is AO x3, in no acute distress. HEENT: Head is normocephalic and atraumatic. EOMI are intact. Mouth is well hydrated. Has white leukoplakia NECK: Has slightly enlarged L submandibular gland but this is mobile and not hard or painful LUNGS: Clear to auscultation. No presence of rhonchi/wheezing/rales. Adequate chest expansion HEART: RRR, normal s1 and s2. ABDOMEN: Soft, nontender, no guarding, no peritoneal signs, and nondistended. BS +. No masses. EXTREMITIES: Without any cyanosis, clubbing, rash, lesions or edema. NEUROLOGIC: AOx3, no focal motor deficit. SKIN: no jaundice, no rashes  Imaging/Labs: as above  I personally reviewed and interpreted the available labs, imaging and endoscopic files.  Impression and Plan: Noelani Grisanti is a 61 y.o. female with past medical history of asthma, previous depression, diabetes, hypertension, obesity, fatty liver  who presents for evaluation of unintentional weight loss.  The patient has presented severe weight loss since the last time she was seen in clinic.  Has not presented any other associated symptoms.  In physical exam, there was presence of a slightly enlarged left submandibular gland but no other associated adenopathy.  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  I advised the patient to follow-up with the ENT doctor she saw in the past for evaluation of her leukoplakia and mild enlargement of her submandibular gland. I advised her about the importance of smoking cessation.  From the gastrointestinal perspective, she has not presented any other associated symptoms but we will rule out other etiologies for her weight loss, primarily an upper GI malignancy and an intra-abdominal pathology with an EGD and a CT of the abdomen pelvis with   IV contrast.  - Schedule EGD - Schedule CT abdomen/pelvis with IV contrast - Reach ENT doctor to evaluate again leukoplakia and neck mass - If concern for persistent weight loss, PCP can order neck US or CT   - Patient was counseled regarding the importance of stopping cigarette smoking. The patient was informed about the long term effects of smoking, progression of current disease.  All questions were answered.      Nancy Blazing, MD Gastroenterology and Hepatology [facility] Gastroenterology

## 2022-05-16 NOTE — Patient Instructions (Addendum)
Schedule EGD Schedule CT abdomen/pelvis with IV contrast Reach ENT doctor to evaluate again leukoplakia and neck mass If concern for persistent weight loss, PCP can order neck US or CT   Patient was counseled regarding the importance of stopping cigarette smoking. The patient was informed about the long term effects of smoking, progression of current disease.

## 2022-05-16 NOTE — H&P (View-Only) (Signed)
Nancy Thornton, M.D. Gastroenterology & Hepatology [facility] Gastroenterology [address] [place], [place] [address]  Primary Care Physician: Raliegh Ip, DO [address] [place] [address]  I will communicate my assessment and recommendations to the referring MD via EMR.  Problems: Unintentional weight loss.  History of Present Illness: Nancy Thornton is a 61 y.o. female with past medical history of asthma, previous depression, diabetes, hypertension, obesity, fatty liver  who presents for evaluation of unintentional weight loss.  The patient was last seen on 01/09/20. At that time, the patient was presenting chronic diarrhea and was advised to use a low FODMAP diet.  She had celiac serology checked which was negative.  Patient reports her diarrhea resolved on her own.  She reports feeling well for multiple months.  However, she comes to the as she has presented significant weight loss for several months.  States that she noticed some weight loss a year ago as her clothes were looser. She has lost 65 lb since the last time seen in clinic. She states that for the last 4 months she has been steadily in the 150s range.  States that she was eating small amounts for a few months.  She was referred back as there was concern for unintentional weight loss.  She has also presented some episodes of lightheadedness and palpitations on and off, as well as some diaphoresis occasionally.  The patient denies having any nausea, vomiting, fever, chills, hematochezia, melena, hematemesis, abdominal distention, abdominal pain, diarrhea, jaundice, pruritus.   The patient has presented some constipation and sometimes has to do manual maneuvers to move the stool down. Has not been interested in using laxatives as this technique works and does not want to take other medications unless really needed.  He reports that she had her metformin increased from 500 to 1000 mg,  which she believes may have led to weight loss. States she had her metformin stopped in September as her A1c was at goal.  Patient reports feeling a lump in her neck possibly a year ago.  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 Still smoking.  Regarding her previous investigations, she had a CT chest on 04/22/2022 which did not show any nodules or masses.  No other recent cross-sectional abdominal imaging of the abdomen.  Most recent blood testing on 04/21/2022 showed creatinine of 0.89, BUN 15, glucose 128, normal electrolytes, CBC was within normal limits.  LFTs from 03/15/2022 showed AST of 21, ALT of 23, alkaline phosphatase 66 and total bilirubin 1.1.    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  Last Colonoscopy: 01/28/2020 - The examined portion of the ileum was normal. - The entire examined colon is normal. Biopsied. - Non-bleeding internal hemorrhoids.  Random colonic biopsies were negative for microscopic colitis.  Advised to repeat in 10 years.  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  Past Surgical History: Past Surgical History:  Procedure Laterality Date   ANTERIOR LUMBAR FUSION N/A 03/06/2019   Procedure: L4-5 XLIF, L4-5 PSF, LEFT SYNOVIAL CYST RESECTION L4-5;  Surgeon: Venetia Night, MD;  Location: [facility] ORS;  Service: Neurosurgery;  Laterality: N/A;   BACK SURGERY     BIOPSY  01/28/2020   Procedure: BIOPSY;  Surgeon: Dolores Frame, MD;  [facility];  Service: Gastroenterology;;   CHOLECYSTECTOMY     COLONOSCOPY WITH PROPOFOL N/A 01/28/2020   Procedure: COLONOSCOPY WITH PROPOFOL;  Surgeon: Dolores Frame, MD;  [facility]: [facility];  Service: Gastroenterology;  Laterality: N/A;  915   CYSTOSTOMY W/ BLADDER BIOPSY  1990   punctured hole in bladder, had open procedure to repair   MENISCUS REPAIR     TONSILLECTOMY     TOTAL VAGINAL HYSTERECTOMY     61 y/o. bleeding. ovaries left in situ    Family History: Family History  Problem Relation Age of Onset   Diabetes Mother    Hypertension Father    Colon cancer Neg Hx    Esophageal cancer Neg Hx    Rectal cancer Neg Hx    Stomach cancer Neg Hx    Breast cancer Neg Hx     Social History: Social History   Tobacco Use  Smoking Status Light Smoker   Packs/day: 0.50   Types: Cigarettes   Start date: 10/18/1977   Last attempt to quit: 02/10/2019   Years since quitting: 3.2  Smokeless Tobacco Never   Social History   Substance and Sexual Activity  Alcohol Use No   Social History   Substance and Sexual Activity  Drug Use No    Allergies: Allergies  Allergen Reactions   Penicillins Hives    Did it involve swelling of the face/tongue/throat, SOB, or low BP? No Did it involve sudden or severe rash/hives, skin peeling, or any reaction on the inside of your mouth or nose? Yes Did you need to seek medical attention at a hospital or doctor's office? No When did it last happen? Occurred at age 31  If all above answers are "NO", may proceed with cephalosporin use.  Did it involve swelling of the face/tongue/throat, SOB, or low BP? No Did it involve sudden or severe rash/hives, skin peeling, or any reaction on the inside of your mouth or nose? Yes Did you need to seek medical attention at a hospital or doctor's office? No When did it last happen? Occurred at age 89  If all above answers are "NO", may proceed with cephalosporin use. Did it involve  swelling of the face/tongue/throat, SOB, or low BP? No Did it involve sudden or severe rash/hives, skin peeling, or any reaction on the inside of your mouth or nose? Yes Did you need to seek medical attention at a hospital or doctor's office? No When did it last happen? Occurred at age 66  If all above answers are "NO", may proceed with cephalosporin use.   Meloxicam Hypertension   Bactrim [Sulfamethoxazole-Trimethoprim] Rash    Medications: Current Outpatient Medications  Medication Sig Dispense Refill   albuterol (VENTOLIN HFA) 108 (90 Base) MCG/ACT inhaler INHALE 2 PUFFS BY MOUTH EVERY 6 HOURS AS NEEDED FOR WHEEZING AND FOR SHORTNESS OF BREATH 9 g 0   atenolol (TENORMIN) 50 MG tablet Take 1 tablet (50 mg total) by mouth daily. 90 tablet 3   atorvastatin (LIPITOR) 20 MG tablet Take 1 tablet (20 mg total) by mouth daily. 90 tablet 3  cyclobenzaprine (FLEXERIL) 10 MG tablet Take 1 tablet (10 mg total) by mouth 3 (three) times daily as needed for muscle spasms. (Patient taking differently: Take 10 mg by mouth at bedtime.) 30 tablet 1   gabapentin (NEURONTIN) 300 MG capsule Take 1 capsule (300 mg total) by mouth 2 (two) times daily. 180 capsule 3   lisinopril (ZESTRIL) 20 MG tablet Take 1 tablet (20 mg total) by mouth daily. 90 tablet 3   Melatonin 5 MG TABS Take 5 mg by mouth at bedtime.     Omega-3 Fatty Acids (FISH OIL) 1000 MG CAPS Take 1,000 mg by mouth daily.     umeclidinium-vilanterol (ANORO ELLIPTA) 62.5-25 MCG/INH AEPB Inhale 1 puff into the lungs daily. 28 each 0   No current facility-administered medications for this visit.    Review of Systems: GENERAL: negative for malaise, night sweats HEENT: No changes in hearing or vision, no nose bleeds or other nasal problems. NECK: Negative for lumps, goiter, pain and significant neck swelling RESPIRATORY: Negative for cough, wheezing CARDIOVASCULAR: Negative for chest pain, leg swelling, palpitations, orthopnea GI: SEE  HPI MUSCULOSKELETAL: Negative for joint pain or swelling, back pain, and muscle pain. SKIN: Negative for lesions, rash PSYCH: Negative for sleep disturbance, mood disorder and recent psychosocial stressors. HEMATOLOGY Negative for prolonged bleeding, bruising easily, and swollen nodes. ENDOCRINE: Negative for cold or heat intolerance, polyuria, polydipsia and goiter. NEURO: negative for tremor, gait imbalance, syncope and seizures. The remainder of the review of systems is noncontributory.   Physical Exam: BP 134/86 (BP Location: Left Arm, Patient Position: Sitting, Cuff Size: Small)   Pulse 64   Temp 97.8 F (36.6 C) (Temporal)   Ht 5\' 4"  (1.626 m)   Wt 151 lb (68.5 kg)   BMI 25.92 kg/m  GENERAL: The patient is AO x3, in no acute distress. HEENT: Head is normocephalic and atraumatic. EOMI are intact. Mouth is well hydrated. Has white leukoplakia NECK: Has slightly enlarged L submandibular gland but this is mobile and not hard or painful LUNGS: Clear to auscultation. No presence of rhonchi/wheezing/rales. Adequate chest expansion HEART: RRR, normal s1 and s2. ABDOMEN: Soft, nontender, no guarding, no peritoneal signs, and nondistended. BS +. No masses. EXTREMITIES: Without any cyanosis, clubbing, rash, lesions or edema. NEUROLOGIC: AOx3, no focal motor deficit. SKIN: no jaundice, no rashes  Imaging/Labs: as above  I personally reviewed and interpreted the available labs, imaging and endoscopic files.  Impression and Plan: Candi LeashDrusilla Pintor is a 61 y.o. female with past medical history of asthma, previous depression, diabetes, hypertension, obesity, fatty liver  who presents for evaluation of unintentional weight loss.  The patient has presented severe weight loss since the last time she was seen in clinic.  Has not presented any other associated symptoms.  In physical exam, there was presence of a slightly enlarged left submandibular gland but no other associated adenopathy.  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  I advised the patient to follow-up with the ENT doctor she saw in the past for evaluation of her leukoplakia and mild enlargement of her submandibular gland. I advised her about the importance of smoking cessation.  From the gastrointestinal perspective, she has not presented any other associated symptoms but we will rule out other etiologies for her weight loss, primarily an upper GI malignancy and an intra-abdominal pathology with an EGD and a CT of the abdomen pelvis with  IV contrast.  - Schedule EGD - Schedule CT abdomen/pelvis with IV contrast - Reach ENT doctor to evaluate again leukoplakia and neck mass - If concern for persistent weight loss, PCP can order neck US or CT   - Patient was counseled regarding the importance of stopping cigarette smoking. The patient was informed about the long term effects of smoking, progression of current disease.  All questions were answered.      Nancy Blazing, MD Gastroenterology and Hepatology [facility] Gastroenterology

## 2022-05-17 ENCOUNTER — Encounter: Payer: Self-pay | Admitting: *Deleted

## 2022-05-17 ENCOUNTER — Ambulatory Visit: Payer: Medicare Other | Admitting: *Deleted

## 2022-05-17 ENCOUNTER — Telehealth: Payer: Self-pay | Admitting: *Deleted

## 2022-05-17 DIAGNOSIS — J410 Simple chronic bronchitis: Secondary | ICD-10-CM

## 2022-05-17 DIAGNOSIS — E1165 Type 2 diabetes mellitus with hyperglycemia: Secondary | ICD-10-CM

## 2022-05-17 NOTE — Patient Instructions (Signed)
Please call the care guide team at [phone] if you need to cancel or reschedule your appointment.   If you are experiencing a Mental Health or [place] or need someone to talk to, please call the Suicide and Crisis Lifeline: 988 call the [place] National Suicide Prevention Lifeline: (806) 393-5539 or TTY: (830)163-0303 TTY 705 051 3161) to talk to a trained counselor call 1-800-273-TALK (toll free, 24 hour hotline) go to [facility] [place] [phone]) call the [place]: [phone] call 27   Following is a copy of your full provider care plan:   Goals Addressed             This Visit's Progress    CCM (COPD) EXPECTED OUTCOME: MONITOR, SELF-MANAGE AND REDUCE SYMPTOMS OF COPD       Current Barriers:  Knowledge Deficits related to COPD management Chronic Disease Management support and education needs related to COPD, smoking cessation No Advanced Directives in place- requests information be mailed Patient reports she has all medications and taking as prescribed including inhalers, pt states she smokes 10 cigarettes per day and has tried varying smoking cessation interventions including patches, states she has cut down a lot and will work on this in her own time and how she is best able to do it.    Planned Interventions: Provided patient with basic written and verbal COPD education on self care/management/and exacerbation prevention Advised patient to track and manage COPD triggers Provided instruction about proper use of medications used for management of COPD including inhalers Advised patient to self assesses COPD action plan zone and make appointment with provider if in the yellow zone for 48 hours without improvement Advised patient to engage in light exercise as tolerated 3-5 days a week to aid in the the management of COPD Provided education about and advised patient to utilize  infection prevention strategies to reduce risk of respiratory infection Screening for signs and symptoms of depression related to chronic disease state  Assessed social determinant of health barriers Advanced directives packet mailed  Symptom Management: Take medications as prescribed   Attend all scheduled provider appointments Call pharmacy for medication refills 3-7 days in advance of running out of medications Attend church or other social activities Perform all self care activities independently  Perform IADL's (shopping, preparing meals, housekeeping, managing finances) independently Call provider office for new concerns or questions  eliminate smoking in my home identify and remove indoor air pollutants limit outdoor activity during cold weather listen for public air quality announcements every day do breathing exercises every day develop a rescue plan follow rescue plan if symptoms flare-up get at least 7 to 8 hours of sleep at night practice relaxation or meditation daily Practice good handwashing, wear a mask as needed Look over and complete Advanced directives packet  Follow Up Plan: Telephone follow up appointment with care management team member scheduled for: 07/12/21 at 1045 am       CCM (DIABETES) EXPECTED OUTCOME:  MONITOR, SELF-MANAGE AND REDUCE SYMPTOMS OF DIABETES       Current Barriers:  Knowledge Deficits related to Diabetes management Chronic Disease Management support and education needs related to Diabetes and diet No Advanced Directives in place- requests information be mailed Patient reports she lives alone, has adult daughter she can call of if needed, has not been checking CBG, reports " I check only once in awhile",  random readings around 130, not sure what fasting readings are, has CBG and blood pressure  checked at [address] once weekly when she visits for Bible study.   Patient reports she works part time and walks a lot at work Pharmacist is currently  working with patient Patient reports she has lost 75 pounds since December 2020 and states " I'm sure it's from the metformin but I'm having tests to rule out anything else"  Patient I shaving EGD and CT Abdomen/ pelvis in the near future  Planned Interventions: Provided education to patient about basic DM disease process; Reviewed medications with patient and discussed importance of medication adherence;        Reviewed prescribed diet with patient carbohydrate modified; Discussed plans with patient for ongoing care management follow up and provided patient with direct contact information for care management team;      Provided patient with written educational materials related to hypo and hyperglycemia and importance of correct treatment;       Advised patient, providing education and rationale, to check cbg per MD order  and record        Review of patient status, including review of consultants reports, relevant laboratory and other test results, and medications completed;       Screening for signs and symptoms of depression related to chronic disease state;        Assessed social determinant of health barriers;        Reviewed importance of exercise and getting outdoors daily if able/ weather permitting  Symptom Management: Take medications as prescribed   Attend all scheduled provider appointments Call pharmacy for medication refills 3-7 days in advance of running out of medications Attend church or other social activities Perform all self care activities independently  Perform IADL's (shopping, preparing meals, housekeeping, managing finances) independently Call provider office for new concerns or questions  check blood sugar at prescribed times: per MD order  check feet daily for cuts, sores or redness enter blood sugar readings and medication or insulin into daily log take the blood sugar log to all doctor visits take the blood sugar meter to all doctor visits trim toenails  straight across drink 6 to 8 glasses of water each day fill half of plate with vegetables read food labels for fat, fiber, carbohydrates and portion size Look over education sent via My Chart- hypoglycemia Try to get outside daily and walk Please start checking blood sugar again  Follow Up Plan: Telephone follow up appointment with care management team member scheduled for: 07/12/21 at 1045 am          Patient verbalizes understanding of instructions and care plan provided today and agrees to view in [place]. Active MyChart status and patient understanding of how to access instructions and care plan via MyChart confirmed with patient.     Telephone follow up appointment with care management team member scheduled for:   07/12/21 at 76 am  COPD Action Plan A COPD action plan is a description of what to do when you have a flare (exacerbation) of chronic obstructive pulmonary disease (COPD). Your action plan is a color-coded plan that lists the symptoms that indicate whether your condition is under control and what actions to take. If you have symptoms in the green zone, it means you are doing well that day. If you have symptoms in the yellow zone, it means you are having a bad day or an exacerbation. If you have symptoms in the red zone, you need urgent medical care. Follow the plan that you and your health care provider developed. Review  your plan with your health care provider at each visit. Red zone Symptoms in this zone mean that you should get medical help right away. They include: Feeling very short of breath, even when you are resting. Not being able to do any activities because of poor breathing. Not being able to sleep because of poor breathing. Fever or shaking chills. Feeling confused or very sleepy. Chest pain. Coughing up blood. If you have any of these symptoms, call emergency services (911 in the [place] or go to the nearest emergency room. Yellow zone Symptoms in this  zone mean that your condition may be getting worse. They include: Feeling more short of breath than usual. Having less energy for daily activities than usual. Phlegm or mucus that is thicker than usual. Needing to use your rescue inhaler or nebulizer more often than usual. More ankle swelling than usual. Coughing more than usual. Feeling like you have a chest cold. Trouble sleeping due to COPD symptoms. Decreased appetite. COPD medicines not helping as much as usual. If you experience any "yellow" symptoms: Keep taking your daily medicines as directed. Use your quick-relief inhaler as told by your health care provider. If you were prescribed steroid medicine to take by mouth (oral medicine), start taking it as told by your health care provider. If you were prescribed an antibiotic medicine, start taking it as told by your health care provider. Do not stop taking the antibiotic even if you start to feel better. Use oxygen as told by your health care provider. Get more rest. Do your pursed-lip breathing exercises. Do not smoke. Avoid any irritants in the air. If your signs and symptoms do not improve after taking these steps, call your health care provider right away. Green zone Symptoms in this zone mean that you are doing well. They include: Being able to do your usual activities and exercise. Having the usual amount of coughing, including the same amount of phlegm or mucus. Being able to sleep well. Having a good appetite. Where to find more information: You can find more information about COPD from: American Lung Association, My COPD Action Plan: www.lung.org COPD Foundation: www.copdfoundation.[place]: https://wilson-eaton.com/ Follow these instructions at home: Continue taking your daily medicines as told by your health care provider. Make sure you receive all the immunizations that your health care provider recommends, especially the pneumococcal and  influenza vaccines. Wash your hands often with soap and water. Have family members wash their hands too. Regular hand washing can help prevent infections. Follow your usual exercise and diet plan. Avoid irritants in the air, such as smoke. Do not use any products that contain nicotine or tobacco. These products include cigarettes, chewing tobacco, and vaping devices, such as e-cigarettes. If you need help quitting, ask your health care provider. Summary A COPD action plan tells you what to do when you have a flare (exacerbation) of chronic obstructive pulmonary disease (COPD). Follow each action plan for your symptoms. If you have any symptoms in the red zone, call emergency services (911 in the [place] or go to the nearest emergency room. This information is not intended to replace advice given to you by your health care provider. Make sure you discuss any questions you have with your health care provider. Document Revised: 04/28/2020 Document Reviewed: 04/28/2020 Elsevier Patient Education  [place]. Hypoglycemia Hypoglycemia occurs when the level of sugar (glucose) in the blood is too low. Hypoglycemia can happen in people who have or do not have  diabetes. It can develop quickly, and it can be a medical emergency. For most people, a blood glucose level below 70 mg/dL (3.9 mmol/L) is considered hypoglycemia. Glucose is a type of sugar that provides the body's main source of energy. Certain hormones (insulin and glucagon) control the level of glucose in the blood. Insulin lowers blood glucose, and glucagon raises blood glucose. Hypoglycemia can result from having too much insulin in the bloodstream, or from not eating enough food that contains glucose. You may also have reactive hypoglycemia, which happens within 4 hours after eating a meal. What are the causes? Hypoglycemia occurs most often in people who have diabetes and may be caused by: Diabetes medicine. Not eating enough, or not  eating often enough. Increased physical activity. Drinking alcohol on an empty stomach. If you do not have diabetes, hypoglycemia may be caused by: A tumor in the pancreas. Not eating enough, or not eating for long periods at a time (fasting). A severe infection or illness. Problems after having bariatric surgery. Organ failure, such as kidney or liver failure. Certain medicines. What increases the risk? Hypoglycemia is more likely to develop in people who: Have diabetes and take medicines to lower blood glucose. Abuse alcohol. Have a severe illness. What are the signs or symptoms? Symptoms vary depending on whether the condition is mild, moderate, or severe. Mild hypoglycemia Hunger. Sweating and feeling clammy. Dizziness or feeling light-headed. Sleepiness or restless sleep. Nausea. Increased heart rate. Headache. Blurry vision. Mood changes, such as irritability or anxiety. Tingling or numbness around the mouth, lips, or tongue. Moderate hypoglycemia Confusion and poor judgment. Behavior changes. Weakness. Irregular heartbeat. A change in coordination. Severe hypoglycemia Severe hypoglycemia is a medical emergency. It can cause: Fainting. Seizures. Loss of consciousness (coma). Death. How is this diagnosed? Hypoglycemia is diagnosed with a blood test to measure your blood glucose level. This blood test is done while you are having symptoms. Your health care provider may also do a physical exam and review your medical history. How is this treated? This condition can be treated by immediately eating or drinking something that contains sugar with 15 grams of fast-acting carbohydrate, such as: 4 oz (120 mL) of fruit juice. 4 oz (120 mL) of regular soda (not diet soda). Several pieces of hard candy. Check food labels to find out how many pieces to eat for 15 grams. 1 Tbsp (15 mL) of sugar or honey. 4 glucose tablets. 1 tube of glucose gel. Treating hypoglycemia if you  have diabetes If you are alert and able to swallow safely, follow the 15:15 rule: Take 15 grams of a fast-acting carbohydrate. Talk with your health care provider about how much you should take. Options for getting 15 grams of fast-acting carbohydrate include: Glucose tablets (take 4 tablets). Several pieces of hard candy. Check food labels to find out how many pieces to eat for 15 grams. 4 oz (120 mL) of fruit juice. 4 oz (120 mL) of regular soda (not diet soda). 1 Tbsp (15 mL) of sugar or honey. 1 tube of glucose gel. Check your blood glucose 15 minutes after you take the carbohydrate. If the repeat blood glucose level is still at or below 70 mg/dL (3.9 mmol/L), take 15 grams of a carbohydrate again. If your blood glucose level does not increase above 70 mg/dL (3.9 mmol/L) after 3 tries, seek emergency medical care. After your blood glucose level returns to normal, eat a meal or a snack within 1 hour.  Treating severe hypoglycemia Severe hypoglycemia  is when your blood glucose level is below 54 mg/dL (3 mmol/L). Severe hypoglycemia is a medical emergency. Get medical help right away. If you have severe hypoglycemia and you cannot eat or drink, you will need to be given glucagon. A family member or close friend should learn how to check your blood glucose and how to give you glucagon. Ask your health care provider if you need to have an emergency glucagon kit available. Severe hypoglycemia may need to be treated in a hospital. The treatment may include getting glucose through an IV. You may also need treatment for the cause of your hypoglycemia. Follow these instructions at home:  General instructions Take over-the-counter and prescription medicines only as told by your health care provider. Monitor your blood glucose as told by your health care provider. If you drink alcohol: Limit how much you have to: 0-1 drink a day for women who are not pregnant. 0-2 drinks a day for men. Know how  much alcohol is in your drink. In the [place] one drink equals one 12 oz bottle of beer (355 mL), one 5 oz glass of wine (148 mL), or one 1 oz glass of hard liquor (44 mL). Be sure to eat food along with drinking alcohol. Be aware that alcohol is absorbed quickly and may have lingering effects that may result in hypoglycemia later. Be sure to do ongoing glucose monitoring. Keep all follow-up visits. This is important. If you have diabetes: Always have a fast-acting carbohydrate (15 grams) option with you to treat low blood glucose. Follow your diabetes management plan as directed by your health care provider. Make sure you: Know the symptoms of hypoglycemia. It is important to treat it right away to prevent it from becoming severe. Check your blood glucose as often as told. Always check before and after exercise. Always check your blood glucose before you drive a motorized vehicle. Take your medicines as told. Follow your meal plan. Eat on time, and do not skip meals. Share your diabetes management plan with people in your workplace, school, and household. Carry a medical alert card or wear medical alert jewelry. Where to find more information American Diabetes Association: www.diabetes.org Contact a health care provider if: You have problems keeping your blood glucose in your target range. You have frequent episodes of hypoglycemia. Get help right away if: You continue to have hypoglycemia symptoms after eating or drinking something that contains 15 grams of fast-acting carbohydrate, and you cannot get your blood glucose above 70 mg/dL (3.9 mmol/L) while following the 15:15 rule. Your blood glucose is below 54 mg/dL (3 mmol/L). You have a seizure. You faint. These symptoms may represent a serious problem that is an emergency. Do not wait to see if the symptoms will go away. Get medical help right away. Call your local emergency services (911 in the [place] Do not drive yourself to the  hospital. Summary Hypoglycemia occurs when the level of sugar (glucose) in the blood is too low. Hypoglycemia can happen in people who have or do not have diabetes. It can develop quickly, and it can be a medical emergency. Make sure you know the symptoms of hypoglycemia and how to treat it. Always have a fast-acting carbohydrate option with you to treat low blood sugar. This information is not intended to replace advice given to you by your health care provider. Make sure you discuss any questions you have with your health care provider. Document Revised: 05/21/2020 Document Reviewed: 05/21/2020 Elsevier Patient Education  2023 Elsevier  Inc.

## 2022-05-17 NOTE — Telephone Encounter (Signed)
PA done via [facility]. Auth# [id number], DOS: Jun 03, 2022 - Jul 03, 2022

## 2022-05-17 NOTE — Chronic Care Management (AMB) (Signed)
Chronic Care Management   CCM RN Visit Note  05/17/2022 Name: Georgina Krist MRN: [id number] DOB: 07-15-1960  Subjective: Nancy Thornton is a 61 y.o. year old female who is a primary care patient of Raliegh Ip, DO. The patient was referred to the Chronic Care Management team for assistance with care management needs subsequent to provider initiation of CCM services and plan of care.    Today's Visit:  Engaged with patient by telephone for initial visit.     SDOH Interventions Today    Flowsheet Row Most Recent Value  SDOH Interventions   Food Insecurity Interventions Intervention Not Indicated  Housing Interventions Intervention Not Indicated  Transportation Interventions Intervention Not Indicated  Utilities Interventions Intervention Not Indicated  Financial Strain Interventions Intervention Not Indicated  Physical Activity Interventions Intervention Not Indicated  [pt works part time and walks alot at work]  Stress Interventions Intervention Not Indicated  Social Connections Interventions Intervention Not Indicated         Goals Addressed             This Visit's Progress    CCM (COPD) EXPECTED OUTCOME: MONITOR, SELF-MANAGE AND REDUCE SYMPTOMS OF COPD       Current Barriers:  Knowledge Deficits related to COPD management Chronic Disease Management support and education needs related to COPD, smoking cessation No Advanced Directives in place- requests information be mailed Patient reports she has all medications and taking as prescribed including inhalers, pt states she smokes 10 cigarettes per day and has tried varying smoking cessation interventions including patches, states she has cut down a lot and will work on this in her own time and how she is best able to do it.    Planned Interventions: Provided patient with basic written and verbal COPD education on self care/management/and exacerbation prevention Advised patient to track and manage COPD  triggers Provided instruction about proper use of medications used for management of COPD including inhalers Advised patient to self assesses COPD action plan zone and make appointment with provider if in the yellow zone for 48 hours without improvement Advised patient to engage in light exercise as tolerated 3-5 days a week to aid in the the management of COPD Provided education about and advised patient to utilize infection prevention strategies to reduce risk of respiratory infection Screening for signs and symptoms of depression related to chronic disease state  Assessed social determinant of health barriers Advanced directives packet mailed  Symptom Management: Take medications as prescribed   Attend all scheduled provider appointments Call pharmacy for medication refills 3-7 days in advance of running out of medications Attend church or other social activities Perform all self care activities independently  Perform IADL's (shopping, preparing meals, housekeeping, managing finances) independently Call provider office for new concerns or questions  eliminate smoking in my home identify and remove indoor air pollutants limit outdoor activity during cold weather listen for public air quality announcements every day do breathing exercises every day develop a rescue plan follow rescue plan if symptoms flare-up get at least 7 to 8 hours of sleep at night practice relaxation or meditation daily Practice good handwashing, wear a mask as needed Look over and complete Advanced directives packet  Follow Up Plan: Telephone follow up appointment with care management team member scheduled for: 07/12/21 at 1045 am       CCM (DIABETES) EXPECTED OUTCOME:  MONITOR, SELF-MANAGE AND REDUCE SYMPTOMS OF DIABETES       Current Barriers:  Knowledge Deficits related to Diabetes management  Chronic Disease Management support and education needs related to Diabetes and diet No Advanced Directives in  place- requests information be mailed Patient reports she lives alone, has adult daughter she can call of if needed, has not been checking CBG, reports " I check only once in awhile",  random readings around 130, not sure what fasting readings are, has CBG and blood pressure checked at [address] once weekly when she visits for Bible study.   Patient reports she works part time and walks a lot at work Pharmacist is currently working with patient Patient reports she has lost 75 pounds since December 2020 and states " I'm sure it's from the metformin but I'm having tests to rule out anything else"  Patient I shaving EGD and CT Abdomen/ pelvis in the near future  Planned Interventions: Provided education to patient about basic DM disease process; Reviewed medications with patient and discussed importance of medication adherence;        Reviewed prescribed diet with patient carbohydrate modified; Discussed plans with patient for ongoing care management follow up and provided patient with direct contact information for care management team;      Provided patient with written educational materials related to hypo and hyperglycemia and importance of correct treatment;       Advised patient, providing education and rationale, to check cbg per MD order  and record        Review of patient status, including review of consultants reports, relevant laboratory and other test results, and medications completed;       Screening for signs and symptoms of depression related to chronic disease state;        Assessed social determinant of health barriers;        Reviewed importance of exercise and getting outdoors daily if able/ weather permitting  Symptom Management: Take medications as prescribed   Attend all scheduled provider appointments Call pharmacy for medication refills 3-7 days in advance of running out of medications Attend church or other social activities Perform all self care activities independently   Perform IADL's (shopping, preparing meals, housekeeping, managing finances) independently Call provider office for new concerns or questions  check blood sugar at prescribed times: per MD order  check feet daily for cuts, sores or redness enter blood sugar readings and medication or insulin into daily log take the blood sugar log to all doctor visits take the blood sugar meter to all doctor visits trim toenails straight across drink 6 to 8 glasses of water each day fill half of plate with vegetables read food labels for fat, fiber, carbohydrates and portion size Look over education sent via My Chart- hypoglycemia Try to get outside daily and walk Please start checking blood sugar again  Follow Up Plan: Telephone follow up appointment with care management team member scheduled for: 07/12/21 at 1045 am          Plan:Telephone follow up appointment with care management team member scheduled for:  07/12/21 at 1045   Irving Shows [facility], BSN RN Case Manager [facility] [phone]

## 2022-05-17 NOTE — Telephone Encounter (Signed)
PATIENT CALLED IN TO RESCHEDULE HER PROCEDURE. Moved to 12/6 at 2:30pm.

## 2022-05-17 NOTE — Patient Instructions (Signed)
Visit Information  Following are the goals we discussed today:  Current Barriers:  Unable to independently afford treatment regimen Suboptimal therapeutic regimen for COPD   Pharmacist Clinical Goal(s):  patient will verbalize ability to afford treatment regimen maintain control of COPD as evidenced by IMPROVED QOL/BREATHING/LESS RESCUR YSE  through collaboration with PharmD and provider.    Interventions: 1:1 collaboration with Raliegh Ip, DO regarding development and update of comprehensive plan of care as evidenced by provider attestation and co-signature Inter-disciplinary care team collaboration (see longitudinal plan of care) Comprehensive medication review performed; medication list updated in electronic medical record  Chronic Obstructive Pulmonary Disease:  New goal. Uncontrolled; current treatment:ANORO;  Will plan to transition patient to [facility] for triple therapy Patient declined cessation--will continue to explore Diabetes controlled, but will continue to address 43 pack year history (smokes 1ppd since age 66) [place] quit line info given Recommended start Breztri Assessed patient finances. Will file application for Breztri triple inhaler [place] BP controlled upon recheck x3   Patient Goals/Self-Care Activities patient will:  - take medications as prescribed as evidenced by patient report and record review collaborate with provider on medication access solutions   Plan: Telephone follow up appointment with care management team member scheduled for:  3 MONTHS  Signature Kieth Brightly, PharmD, BCPS Clinical Pharmacist, [facility]  II Phone [phone]   Please call the care guide team at [phone] if you need to cancel or reschedule your appointment.   Patient verbalizes understanding of instructions and care plan provided today and agrees to view in MyChart. Active MyChart  status and patient understanding of how to access instructions and care plan via MyChart confirmed with patient.

## 2022-05-17 NOTE — Plan of Care (Signed)
Chronic Care Management Provider Comprehensive Care Plan    05/17/2022 Name: Nancy Thornton MRN: [id number] DOB: [birth year]  Referral to Chronic Care Management (CCM) services was placed by Provider:  Delynn Flavin DO on Date: 03/15/22.  Chronic Condition 1: DIABETES Provider Assessment and Plan  Type 2 diabetes mellitus with hyperglycemia, without long-term current use of insulin (HCC) - Plan: [facility] DCA Hb A1c Waived, CT CARDIAC SCORING (SELF PAY ONLY), AMB Referral to Chronic Care Management Services   Expected Outcome/Goals Addressed This Visit (Provider CCM goals/Provider Assessment and plan  CCM (DIABETES) EXPECTED OUTCOME:  MONITOR, SELF-MANAGE AND REDUCE SYMPTOMS OF DIABETES  Symptom Management Condition 1: Take medications as prescribed   Attend all scheduled provider appointments Call pharmacy for medication refills 3-7 days in advance of running out of medications Attend church or other social activities Perform all self care activities independently  Perform IADL's (shopping, preparing meals, housekeeping, managing finances) independently Call provider office for new concerns or questions  check blood sugar at prescribed times: per MD order  check feet daily for cuts, sores or redness enter blood sugar readings and medication or insulin into daily log take the blood sugar log to all doctor visits take the blood sugar meter to all doctor visits trim toenails straight across drink 6 to 8 glasses of water each day fill half of plate with vegetables read food labels for fat, fiber, carbohydrates and portion size Look over education sent via My Chart- hypoglycemia Try to get outside daily and walk Please start checking blood sugar again  Chronic Condition 2: COPD Provider Assessment and Plan  Simple chronic bronchitis (HCC) - Plan: AMB Referral to Chronic Care Management Services   Expected Outcome/Goals Addressed This Visit (Provider CCM goals/Provider Assessment and  plan  CCM (COPD) EXPECTED OUTCOME: MONITOR, SELF-MANAGE AND REDUCE SYMPTOMS OF COPD  Symptom Management Condition 2: Take medications as prescribed   Attend all scheduled provider appointments Call pharmacy for medication refills 3-7 days in advance of running out of medications Attend church or other social activities Perform all self care activities independently  Perform IADL's (shopping, preparing meals, housekeeping, managing finances) independently Call provider office for new concerns or questions  eliminate smoking in my home identify and remove indoor air pollutants limit outdoor activity during cold weather listen for public air quality announcements every day do breathing exercises every day develop a rescue plan follow rescue plan if symptoms flare-up get at least 7 to 8 hours of sleep at night practice relaxation or meditation daily Practice good handwashing, wear a mask as needed Look over and complete Advanced directives packet  Problem List Patient Active Problem List   Diagnosis Date Noted   Weight loss, unintentional 05/16/2022   Chronic diarrhea 01/09/2020   Fatty liver 01/09/2020   Back pain with history of spinal surgery 04/12/2019   S/P lumbar fusion 03/06/2019   Tobacco use 07/10/2018   Obesity 07/10/2018   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   History of arthroscopy of knee 09/26/2017   Complex tear of medial meniscus of left knee as current injury 08/29/2017   Acute lateral meniscus tear of left knee 08/15/2017   Simple chronic bronchitis (HCC) 10/28/2014   Hypertension associated with diabetes (HCC) 10/19/2012   Type 2 diabetes mellitus with hyperglycemia, without long-term current use of insulin (HCC) 10/19/2012   Hyperlipidemia associated with type 2 diabetes mellitus (HCC) 10/19/2012    Medication Management  Current Outpatient Medications:    albuterol (VENTOLIN HFA) 108 (90 Base) MCG/ACT inhaler, INHALE  2 PUFFS BY MOUTH EVERY 6 HOURS AS NEEDED  FOR WHEEZING AND FOR SHORTNESS OF BREATH, Disp: 9 g, Rfl: 0   atenolol (TENORMIN) 50 MG tablet, Take 1 tablet (50 mg total) by mouth daily., Disp: 90 tablet, Rfl: 3   atorvastatin (LIPITOR) 20 MG tablet, Take 1 tablet (20 mg total) by mouth daily., Disp: 90 tablet, Rfl: 3   cyclobenzaprine (FLEXERIL) 10 MG tablet, Take 1 tablet (10 mg total) by mouth 3 (three) times daily as needed for muscle spasms. (Patient taking differently: Take 10 mg by mouth at bedtime.), Disp: 30 tablet, Rfl: 1   gabapentin (NEURONTIN) 300 MG capsule, Take 1 capsule (300 mg total) by mouth 2 (two) times daily., Disp: 180 capsule, Rfl: 3   lisinopril (ZESTRIL) 20 MG tablet, Take 1 tablet (20 mg total) by mouth daily., Disp: 90 tablet, Rfl: 3   Melatonin 5 MG TABS, Take 5 mg by mouth at bedtime., Disp: , Rfl:    Omega-3 Fatty Acids (FISH OIL) 1000 MG CAPS, Take 1,000 mg by mouth daily., Disp: , Rfl:    umeclidinium-vilanterol (ANORO ELLIPTA) 62.5-25 MCG/INH AEPB, Inhale 1 puff into the lungs daily., Disp: 28 each, Rfl: 0  Cognitive Assessment Identity Confirmed: : Name; DOB Cognitive Status: Normal   Functional Assessment Hearing Difficulty or Deaf: no Wear Glasses or Blind: yes Vision Management: can see well w/ glasses Concentrating, Remembering or Making Decisions Difficulty (CP): no Difficulty Communicating: no Difficulty Eating/Swallowing: no Walking or Climbing Stairs Difficulty: no Dressing/Bathing Difficulty: no Doing Errands Independently Difficulty (such as shopping) (CP): no   Caregiver Assessment  Primary Source of Support/Comfort: child(ren) Name of Support/Comfort Primary Source: adult daughter People in Home: alone   Planned Interventions  Provided education to patient about basic DM disease process; Reviewed medications with patient and discussed importance of medication adherence;        Reviewed prescribed diet with patient carbohydrate modified; Discussed plans with patient for ongoing  care management follow up and provided patient with direct contact information for care management team;      Provided patient with written educational materials related to hypo and hyperglycemia and importance of correct treatment;       Advised patient, providing education and rationale, to check cbg per MD order  and record        Review of patient status, including review of consultants reports, relevant laboratory and other test results, and medications completed;       Screening for signs and symptoms of depression related to chronic disease state;        Assessed social determinant of health barriers;        Reviewed importance of exercise and getting outdoors daily if able/ weather permitting Provided patient with basic written and verbal COPD education on self care/management/and exacerbation prevention Advised patient to track and manage COPD triggers Provided instruction about proper use of medications used for management of COPD including inhalers Advised patient to self assesses COPD action plan zone and make appointment with provider if in the yellow zone for 48 hours without improvement Advised patient to engage in light exercise as tolerated 3-5 days a week to aid in the the management of COPD Provided education about and advised patient to utilize infection prevention strategies to reduce risk of respiratory infection Screening for signs and symptoms of depression related to chronic disease state  Assessed social determinant of health barriers Advanced directives packet mailed  Interaction and coordination with outside resources, practitioners, and providers See CCM  Referral  Care Plan: Available in MyChart

## 2022-05-31 ENCOUNTER — Other Ambulatory Visit: Payer: Self-pay | Admitting: Family Medicine

## 2022-05-31 ENCOUNTER — Ambulatory Visit (INDEPENDENT_AMBULATORY_CARE_PROVIDER_SITE_OTHER): Payer: Medicare Other | Admitting: *Deleted

## 2022-05-31 VITALS — BP 150/90 | HR 69

## 2022-05-31 DIAGNOSIS — I152 Hypertension secondary to endocrine disorders: Secondary | ICD-10-CM

## 2022-05-31 MED ORDER — AMLODIPINE BESYLATE 5 MG PO TABS: 5.0000 mg | ORAL_TABLET | Freq: Every day | ORAL | 3 refills | Status: DC

## 2022-06-02 DIAGNOSIS — F17210 Nicotine dependence, cigarettes, uncomplicated: Secondary | ICD-10-CM

## 2022-06-02 DIAGNOSIS — J449 Chronic obstructive pulmonary disease, unspecified: Secondary | ICD-10-CM

## 2022-06-02 DIAGNOSIS — E1159 Type 2 diabetes mellitus with other circulatory complications: Secondary | ICD-10-CM | POA: Diagnosis not present

## 2022-06-02 NOTE — Progress Notes (Signed)
Left detailed message, encouraged cal back if there were any questions

## 2022-06-08 ENCOUNTER — Ambulatory Visit (HOSPITAL_COMMUNITY): Payer: Medicare Other | Admitting: Anesthesiology

## 2022-06-08 ENCOUNTER — Encounter (HOSPITAL_COMMUNITY): Payer: Self-pay | Admitting: Gastroenterology

## 2022-06-08 ENCOUNTER — Other Ambulatory Visit: Payer: Self-pay

## 2022-06-08 ENCOUNTER — Encounter (HOSPITAL_COMMUNITY)
Admission: RE | Disposition: A | Discharge status: Home / Self Care | Payer: Self-pay | Source: Home / Self Care | Attending: Gastroenterology

## 2022-06-08 ENCOUNTER — Ambulatory Visit (HOSPITAL_BASED_OUTPATIENT_CLINIC_OR_DEPARTMENT_OTHER): Payer: Medicare Other | Admitting: Anesthesiology

## 2022-06-08 ENCOUNTER — Ambulatory Visit (HOSPITAL_COMMUNITY)
Admission: RE | Admit: 2022-06-08 | Discharge: 2022-06-08 | Disposition: A | Discharge status: Home / Self Care | Payer: Medicare Other | Attending: Gastroenterology | Admitting: Gastroenterology

## 2022-06-08 DIAGNOSIS — J45909 Unspecified asthma, uncomplicated: Secondary | ICD-10-CM | POA: Insufficient documentation

## 2022-06-08 DIAGNOSIS — I10 Essential (primary) hypertension: Secondary | ICD-10-CM | POA: Insufficient documentation

## 2022-06-08 DIAGNOSIS — E669 Obesity, unspecified: Secondary | ICD-10-CM | POA: Diagnosis not present

## 2022-06-08 DIAGNOSIS — K760 Fatty (change of) liver, not elsewhere classified: Secondary | ICD-10-CM | POA: Diagnosis not present

## 2022-06-08 DIAGNOSIS — E119 Type 2 diabetes mellitus without complications: Secondary | ICD-10-CM | POA: Diagnosis not present

## 2022-06-08 DIAGNOSIS — F17210 Nicotine dependence, cigarettes, uncomplicated: Secondary | ICD-10-CM | POA: Insufficient documentation

## 2022-06-08 DIAGNOSIS — R634 Abnormal weight loss: Secondary | ICD-10-CM | POA: Insufficient documentation

## 2022-06-08 DIAGNOSIS — Z6825 Body mass index (BMI) 25.0-25.9, adult: Secondary | ICD-10-CM | POA: Diagnosis not present

## 2022-06-08 HISTORY — PX: ESOPHAGOGASTRODUODENOSCOPY (EGD) WITH PROPOFOL: SHX5813

## 2022-06-08 LAB — GLUCOSE, CAPILLARY: Glucose-Capillary: 137 mg/dL — ABNORMAL HIGH (ref 70–99)

## 2022-06-08 SURGERY — ESOPHAGOGASTRODUODENOSCOPY (EGD) WITH PROPOFOL
Anesthesia: General

## 2022-06-08 MED ORDER — PROPOFOL 500 MG/50ML IV EMUL
INTRAVENOUS | Status: AC
  Filled 2022-06-08: qty 50

## 2022-06-08 MED ORDER — PROPOFOL 10 MG/ML IV BOLUS
INTRAVENOUS | Status: DC | PRN
  Administered 2022-06-08: 80 mg via INTRAVENOUS

## 2022-06-08 MED ORDER — LACTATED RINGERS IV SOLN: INTRAVENOUS | Status: DC

## 2022-06-08 MED ORDER — PROPOFOL 500 MG/50ML IV EMUL
INTRAVENOUS | Status: DC | PRN
  Administered 2022-06-08: 180 ug/kg/min via INTRAVENOUS

## 2022-06-08 NOTE — Interval H&P Note (Signed)
History and Physical Interval Note:  06/08/2022 11:50 AM  Nancy Thornton  has presented today for surgery, with the diagnosis of unintentional weight loss.  The various methods of treatment have been discussed with the patient and family. After consideration of risks, benefits and other options for treatment, the patient has consented to  Procedure(s) with comments: ESOPHAGOGASTRODUODENOSCOPY (EGD) WITH PROPOFOL (N/A) - 2:30PM as a surgical intervention.  The patient's history has been reviewed, patient examined, no change in status, stable for surgery.  I have reviewed the patient's chart and labs.  Questions were answered to the patient's satisfaction.     Katrinka Blazing Mayorga

## 2022-06-08 NOTE — Discharge Instructions (Signed)
You are being discharged to home.  Resume your previous diet.  Proceed with scheduled CT abdomen and pelvis.

## 2022-06-08 NOTE — Anesthesia Postprocedure Evaluation (Signed)
Anesthesia Post Note  Patient: Nancy Thornton  Procedure(s) Performed: ESOPHAGOGASTRODUODENOSCOPY (EGD) WITH PROPOFOL  Patient location during evaluation: Phase II Anesthesia Type: General Level of consciousness: awake and alert and oriented Pain management: pain level controlled Vital Signs Assessment: post-procedure vital signs reviewed and stable Respiratory status: spontaneous breathing, nonlabored ventilation and respiratory function stable Cardiovascular status: blood pressure returned to baseline and stable Postop Assessment: no apparent nausea or vomiting Anesthetic complications: no  No notable events documented.   Last Vitals:  Vitals:   06/08/22 1159 06/08/22 1241  BP: 131/73 99/73  Pulse: (!) 51 61  Resp: 16 18  Temp: 36.6 C (!) 36.3 C  SpO2: 100% 99%    Last Pain:  Vitals:   06/08/22 1241  TempSrc: Oral  PainSc: 0-No pain                 Gal Smolinski C Cohen Doleman

## 2022-06-08 NOTE — Anesthesia Procedure Notes (Signed)
Date/Time: 06/08/2022 12:26 PM  Performed by: Cy Blamer, CRNAPre-anesthesia Checklist: Patient identified, Emergency Drugs available, Suction available, Patient being monitored and Timeout performed Patient Re-evaluated:Patient Re-evaluated prior to induction Oxygen Delivery Method: Nasal cannula Preoxygenation: Pre-oxygenation with 100% oxygen Placement Confirmation: positive ETCO2 Dental Injury: Teeth and Oropharynx as per pre-operative assessment

## 2022-06-08 NOTE — Anesthesia Preprocedure Evaluation (Addendum)
Anesthesia Evaluation  Patient identified by MRN, date of birth, ID band Patient awake    Reviewed: Allergy & Precautions, H&P , NPO status , Patient's Chart, lab work & pertinent test results  History of Anesthesia Complications (+) history of anesthetic complications (cardiac arrest)  Airway Mallampati: III  TM Distance: >3 FB Neck ROM: Full    Dental  (+) Dental Advisory Given, Chipped   Pulmonary asthma , Current Smoker   Pulmonary exam normal breath sounds clear to auscultation       Cardiovascular hypertension, Pt. on medications Normal cardiovascular exam Rhythm:Regular Rate:Normal     Neuro/Psych  PSYCHIATRIC DISORDERS  Depression    negative neurological ROS     GI/Hepatic negative GI ROS,,,(+)     substance abuse  alcohol use  Endo/Other  diabetes, Well Controlled, Type 2, Oral Hypoglycemic Agents    Renal/GU negative Renal ROS  negative genitourinary   Musculoskeletal negative musculoskeletal ROS (+)    Abdominal   Peds negative pediatric ROS (+)  Hematology negative hematology ROS (+)   Anesthesia Other Findings   Reproductive/Obstetrics negative OB ROS                             Anesthesia Physical Anesthesia Plan  ASA: 2  Anesthesia Plan: General   Post-op Pain Management: Minimal or no pain anticipated   Induction: Intravenous  PONV Risk Score and Plan: Propofol infusion  Airway Management Planned: Nasal Cannula and Natural Airway  Additional Equipment:   Intra-op Plan:   Post-operative Plan:   Informed Consent: I have reviewed the patients History and Physical, chart, labs and discussed the procedure including the risks, benefits and alternatives for the proposed anesthesia with the patient or authorized representative who has indicated his/her understanding and acceptance.     Dental advisory given  Plan Discussed with: CRNA and  Surgeon  Anesthesia Plan Comments:        Anesthesia Quick Evaluation

## 2022-06-08 NOTE — Op Note (Signed)
[facility] Patient Name: Nancy Thornton Procedure Date: 06/08/2022 11:48 AM MRN: [id number] Date of Birth: 02-12-1961 Attending MD: Katrinka Blazing , , [id number] CSN: [id number] Age: 61 Admit Type: Outpatient Procedure:                Upper GI endoscopy Indications:              Weight loss Providers:                Katrinka Blazing, Buel Ream. Thomasena Edis RN, RN, Lennice Sites Technician, Pensions consultant Referring MD:              Medicines:                Monitored Anesthesia Care Complications:            No immediate complications. Estimated Blood Loss:     Estimated blood loss: none. Procedure:                Pre-Anesthesia Assessment:                           - Prior to the procedure, a History and Physical                            was performed, and patient medications, allergies                            and sensitivities were reviewed. The patient's                            tolerance of previous anesthesia was reviewed.                           - The risks and benefits of the procedure and the                            sedation options and risks were discussed with the                            patient. All questions were answered and informed                            consent was obtained.                           - ASA Grade Assessment: II - A patient with mild                            systemic disease.                           After obtaining informed consent, the endoscope was                            passed under direct vision. Throughout the  procedure, the patient's blood pressure, pulse, and                            oxygen saturations were monitored continuously. The                            [id number]-H190 ([id number]) scope was introduced through the                            mouth, and advanced to the second part of duodenum.                            The upper GI endoscopy was accomplished without                             difficulty. The patient tolerated the procedure                            well. Scope In: 12:30:15 PM Scope Out: 12:36:01 PM Total Procedure Duration: 0 hours 5 minutes 46 seconds  Findings:      The examined esophagus was normal.      The Z-line was regular and was found 40 cm from the incisors.      The stomach was normal.      The examined duodenum was normal. Impression:               - Normal esophagus.                           - Z-line regular, 40 cm from the incisors.                           - Normal stomach.                           - Normal examined duodenum.                           - No specimens collected. Moderate Sedation:      Per Anesthesia Care Recommendation:           - Discharge patient to home (ambulatory).                           - Resume previous diet.                           - Proceed with scheduled CT abdomen and pelvis. Procedure Code(s):        --- Professional ---                           [phone], Esophagogastroduodenoscopy, flexible,                            transoral; diagnostic, including collection of  specimen(s) by brushing or washing, when performed                            (separate procedure) Diagnosis Code(s):        --- Professional ---                           R63.4, Abnormal weight loss CPT copyright 2022 American Medical Association. All rights reserved. The codes documented in this report are preliminary and upon coder review may  be revised to meet current compliance requirements. Katrinka Blazing, MD Katrinka Blazing,  06/08/2022 12:39:05 PM This report has been signed electronically. Number of Addenda: 0

## 2022-06-08 NOTE — Transfer of Care (Signed)
Immediate Anesthesia Transfer of Care Note  Patient: Nancy Thornton  Procedure(s) Performed: ESOPHAGOGASTRODUODENOSCOPY (EGD) WITH PROPOFOL  Patient Location: PACU  Anesthesia Type:General  Level of Consciousness: awake, alert , and oriented  Airway & Oxygen Therapy: Patient Spontanous Breathing  Post-op Assessment: Report given to RN, Post -op Vital signs reviewed and stable, Patient moving all extremities X 4, and Patient able to stick tongue midline  Post vital signs: Reviewed  Last Vitals:  Vitals Value Taken Time  BP 99/73 06/08/22 1241  Temp 36.3 C 06/08/22 1241  Pulse 61 06/08/22 1241  Resp 18 06/08/22 1241  SpO2 99 % 06/08/22 1241    Last Pain:  Vitals:   06/08/22 1241  TempSrc: Oral  PainSc: 0-No pain         Complications: No notable events documented.

## 2022-06-15 ENCOUNTER — Encounter: Payer: Self-pay | Admitting: Nurse Practitioner

## 2022-06-15 ENCOUNTER — Ambulatory Visit (INDEPENDENT_AMBULATORY_CARE_PROVIDER_SITE_OTHER): Payer: Medicare Other | Admitting: Nurse Practitioner

## 2022-06-15 VITALS — BP 150/101 | HR 74 | Temp 98.1°F | Ht 65.5 in | Wt 144.0 lb

## 2022-06-15 DIAGNOSIS — M9903 Segmental and somatic dysfunction of lumbar region: Secondary | ICD-10-CM | POA: Diagnosis not present

## 2022-06-15 DIAGNOSIS — M62830 Muscle spasm of back: Secondary | ICD-10-CM | POA: Diagnosis not present

## 2022-06-15 DIAGNOSIS — H60332 Swimmer's ear, left ear: Secondary | ICD-10-CM | POA: Diagnosis not present

## 2022-06-15 DIAGNOSIS — M9902 Segmental and somatic dysfunction of thoracic region: Secondary | ICD-10-CM | POA: Diagnosis not present

## 2022-06-15 DIAGNOSIS — M9901 Segmental and somatic dysfunction of cervical region: Secondary | ICD-10-CM | POA: Diagnosis not present

## 2022-06-15 MED ORDER — NEOMYCIN-POLYMYXIN-HC 3.5-10000-1 OT SOLN: 4.0000 [drp] | Freq: Four times a day (QID) | OTIC | 0 refills | Status: DC

## 2022-06-15 MED ORDER — PREDNISONE 20 MG PO TABS: 20.0000 mg | ORAL_TABLET | Freq: Every day | ORAL | 0 refills | Status: DC

## 2022-06-15 NOTE — Progress Notes (Unsigned)
di  Acute Office Visit  Subjective:     Patient ID: Nancy Thornton, female    DOB: 11-09-1960, 61 y.o.   MRN: [id number]  Chief Complaint  Patient presents with   knot on neck    HPI Patient is in today for ***  ROS      Objective:    BP (!) 143/81 (BP Location: Right Arm)   Pulse 74   Temp 98.1 F (36.7 C)   Ht 5' 5.5" (1.664 m)   Wt 144 lb (65.3 kg)   SpO2 97%   BMI 23.60 kg/m  {Vitals History (Optional):23777}  Physical Exam  No results found for any visits on 06/15/22.      Assessment & Plan:   Problem List Items Addressed This Visit   None   No orders of the defined types were placed in this encounter.   No follow-ups on file.  Daryll Drown, NP

## 2022-06-15 NOTE — Patient Instructions (Signed)
Otitis Media, Adult  Otitis media is a condition in which the middle ear is red and swollen (inflamed) and full of fluid. The middle ear is the part of the ear that contains bones for hearing as well as air that helps send sounds to the brain. The condition usually goes away on its own. What are the causes? This condition is caused by a blockage in the eustachian tube. This tube connects the middle ear to the back of the nose. It normally allows air into the middle ear. The blockage is caused by fluid or swelling. Problems that can cause blockage include: A cold or infection that affects the nose, mouth, or throat. Allergies. An irritant, such as tobacco smoke. Adenoids that have become large. The adenoids are soft tissue located in the back of the throat, behind the nose and the roof of the mouth. Growth or swelling in the upper part of the throat, just behind the nose (nasopharynx). Damage to the ear caused by a change in pressure. This is called barotrauma. What increases the risk? You are more likely to develop this condition if you: Smoke or are exposed to tobacco smoke. Have an opening in the roof of your mouth (cleft palate). Have acid reflux. Have problems in your body's defense system (immune system). What are the signs or symptoms? Symptoms of this condition include: Ear pain. Fever. Problems with hearing. Being tired. Fluid leaking from the ear. Ringing in the ear. How is this treated? This condition can go away on its own within 3-5 days. But if the condition is caused by germs (bacteria) and does not go away on its own, or if it keeps coming back, your doctor may: Give you antibiotic medicines. Give you medicines for pain. Follow these instructions at home: Take over-the-counter and prescription medicines only as told by your doctor. If you were prescribed an antibiotic medicine, take it as told by your doctor. Do not stop taking it even if you start to feel better. Keep  all follow-up visits. Contact a doctor if: You have bleeding from your nose. There is a lump on your neck. You are not feeling better in 5 days. You feel worse instead of better. Get help right away if: You have pain that is not helped with medicine. You have swelling, redness, or pain around your ear. You get a stiff neck. You cannot move part of your face (paralysis). You notice that the bone behind your ear hurts when you touch it. You get a very bad headache. Summary Otitis media means that the middle ear is red, swollen, and full of fluid. This condition usually goes away on its own. If the problem does not go away, treatment may be needed. You may be given medicines to treat the infection or to treat your pain. If you were prescribed an antibiotic medicine, take it as told by your doctor. Do not stop taking it even if you start to feel better. Keep all follow-up visits. This information is not intended to replace advice given to you by your health care provider. Make sure you discuss any questions you have with your health care provider. Document Revised: 09/28/2020 Document Reviewed: 09/28/2020 Elsevier Patient Education  2023 Elsevier Inc.  

## 2022-06-16 DIAGNOSIS — M9902 Segmental and somatic dysfunction of thoracic region: Secondary | ICD-10-CM | POA: Diagnosis not present

## 2022-06-16 DIAGNOSIS — M9901 Segmental and somatic dysfunction of cervical region: Secondary | ICD-10-CM | POA: Diagnosis not present

## 2022-06-16 DIAGNOSIS — M62830 Muscle spasm of back: Secondary | ICD-10-CM | POA: Diagnosis not present

## 2022-06-16 DIAGNOSIS — M9903 Segmental and somatic dysfunction of lumbar region: Secondary | ICD-10-CM | POA: Diagnosis not present

## 2022-06-20 DIAGNOSIS — M62830 Muscle spasm of back: Secondary | ICD-10-CM | POA: Diagnosis not present

## 2022-06-20 DIAGNOSIS — M9903 Segmental and somatic dysfunction of lumbar region: Secondary | ICD-10-CM | POA: Diagnosis not present

## 2022-06-20 DIAGNOSIS — M9901 Segmental and somatic dysfunction of cervical region: Secondary | ICD-10-CM | POA: Diagnosis not present

## 2022-06-20 DIAGNOSIS — M9902 Segmental and somatic dysfunction of thoracic region: Secondary | ICD-10-CM | POA: Diagnosis not present

## 2022-06-21 DIAGNOSIS — K1321 Leukoplakia of oral mucosa, including tongue: Secondary | ICD-10-CM | POA: Diagnosis not present

## 2022-06-21 DIAGNOSIS — H93293 Other abnormal auditory perceptions, bilateral: Secondary | ICD-10-CM | POA: Diagnosis not present

## 2022-06-21 DIAGNOSIS — R221 Localized swelling, mass and lump, neck: Secondary | ICD-10-CM | POA: Diagnosis not present

## 2022-06-21 DIAGNOSIS — H6123 Impacted cerumen, bilateral: Secondary | ICD-10-CM | POA: Diagnosis not present

## 2022-06-23 ENCOUNTER — Ambulatory Visit (HOSPITAL_COMMUNITY)
Admission: RE | Admit: 2022-06-23 | Discharge: 2022-06-23 | Disposition: A | Discharge status: Home / Self Care | Payer: Medicare Other | Source: Ambulatory Visit | Attending: Gastroenterology | Admitting: Gastroenterology

## 2022-06-23 DIAGNOSIS — R634 Abnormal weight loss: Secondary | ICD-10-CM | POA: Insufficient documentation

## 2022-06-23 DIAGNOSIS — N281 Cyst of kidney, acquired: Secondary | ICD-10-CM | POA: Diagnosis not present

## 2022-06-23 DIAGNOSIS — K7689 Other specified diseases of liver: Secondary | ICD-10-CM | POA: Diagnosis not present

## 2022-06-23 LAB — POCT I-STAT CREATININE: Creatinine, Ser: 0.8 mg/dL (ref 0.44–1.00)

## 2022-06-23 MED ORDER — IOHEXOL 300 MG/ML  SOLN
100.0000 mL | Freq: Once | INTRAMUSCULAR | Status: AC | PRN
  Administered 2022-06-23: 100 mL via INTRAVENOUS

## 2022-06-28 DIAGNOSIS — H9042 Sensorineural hearing loss, unilateral, left ear, with unrestricted hearing on the contralateral side: Secondary | ICD-10-CM | POA: Diagnosis not present

## 2022-07-06 ENCOUNTER — Ambulatory Visit (INDEPENDENT_AMBULATORY_CARE_PROVIDER_SITE_OTHER): Payer: Medicare Other | Admitting: Family Medicine

## 2022-07-06 ENCOUNTER — Encounter: Payer: Self-pay | Admitting: Family Medicine

## 2022-07-06 VITALS — BP 120/78 | HR 56 | Temp 98.2°F | Ht 65.5 in | Wt 144.6 lb

## 2022-07-06 DIAGNOSIS — E785 Hyperlipidemia, unspecified: Secondary | ICD-10-CM

## 2022-07-06 DIAGNOSIS — K118 Other diseases of salivary glands: Secondary | ICD-10-CM

## 2022-07-06 DIAGNOSIS — I152 Hypertension secondary to endocrine disorders: Secondary | ICD-10-CM

## 2022-07-06 DIAGNOSIS — E1169 Type 2 diabetes mellitus with other specified complication: Secondary | ICD-10-CM

## 2022-07-06 DIAGNOSIS — E1159 Type 2 diabetes mellitus with other circulatory complications: Secondary | ICD-10-CM

## 2022-07-06 DIAGNOSIS — H6992 Unspecified Eustachian tube disorder, left ear: Secondary | ICD-10-CM

## 2022-07-06 DIAGNOSIS — E1165 Type 2 diabetes mellitus with hyperglycemia: Secondary | ICD-10-CM

## 2022-07-06 LAB — BAYER DCA HB A1C WAIVED: HB A1C (BAYER DCA - WAIVED): 7.1 % — ABNORMAL HIGH (ref 4.8–5.6)

## 2022-07-06 MED ORDER — FLUTICASONE PROPIONATE 50 MCG/ACT NA SUSP: 2.0000 | Freq: Every day | NASAL | 6 refills | Status: AC

## 2022-07-06 NOTE — Progress Notes (Signed)
Subjective: CC:DM PCP: Nancy Norlander, DO [id number] Nancy Thornton is a 63 y.o. female presenting to clinic today for:  1. Type 2 Diabetes with hypertension, hyperlipidemia:  Has had diet-controlled diabetes.  Compliant with other medications.  No reports of chest pain, shortness of breath, edema.  She continues to lose weight slightly but has had workup and this has been negative for malignancy.  She reports that her appetite simply is not vigorous as it used to be prior to the diabetes diagnosis  Last eye exam: Up-to-date Last foot exam: Up-to-date Last A1c:  Lab Results  Component Value Date   HGBA1C 7.1 (H) 07/06/2022   Nephropathy screen indicated?:  Needs Last flu, zoster and/or pneumovax:  Immunization History  Administered Date(s) Administered   Influenza, High Dose Seasonal PF 03/30/2018   Influenza, Quadrivalent, Recombinant, Inj, Pf 03/20/2019, 03/14/2022   Influenza, Seasonal, Injecte, Preservative Fre 04/18/2013, 04/12/2014, 03/12/2015, 04/08/2017   Influenza,inj,Quad PF,6+ Mos 04/18/2013, 03/12/2015, 04/08/2017, 03/30/2018   Influenza-Unspecified 04/18/2013, 04/12/2014, 03/12/2015, 03/12/2015, 04/08/2017, 04/08/2017, 03/30/2018, 03/20/2019, 04/25/2020   Moderna Sars-Covid-2 Vaccination 09/20/2019, 10/22/2019, 02/24/2020, 04/25/2020   Pneumococcal Conjugate-13 09/05/2013   Pneumococcal Polysaccharide-23 03/12/2015   Respiratory Syncytial Virus Vaccine,Recomb Aduvanted(Arexvy) 03/14/2022   Td 01/09/2017   Tdap 01/09/2017   Zoster Recombinat (Shingrix) 03/14/2022    2.  Ear fullness, enlarged gland Patient has been seen by ear nose and throat for both of these issues.  She was prescribed Flonase but notes that this was sent to mail order so she did not get it because she does not want to utilize mail order.  She has not been successful with getting it redirected to her local pharmacy.  She is status post treatment for otitis by one of my partners as well but symptoms  have really not resolved and she often has to put pressure on her eardrum in efforts to pop it.  She continues to have a slightly enlarged gland at the left neck and this was evaluated by ENT as well.  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    Current Outpatient Medications:    acetaminophen (TYLENOL) 500 MG tablet, Take 1,000 mg by mouth at bedtime., Disp: , Rfl:    albuterol (VENTOLIN HFA) 108 (90 Base) MCG/ACT inhaler, INHALE 2 PUFFS BY MOUTH EVERY 6 HOURS AS NEEDED FOR WHEEZING AND FOR SHORTNESS OF BREATH (Patient taking differently: Inhale 2 puffs into the lungs at bedtime.), Disp: 9 g, Rfl: 0   amLODipine (NORVASC) 5 MG tablet, Take 1 tablet (5 mg total) by mouth daily., Disp: 90 tablet, Rfl: 3   atenolol (TENORMIN) 50 MG tablet, Take 1 tablet (50 mg total) by mouth daily., Disp: 90 tablet, Rfl: 3   atorvastatin (LIPITOR) 20 MG tablet, Take 1 tablet (20 mg total) by mouth daily., Disp: 90 tablet, Rfl: 3   diphenhydrAMINE (BENADRYL) 25 MG tablet,  Take 25 mg by mouth at bedtime., Disp: , Rfl:    gabapentin (NEURONTIN) 300 MG capsule, Take 1 capsule (300 mg total) by mouth 2 (two) times daily., Disp: 180 capsule, Rfl: 3   lisinopril (ZESTRIL) 20 MG tablet, Take 1 tablet (20 mg total) by mouth daily., Disp: 90 tablet, Rfl: 3   Melatonin 10 MG TABS, Take 10 mg by mouth at bedtime., Disp: , Rfl:    Omega 3-6-9 Fatty Acids (TRIPLE OMEGA COMPLEX PO), Take 1 capsule by  mouth daily., Disp: , Rfl:    umeclidinium-vilanterol (ANORO ELLIPTA) 62.5-25 MCG/INH AEPB, Inhale 1 puff into the lungs daily., Disp: 28 each, Rfl: 0 Social History   Socioeconomic History   Marital status: Divorced    Spouse name: Not on file   Number of children: 1   Years of education: Not on file   Highest education level: Not on file  Occupational History   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  Tobacco Use   Smoking status: Every Day    Packs/day: 0.50    Types: Cigarettes    Start date: 10/18/1977    Last attempt to quit: 02/10/2019    Years since quitting: 3.4   Smokeless tobacco: Never  Vaping Use   Vaping Use: Never used  Substance and Sexual Activity   Alcohol use: No   Drug use: No   Sexual activity: Not Currently  Other Topics Concern   Not on file  Social History Narrative   Not on file   Social Determinants of Health   Financial Resource Strain: Low Risk  (05/17/2022)   Overall Financial Resource Strain (CARDIA)    Difficulty of Paying Living Expenses: Not hard at all  Food Insecurity: No Food Insecurity (05/17/2022)   Hunger Vital Sign    Worried About Running Out of Food in the Last Year: Never true    [place] in the Last Year: Never true  Transportation Needs: No Transportation Needs (05/17/2022)   PRAPARE - Hydrologist (Medical): No    Lack of Transportation (Non-Medical): No  Physical Activity: Inactive (05/17/2022)   Exercise Vital Sign    Days of Exercise per Week: 0 days    Minutes of Exercise per Session: 0 min  Stress: No Stress Concern Present (05/17/2022)   [place]    Feeling of Stress : Not at all  Social Connections: Moderately Integrated (05/17/2022)   Social Connection and Isolation Panel [NHANES]    Frequency of Communication with Friends and Family: Three times a week    Frequency of Social Gatherings with Friends  and Family: Three times a week    Attends Religious Services: More than 4 times per year    Active Member of Clubs or Organizations: No    Attends Music therapist: More than 4 times per year    Marital Status: Divorced  Human resources officer Violence: Not on file   Family History  Problem Relation Age of Onset   Diabetes Mother    Hypertension Father    Colon cancer Neg Hx    Esophageal cancer Neg Hx    Rectal cancer Neg Hx    Stomach cancer Neg Hx    Breast cancer Neg Hx     Objective: Office vital signs reviewed. BP 120/78   Pulse (!) 56   Temp 98.2 F (36.8 C)   Ht 5' 5.5" (1.664 m)  Wt 144 lb 9.6 oz (65.6 kg)   SpO2 97%   BMI 23.70 kg/m   Physical Examination:  General: Awake, alert, well nourished, No acute distress HEENT: Mild fullness noted along the left submandibular gland.  No overt lymphadenopathy appreciated Cardio: regular rate and rhythm, S1S2 heard, no murmurs appreciated Pulm: Mild expiratory wheezes noted throughout.  Normal work of breathing on room air  Assessment/ Plan: 62 y.o. female   Type 2 diabetes mellitus with hyperglycemia, without long-term current use of insulin ([place]) - Plan: Bayer DCA Hb A1c Waived, CMP14+EGFR, CANCELED: Bayer DCA Hb A1c Waived  Hyperlipidemia associated with type 2 diabetes mellitus ([place]) - Plan: TSH  Hypertension associated with diabetes ([place])  Eustachian tube dysfunction, left - Plan: fluticasone (FLONASE) 50 MCG/ACT nasal spray  Fullness of submandibular gland  Sugar now uncontrolled A1c rising to 7.1.  I do think this is related to recent prednisone use and holidays.  She will cut back on sugary foods between now and her next visit in 3 months.  She will continue her amlodipine, lisinopril, atenolol and statin.  Blood pressure was well-controlled today  She continues to have some eustachian tube dysfunction on the left.  Did not get Flonase that was prescribed from the ENT, from the mail order as she  "does not want to deal with mail order" and needed this redirected to [facility].  I have redirected this prescription to the [facility]  I agree that she has some fullness of the submandibular gland.  We discussed that this may be related to possible salivary stones?  She has an ultrasound that has been ordered by ENT but I do not see any results for this yet.  Orders Placed This Encounter  Procedures   Bayer DCA Hb A1c Waived   TSH   CMP14+EGFR   Bayer DCA Hb A1c Waived   No orders of the defined types were placed in this encounter.    Nancy Norlander, DO [place] [phone]

## 2022-07-07 LAB — CMP14+EGFR
ALT: 29 IU/L (ref 0–32)
AST: 21 IU/L (ref 0–40)
Albumin/Globulin Ratio: 3.5 — ABNORMAL HIGH (ref 1.2–2.2)
Albumin: 4.6 g/dL (ref 3.9–4.9)
Alkaline Phosphatase: 68 IU/L (ref 44–121)
BUN/Creatinine Ratio: 17 (ref 12–28)
BUN: 14 mg/dL (ref 8–27)
Bilirubin Total: 0.9 mg/dL (ref 0.0–1.2)
CO2: 23 mmol/L (ref 20–29)
Calcium: 9.7 mg/dL (ref 8.7–10.3)
Chloride: 100 mmol/L (ref 96–106)
Creatinine, Ser: 0.84 mg/dL (ref 0.57–1.00)
Globulin, Total: 1.3 g/dL — ABNORMAL LOW (ref 1.5–4.5)
Glucose: 197 mg/dL — ABNORMAL HIGH (ref 70–99)
Potassium: 5.2 mmol/L (ref 3.5–5.2)
Sodium: 139 mmol/L (ref 134–144)
Total Protein: 5.9 g/dL — ABNORMAL LOW (ref 6.0–8.5)
eGFR: 79 mL/min/{1.73_m2} (ref >59)

## 2022-07-07 LAB — TSH: TSH: 1.38 u[IU]/mL (ref 0.450–4.500)

## 2022-07-11 NOTE — Progress Notes (Unsigned)
Cardiology Office Note    Date:  07/12/2022   ID:  Nancy Thornton, DOB Feb 17, 1961, MRN [id number]  PCP:  Raliegh Ip, DO  Cardiologist: Dietrich Pates, MD    F/U of HTN    History of Present Illness:    Nancy Thornton is a 62 y.o. female with past medical history of HTN, HLD, Type II DM former tob use   I saw the pt initially in June 2019 for CP  Pain pleuritic,noncardiac  She did have atherosclerosis on CT scan and lipitor was started  The pt has had dizziness wit hstanding in the past   No syncope I saw the pt in clinic in Jun 2022 Since then the  pt lost wt in early 2023  Initially came off of BP meds   Then went back on them when BP came back up    She had some dizziness in the fall but this has since resolved     The death of the patient's brother lead to her getting a Ca calcium score     Ca score showed atherosclerosis   Ca score was 421 (97th percentile)  Since the fall her dizziness has gotten better   She denies  CP    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       Diet Br:   Bowl of special K with fruit   2 pieces of toast  9 or 10 Dinner  3 to 5  Piece of ham steak  Beats   Peaches    Water  Milk   Whole mild 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    Past Surgical History:  Procedure Laterality Date   ANTERIOR LUMBAR FUSION N/A 03/06/2019   Procedure: L4-5 XLIF, L4-5 PSF, LEFT SYNOVIAL CYST RESECTION L4-5;  Surgeon: Venetia Night, MD;  Location: [facility];  Service: Neurosurgery;  Laterality: N/A;   BACK SURGERY     BIOPSY  01/28/2020   Procedure: BIOPSY;  Surgeon: Dolores Frame, MD;  Location: [facility];  Service: Gastroenterology;;   CHOLECYSTECTOMY      COLONOSCOPY WITH PROPOFOL N/A 01/28/2020   Procedure: COLONOSCOPY WITH PROPOFOL;  Surgeon: Dolores Frame, MD;  Location: [facility];  Service: Gastroenterology;  Laterality: N/A;  915   CYSTOSTOMY W/ BLADDER BIOPSY  1990   punctured hole in bladder, had open procedure to repair   ESOPHAGOGASTRODUODENOSCOPY (EGD) WITH PROPOFOL N/A 06/08/2022   Procedure: ESOPHAGOGASTRODUODENOSCOPY (EGD) WITH PROPOFOL;  Surgeon: Dolores Frame, MD;  Location: [facility];  Service: Gastroenterology;  Laterality: N/A;  2:30PM   MENISCUS REPAIR     TONSILLECTOMY     TOTAL VAGINAL HYSTERECTOMY     62 y/o. bleeding. ovaries left in situ    Current Medications: Outpatient Medications Prior to Visit  Medication Sig Dispense Refill   acetaminophen (TYLENOL) 500 MG tablet Take 1,000 mg by mouth at bedtime.     albuterol (VENTOLIN HFA) 108 (90 Base) MCG/ACT inhaler INHALE 2 PUFFS BY MOUTH EVERY 6 HOURS AS NEEDED FOR WHEEZING AND FOR SHORTNESS OF BREATH (Patient taking differently: Inhale 2  puffs into the lungs at bedtime.) 9 g 0   amLODipine (NORVASC) 5 MG tablet Take 1 tablet (5 mg total) by mouth daily. 90 tablet 3   atenolol (TENORMIN) 50 MG tablet Take 1 tablet (50 mg total) by mouth daily. 90 tablet 3   atorvastatin (LIPITOR) 20 MG tablet Take 1 tablet (20 mg total) by mouth daily. 90 tablet 3   diphenhydrAMINE (BENADRYL) 25 MG tablet Take 25 mg by mouth at bedtime.     fluticasone (FLONASE) 50 MCG/ACT nasal spray Place 2 sprays into both nostrils daily. 16 g 6   gabapentin (NEURONTIN) 300 MG capsule Take 1 capsule (300 mg total) by mouth 2 (two) times daily. 180 capsule 3   lisinopril (ZESTRIL) 20 MG tablet Take 1 tablet (20 mg total) by mouth daily. 90 tablet 3   Melatonin 10 MG TABS Take 10 mg by mouth at bedtime.     Omega 3-6-9 Fatty Acids (TRIPLE OMEGA COMPLEX PO) Take 1 capsule by mouth daily.     umeclidinium-vilanterol (ANORO ELLIPTA) 62.5-25 MCG/INH AEPB Inhale 1 puff into the  lungs daily. 28 each 0   No facility-administered medications prior to visit.     Allergies:   Penicillins, Meloxicam, and Bactrim [sulfamethoxazole-trimethoprim]   Social History   Socioeconomic History   Marital status: Divorced    Spouse name: Not on file   Number of children: 1   Years of education: Not on file   Highest education level: Not on file  Occupational History   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  Tobacco Use   Smoking status: Every Day    Packs/day: 0.50    Types: Cigarettes    Start date: 10/18/1977    Last attempt to quit: 02/10/2019    Years since quitting: 3.4   Smokeless tobacco: Never  Vaping Use   Vaping Use: Never used  Substance and Sexual Activity   Alcohol use: No   Drug use: No   Sexual activity: Not Currently  Other Topics Concern   Not on file  Social History Narrative   Not on file   Social Determinants of Health   Financial Resource Strain: Low Risk  (05/17/2022)   Overall Financial Resource Strain (CARDIA)    Difficulty of Paying Living Expenses: Not hard at all  Food Insecurity: No Food Insecurity (05/17/2022)   Hunger Vital Sign    Worried About Running Out of Food in the Last Year: Never true    Ran Out of Food in the Last Year: Never true  Transportation Needs: No Transportation Needs (05/17/2022)   PRAPARE - Administrator, Civil Service (Medical): No    Lack of Transportation (Non-Medical): No  Physical Activity: Inactive (05/17/2022)   Exercise Vital Sign    Days of Exercise per Week: 0 days    Minutes of Exercise per Session: 0 min  Stress: No Stress Concern Present (05/17/2022)   [employer] of Occupational Health - Occupational Stress Questionnaire    Feeling of Stress : Not at all  Social Connections: Moderately Integrated (05/17/2022)   Social Connection and Isolation Panel [NHANES]    Frequency of Communication with Friends and Family: Three times a week    Frequency of Social  Gatherings with Friends and Family: Three times a week    Attends Religious Services: More than 4 times per year    Active Member of Clubs or Organizations: No    Attends Banker Meetings: More than 4 times  per year    Marital Status: Divorced     Family History:  The patient's family history includes Diabetes in her mother; Hypertension in her father.   Review of Systems:   Please see the history of present illness.     All other systems reviewed and are otherwise negative except as noted above.   Physical Exam:    VS:  BP 126/88   Pulse (!) 57   Ht 5\' 4"  (1.626 m)   Wt 145 lb 3.2 oz (65.9 kg)   SpO2 97%   BMI 24.92 kg/m     BP laying 111/69  P 64  Sittin  104/70  P 67   Standing 108/71  P 72  Standing 4 min  109/76 P 75   General: Pt is a 62 yo in NAD  Head: Normocephalic, atraumatic, sclera non-icteric,.  Neck: No carotid bruits. JVP not elevated  Lungs: CTA   Heart: Regular rate and rhythm. No murmur, Abdomen: Soft, non-tender, non-distended with normoactive bowel sounds. No hepatomegaly. No rebound/guarding. No obvious abdominal masses. Msk:  Moving all extrem  No deformities    Extremities: No clubbing or cyanosis. No lower extremity edema.  2+ PT pulses. Neuro: Alert and oriented X 3  No focal deficits noted. Psych:  Responds to questions appropriately with a normal affect.   Wt Readings from Last 3 Encounters:  07/12/22 145 lb 3.2 oz (65.9 kg)  07/06/22 144 lb 9.6 oz (65.6 kg)  06/15/22 144 lb (65.3 kg)     Studies/Labs Reviewed:   EKG:  EKG is not ordered today.   Recent Labs: 04/21/2022: Hemoglobin 15.1; Platelets 248 07/06/2022: ALT 29; BUN 14; Creatinine, Ser 0.84; Potassium 5.2; Sodium 139; TSH 1.380   Lipid Panel    Component Value Date/Time   CHOL 160 03/15/2022 1019   CHOL 196 01/17/2013 1226   TRIG 112 03/15/2022 1019   TRIG 233 (H) 09/05/2013 1142   TRIG 176 (H) 01/17/2013 1226   HDL 62 03/15/2022 1019   HDL 58 09/05/2013 1142    HDL 57 01/17/2013 1226   CHOLHDL 2.6 03/15/2022 1019   LDLCALC 78 03/15/2022 1019   LDLCALC 58 09/05/2013 1142   LDLCALC 104 (H) 01/17/2013 1226   LDLDIRECT 54 02/16/2018 0903    Additional studies/ records that were reviewed today include:    Plan:   1 HTN  Pt's BP is controlled on current meds   Continue for now   2  Hx of dizziness  This has been a chronic intermitt problem   Not orthostatic on check in past    Volume status is OK today   She is not dizzy Follow    Stay hydreated   Stay active   3. Chest pain Atypical in past   Follow   none now    4  CAD    Rx risk factors      5 HL  LDL 78  HDL 62   Keep on same regimen for now   Check later this spring   6  DM  A1C 7.1   Had been in 5s   Has been on steroids    Plan for it to be rechecked this spring     Reviewed diet   Cut way back on carbs          Your physician recommends that you continue on your current medications as directed. Please refer to the Current Medication list given to you today.  If you  need a refill on your cardiac medications before your next appointment, please call your pharmacy.  No labs or tests today  Thank you for choosing [facility] !        Signed, Dietrich Pates, MD  07/12/2022 11:13 AM    [facility] [address] [place], [place] [address] Phone: [phone]

## 2022-07-12 ENCOUNTER — Ambulatory Visit: Payer: Medicare Other | Attending: Internal Medicine | Admitting: Internal Medicine

## 2022-07-12 ENCOUNTER — Ambulatory Visit (INDEPENDENT_AMBULATORY_CARE_PROVIDER_SITE_OTHER): Payer: Medicare Other | Admitting: *Deleted

## 2022-07-12 ENCOUNTER — Telehealth: Payer: Medicare Other

## 2022-07-12 ENCOUNTER — Encounter: Payer: Self-pay | Admitting: Internal Medicine

## 2022-07-12 VITALS — BP 126/88 | HR 57 | Ht 64.0 in | Wt 145.2 lb

## 2022-07-12 DIAGNOSIS — I2510 Atherosclerotic heart disease of native coronary artery without angina pectoris: Secondary | ICD-10-CM | POA: Diagnosis not present

## 2022-07-12 DIAGNOSIS — E1165 Type 2 diabetes mellitus with hyperglycemia: Secondary | ICD-10-CM

## 2022-07-12 DIAGNOSIS — J410 Simple chronic bronchitis: Secondary | ICD-10-CM

## 2022-07-12 NOTE — Patient Instructions (Signed)
Medication Instructions:  Your physician recommends that you continue on your current medications as directed. Please refer to the Current Medication list given to you today.  *If you need a refill on your cardiac medications before your next appointment, please call your pharmacy*   Lab Work: NONE   If you have labs (blood work) drawn today and your tests are completely normal, you will receive your results only by: University (if you have MyChart) OR A paper copy in the mail If you have any lab test that is abnormal or we need to change your treatment, we will call you to review the results.   Testing/Procedures: NONE    Follow-Up: At [facility], you and your health needs are our priority.  As part of our continuing mission to provide you with exceptional heart care, we have created designated Provider Care Teams.  These Care Teams include your primary Cardiologist (physician) and Advanced Practice Providers (APPs -  Physician Assistants and Nurse Practitioners) who all work together to provide you with the care you need, when you need it.  We recommend signing up for the patient portal called "MyChart".  Sign up information is provided on this After Visit Summary.  MyChart is used to connect with patients for Virtual Visits (Telemedicine).  Patients are able to view lab/test results, encounter notes, upcoming appointments, etc.  Non-urgent messages can be sent to your provider as well.   To learn more about what you can do with MyChart, go to [email or web address].    Your next appointment:    September   The format for your next appointment:   In Person  Provider:   Dorris Carnes, MD    Other Instructions Thank you for choosing [facility]!    Important Information About Sugar

## 2022-07-12 NOTE — Chronic Care Management (AMB) (Signed)
Chronic Care Management   CCM RN Visit Note  07/12/2022 Name: Nancy Thornton MRN: [id number] DOB: 06-15-1961  Subjective: Nancy Thornton is a 62 y.o. year old female who is a primary care patient of Raliegh Ip, DO. The patient was referred to the Chronic Care Management team for assistance with care management needs subsequent to provider initiation of CCM services and plan of care.    Today's Visit:  Engaged with patient by telephone for follow up visit.        Goals Addressed             This Visit's Progress    CCM (COPD) EXPECTED OUTCOME: MONITOR, SELF-MANAGE AND REDUCE SYMPTOMS OF COPD       Current Barriers:  Knowledge Deficits related to COPD management Chronic Disease Management support and education needs related to COPD, smoking cessation No Advanced Directives in place- information has been mailed Patient reports she has all medications and taking as prescribed including inhalers, pt states she smokes 10 cigarettes per day and has tried varying smoking cessation interventions including patches, states she has cut down a lot and will work on this in her own time and how she is best able to do it.   Patient reports she saw cardiologist today and no changes made  Planned Interventions: Advised patient to track and manage COPD triggers Provided instruction about proper use of medications used for management of COPD including inhalers Advised patient to self assesses COPD action plan zone and make appointment with provider if in the yellow zone for 48 hours without improvement Advised patient to engage in light exercise as tolerated 3-5 days a week to aid in the the management of COPD Provided education about and advised patient to utilize infection prevention strategies to reduce risk of respiratory infection Discussed the importance of adequate rest and management of fatigue with COPD Reviewed energy conservation  Symptom Management: Take medications as prescribed    Attend all scheduled provider appointments Call pharmacy for medication refills 3-7 days in advance of running out of medications Attend church or other social activities Perform all self care activities independently  Perform IADL's (shopping, preparing meals, housekeeping, managing finances) independently Call provider office for new concerns or questions  eliminate smoking in my home identify and remove indoor air pollutants limit outdoor activity during cold weather listen for public air quality announcements every day do breathing exercises every day develop a rescue plan follow rescue plan if symptoms flare-up don't eat or exercise right before bedtime get at least 7 to 8 hours of sleep at night practice relaxation or meditation daily Alternate activity with rest Practice good handwashing, wear a mask as needed, avoid sick people  Follow Up Plan: Telephone follow up appointment with care management team member scheduled for: 09/20/22 at 130 pm       CCM (DIABETES) EXPECTED OUTCOME:  MONITOR, SELF-MANAGE AND REDUCE SYMPTOMS OF DIABETES       Current Barriers:  Knowledge Deficits related to Diabetes management Chronic Disease Management support and education needs related to Diabetes and diet No Advanced Directives in place- requests information be mailed Patient reports she lives alone, has adult daughter she can call of if needed, has not been checking CBG, reports " I check only once in awhile",  random readings around 135-143, not sure what fasting readings are, has CBG and blood pressure checked at [address] once weekly when she visits for Bible study, patient states she will try to make an effort to  check more consistently. Patient reports she works part time and walks a lot at work Pharmacist is currently working with patient Patient reports she has lost 75 pounds since December 2020 and states " I'm sure it's from the metformin but I'm having tests to rule out anything else"   Patient I shaving EGD and CT Abdomen/ pelvis in the near future Patient reports she is trying to be more mindful of carbohydrate intake  Planned Interventions: Reviewed medications with patient and discussed importance of medication adherence;        Reviewed prescribed diet with patient carbohydrate modified; Provided patient with written educational materials related to hypo and hyperglycemia and importance of correct treatment;       Advised patient, providing education and rationale, to check cbg per MD order  and record        call provider for findings outside established parameters;       Review of patient status, including review of consultants reports, relevant laboratory and other test results, and medications completed;       Reinforced importance of exercise and getting outdoors daily if able/ weather permitting  Symptom Management: Take medications as prescribed   Attend all scheduled provider appointments Call pharmacy for medication refills 3-7 days in advance of running out of medications Attend church or other social activities Perform all self care activities independently  Perform IADL's (shopping, preparing meals, housekeeping, managing finances) independently Call provider office for new concerns or questions  check blood sugar at prescribed times: per MD order  check feet daily for cuts, sores or redness enter blood sugar readings and medication or insulin into daily log take the blood sugar log to all doctor visits take the blood sugar meter to all doctor visits trim toenails straight across drink 6 to 8 glasses of water each day fill half of plate with vegetables manage portion size read food labels for fat, fiber, carbohydrates and portion size keep feet up while sitting Try to get outside daily and walk Please start checking blood sugar again consistently Be mindful of how many carbohydrates you are eating at each meal as per our discussion  Follow Up  Plan: Telephone follow up appointment with care management team member scheduled for: 09/20/22 at 130 pm          Plan:Telephone follow up appointment with care management team member scheduled for:  09/20/22 at 130 pm  Jacqlyn Larsen [facility], BSN RN Case Manager [place] [phone]

## 2022-07-12 NOTE — Patient Instructions (Signed)
Please call the care guide team at [phone] if you need to cancel or reschedule your appointment.   If you are experiencing a Mental Health or Behavioral Health Crisis or need someone to talk to, please call the Suicide and Crisis Lifeline: 988 call the [place] National Suicide Prevention Lifeline: (732)621-4054 or TTY: 680 209 7195 TTY (780)561-8083) to talk to a trained counselor call 1-800-273-TALK (toll free, 24 hour hotline) go to [facility] [address] [place] [phone]) call the [facility]: [phone] call 911   Following is a copy of the CCM Program Consent:  CCM service includes personalized support from designated clinical staff supervised by the physician, including individualized plan of care and coordination with other care providers 24/7 contact phone numbers for assistance for urgent and routine care needs. Service will only be billed when office clinical staff spend 20 minutes or more in a month to coordinate care. Only one practitioner may furnish and bill the service in a calendar month. The patient may stop CCM services at amy time (effective at the end of the month) by phone call to the office staff. The patient will be responsible for cost sharing (co-pay) or up to 20% of the service fee (after annual deductible is met)  Following is a copy of your full provider care plan:   Goals Addressed             This Visit's Progress    CCM (COPD) EXPECTED OUTCOME: MONITOR, SELF-MANAGE AND REDUCE SYMPTOMS OF COPD       Current Barriers:  Knowledge Deficits related to COPD management Chronic Disease Management support and education needs related to COPD, smoking cessation No Advanced Directives in place- information has been mailed Patient reports she has all medications and taking as prescribed including inhalers, pt states she smokes 10 cigarettes per day and has tried varying smoking cessation  interventions including patches, states she has cut down a lot and will work on this in her own time and how she is best able to do it.   Patient reports she saw cardiologist today and no changes made  Planned Interventions: Advised patient to track and manage COPD triggers Provided instruction about proper use of medications used for management of COPD including inhalers Advised patient to self assesses COPD action plan zone and make appointment with provider if in the yellow zone for 48 hours without improvement Advised patient to engage in light exercise as tolerated 3-5 days a week to aid in the the management of COPD Provided education about and advised patient to utilize infection prevention strategies to reduce risk of respiratory infection Discussed the importance of adequate rest and management of fatigue with COPD Reviewed energy conservation  Symptom Management: Take medications as prescribed   Attend all scheduled provider appointments Call pharmacy for medication refills 3-7 days in advance of running out of medications Attend church or other social activities Perform all self care activities independently  Perform IADL's (shopping, preparing meals, housekeeping, managing finances) independently Call provider office for new concerns or questions  eliminate smoking in my home identify and remove indoor air pollutants limit outdoor activity during cold weather listen for public air quality announcements every day do breathing exercises every day develop a rescue plan follow rescue plan if symptoms flare-up don't eat or exercise right before bedtime get at least 7 to 8 hours of sleep at night practice relaxation or meditation daily Alternate activity with rest Practice good handwashing, wear a mask as  needed, avoid sick people  Follow Up Plan: Telephone follow up appointment with care management team member scheduled for: 09/20/22 at 130 pm       CCM (DIABETES) EXPECTED  OUTCOME:  MONITOR, SELF-MANAGE AND REDUCE SYMPTOMS OF DIABETES       Current Barriers:  Knowledge Deficits related to Diabetes management Chronic Disease Management support and education needs related to Diabetes and diet No Advanced Directives in place- requests information be mailed Patient reports she lives alone, has adult daughter she can call of if needed, has not been checking CBG, reports " I check only once in awhile",  random readings around 135-143, not sure what fasting readings are, has CBG and blood pressure checked at [address] once weekly when she visits for Bible study, patient states she will try to make an effort to check more consistently. Patient reports she works part time and walks a lot at work Pharmacist is currently working with patient Patient reports she has lost 75 pounds since December 2020 and states " I'm sure it's from the metformin but I'm having tests to rule out anything else"  Patient I shaving EGD and CT Abdomen/ pelvis in the near future Patient reports she is trying to be more mindful of carbohydrate intake  Planned Interventions: Reviewed medications with patient and discussed importance of medication adherence;        Reviewed prescribed diet with patient carbohydrate modified; Provided patient with written educational materials related to hypo and hyperglycemia and importance of correct treatment;       Advised patient, providing education and rationale, to check cbg per MD order  and record        call provider for findings outside established parameters;       Review of patient status, including review of consultants reports, relevant laboratory and other test results, and medications completed;       Reinforced importance of exercise and getting outdoors daily if able/ weather permitting  Symptom Management: Take medications as prescribed   Attend all scheduled provider appointments Call pharmacy for medication refills 3-7 days in advance of running  out of medications Attend church or other social activities Perform all self care activities independently  Perform IADL's (shopping, preparing meals, housekeeping, managing finances) independently Call provider office for new concerns or questions  check blood sugar at prescribed times: per MD order  check feet daily for cuts, sores or redness enter blood sugar readings and medication or insulin into daily log take the blood sugar log to all doctor visits take the blood sugar meter to all doctor visits trim toenails straight across drink 6 to 8 glasses of water each day fill half of plate with vegetables manage portion size read food labels for fat, fiber, carbohydrates and portion size keep feet up while sitting Try to get outside daily and walk Please start checking blood sugar again consistently Be mindful of how many carbohydrates you are eating at each meal as per our discussion  Follow Up Plan: Telephone follow up appointment with care management team member scheduled for: 09/20/22 at 130 pm          Patient verbalizes understanding of instructions and care plan provided today and agrees to view in [place]. Active MyChart status and patient understanding of how to access instructions and care plan via MyChart confirmed with patient.     Telephone follow up appointment with care management team member scheduled for:  09/16/22 at 130 pm  Carbohydrate Counting for Diabetes Mellitus,  Adult Carbohydrate counting is a method of keeping track of how many carbohydrates you eat. Eating carbohydrates increases the amount of sugar (glucose) in the blood. Counting how many carbohydrates you eat improves how well you manage your blood glucose. This, in turn, helps you manage your diabetes. Carbohydrates are measured in grams (g) per serving. It is important to know how many carbohydrates (in grams or by serving size) you can have in each meal. This is different for every person. A dietitian  can help you make a meal plan and calculate how many carbohydrates you should have at each meal and snack. What foods contain carbohydrates? Carbohydrates are found in the following foods: Grains, such as breads and cereals. Dried beans and soy products. Starchy vegetables, such as potatoes, peas, and corn. Fruit and fruit juices. Milk and yogurt. Sweets and snack foods, such as cake, cookies, candy, chips, and soft drinks. How do I count carbohydrates in foods? There are two ways to count carbohydrates in food. You can read food labels or learn standard serving sizes of foods. You can use either of these methods or a combination of both. Using the Nutrition Facts label The Nutrition Facts list is included on the labels of almost all packaged foods and beverages in the [place]. It includes: The serving size. Information about nutrients in each serving, including the grams of carbohydrate per serving. To use the Nutrition Facts, decide how many servings you will have. Then, multiply the number of servings by the number of carbohydrates per serving. The resulting number is the total grams of carbohydrates that you will be having. Learning the standard serving sizes of foods When you eat carbohydrate foods that are not packaged or do not include Nutrition Facts on the label, you need to measure the servings in order to count the grams of carbohydrates. Measure the foods that you will eat with a food scale or measuring cup, if needed. Decide how many standard-size servings you will eat. Multiply the number of servings by 15. For foods that contain carbohydrates, one serving equals 15 g of carbohydrates. For example, if you eat 2 cups or 10 oz (300 g) of strawberries, you will have eaten 2 servings and 30 g of carbohydrates (2 servings x 15 g = 30 g). For foods that have more than one food mixed, such as soups and casseroles, you must count the carbohydrates in each food that is included. The  following list contains standard serving sizes of common carbohydrate-rich foods. Each of these servings has about 15 g of carbohydrates: 1 slice of bread. 1 six-inch (15 cm) tortilla. ? cup or 2 oz (53 g) cooked rice or pasta.  cup or 3 oz (85 g) cooked or canned, drained and rinsed beans or lentils.  cup or 3 oz (85 g) starchy vegetable, such as peas, corn, or squash.  cup or 4 oz (120 g) hot cereal.  cup or 3 oz (85 g) boiled or mashed potatoes, or  or 3 oz (85 g) of a large baked potato.  cup or 4 fl oz (118 mL) fruit juice. 1 cup or 8 fl oz (237 mL) milk. 1 small or 4 oz (106 g) apple.  or 2 oz (63 g) of a medium banana. 1 cup or 5 oz (150 g) strawberries. 3 cups or 1 oz (28.3 g) popped popcorn. What is an example of carbohydrate counting? To calculate the grams of carbohydrates in this sample meal, follow the steps shown below. Sample meal  3 oz (85 g) chicken breast. ? cup or 4 oz (106 g) brown rice.  cup or 3 oz (85 g) corn. 1 cup or 8 fl oz (237 mL) milk. 1 cup or 5 oz (150 g) strawberries with sugar-free whipped topping. Carbohydrate calculation Identify the foods that contain carbohydrates: Rice. Corn. Milk. Strawberries. Calculate how many servings you have of each food: 2 servings rice. 1 serving corn. 1 serving milk. 1 serving strawberries. Multiply each number of servings by 15 g: 2 servings rice x 15 g = 30 g. 1 serving corn x 15 g = 15 g. 1 serving milk x 15 g = 15 g. 1 serving strawberries x 15 g = 15 g. Add together all of the amounts to find the total grams of carbohydrates eaten: 30 g + 15 g + 15 g + 15 g = 75 g of carbohydrates total. What are tips for following this plan? Shopping Develop a meal plan and then make a shopping list. Buy fresh and frozen vegetables, fresh and frozen fruit, dairy, eggs, beans, lentils, and whole grains. Look at food labels. Choose foods that have more fiber and less sugar. Avoid processed foods and foods with  added sugars. Meal planning Aim to have the same number of grams of carbohydrates at each meal and for each snack time. Plan to have regular, balanced meals and snacks. Where to find more information American Diabetes Association: diabetes.org Centers for Disease Control and Prevention: StoreMirror.com.[place] Academy of Nutrition and Dietetics: eatright.org Association of Diabetes Care & Education Specialists: diabeteseducator.org Summary Carbohydrate counting is a method of keeping track of how many carbohydrates you eat. Eating carbohydrates increases the amount of sugar (glucose) in your blood. Counting how many carbohydrates you eat improves how well you manage your blood glucose. This helps you manage your diabetes. A dietitian can help you make a meal plan and calculate how many carbohydrates you should have at each meal and snack. This information is not intended to replace advice given to you by your health care provider. Make sure you discuss any questions you have with your health care provider. Document Revised: 01/22/2020 Document Reviewed: 01/22/2020 Elsevier Patient Education  [place].

## 2022-07-13 ENCOUNTER — Telehealth: Payer: Medicare Other

## 2022-08-03 DIAGNOSIS — F17210 Nicotine dependence, cigarettes, uncomplicated: Secondary | ICD-10-CM | POA: Diagnosis not present

## 2022-08-03 DIAGNOSIS — E1159 Type 2 diabetes mellitus with other circulatory complications: Secondary | ICD-10-CM | POA: Diagnosis not present

## 2022-08-03 DIAGNOSIS — J449 Chronic obstructive pulmonary disease, unspecified: Secondary | ICD-10-CM | POA: Diagnosis not present

## 2022-08-15 ENCOUNTER — Ambulatory Visit: Payer: Medicare Other | Admitting: Internal Medicine

## 2022-09-01 ENCOUNTER — Encounter: Payer: Self-pay | Admitting: Radiology

## 2022-09-13 ENCOUNTER — Ambulatory Visit (INDEPENDENT_AMBULATORY_CARE_PROVIDER_SITE_OTHER): Payer: Medicare Other | Admitting: Pharmacist

## 2022-09-13 DIAGNOSIS — E1159 Type 2 diabetes mellitus with other circulatory complications: Secondary | ICD-10-CM

## 2022-09-13 DIAGNOSIS — I152 Hypertension secondary to endocrine disorders: Secondary | ICD-10-CM

## 2022-09-13 DIAGNOSIS — Z720 Tobacco use: Secondary | ICD-10-CM

## 2022-09-13 DIAGNOSIS — J410 Simple chronic bronchitis: Secondary | ICD-10-CM | POA: Diagnosis not present

## 2022-09-13 MED ORDER — VARENICLINE TARTRATE 0.5 MG PO TABS: ORAL_TABLET | ORAL | 0 refills | Status: DC

## 2022-09-13 NOTE — Progress Notes (Signed)
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 PMH is significant for HTN, T2DM, HLD, tobacco use, and back pain.   Discussed inhaler control. She has 5 Anoro Eliptas and 13 albuterols on hand from the health department. Reports her breathing is doing better, she does have some congestion in the morning but denies phlegm.   Age when started using tobacco on a daily basis 62 YO. Brand smoked [place] (not menthol). Number of cigarettes/day 10-11.  Estimated nicotine content per cigarette (mg) 1.  Estimated nicotine intake per day >10 mg.   Denies waking to smoke  Fagerstrom Score Question Scoring Patient Score  How soon after waking do you smoke your first cigarette? <5 mins (3) 5-30 mins (2) 31-60 mins (1) >60 mins (0) 3  Do you find it difficult NOT to smoke in places where you shouldn't? Yes(1) No (0) 0  Which cigarette would you most hate to give up? First one in AM (1) Any other one (0) 0   How many cigarettes do you smoke/day? 10 or less (0) 11-20 (1) 21-30 (2) >30 (3) 1  Do you smoke more during the first few hours after waking? Yes (1) No (0) 1  Do you smoke if you are so ill you cannot get out of bed? Yes (1) No (0) 0    Total Score  5   Score interpretation: low 1-2, low-to-moderate 3-4, moderate 5-7, high >7  Most recent quit attempt: September 2020  Longest time ever been tobacco free: 3 months.  Medications used in past cessation efforts include: patches (worked for a little while)   Rates IMPORTANCE of quitting tobacco on 1-10 scale of 8. Rates CONFIDENCE of quitting tobacco on 1-10 scale of 5.  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  Set goal of cutting down to 6 cigarettes in the next month. -Initiated varenicline 0.5 mg by mouth once daily with food x7 days, then 0.5 mg by mouth twice daily with food thereafter. Patient counseled on purpose, proper use, and potential adverse effects, including GI upset. -Provided information on 1 800-QUIT NOW support program.   COPD. Currently controlled with Anoro Ellipta and albuterol. Patient has >5 month supply on hand. Breathing is controlled. Will revisit Breztri use once patient has used up her Anoro supply.   Written patient instructions provided. Patient verbalized understanding of treatment plan.  Total time in face to face counseling 25 minutes.    Follow-up:  Pharmacist 1 month. PCP clinic visit 09/20/2022  Joseph Art, Pharm.D. PGY-2 Ambulatory Care Pharmacy Resident  Regina Eck, PharmD, BCACP Clinical Pharmacist, [facility]

## 2022-09-14 DIAGNOSIS — M9903 Segmental and somatic dysfunction of lumbar region: Secondary | ICD-10-CM | POA: Diagnosis not present

## 2022-09-14 DIAGNOSIS — M9901 Segmental and somatic dysfunction of cervical region: Secondary | ICD-10-CM | POA: Diagnosis not present

## 2022-09-14 DIAGNOSIS — M62830 Muscle spasm of back: Secondary | ICD-10-CM | POA: Diagnosis not present

## 2022-09-14 DIAGNOSIS — M9902 Segmental and somatic dysfunction of thoracic region: Secondary | ICD-10-CM | POA: Diagnosis not present

## 2022-09-15 DIAGNOSIS — M9903 Segmental and somatic dysfunction of lumbar region: Secondary | ICD-10-CM | POA: Diagnosis not present

## 2022-09-15 DIAGNOSIS — M9902 Segmental and somatic dysfunction of thoracic region: Secondary | ICD-10-CM | POA: Diagnosis not present

## 2022-09-15 DIAGNOSIS — M9901 Segmental and somatic dysfunction of cervical region: Secondary | ICD-10-CM | POA: Diagnosis not present

## 2022-09-15 DIAGNOSIS — M62830 Muscle spasm of back: Secondary | ICD-10-CM | POA: Diagnosis not present

## 2022-09-19 DIAGNOSIS — M9902 Segmental and somatic dysfunction of thoracic region: Secondary | ICD-10-CM | POA: Diagnosis not present

## 2022-09-19 DIAGNOSIS — M9903 Segmental and somatic dysfunction of lumbar region: Secondary | ICD-10-CM | POA: Diagnosis not present

## 2022-09-19 DIAGNOSIS — M9901 Segmental and somatic dysfunction of cervical region: Secondary | ICD-10-CM | POA: Diagnosis not present

## 2022-09-19 DIAGNOSIS — M62830 Muscle spasm of back: Secondary | ICD-10-CM | POA: Diagnosis not present

## 2022-09-20 ENCOUNTER — Ambulatory Visit (INDEPENDENT_AMBULATORY_CARE_PROVIDER_SITE_OTHER): Payer: Medicare Other | Admitting: *Deleted

## 2022-09-20 DIAGNOSIS — E1165 Type 2 diabetes mellitus with hyperglycemia: Secondary | ICD-10-CM

## 2022-09-20 DIAGNOSIS — J410 Simple chronic bronchitis: Secondary | ICD-10-CM

## 2022-09-20 NOTE — Chronic Care Management (AMB) (Signed)
Chronic Care Management   CCM RN Visit Note  09/20/2022 Name: Nancy Thornton MRN: [id number] DOB: [birth year]  Subjective: Nancy Thornton is a 62 y.o. year old female who is a primary care patient of Nancy Norlander, DO. The patient was referred to the Chronic Care Management team for assistance with care management needs subsequent to provider initiation of CCM services and plan of care.    Today's Visit:  Engaged with patient by telephone for follow up visit.        Goals Addressed             This Visit's Progress    CCM (COPD) EXPECTED OUTCOME: MONITOR, SELF-MANAGE AND REDUCE SYMPTOMS OF COPD       Current Barriers:  Knowledge Deficits related to COPD management Chronic Disease Management support and education needs related to COPD, smoking cessation No Advanced Directives in place- information has been mailed Patient reports she has all medications and taking as prescribed including inhalers, pt states she smokes 10 cigarettes per day and has tried varying smoking cessation interventions including patches, states she has cut down a lot and will work on this in her own time and how she is best able to do it.  Patient reports she is now working with pharmacist on smoking cessation and is smoking 10 cigarettes per day, has Chantix on hand but has not started yet.  Patient reports breathing is "good"  Planned Interventions: Advised patient to track and manage COPD triggers Advised patient to self assesses COPD action plan zone and make appointment with provider if in the yellow zone for 48 hours without improvement Advised patient to engage in light exercise as tolerated 3-5 days a week to aid in the the management of COPD Provided education about and advised patient to utilize infection prevention strategies to reduce risk of respiratory infection Discussed the importance of adequate rest and management of fatigue with COPD Reinforced energy conservation Reviewed being careful  outside with increased pollen and wear a mask as needed  Symptom Management: Take medications as prescribed   Attend all scheduled provider appointments Call pharmacy for medication refills 3-7 days in advance of running out of medications Attend church or other social activities Perform all self care activities independently  Perform IADL's (shopping, preparing meals, housekeeping, managing finances) independently Call provider office for new concerns or questions  eliminate smoking in my home identify and remove indoor air pollutants limit outdoor activity during cold weather listen for public air quality announcements every day do breathing exercises every day develop a rescue plan follow rescue plan if symptoms flare-up don't eat or exercise right before bedtime get at least 7 to 8 hours of sleep at night practice relaxation or meditation daily Alternate activity with rest Practice good handwashing, wear a mask as needed, avoid sick people Pollen counts are up, be mindful of this if outdoors as this can be a trigger with COPD  Follow Up Plan: Telephone follow up appointment with care management team member scheduled for:   12/21/22 at 3 pm       CCM (DIABETES) EXPECTED OUTCOME:  MONITOR, SELF-MANAGE AND REDUCE SYMPTOMS OF DIABETES       Current Barriers:  Knowledge Deficits related to Diabetes management Chronic Disease Management support and education needs related to Diabetes and diet No Advanced Directives in place- information previously mailed Patient reports she lives alone, has adult daughter she can call of if needed, has not been checking CBG, reports " I check only once  in awhile", states AIC "is very good and I'm not on medication for diabetes now", random readings around 100's range, not sure what fasting readings are, has CBG and blood pressure checked at [address] once weekly when she visits for Bible study, patient states she will try to make an effort to check more  consistently. Patient reports she works part time and walks a lot at work Pharmacist is currently working with patient Patient reports she has lost 75 pounds since December 2020 and states " I'm sure it's from the metformin but I'm having tests to rule out anything else"  Patient reports she did have CT of abdomen/ pelvis and results unremarkable,  pt reports she feels she is a healthy weight now of 146 pounds Patient reports she is trying to be more mindful of carbohydrate intake  Planned Interventions: Reviewed medications with patient and discussed importance of medication adherence;        Reviewed prescribed diet with patient carbohydrate modified; Provided patient with written educational materials related to hypo and hyperglycemia and importance of correct treatment;       Advised patient, providing education and rationale, to check cbg per MD order  and record        call provider for findings outside established parameters;       Review of patient status, including review of consultants reports, relevant laboratory and other test results, and medications completed;       Advised patient to discuss any issues with blood sugar, medications with provider;      Reviewed importance of exercise and being consistent  Symptom Management: Take medications as prescribed   Attend all scheduled provider appointments Call pharmacy for medication refills 3-7 days in advance of running out of medications Attend church or other social activities Perform all self care activities independently  Perform IADL's (shopping, preparing meals, housekeeping, managing finances) independently Call provider office for new concerns or questions  check blood sugar at prescribed times: per MD order  check feet daily for cuts, sores or redness enter blood sugar readings and medication or insulin into daily log take the blood sugar log to all doctor visits take the blood sugar meter to all doctor visits trim  toenails straight across drink 6 to 8 glasses of water each day fill half of plate with vegetables manage portion size read food labels for fat, fiber, carbohydrates and portion size set a realistic goal keep feet up while sitting wash and dry feet carefully every day wear comfortable, cotton socks wear comfortable, well-fitting shoes Try to exercise daily Please start checking blood sugar again consistently Be mindful of how many carbohydrates you are eating at each meal as per our discussion  Follow Up Plan: Telephone follow up appointment with care management team member scheduled for:   12/21/22 at 3 pm          Plan:Telephone follow up appointment with care management team member scheduled for:  12/21/22 at 3 pm  Jacqlyn Larsen [facility], BSN RN Case Manager [place] [phone]

## 2022-09-20 NOTE — Patient Instructions (Signed)
Please call the care guide team at [phone] if you need to cancel or reschedule your appointment.   If you are experiencing a Mental Health or [place] or need someone to talk to, please call the Suicide and Crisis Lifeline: 988 call the [place] National Suicide Prevention Lifeline: 228 166 2572 or TTY: 540-163-0325 TTY 2241760416) to talk to a trained counselor call 1-800-273-TALK (toll free, 24 hour hotline) go to [facility] [address], [place] [phone]) call the [facility]: [phone] call 911   Following is a copy of the CCM Program Consent:  CCM service includes personalized support from designated clinical staff supervised by the physician, including individualized plan of care and coordination with other care providers 24/7 contact phone numbers for assistance for urgent and routine care needs. Service will only be billed when office clinical staff spend 20 minutes or more in a month to coordinate care. Only one practitioner may furnish and bill the service in a calendar month. The patient may stop CCM services at amy time (effective at the end of the month) by phone call to the office staff. The patient will be responsible for cost sharing (co-pay) or up to 20% of the service fee (after annual deductible is met)  Following is a copy of your full provider care plan:   Goals Addressed             This Visit's Progress    CCM (COPD) EXPECTED OUTCOME: MONITOR, SELF-MANAGE AND REDUCE SYMPTOMS OF COPD       Current Barriers:  Knowledge Deficits related to COPD management Chronic Disease Management support and education needs related to COPD, smoking cessation No Advanced Directives in place- information has been mailed Patient reports she has all medications and taking as prescribed including inhalers, pt states she smokes 10 cigarettes per day and has tried varying smoking cessation  interventions including patches, states she has cut down a lot and will work on this in her own time and how she is best able to do it.  Patient reports she is now working with pharmacist on smoking cessation and is smoking 10 cigarettes per day, has Chantix on hand but has not started yet.  Patient reports breathing is "good"  Planned Interventions: Advised patient to track and manage COPD triggers Advised patient to self assesses COPD action plan zone and make appointment with provider if in the yellow zone for 48 hours without improvement Advised patient to engage in light exercise as tolerated 3-5 days a week to aid in the the management of COPD Provided education about and advised patient to utilize infection prevention strategies to reduce risk of respiratory infection Discussed the importance of adequate rest and management of fatigue with COPD Reinforced energy conservation Reviewed being careful outside with increased pollen and wear a mask as needed  Symptom Management: Take medications as prescribed   Attend all scheduled provider appointments Call pharmacy for medication refills 3-7 days in advance of running out of medications Attend church or other social activities Perform all self care activities independently  Perform IADL's (shopping, preparing meals, housekeeping, managing finances) independently Call provider office for new concerns or questions  eliminate smoking in my home identify and remove indoor air pollutants limit outdoor activity during cold weather listen for public air quality announcements every day do breathing exercises every day develop a rescue plan follow rescue plan if symptoms flare-up don't eat or exercise right before bedtime get at least 7 to 8  hours of sleep at night practice relaxation or meditation daily Alternate activity with rest Practice good handwashing, wear a mask as needed, avoid sick people Pollen counts are up, be mindful of this  if outdoors as this can be a trigger with COPD  Follow Up Plan: Telephone follow up appointment with care management team member scheduled for:   12/21/22 at 3 pm       CCM (DIABETES) EXPECTED OUTCOME:  MONITOR, SELF-MANAGE AND REDUCE SYMPTOMS OF DIABETES       Current Barriers:  Knowledge Deficits related to Diabetes management Chronic Disease Management support and education needs related to Diabetes and diet No Advanced Directives in place- information previously mailed Patient reports she lives alone, has adult daughter she can call of if needed, has not been checking CBG, reports " I check only once in awhile", states AIC "is very good and I'm not on medication for diabetes now", random readings around 100's range, not sure what fasting readings are, has CBG and blood pressure checked at [address] once weekly when she visits for Bible study, patient states she will try to make an effort to check more consistently. Patient reports she works part time and walks a lot at work Pharmacist is currently working with patient Patient reports she has lost 75 pounds since December 2020 and states " I'm sure it's from the metformin but I'm having tests to rule out anything else"  Patient reports she did have CT of abdomen/ pelvis and results unremarkable,  pt reports she feels she is a healthy weight now of 146 pounds Patient reports she is trying to be more mindful of carbohydrate intake  Planned Interventions: Reviewed medications with patient and discussed importance of medication adherence;        Reviewed prescribed diet with patient carbohydrate modified; Provided patient with written educational materials related to hypo and hyperglycemia and importance of correct treatment;       Advised patient, providing education and rationale, to check cbg per MD order  and record        call provider for findings outside established parameters;       Review of patient status, including review of consultants  reports, relevant laboratory and other test results, and medications completed;       Advised patient to discuss any issues with blood sugar, medications with provider;      Reviewed importance of exercise and being consistent  Symptom Management: Take medications as prescribed   Attend all scheduled provider appointments Call pharmacy for medication refills 3-7 days in advance of running out of medications Attend church or other social activities Perform all self care activities independently  Perform IADL's (shopping, preparing meals, housekeeping, managing finances) independently Call provider office for new concerns or questions  check blood sugar at prescribed times: per MD order  check feet daily for cuts, sores or redness enter blood sugar readings and medication or insulin into daily log take the blood sugar log to all doctor visits take the blood sugar meter to all doctor visits trim toenails straight across drink 6 to 8 glasses of water each day fill half of plate with vegetables manage portion size read food labels for fat, fiber, carbohydrates and portion size set a realistic goal keep feet up while sitting wash and dry feet carefully every day wear comfortable, cotton socks wear comfortable, well-fitting shoes Try to exercise daily Please start checking blood sugar again consistently Be mindful of how many carbohydrates you are eating at  each meal as per our discussion  Follow Up Plan: Telephone follow up appointment with care management team member scheduled for:   12/21/22 at 3 pm          Patient verbalizes understanding of instructions and care plan provided today and agrees to view in [place]. Active MyChart status and patient understanding of how to access instructions and care plan via MyChart confirmed with patient.     Telephone follow up appointment with care management team member scheduled for:  12/21/22 at 3 pm

## 2022-09-21 DIAGNOSIS — M9901 Segmental and somatic dysfunction of cervical region: Secondary | ICD-10-CM | POA: Diagnosis not present

## 2022-09-21 DIAGNOSIS — M9902 Segmental and somatic dysfunction of thoracic region: Secondary | ICD-10-CM | POA: Diagnosis not present

## 2022-09-21 DIAGNOSIS — M9903 Segmental and somatic dysfunction of lumbar region: Secondary | ICD-10-CM | POA: Diagnosis not present

## 2022-09-21 DIAGNOSIS — M62830 Muscle spasm of back: Secondary | ICD-10-CM | POA: Diagnosis not present

## 2022-09-22 DIAGNOSIS — M9903 Segmental and somatic dysfunction of lumbar region: Secondary | ICD-10-CM | POA: Diagnosis not present

## 2022-09-22 DIAGNOSIS — M62830 Muscle spasm of back: Secondary | ICD-10-CM | POA: Diagnosis not present

## 2022-09-22 DIAGNOSIS — M9902 Segmental and somatic dysfunction of thoracic region: Secondary | ICD-10-CM | POA: Diagnosis not present

## 2022-09-22 DIAGNOSIS — M9901 Segmental and somatic dysfunction of cervical region: Secondary | ICD-10-CM | POA: Diagnosis not present

## 2022-09-26 DIAGNOSIS — M9901 Segmental and somatic dysfunction of cervical region: Secondary | ICD-10-CM | POA: Diagnosis not present

## 2022-09-26 DIAGNOSIS — M9903 Segmental and somatic dysfunction of lumbar region: Secondary | ICD-10-CM | POA: Diagnosis not present

## 2022-09-26 DIAGNOSIS — M9902 Segmental and somatic dysfunction of thoracic region: Secondary | ICD-10-CM | POA: Diagnosis not present

## 2022-09-26 DIAGNOSIS — M62830 Muscle spasm of back: Secondary | ICD-10-CM | POA: Diagnosis not present

## 2022-09-28 DIAGNOSIS — M9903 Segmental and somatic dysfunction of lumbar region: Secondary | ICD-10-CM | POA: Diagnosis not present

## 2022-09-28 DIAGNOSIS — M62830 Muscle spasm of back: Secondary | ICD-10-CM | POA: Diagnosis not present

## 2022-09-28 DIAGNOSIS — M9901 Segmental and somatic dysfunction of cervical region: Secondary | ICD-10-CM | POA: Diagnosis not present

## 2022-09-28 DIAGNOSIS — M9902 Segmental and somatic dysfunction of thoracic region: Secondary | ICD-10-CM | POA: Diagnosis not present

## 2022-09-29 DIAGNOSIS — M9903 Segmental and somatic dysfunction of lumbar region: Secondary | ICD-10-CM | POA: Diagnosis not present

## 2022-09-29 DIAGNOSIS — M9902 Segmental and somatic dysfunction of thoracic region: Secondary | ICD-10-CM | POA: Diagnosis not present

## 2022-09-29 DIAGNOSIS — M62830 Muscle spasm of back: Secondary | ICD-10-CM | POA: Diagnosis not present

## 2022-09-29 DIAGNOSIS — M9901 Segmental and somatic dysfunction of cervical region: Secondary | ICD-10-CM | POA: Diagnosis not present

## 2022-10-02 DIAGNOSIS — J449 Chronic obstructive pulmonary disease, unspecified: Secondary | ICD-10-CM | POA: Diagnosis not present

## 2022-10-02 DIAGNOSIS — F17210 Nicotine dependence, cigarettes, uncomplicated: Secondary | ICD-10-CM | POA: Diagnosis not present

## 2022-10-02 DIAGNOSIS — E1159 Type 2 diabetes mellitus with other circulatory complications: Secondary | ICD-10-CM

## 2022-10-03 DIAGNOSIS — M9903 Segmental and somatic dysfunction of lumbar region: Secondary | ICD-10-CM | POA: Diagnosis not present

## 2022-10-03 DIAGNOSIS — M62830 Muscle spasm of back: Secondary | ICD-10-CM | POA: Diagnosis not present

## 2022-10-03 DIAGNOSIS — M9901 Segmental and somatic dysfunction of cervical region: Secondary | ICD-10-CM | POA: Diagnosis not present

## 2022-10-03 DIAGNOSIS — M9902 Segmental and somatic dysfunction of thoracic region: Secondary | ICD-10-CM | POA: Diagnosis not present

## 2022-10-06 DIAGNOSIS — M9903 Segmental and somatic dysfunction of lumbar region: Secondary | ICD-10-CM | POA: Diagnosis not present

## 2022-10-06 DIAGNOSIS — M9902 Segmental and somatic dysfunction of thoracic region: Secondary | ICD-10-CM | POA: Diagnosis not present

## 2022-10-06 DIAGNOSIS — M9901 Segmental and somatic dysfunction of cervical region: Secondary | ICD-10-CM | POA: Diagnosis not present

## 2022-10-06 DIAGNOSIS — M62830 Muscle spasm of back: Secondary | ICD-10-CM | POA: Diagnosis not present

## 2022-10-10 ENCOUNTER — Encounter: Payer: Self-pay | Admitting: Family Medicine

## 2022-10-10 ENCOUNTER — Ambulatory Visit (INDEPENDENT_AMBULATORY_CARE_PROVIDER_SITE_OTHER): Payer: Medicare Other | Admitting: Family Medicine

## 2022-10-10 VITALS — BP 115/70 | HR 83 | Temp 98.6°F | Ht 64.0 in | Wt 147.0 lb

## 2022-10-10 DIAGNOSIS — Z720 Tobacco use: Secondary | ICD-10-CM

## 2022-10-10 DIAGNOSIS — M9901 Segmental and somatic dysfunction of cervical region: Secondary | ICD-10-CM | POA: Diagnosis not present

## 2022-10-10 DIAGNOSIS — M9903 Segmental and somatic dysfunction of lumbar region: Secondary | ICD-10-CM | POA: Diagnosis not present

## 2022-10-10 DIAGNOSIS — E1169 Type 2 diabetes mellitus with other specified complication: Secondary | ICD-10-CM | POA: Diagnosis not present

## 2022-10-10 DIAGNOSIS — Z23 Encounter for immunization: Secondary | ICD-10-CM

## 2022-10-10 DIAGNOSIS — I152 Hypertension secondary to endocrine disorders: Secondary | ICD-10-CM | POA: Diagnosis not present

## 2022-10-10 DIAGNOSIS — E785 Hyperlipidemia, unspecified: Secondary | ICD-10-CM | POA: Diagnosis not present

## 2022-10-10 DIAGNOSIS — E1159 Type 2 diabetes mellitus with other circulatory complications: Secondary | ICD-10-CM | POA: Diagnosis not present

## 2022-10-10 DIAGNOSIS — M9902 Segmental and somatic dysfunction of thoracic region: Secondary | ICD-10-CM | POA: Diagnosis not present

## 2022-10-10 DIAGNOSIS — M62830 Muscle spasm of back: Secondary | ICD-10-CM | POA: Diagnosis not present

## 2022-10-10 DIAGNOSIS — E1165 Type 2 diabetes mellitus with hyperglycemia: Secondary | ICD-10-CM | POA: Diagnosis not present

## 2022-10-10 LAB — BAYER DCA HB A1C WAIVED: HB A1C (BAYER DCA - WAIVED): 6.1 % — ABNORMAL HIGH (ref 4.8–5.6)

## 2022-10-10 NOTE — Addendum Note (Signed)
Addended by: Raliegh Ip on: 10/10/2022 12:48 PM   Modules accepted: Level of Service

## 2022-10-10 NOTE — Progress Notes (Signed)
Subjective: CC:DM PCP: Raliegh IpGottschalk, Arliene Rosenow M, DO AVW:[id number]:Nancy Thornton is a 62 y.o. female presenting to clinic today for:  1. Type 2 Diabetes with hypertension, hyperlipidemia:  Diabetes has been diet controlled.  She does report some increased consumption of sugar lately because her dog died.  She is compliant with Norvasc, lisinopril, Lipitor.  Compliant with atenolol.  Last eye exam: Up-to-date Last foot exam: Up-to-date Last A1c:  Lab Results  Component Value Date   HGBA1C 7.1 (H) 07/06/2022   Nephropathy screen indicated?:  Up-to-date Last flu, zoster and/or pneumovax:  Immunization History  Administered Date(s) Administered   Influenza, High Dose Seasonal PF 03/30/2018   Influenza, Quadrivalent, Recombinant, Inj, Pf 03/20/2019, 03/14/2022   Influenza, Seasonal, Injecte, Preservative Fre 04/18/2013, 04/12/2014, 03/12/2015, 04/08/2017   Influenza,inj,Quad PF,6+ Mos 04/18/2013, 03/12/2015, 04/08/2017, 03/30/2018   Influenza-Unspecified 04/18/2013, 04/12/2014, 03/12/2015, 03/12/2015, 04/08/2017, 04/08/2017, 03/30/2018, 03/20/2019, 04/25/2020   Moderna Sars-Covid-2 Vaccination 09/20/2019, 10/22/2019, 02/24/2020, 04/25/2020   Pneumococcal Conjugate-13 09/05/2013   Pneumococcal Polysaccharide-23 03/12/2015   Respiratory Syncytial Virus Vaccine,Recomb Aduvanted(Arexvy) 03/14/2022   Td 01/09/2017   Tdap 01/09/2017   Zoster Recombinat (Shingrix) 03/14/2022, 10/10/2022    ROS: No chest pain, breath or dizziness reported.  Things have been a little bit better now that she is been on lower doses of medications.  She continues to smoke but did start Chantix in efforts to alleviate this.    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    Current Outpatient Medications:    acetaminophen (TYLENOL) 500 MG tablet, Take 1,000 mg by mouth at bedtime., Disp: , Rfl:    albuterol (VENTOLIN HFA) 108 (90 Base) MCG/ACT inhaler, INHALE 2 PUFFS BY MOUTH EVERY 6 HOURS AS NEEDED FOR WHEEZING AND FOR SHORTNESS OF BREATH (Patient taking differently: Inhale 2 puffs into the lungs at bedtime.), Disp: 9 g, Rfl: 0   amLODipine (NORVASC) 5 MG tablet, Take 1 tablet (5 mg total) by mouth daily., Disp: 90 tablet, Rfl: 3   atenolol (TENORMIN) 50 MG tablet, Take 1 tablet (50 mg total) by mouth daily., Disp: 90 tablet, Rfl: 3   atorvastatin (LIPITOR) 20 MG tablet, Take 1 tablet (20 mg total) by mouth daily., Disp: 90 tablet, Rfl: 3   diphenhydrAMINE (BENADRYL) 25 MG tablet, Take 25 mg by mouth at bedtime., Disp: , Rfl:    fluticasone (FLONASE) 50 MCG/ACT nasal spray, Place 2 sprays into both nostrils daily., Disp: 16 g, Rfl: 6   gabapentin (NEURONTIN) 300 MG capsule, Take 1 capsule (300 mg total) by mouth 2 (two) times daily., Disp: 180 capsule, Rfl: 3   lisinopril (ZESTRIL) 20 MG tablet, Take 1 tablet (20 mg total) by mouth daily., Disp: 90 tablet, Rfl: 3   Melatonin 10 MG TABS, Take 10 mg by mouth at bedtime., Disp: , Rfl:    Omega 3-6-9 Fatty Acids (TRIPLE OMEGA COMPLEX PO), Take 1 capsule by mouth daily., Disp: , Rfl:    umeclidinium-vilanterol (ANORO ELLIPTA) 62.5-25 MCG/INH AEPB, Inhale 1 puff into the lungs daily., Disp: 28 each,  Rfl: 0   varenicline (CHANTIX) 0.5 MG tablet, Take 0.5mg  (1 tablet) by mouth daily for 7 days. 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  Tobacco Use   Smoking status: Every Day    Packs/day: .5    Types: Cigarettes    Start date: 10/18/1977    Last attempt to quit: 02/10/2019    Years since quitting: 3.6   Smokeless tobacco: Never  Vaping Use   Vaping Use: Never used  Substance and Sexual Activity   Alcohol use: No   Drug use: No   Sexual activity: Not Currently  Other Topics Concern   Not on file  Social History Narrative   Not on file   Social Determinants of Health   Financial Resource Strain: Low Risk  (05/17/2022)   Overall Financial Resource Strain (CARDIA)    Difficulty of Paying Living Expenses: Not hard at all  Food Insecurity: No Food Insecurity (05/17/2022)   Hunger Vital Sign    Worried About Running Out of Food in the Last Year: Never true    Ran Out of Food in the Last Year: Never true  Transportation Needs: No Transportation Needs (05/17/2022)   PRAPARE - Administrator, Civil Service (Medical): No    Lack of Transportation (Non-Medical): No  Physical Activity: Inactive (05/17/2022)   Exercise Vital Sign    Days of Exercise per Week: 0 days    Minutes of Exercise per Session: 0 min  Stress: No Stress Concern Present (05/17/2022)   [employer] of Occupational Health - Occupational Stress Questionnaire    Feeling of Stress : Not at all  Social Connections: Moderately Integrated (05/17/2022)   Social Connection and Isolation Panel [NHANES]    Frequency of Communication with Friends and Family: Three times a week    Frequency of Social Gatherings with Friends and Family: Three times a week    Attends Religious Services: More than 4 times per year    Active Member of Clubs or Organizations: No    Attends Engineer, structural: More than 4 times per year    Marital Status: Divorced  Catering manager Violence: Not on file   Family History  Problem Relation Age of Onset   Diabetes Mother    Hypertension Father     Colon cancer Neg Hx    Esophageal cancer Neg Hx    Rectal cancer Neg Hx    Stomach cancer Neg Hx    Breast cancer Neg Hx     Objective: Office vital signs reviewed. BP 115/70   Pulse 83   Temp 98.6 F (37 C)   Ht 5\' 4"  (1.626 m)   Wt 147 lb (66.7 kg)   SpO2 97%   BMI 25.23 kg/m   Physical Examination:  General: Awake, alert, well nourished, No acute distress HEENT: sclera white, MMM Cardio: regular rate and rhythm, S1S2 heard, no murmurs appreciated Pulm: Mild expiratory wheezes appreciated globally.  No rhonchi or rales; normal work of breathing on room air  Assessment/ Plan: 62 y.o. female   Type 2 diabetes mellitus with other specified complication, without long-term current use of insulin - Plan:  Bayer DCA Hb A1c Waived, Microalbumin / creatinine urine ratio  Hypertension associated with diabetes  Hyperlipidemia associated with type 2 diabetes mellitus  Tobacco use  Sugar remains under good control with A1c of 6.1 today.  No medication is needed for diabetes at this time  Blood pressure well-controlled.  No changes  Continue statin.  Not due for fasting lipid  Actively working on tobacco cessation.  Recently started Chantix.  Expiratory wheezes noted on exam.    Orders Placed This Encounter  Procedures   Varicella-zoster vaccine IM   Bayer DCA Hb A1c Waived   Microalbumin / creatinine urine ratio   No orders of the defined types were placed in this encounter.    Raliegh Ip, DO [facility] [phone]

## 2022-10-11 ENCOUNTER — Ambulatory Visit (INDEPENDENT_AMBULATORY_CARE_PROVIDER_SITE_OTHER): Payer: Medicare Other | Admitting: Pharmacist

## 2022-10-11 DIAGNOSIS — F17210 Nicotine dependence, cigarettes, uncomplicated: Secondary | ICD-10-CM | POA: Diagnosis not present

## 2022-10-11 DIAGNOSIS — Z720 Tobacco use: Secondary | ICD-10-CM

## 2022-10-11 LAB — MICROALBUMIN / CREATININE URINE RATIO
Creatinine, Urine: 56.2 mg/dL
Microalb/Creat Ratio: <5 mg/g creat (ref 0–29)
Microalbumin, Urine: <3 ug/mL

## 2022-10-11 NOTE — Progress Notes (Signed)
   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 PMH is significant for HTN, T2DM, HLD, tobacco use, and back pain.   Patient states she started Chantix yesterday. Denies GI side effects. Endorses understanding to increase to 1 tablet BID with food after 1 week. States she had a delay in starting Chantix due to her dog passing away. Now that she has had time to grieve, she is ready to start working towards tobacco intake reduction. Tobacco intake slightly increased since last visit.   Age when started using tobacco on a daily basis 62 YO. Brand smoked Montego (not menthol). Number of cigarettes/day 10-15.  Estimated nicotine content per cigarette (mg) 1.  Estimated nicotine intake per day >10 mg.   Denies waking to smoke  Most recent quit attempt: September 2020  Longest time ever been tobacco free: 3 months.  Medications used in past cessation efforts include: patches (worked for a little while)   Rates IMPORTANCE of quitting tobacco on 1-10 scale of 8. Rates CONFIDENCE of quitting tobacco on 1-10 scale of 5.  Most common triggers to use tobacco include; stress, she is in a routine, feels like it's a dessert to her Lives alone with her 1 dogs and 4 cats    Motivation to quit: health (dont want to be on oxygen), coming off BP medications   O: Clinical ASCVD: No  The 10-year ASCVD risk score (Arnett DK, et al., 2019) is: 12%   Values used to calculate the score:     Age: 19 years     Sex: Female     Is Non-Hispanic African American: No     Diabetic: Yes     Tobacco smoker: Yes     Systolic Blood Pressure: 115 mmHg     Is BP treated: Yes     HDL Cholesterol: 62 mg/dL     Total Cholesterol: 160 mg/dL   A/P: 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  Set goal of cutting  down by 1 cigarette/week.  -Continue Chantix 0.5 mg by mouth once daily with food x7 days, then 0.5 mg by mouth twice daily with food thereafter. Patient counseled on purpose, proper use, and potential adverse effects, including GI upset. -Provided information on 1 800-QUIT NOW support program.   Written patient instructions provided. Patient verbalized understanding of treatment plan.  Total time in face to face counseling 25 minutes.    Follow-up:  Pharmacist 1 month. PCP clinic visit 01/16/2023  Valeda Malm, Pharm.D. PGY-2 Ambulatory Care Pharmacy Resident  Kieth Brightly, PharmD, BCACP Clinical Pharmacist, [facility]

## 2022-11-15 ENCOUNTER — Ambulatory Visit (INDEPENDENT_AMBULATORY_CARE_PROVIDER_SITE_OTHER): Payer: Medicare Other | Admitting: Pharmacist

## 2022-11-15 DIAGNOSIS — Z720 Tobacco use: Secondary | ICD-10-CM

## 2022-11-15 MED ORDER — VARENICLINE TARTRATE 0.5 MG PO TABS: ORAL_TABLET | ORAL | 2 refills | Status: DC

## 2022-11-15 NOTE — Progress Notes (Unsigned)
.    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 PMH is significant for HTN, T2DM, HLD, tobacco use, and back pain.   Patient states she stopped taking Chantix as she is not yet willing to quit and did not want to waste the medication. Tobacco intake has remained consistent at 10-12 cigarettes/day. Reports she is having lower back pain due to sciatica issues.   Age when started using tobacco on a daily basis 62 YO. Brand smoked Montego (not menthol). Number of cigarettes/day 10-12.  Estimated nicotine content per cigarette (mg) 1.  Estimated nicotine intake per day >10 mg.   Denies waking to smoke  Most recent quit attempt: September 2020  Longest time ever been tobacco free: 3 months.  Medications used in past cessation efforts include: patches (worked for a little while)   Rates IMPORTANCE of quitting tobacco on 1-10 scale of 8. Rates CONFIDENCE of quitting tobacco on 1-10 scale of 5.  Most common triggers to use tobacco include; stress, she is in a routine, feels like it's a dessert to her Lives alone with her 1 dogs and 4 cats    Motivation to quit: health (dont want to be on oxygen), coming off BP medications   O: Clinical ASCVD: No  The 10-year ASCVD risk score (Arnett DK, et al., 2019) is: 12%   Values used to calculate the score:     Age: 21 years     Sex: Female     Is Non-Hispanic African American: No     Diabetic: Yes     Tobacco smoker: Yes     Systolic Blood Pressure: 115 mmHg     Is BP treated: Yes     HDL Cholesterol: 62 mg/dL     Total Cholesterol: 160 mg/dL   A/P: 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 However, she states today she is not willing to quit yet, but is agreeable to try and cut down. Will have patient schedule follow  up visit when she is willing to quit.  -Encouraged patient to restart Chantix when she is ready to work towards quitting again. Will send in refill to her pharmacy so she can have one on file.  -Provided information on 1 800-QUIT NOW support program.   Written patient instructions provided. Patient verbalized understanding of treatment plan.  Total time in face to face counseling 25 minutes.    Follow-up:  Pharmacist PRN PCP clinic visit 01/16/2023  Valeda Malm, Pharm.D. PGY-2 Ambulatory Care Pharmacy Resident

## 2022-12-21 ENCOUNTER — Ambulatory Visit (INDEPENDENT_AMBULATORY_CARE_PROVIDER_SITE_OTHER): Payer: Medicare Other

## 2022-12-21 DIAGNOSIS — E1169 Type 2 diabetes mellitus with other specified complication: Secondary | ICD-10-CM

## 2022-12-21 DIAGNOSIS — J410 Simple chronic bronchitis: Secondary | ICD-10-CM

## 2022-12-21 NOTE — Chronic Care Management (AMB) (Signed)
Chronic Care Management   CCM RN Visit Note  12/21/2022 Name: Shenell Leith MRN: [id number] DOB: [birth year]  Subjective: Nancy Thornton is a 62 y.o. year old female who is a primary care patient of Raliegh Ip, DO. The patient was referred to the Chronic Care Management team for assistance with care management needs subsequent to provider initiation of CCM services and plan of care.    Today's Visit:  Engaged with patient by telephone for follow up visit.        Goals Addressed             This Visit's Progress    CCM (COPD) EXPECTED OUTCOME: MONITOR, SELF-MANAGE AND REDUCE SYMPTOMS OF COPD       Current Barriers:  Knowledge Deficits related to COPD management Chronic Disease Management support and education needs related to COPD, smoking cessation No Advanced Directives in place- information has been mailed Patient reports she has all medications and taking as prescribed including inhalers, pt states she smokes 10 cigarettes per day and has tried varying smoking cessation interventions including patches, states she has cut down a lot and will work on this in her own time and how she is best able to do it.  Patient reports she is now working with pharmacist on smoking cessation and is smoking 10 cigarettes per day, has Chantix on hand but has not started yet, no changes with smoking cessation reported, states " I'm not ready yet but I will be" Patient reports breathing is "good"  Planned Interventions: Advised patient to track and manage COPD triggers Advised patient to self assesses COPD action plan zone and make appointment with provider if in the yellow zone for 48 hours without improvement Advised patient to engage in light exercise as tolerated 3-5 days a week to aid in the the management of COPD Provided education about and advised patient to utilize infection prevention strategies to reduce risk of respiratory infection Discussed the importance of adequate rest and  management of fatigue with COPD Reviewed energy conservation Reinforced being careful outside with elevated temperatures  Symptom Management: Take medications as prescribed   Attend all scheduled provider appointments Call pharmacy for medication refills 3-7 days in advance of running out of medications Attend church or other social activities Perform all self care activities independently  Perform IADL's (shopping, preparing meals, housekeeping, managing finances) independently Call provider office for new concerns or questions  eliminate smoking in my home identify and remove indoor air pollutants limit outdoor activity during cold weather listen for public air quality announcements every day do breathing exercises every day develop a rescue plan follow rescue plan if symptoms flare-up don't eat or exercise right before bedtime get at least 7 to 8 hours of sleep at night practice relaxation or meditation daily Alternate activity with rest Practice good handwashing, wear a mask as needed, avoid sick people The weather is hotter, be mindful of this if outdoors as this can be a trigger with COPD  Follow Up Plan: Telephone follow up appointment with care management team member scheduled for:   03/14/23 at 3 pm       CCM (DIABETES) EXPECTED OUTCOME:  MONITOR, SELF-MANAGE AND REDUCE SYMPTOMS OF DIABETES       Current Barriers:  Knowledge Deficits related to Diabetes management Chronic Disease Management support and education needs related to Diabetes and diet No Advanced Directives in place- information previously mailed Patient reports she lives alone, has adult daughter she can call of if needed, has not  been checking CBG, reports " I check only once in awhile", states AIC "is very good and I'm not on medication for diabetes now", AIC on 10/10/22 is 6.1, has CBG and blood pressure checked at [address] once weekly when she visits for Bible study Patient reports she works part time and  walks a lot at work Pharmacist is currently working with patient Patient reports weight is unchanged and steady now at 146 pounds Patient reports she is trying to be more mindful of carbohydrate intake  Planned Interventions: Reviewed medications with patient and discussed importance of medication adherence;        Reviewed prescribed diet with patient carbohydrate modified; Counseled on importance of regular laboratory monitoring as prescribed;        Advised patient, providing education and rationale, to check cbg per MD order  and record        call provider for findings outside established parameters;       Review of patient status, including review of consultants reports, relevant laboratory and other test results, and medications completed;       Advised patient to discuss any issues with blood sugar, medications with provider;      Reinforced importance of exercise and being consistent  Symptom Management: Take medications as prescribed   Attend all scheduled provider appointments Call pharmacy for medication refills 3-7 days in advance of running out of medications Attend church or other social activities Perform all self care activities independently  Perform IADL's (shopping, preparing meals, housekeeping, managing finances) independently Call provider office for new concerns or questions  check blood sugar at prescribed times: per MD order  check feet daily for cuts, sores or redness enter blood sugar readings and medication or insulin into daily log take the blood sugar log to all doctor visits take the blood sugar meter to all doctor visits trim toenails straight across drink 6 to 8 glasses of water each day eat fish at least once per week fill half of plate with vegetables manage portion size read food labels for fat, fiber, carbohydrates and portion size set a realistic goal keep feet up while sitting wash and dry feet carefully every day wear comfortable, cotton  socks wear comfortable, well-fitting shoes Try to exercise daily Please start checking blood sugar again consistently Be mindful of how many carbohydrates you are eating at each meal as per our discussion  Follow Up Plan: Telephone follow up appointment with care management team member scheduled for:   03/14/23 at 3 pm          Plan:Telephone follow up appointment with care management team member scheduled for:  03/14/23 at 3 pm  Irving [facility], BSN RN Case Manager [facility] [phone]

## 2022-12-21 NOTE — Patient Instructions (Signed)
Please call the care guide team at [phone] if you need to cancel or reschedule your appointment.   If you are experiencing a Mental Health or Behavioral Health Crisis or need someone to talk to, please call the Suicide and Crisis Lifeline: 988 call the [place] National Suicide Prevention Lifeline: 917-527-6837 or TTY: (815)258-1463 TTY 506-515-6741) to talk to a trained counselor call 1-800-273-TALK (toll free, 24 hour hotline) go to [facility] [address] [place] [phone]) call the [facility]: [phone] call 911   Following is a copy of the CCM Program Consent:  CCM service includes personalized support from designated clinical staff supervised by the physician, including individualized plan of care and coordination with other care providers 24/7 contact phone numbers for assistance for urgent and routine care needs. Service will only be billed when office clinical staff spend 20 minutes or more in a month to coordinate care. Only one practitioner may furnish and bill the service in a calendar month. The patient may stop CCM services at amy time (effective at the end of the month) by phone call to the office staff. The patient will be responsible for cost sharing (co-pay) or up to 20% of the service fee (after annual deductible is met)  Following is a copy of your full provider care plan:   Goals Addressed             This Visit's Progress    CCM (COPD) EXPECTED OUTCOME: MONITOR, SELF-MANAGE AND REDUCE SYMPTOMS OF COPD       Current Barriers:  Knowledge Deficits related to COPD management Chronic Disease Management support and education needs related to COPD, smoking cessation No Advanced Directives in place- information has been mailed Patient reports she has all medications and taking as prescribed including inhalers, pt states she smokes 10 cigarettes per day and has tried varying smoking cessation  interventions including patches, states she has cut down a lot and will work on this in her own time and how she is best able to do it.  Patient reports she is now working with pharmacist on smoking cessation and is smoking 10 cigarettes per day, has Chantix on hand but has not started yet, no changes with smoking cessation reported, states " I'm not ready yet but I will be" Patient reports breathing is "good"  Planned Interventions: Advised patient to track and manage COPD triggers Advised patient to self assesses COPD action plan zone and make appointment with provider if in the yellow zone for 48 hours without improvement Advised patient to engage in light exercise as tolerated 3-5 days a week to aid in the the management of COPD Provided education about and advised patient to utilize infection prevention strategies to reduce risk of respiratory infection Discussed the importance of adequate rest and management of fatigue with COPD Reviewed energy conservation Reinforced being careful outside with elevated temperatures  Symptom Management: Take medications as prescribed   Attend all scheduled provider appointments Call pharmacy for medication refills 3-7 days in advance of running out of medications Attend church or other social activities Perform all self care activities independently  Perform IADL's (shopping, preparing meals, housekeeping, managing finances) independently Call provider office for new concerns or questions  eliminate smoking in my home identify and remove indoor air pollutants limit outdoor activity during cold weather listen for public air quality announcements every day do breathing exercises every day develop a rescue plan follow rescue plan if symptoms flare-up don't eat or exercise  right before bedtime get at least 7 to 8 hours of sleep at night practice relaxation or meditation daily Alternate activity with rest Practice good handwashing, wear a mask as  needed, avoid sick people The weather is hotter, be mindful of this if outdoors as this can be a trigger with COPD  Follow Up Plan: Telephone follow up appointment with care management team member scheduled for:   03/14/23 at 3 pm       CCM (DIABETES) EXPECTED OUTCOME:  MONITOR, SELF-MANAGE AND REDUCE SYMPTOMS OF DIABETES       Current Barriers:  Knowledge Deficits related to Diabetes management Chronic Disease Management support and education needs related to Diabetes and diet No Advanced Directives in place- information previously mailed Patient reports she lives alone, has adult daughter she can call of if needed, has not been checking CBG, reports " I check only once in awhile", states AIC "is very good and I'm not on medication for diabetes now", AIC on 10/10/22 is 6.1, has CBG and blood pressure checked at [address] once weekly when she visits for Bible study Patient reports she works part time and walks a lot at work Pharmacist is currently working with patient Patient reports weight is unchanged and steady now at 146 pounds Patient reports she is trying to be more mindful of carbohydrate intake  Planned Interventions: Reviewed medications with patient and discussed importance of medication adherence;        Reviewed prescribed diet with patient carbohydrate modified; Counseled on importance of regular laboratory monitoring as prescribed;        Advised patient, providing education and rationale, to check cbg per MD order  and record        call provider for findings outside established parameters;       Review of patient status, including review of consultants reports, relevant laboratory and other test results, and medications completed;       Advised patient to discuss any issues with blood sugar, medications with provider;      Reinforced importance of exercise and being consistent  Symptom Management: Take medications as prescribed   Attend all scheduled provider  appointments Call pharmacy for medication refills 3-7 days in advance of running out of medications Attend church or other social activities Perform all self care activities independently  Perform IADL's (shopping, preparing meals, housekeeping, managing finances) independently Call provider office for new concerns or questions  check blood sugar at prescribed times: per MD order  check feet daily for cuts, sores or redness enter blood sugar readings and medication or insulin into daily log take the blood sugar log to all doctor visits take the blood sugar meter to all doctor visits trim toenails straight across drink 6 to 8 glasses of water each day eat fish at least once per week fill half of plate with vegetables manage portion size read food labels for fat, fiber, carbohydrates and portion size set a realistic goal keep feet up while sitting wash and dry feet carefully every day wear comfortable, cotton socks wear comfortable, well-fitting shoes Try to exercise daily Please start checking blood sugar again consistently Be mindful of how many carbohydrates you are eating at each meal as per our discussion  Follow Up Plan: Telephone follow up appointment with care management team member scheduled for:   03/14/23 at 3 pm          Patient verbalizes understanding of instructions and care plan provided today and agrees to view in MyChart. Active  MyChart status and patient understanding of how to access instructions and care plan via MyChart confirmed with patient.  Telephone follow up appointment with care management team member scheduled for:  03/14/23 at 3 pm

## 2023-01-01 DIAGNOSIS — J449 Chronic obstructive pulmonary disease, unspecified: Secondary | ICD-10-CM | POA: Diagnosis not present

## 2023-01-01 DIAGNOSIS — E1159 Type 2 diabetes mellitus with other circulatory complications: Secondary | ICD-10-CM | POA: Diagnosis not present

## 2023-01-16 ENCOUNTER — Ambulatory Visit: Payer: Medicare Other | Admitting: Family Medicine

## 2023-01-16 VITALS — BP 98/63 | HR 55 | Temp 98.4°F | Ht 64.0 in | Wt 144.8 lb

## 2023-01-16 DIAGNOSIS — Z720 Tobacco use: Secondary | ICD-10-CM | POA: Diagnosis not present

## 2023-01-16 DIAGNOSIS — Z9889 Other specified postprocedural states: Secondary | ICD-10-CM | POA: Diagnosis not present

## 2023-01-16 DIAGNOSIS — L84 Corns and callosities: Secondary | ICD-10-CM

## 2023-01-16 DIAGNOSIS — M549 Dorsalgia, unspecified: Secondary | ICD-10-CM

## 2023-01-16 DIAGNOSIS — E1169 Type 2 diabetes mellitus with other specified complication: Secondary | ICD-10-CM

## 2023-01-16 DIAGNOSIS — I152 Hypertension secondary to endocrine disorders: Secondary | ICD-10-CM

## 2023-01-16 DIAGNOSIS — E785 Hyperlipidemia, unspecified: Secondary | ICD-10-CM | POA: Diagnosis not present

## 2023-01-16 DIAGNOSIS — E1159 Type 2 diabetes mellitus with other circulatory complications: Secondary | ICD-10-CM

## 2023-01-16 DIAGNOSIS — E119 Type 2 diabetes mellitus without complications: Secondary | ICD-10-CM

## 2023-01-16 LAB — BAYER DCA HB A1C WAIVED: HB A1C (BAYER DCA - WAIVED): 6 % — ABNORMAL HIGH (ref 4.8–5.6)

## 2023-01-16 MED ORDER — ATENOLOL 50 MG PO TABS: 50.0000 mg | ORAL_TABLET | Freq: Every day | ORAL | 3 refills | Status: DC

## 2023-01-16 MED ORDER — GABAPENTIN 300 MG PO CAPS: 300.0000 mg | ORAL_CAPSULE | Freq: Two times a day (BID) | ORAL | 3 refills | Status: DC

## 2023-01-16 MED ORDER — AMLODIPINE BESYLATE 2.5 MG PO TABS: 2.5000 mg | ORAL_TABLET | Freq: Every day | ORAL | 3 refills | Status: DC

## 2023-01-16 MED ORDER — ATORVASTATIN CALCIUM 20 MG PO TABS: 20.0000 mg | ORAL_TABLET | Freq: Every day | ORAL | 3 refills | Status: DC

## 2023-01-16 MED ORDER — LISINOPRIL 20 MG PO TABS: 20.0000 mg | ORAL_TABLET | Freq: Every day | ORAL | 3 refills | Status: DC

## 2023-01-16 NOTE — Progress Notes (Signed)
Subjective: CC:DM PCP: Raliegh Ip, DO Nancy Thornton is a 62 y.o. female presenting to clinic today for:  1. Type 2 Diabetes with hypertension, hyperlipidemia:  She has had diet-controlled diabetes.  She reports compliance with lisinopril, amlodipine, atenolol and Lipitor.  Last eye exam: Up-to-date Last foot exam: Up-to-date Last A1c:  Lab Results  Component Value Date   HGBA1C 6.0 (H) 01/16/2023   Nephropathy screen indicated?:  Up-to-date Last flu, zoster and/or pneumovax:  Immunization History  Administered Date(s) Administered   Influenza, High Dose Seasonal PF 03/30/2018   Influenza, Quadrivalent, Recombinant, Inj, Pf 03/20/2019, 03/14/2022   Influenza, Seasonal, Injecte, Preservative Fre 04/18/2013, 04/12/2014, 03/12/2015, 04/08/2017   Influenza,inj,Quad PF,6+ Mos 04/18/2013, 03/12/2015, 04/08/2017, 03/30/2018   Influenza-Unspecified 04/18/2013, 04/12/2014, 03/12/2015, 03/12/2015, 04/08/2017, 04/08/2017, 03/30/2018, 03/20/2019, 04/25/2020   Moderna Sars-Covid-2 Vaccination 09/20/2019, 10/22/2019, 02/24/2020, 04/25/2020   Pneumococcal Conjugate-13 09/05/2013   Pneumococcal Polysaccharide-23 03/12/2015   Respiratory Syncytial Virus Vaccine,Recomb Aduvanted(Arexvy) 03/14/2022   Td 01/09/2017   Tdap 01/09/2017   Zoster Recombinant(Shingrix) 03/14/2022, 10/10/2022    ROS: No chest pain, shortness of breath, dizziness.  Blood pressures are typically running less than 110 over 70s at work.  She reports callus formation on bilateral plantar aspects of the feet and would like to see podiatry for this.  Previously under the care of Dr. Ulice Brilliant and would like appointment with him  2.  Back pain with history of spinal surgery Patient with ongoing left-sided lower extremity pain that is coming from her back.  This is unchanged and fairly managed with gabapentin.  She typically only takes this at bedtime but does have twice daily dosing available to her if needed.  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    Current Outpatient Medications:    acetaminophen (TYLENOL) 500 MG tablet, Take 1,000 mg by mouth at bedtime., Disp: , Rfl:    albuterol (VENTOLIN HFA) 108 (90 Base) MCG/ACT inhaler, INHALE 2 PUFFS BY MOUTH EVERY 6 HOURS AS NEEDED FOR WHEEZING AND FOR SHORTNESS OF BREATH (Patient taking differently: Inhale 2 puffs into the lungs at bedtime.), Disp: 9 g, Rfl: 0   amLODipine (NORVASC) 5 MG tablet, Take 1 tablet (5 mg total) by mouth daily., Disp: 90 tablet, Rfl: 3   atenolol (TENORMIN) 50 MG tablet, Take 1 tablet (50 mg total) by mouth daily., Disp: 90 tablet, Rfl: 3   atorvastatin (LIPITOR) 20 MG tablet, Take 1 tablet (20 mg total) by mouth daily., Disp: 90 tablet, Rfl: 3   diphenhydrAMINE (BENADRYL) 25 MG tablet, Take 25 mg by mouth at bedtime., Disp: , Rfl:    fluticasone (FLONASE) 50 MCG/ACT nasal spray, Place 2 sprays into both nostrils daily., Disp: 16 g, Rfl: 6   gabapentin (NEURONTIN) 300 MG capsule, Take 1 capsule (300 mg total) by mouth 2 (two) times daily., Disp: 180 capsule, Rfl: 3   lisinopril (ZESTRIL) 20 MG tablet, Take 1 tablet (20 mg total) by mouth daily., Disp: 90 tablet,  Rfl: 3   Melatonin 10 MG TABS, Take 10 mg by mouth at bedtime., Disp: , Rfl:    Omega 3-6-9 Fatty Acids (TRIPLE OMEGA COMPLEX PO), Take 1 capsule by mouth daily., Disp: , Rfl:    umeclidinium-vilanterol (ANORO ELLIPTA) 62.5-25 MCG/INH AEPB, Inhale 1 puff into the lungs daily., Disp: 28 each, Rfl: 0   varenicline (CHANTIX) 0.5 MG tablet,  Take 0.5mg  (1 tablet) by mouth daily for 7 days. 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  Tobacco Use   Smoking status: Every Day    Current packs/day: 0.00    Average packs/day: 0.5 packs/day for 41.3 years (20.7 ttl pk-yrs)    Types: Cigarettes    Start date: 10/18/1977    Last attempt to quit: 02/10/2019    Years since quitting: 3.9   Smokeless tobacco: Never  Vaping Use   Vaping status: Never Used  Substance and Sexual Activity   Alcohol use: No   Drug use: No   Sexual activity: Not Currently  Other Topics Concern   Not on file  Social History Narrative   Not on file   Social Determinants of Health   Financial Resource Strain: Low Risk  (01/16/2023)   Overall Financial Resource Strain (CARDIA)    Difficulty of Paying Living Expenses: Not hard at all  Food Insecurity: No Food Insecurity (01/16/2023)   Hunger Vital Sign    Worried About Running Out of Food in the Last Year: Never true    Ran Out of Food in the Last Year: Never true  Transportation Needs: No Transportation Needs (01/16/2023)   PRAPARE - Administrator, Civil Service (Medical): No    Lack of Transportation (Non-Medical): No  Physical Activity: Insufficiently Active (01/16/2023)   Exercise Vital Sign    Days of Exercise per Week: 2 days    Minutes of Exercise per Session: 10 min  Stress: No Stress Concern Present (01/16/2023)   [employer] of Occupational Health - Occupational Stress Questionnaire    Feeling of Stress : Not at all  Social Connections: Moderately Integrated (01/16/2023)   Social Connection and Isolation Panel [NHANES]    Frequency of Communication with Friends and  Family: Once a week    Frequency of Social Gatherings with Friends and Family: Twice a week    Attends Religious Services: 1 to 4 times per year    Active Member of [employer] or Organizations: Yes    Attends Banker Meetings: 1 to 4 times per year    Marital Status: Divorced  Intimate Partner Violence: Unknown (10/06/2021)   Received from [employer], [facility]   HITS    Physically Hurt: Not on file    Insult or Talk Down To: Not on file    Threaten Physical Harm: Not on file    Scream or Curse: Not on file   Family History  Problem Relation Age of Onset   Diabetes Mother    Hypertension Father    Colon cancer Neg Hx    Esophageal cancer Neg Hx    Rectal cancer Neg Hx    Stomach cancer Neg Hx  Breast cancer Neg Hx     Objective: Office vital signs reviewed. BP 98/63   Pulse (!) 55   Temp 98.4 F (36.9 C)   Ht 5\' 4"  (1.626 m)   Wt 144 lb 12.8 oz (65.7 kg)   SpO2 95%   BMI 24.85 kg/m   Physical Examination:  General: Awake, alert, well nourished, No acute distress HEENT: sclera white, MMM Cardio: Bradycardic with regular rhythm, S1S2 heard, no murmurs appreciated Pulm: Global expiratory wheezes noted throughout.  No rales or rhonchi.  Normal work of breathing on room air MSK: Ambulating independently with normal gait and station. Extremities: Warm, well-perfused.  No edema.  She has plantar calluses along the feet bilaterally at approximately the fourth and fifth metatarsals  Assessment/ Plan: 62 y.o. female   Diet-controlled diabetes mellitus (HCC) - Plan: Bayer DCA Hb A1c Waived, Ambulatory referral to Podiatry  Hypertension associated with diabetes (HCC) - Plan: amLODipine (NORVASC) 2.5 MG tablet, atenolol (TENORMIN) 50 MG tablet, lisinopril (ZESTRIL) 20 MG tablet  Hyperlipidemia associated with type 2 diabetes mellitus (HCC) - Plan: atorvastatin (LIPITOR) 20 MG tablet  Tobacco use  Plantar callus - Plan: Ambulatory referral to Podiatry  Back  pain with history of spinal surgery  Sugar remains diet controlled.  Referral to podiatry for plantar calluses.  May need debridement and/or diabetic shoes  Blood pressure little soft so would like her to reduce her amlodipine to 2.5 mg.  We will plan to discontinue this totally if blood pressure remains well-controlled.  Continue beta-blocker, ACE inhibitor for now  Continue statin  Counseled on smoking cessation.  She is contemplative  Chronic back pain stable with gabapentin.  No orders of the defined types were placed in this encounter.  No orders of the defined types were placed in this encounter.    Raliegh Ip, DO [facility] [phone]

## 2023-02-03 ENCOUNTER — Ambulatory Visit (INDEPENDENT_AMBULATORY_CARE_PROVIDER_SITE_OTHER): Payer: Medicare Other

## 2023-02-03 VITALS — Ht 65.0 in | Wt 146.0 lb

## 2023-02-03 DIAGNOSIS — Z0000 Encounter for general adult medical examination without abnormal findings: Secondary | ICD-10-CM | POA: Diagnosis not present

## 2023-02-03 NOTE — Progress Notes (Signed)
Subjective:   Nancy Thornton is a 62 y.o. female who presents for an Initial Medicare Annual Wellness Visit.  Visit Complete: Virtual  I connected with  [name] on 02/03/23 by a audio enabled telemedicine application and verified that I am speaking with the correct person using two identifiers.  Patient Location: Home  Provider Location: Home Office  I discussed the limitations of evaluation and management by telemedicine. The patient expressed understanding and agreed to proceed.  Patient Medicare AWV questionnaire was completed by the patient on 02/03/2023; I have confirmed that all information answered by patient is correct and no changes since this date.  Review of Systems    Vital Signs: Unable to obtain new vitals due to this being a telehealth visit.  Cardiac Risk Factors include: advanced age (>57men, >77 women);smoking/ tobacco exposure;dyslipidemia     Objective:    Today's Vitals   02/03/23 0859  Weight: 146 lb (66.2 kg)  Height: 5\' 5"  (1.651 m)   Body mass index is 24.3 kg/m.     02/03/2023    9:08 AM 06/08/2022   11:47 AM 05/17/2022    9:52 AM 11/21/2020   11:41 AM 01/28/2020    7:59 AM 03/09/2019    7:13 AM 03/06/2019   10:23 PM  Advanced Directives  Does Patient Have a Medical Advance Directive? No No No No No No No  Would patient like information on creating a medical advance directive? Yes (MAU/Ambulatory/Procedural Areas - Information given) No - Patient declined Yes (MAU/Ambulatory/Procedural Areas - Information given)  No - Patient declined No - Patient declined No - Patient declined    Current Medications (verified) Outpatient Encounter Medications as of 02/03/2023  Medication Sig   acetaminophen (TYLENOL) 500 MG tablet Take 1,000 mg by mouth at bedtime.   albuterol (VENTOLIN HFA) 108 (90 Base) MCG/ACT inhaler INHALE 2 PUFFS BY MOUTH EVERY 6 HOURS AS NEEDED FOR WHEEZING AND FOR SHORTNESS OF BREATH (Patient taking differently: Inhale 2 puffs into the  lungs at bedtime.)   amLODipine (NORVASC) 2.5 MG tablet Take 1 tablet (2.5 mg total) by mouth daily. **dose reduction   atenolol (TENORMIN) 50 MG tablet Take 1 tablet (50 mg total) by mouth daily.   atorvastatin (LIPITOR) 20 MG tablet Take 1 tablet (20 mg total) by mouth daily.   diphenhydrAMINE (BENADRYL) 25 MG tablet Take 25 mg by mouth at bedtime.   fluticasone (FLONASE) 50 MCG/ACT nasal spray Place 2 sprays into both nostrils daily.   gabapentin (NEURONTIN) 300 MG capsule Take 1 capsule (300 mg total) by mouth 2 (two) times daily.   lisinopril (ZESTRIL) 20 MG tablet Take 1 tablet (20 mg total) by mouth daily.   Melatonin 10 MG TABS Take 10 mg by mouth at bedtime.   Omega 3-6-9 Fatty Acids (TRIPLE OMEGA COMPLEX PO) Take 1 capsule by mouth daily.   umeclidinium-vilanterol (ANORO ELLIPTA) 62.5-25 MCG/INH AEPB Inhale 1 puff into the lungs daily.   No facility-administered encounter medications on file as of 02/03/2023.    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   Past  Surgical History:  Procedure Laterality Date   ANTERIOR LUMBAR FUSION N/A 03/06/2019   Procedure: L4-5 XLIF, L4-5 PSF, LEFT SYNOVIAL CYST RESECTION L4-5;  Surgeon: Venetia Night, MD;  Location: [facility];  Service: Neurosurgery;  Laterality: N/A;   BACK SURGERY     BIOPSY  01/28/2020   Procedure: BIOPSY;  Surgeon: Dolores Frame, MD;  Location: [facility];  Service: Gastroenterology;;   CHOLECYSTECTOMY     COLONOSCOPY WITH PROPOFOL N/A 01/28/2020   Procedure: COLONOSCOPY WITH PROPOFOL;  Surgeon: Dolores Frame, MD;  Location: [facility];  Service: Gastroenterology;  Laterality: N/A;  915   CYSTOSTOMY W/ BLADDER BIOPSY  1990   punctured hole in bladder, had open procedure to repair   ESOPHAGOGASTRODUODENOSCOPY (EGD) WITH PROPOFOL N/A 06/08/2022   Procedure: ESOPHAGOGASTRODUODENOSCOPY (EGD) WITH PROPOFOL;  Surgeon: Dolores Frame, MD;  Location: [facility];  Service: Gastroenterology;  Laterality: N/A;  2:30PM   MENISCUS REPAIR     TONSILLECTOMY     TOTAL VAGINAL HYSTERECTOMY     62 y/o. bleeding. 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  Tobacco Use   Smoking status: Every Day    Current packs/day: 0.00    Average packs/day: 0.5 packs/day for 41.3 years (20.7 ttl pk-yrs)    Types: Cigarettes    Start date: 10/18/1977    Last attempt to quit: 02/10/2019    Years since quitting: 3.9   Smokeless tobacco: Never  Vaping Use   Vaping status: Never Used  Substance and Sexual Activity   Alcohol use: No   Drug use: No   Sexual activity: Not Currently  Other Topics Concern   Not on file  Social History Narrative   Not on file   Social Determinants of Health   Financial Resource Strain: Low Risk  (02/03/2023)   Overall Financial Resource Strain (CARDIA)    Difficulty of Paying Living Expenses: Not hard at all  Food Insecurity: No Food Insecurity (02/03/2023)   Hunger Vital Sign    Worried About Running Out of Food in the Last Year: Never true    Ran Out of Food in the Last Year: Never true  Transportation Needs: No  Transportation Needs (02/03/2023)   PRAPARE - Administrator, Civil Service (Medical): No    Lack of Transportation (Non-Medical): No  Physical Activity: Insufficiently Active (02/03/2023)   Exercise Vital Sign    Days of Exercise per Week: 2 days    Minutes of Exercise per Session: 30 min  Stress: No Stress Concern Present (01/16/2023)   [employer] of Occupational Health - Occupational Stress Questionnaire    Feeling of Stress : Not at all  Social Connections: Moderately Integrated (02/03/2023)   Social Connection and Isolation Panel [NHANES]    Frequency of Communication with Friends and Family: More than three times a week  Frequency of Social Gatherings with Friends and Family: More than three times a week    Attends Religious Services: 1 to 4 times per year    Active Member of [employer] or Organizations: Yes    Attends Engineer, structural: More than 4 times per year    Marital Status: Divorced    Tobacco Counseling Ready to quit: No Counseling given: Not Answered   Clinical Intake:  Pre-visit preparation completed: Yes  Pain : No/denies pain     Nutritional Risks: None Diabetes: No  How often do you need to have someone help you when you read instructions, pamphlets, or other written materials from your doctor or pharmacy?: 1 - Never  Interpreter Needed?: No  Information entered by :: Renie Ora, LPN   Activities of Daily Living    02/03/2023    9:08 AM 05/17/2022    9:48 AM  In your present state of health, do you have any difficulty performing the following activities:  Hearing? 0 0  Vision? 0 0  Difficulty concentrating or making decisions? 0 0  Walking or climbing stairs? 0 0  Dressing or bathing? 0 0  Doing errands, shopping? 0 0  Preparing Food and eating ? N N  Using the Toilet? N N  In the past six months, have you accidently leaked urine? N N  Do you have problems with loss of bowel control? N N  Managing your Medications? N  N  Managing your Finances? N N  Housekeeping or managing your Housekeeping? N N    Patient Care Team: Raliegh Ip, DO as PCP - General (Family Medicine) Pricilla Riffle, MD as PCP - Cardiology (Cardiology) Danella Maiers, [facility] as Pharmacist (Family Medicine) Audrie Gallus, RN as [facility] Care Management  Indicate any recent Medical Services you may have received from other than [facility] providers in the past year (date may be approximate).     Assessment:   This is a routine wellness examination for Nicholas.  Hearing/Vision screen Vision Screening - Comments:: Wears rx glasses - up to date with routine eye exams with  Dr.Johnson   Dietary issues and exercise activities discussed:     Goals Addressed             This Visit's Progress    DIET - INCREASE WATER INTAKE         Depression Screen    02/03/2023    9:02 AM 07/06/2022   11:48 AM 06/15/2022    8:20 AM 05/17/2022    9:47 AM 03/15/2022   11:28 AM 11/18/2020   11:42 AM 10/28/2020    7:55 AM  PHQ 2/9 Scores  PHQ - 2 Score 0 0 0 0 0 0 0  PHQ- 9 Score  0 3  6      Fall Risk    02/03/2023    9:00 AM 01/16/2023   12:51 PM 06/15/2022    8:22 AM 05/17/2022    9:49 AM 03/15/2022   11:28 AM  Fall Risk   Falls in the past year? 0 0 0 0 0  Number falls in past yr: 0 0     Injury with Fall? 0 0     Risk for fall due to : No Fall Risks No Fall Risks     Follow up Falls prevention discussed Education provided       MEDICARE RISK AT HOME:  Medicare Risk at Home - 02/03/23 0900     Any  stairs in or around the home? No    If so, are there any without handrails? No    Home free of loose throw rugs in walkways, pet beds, electrical cords, etc? Yes    Adequate lighting in your home to reduce risk of falls? Yes    Life alert? No    Use of a cane, walker or w/c? No    Grab bars in the bathroom? No    Shower chair or bench in shower? No    Elevated toilet seat or a handicapped toilet? No              TIMED UP AND GO:  Was the test performed? No    Cognitive Function:        02/03/2023    9:08 AM  6CIT Screen  What Year? 0 points  What month? 0 points  What time? 0 points  Count back from 20 0 points  Months in reverse 0 points  Repeat phrase 0 points  Total Score 0 points    Immunizations Immunization History  Administered Date(s) Administered   Influenza, High Dose Seasonal PF 03/30/2018   Influenza, Quadrivalent, Recombinant, Inj, Pf 03/20/2019, 03/14/2022   Influenza, Seasonal, Injecte, Preservative Fre 04/18/2013, 04/12/2014, 03/12/2015, 04/08/2017   Influenza,inj,Quad PF,6+ Mos 04/18/2013, 03/12/2015, 04/08/2017, 03/30/2018   Influenza-Unspecified 04/18/2013, 04/12/2014, 03/12/2015, 03/12/2015, 04/08/2017, 04/08/2017, 03/30/2018, 03/20/2019, 04/25/2020   Moderna Sars-Covid-2 Vaccination 09/20/2019, 10/22/2019, 02/24/2020, 04/25/2020   Pneumococcal Conjugate-13 09/05/2013   Pneumococcal Polysaccharide-23 03/12/2015   Respiratory Syncytial Virus Vaccine,Recomb Aduvanted(Arexvy) 03/14/2022   Td 01/09/2017   Tdap 01/09/2017   Zoster Recombinant(Shingrix) 03/14/2022, 10/10/2022    TDAP status: Up to date  Flu Vaccine status: Up to date  Pneumococcal vaccine status: Up to date  Covid-19 vaccine status: Completed vaccines  Qualifies for Shingles Vaccine? Yes   Zostavax completed Yes   Shingrix Completed?: Yes  Screening Tests Health Maintenance  Topic Date Due   COVID-19 Vaccine (5 - 2023-24 season) 03/04/2022   INFLUENZA VACCINE  02/02/2023   HIV Screening  03/16/2023 (Originally 03/25/1976)   FOOT EXAM  03/16/2023   OPHTHALMOLOGY EXAM  04/07/2023   Lung Cancer Screening  04/22/2023   Diabetic kidney evaluation - eGFR measurement  07/07/2023   HEMOGLOBIN A1C  07/19/2023   Diabetic kidney evaluation - Urine ACR  10/10/2023   Medicare Annual Wellness (AWV)  02/03/2024   MAMMOGRAM  03/23/2024   DTaP/Tdap/Td (3 - Td or Tdap) 01/10/2027   Colonoscopy   01/27/2030   Hepatitis C Screening  Completed   Zoster Vaccines- Shingrix  Completed   HPV VACCINES  Aged Out    Health Maintenance  Health Maintenance Due  Topic Date Due   COVID-19 Vaccine (5 - 2023-24 season) 03/04/2022   INFLUENZA VACCINE  02/02/2023    Colorectal cancer screening: Type of screening: Colonoscopy. Completed 01/28/2020. Repeat every 10 years  Mammogram status: Completed 03/23/2022. Repeat every year  Bone Density status: Ordered not of age . Pt provided with contact info and advised to call to schedule appt.  Lung Cancer Screening: (Low Dose CT Chest recommended if Age 54-80 years, 20 pack-year currently smoking OR have quit w/in 15years.) does qualify.   Lung Cancer Screening Referral: completed 04/21/2022  Additional Screening:  Hepatitis C Screening: does not qualify;   Vision Screening: Recommended annual ophthalmology exams for early detection of glaucoma and other disorders of the eye. Is the patient up to date with their annual eye exam?  Yes  Who is the provider  or what is the name of the office in which the patient attends annual eye exams? Dr.Johnson  If pt is not established with a provider, would they like to be referred to a provider to establish care? No .   Dental Screening: Recommended annual dental exams for proper oral hygiene    Community Resource Referral / Chronic Care Management: CRR required this visit?  No   CCM required this visit?  No     Plan:     I have personally reviewed and noted the following in the patient's chart:   Medical and social history Use of alcohol, tobacco or illicit drugs  Current medications and supplements including opioid prescriptions. Patient is not currently taking opioid prescriptions. Functional ability and status Nutritional status Physical activity Advanced directives List of other physicians Hospitalizations, surgeries, and ER visits in previous 12 months Vitals Screenings to include  cognitive, depression, and falls Referrals and appointments  In addition, I have reviewed and discussed with patient certain preventive protocols, quality metrics, and best practice recommendations. A written personalized care plan for preventive services as well as general preventive health recommendations were provided to patient.     Lorrene Reid, LPN   3/0/8657   After Visit Summary: (MyChart) Due to this being a telephonic visit, the after visit summary with patients personalized plan was offered to patient via MyChart   Nurse Notes: none

## 2023-02-03 NOTE — Patient Instructions (Signed)
Ms. Picone , Thank you for taking time to come for your Medicare Wellness Visit. I appreciate your ongoing commitment to your health goals. Please review the following plan we discussed and let me know if I can assist you in the future.   Referrals/Orders/Follow-Ups/Clinician Recommendations: Aim for 30 minutes of exercise or brisk walking, 6-8 glasses of water, and 5 servings of fruits and vegetables each day.   This is a list of the screening recommended for you and due dates:  Health Maintenance  Topic Date Due   COVID-19 Vaccine (5 - 2023-24 season) 03/04/2022   Flu Shot  02/02/2023   HIV Screening  03/16/2023*   Complete foot exam   03/16/2023   Eye exam for diabetics  04/07/2023   Screening for Lung Cancer  04/22/2023   Yearly kidney function blood test for diabetes  07/07/2023   Hemoglobin A1C  07/19/2023   Yearly kidney health urinalysis for diabetes  10/10/2023   Medicare Annual Wellness Visit  02/03/2024   Mammogram  03/23/2024   DTaP/Tdap/Td vaccine (3 - Td or Tdap) 01/10/2027   Colon Cancer Screening  01/27/2030   Hepatitis C Screening  Completed   Zoster (Shingles) Vaccine  Completed   HPV Vaccine  Aged Out  *Topic was postponed. The date shown is not the original due date.    Advanced directives: (Provided) Advance directive discussed with you today. I have provided a copy for you to complete at home and have notarized. Once this is complete, please bring a copy in to our office so we can scan it into your chart. Information on Advanced Care Planning can be found at [facility] Advance Health Care Directives Advance Health Care Directives ([email or web address]    Next Medicare Annual Wellness Visit scheduled for next year: Yes  Preventive Care 40-64 Years, Female Preventive care refers to lifestyle choices and visits with your health care provider that can promote health and wellness. What does preventive care include? A yearly physical exam. This is also  called an annual well check. Dental exams once or twice a year. Routine eye exams. Ask your health care provider how often you should have your eyes checked. Personal lifestyle choices, including: Daily care of your teeth and gums. Regular physical activity. Eating a healthy diet. Avoiding tobacco and drug use. Limiting alcohol use. Practicing safe sex. Taking low-dose aspirin every day starting at age 19. What happens during an annual well check? The services and screenings done by your health care provider during your annual well check will depend on your age, overall health, lifestyle risk factors, and family history of disease. Counseling  Your health care provider may ask you questions about your: Alcohol use. Tobacco use. Drug use. Emotional well-being. Home and relationship well-being. Sexual activity. Eating habits. Work and work Astronomer. Screening  You may have the following tests or measurements: Height, weight, and BMI. Blood pressure. Lipid and cholesterol levels. These may be checked every 5 years, or more frequently if you are over 8 years old. Skin check. Lung cancer screening. You may have this screening every year starting at age 30 if you have a 30-pack-year history of smoking and currently smoke or have quit within the past 15 years. Fecal occult blood test (FOBT) of the stool. You may have this test every year starting at age 89. Flexible sigmoidoscopy or colonoscopy. You may have a sigmoidoscopy every 5 years or a colonoscopy every 10 years starting at age 64. Prostate cancer screening. Recommendations will  vary depending on your family history and other risks. Hepatitis C blood test. Hepatitis B blood test. Sexually transmitted disease (STD) testing. Diabetes screening. This is done by checking your blood sugar (glucose) after you have not eaten for a while (fasting). You may have this done every 1-3 years. Discuss your test results, treatment options,  and if necessary, the need for more tests with your health care provider. Vaccines  Your health care provider may recommend certain vaccines, such as: Influenza vaccine. This is recommended every year. Tetanus, diphtheria, and acellular pertussis (Tdap, Td) vaccine. You may need a Td booster every 10 years. Zoster vaccine. You may need this after age 23. Pneumococcal 13-valent conjugate (PCV13) vaccine. You may need this if you have certain conditions and have not been vaccinated. Pneumococcal polysaccharide (PPSV23) vaccine. You may need one or two doses if you smoke cigarettes or if you have certain conditions. Talk to your health care provider about which screenings and vaccines you need and how often you need them. This information is not intended to replace advice given to you by your health care provider. Make sure you discuss any questions you have with your health care provider. Document Released: 07/17/2015 Document Revised: 03/09/2016 Document Reviewed: 04/21/2015 Elsevier Interactive Patient Education  2017 [employer].  Fall Prevention in the Home Falls can cause injuries. They can happen to people of all ages. There are many things you can do to make your home safe and to help prevent falls. What can I do on the outside of my home? Regularly fix the edges of walkways and driveways and fix any cracks. Remove anything that might make you trip as you walk through a door, such as a raised step or threshold. Trim any bushes or trees on the path to your home. Use bright outdoor lighting. Clear any walking paths of anything that might make someone trip, such as rocks or tools. Regularly check to see if handrails are loose or broken. Make sure that both sides of any steps have handrails. Any raised decks and porches should have guardrails on the edges. Have any leaves, snow, or ice cleared regularly. Use sand or salt on walking paths during winter. Clean up any spills in your garage  right away. This includes oil or grease spills. What can I do in the bathroom? Use night lights. Install grab bars by the toilet and in the tub and shower. Do not use towel bars as grab bars. Use non-skid mats or decals in the tub or shower. If you need to sit down in the shower, use a plastic, non-slip stool. Keep the floor dry. Clean up any water that spills on the floor as soon as it happens. Remove soap buildup in the tub or shower regularly. Attach bath mats securely with double-sided non-slip rug tape. Do not have throw rugs and other things on the floor that can make you trip. What can I do in the bedroom? Use night lights. Make sure that you have a light by your bed that is easy to reach. Do not use any sheets or blankets that are too big for your bed. They should not hang down onto the floor. Have a firm chair that has side arms. You can use this for support while you get dressed. Do not have throw rugs and other things on the floor that can make you trip. What can I do in the kitchen? Clean up any spills right away. Avoid walking on wet floors. Keep items that  you use a lot in easy-to-reach places. If you need to reach something above you, use a strong step stool that has a grab bar. Keep electrical cords out of the way. Do not use floor polish or wax that makes floors slippery. If you must use wax, use non-skid floor wax. Do not have throw rugs and other things on the floor that can make you trip. What can I do with my stairs? Do not leave any items on the stairs. Make sure that there are handrails on both sides of the stairs and use them. Fix handrails that are broken or loose. Make sure that handrails are as long as the stairways. Check any carpeting to make sure that it is firmly attached to the stairs. Fix any carpet that is loose or worn. Avoid having throw rugs at the top or bottom of the stairs. If you do have throw rugs, attach them to the floor with carpet tape. Make  sure that you have a light switch at the top of the stairs and the bottom of the stairs. If you do not have them, ask someone to add them for you. What else can I do to help prevent falls? Wear shoes that: Do not have high heels. Have rubber bottoms. Are comfortable and fit you well. Are closed at the toe. Do not wear sandals. If you use a stepladder: Make sure that it is fully opened. Do not climb a closed stepladder. Make sure that both sides of the stepladder are locked into place. Ask someone to hold it for you, if possible. Clearly mark and make sure that you can see: Any grab bars or handrails. First and last steps. Where the edge of each step is. Use tools that help you move around (mobility aids) if they are needed. These include: Canes. Walkers. Scooters. Crutches. Turn on the lights when you go into a dark area. Replace any light bulbs as soon as they burn out. Set up your furniture so you have a clear path. Avoid moving your furniture around. If any of your floors are uneven, fix them. If there are any pets around you, be aware of where they are. Review your medicines with your doctor. Some medicines can make you feel dizzy. This can increase your chance of falling. Ask your doctor what other things that you can do to help prevent falls. This information is not intended to replace advice given to you by your health care provider. Make sure you discuss any questions you have with your health care provider. Document Released: 04/16/2009 Document Revised: 11/26/2015 Document Reviewed: 07/25/2014 Elsevier Interactive Patient Education  2017 [employer].

## 2023-02-07 DIAGNOSIS — M2042 Other hammer toe(s) (acquired), left foot: Secondary | ICD-10-CM | POA: Diagnosis not present

## 2023-02-07 DIAGNOSIS — M2041 Other hammer toe(s) (acquired), right foot: Secondary | ICD-10-CM | POA: Diagnosis not present

## 2023-02-07 DIAGNOSIS — M79676 Pain in unspecified toe(s): Secondary | ICD-10-CM | POA: Diagnosis not present

## 2023-02-17 ENCOUNTER — Telehealth: Payer: Self-pay | Admitting: Family Medicine

## 2023-02-17 NOTE — Telephone Encounter (Signed)
Pt called to let PCP know that she was at work today and had her BP checked and it was 106/68 and that is while moving around a lot/working. Says her pulse was 60. Needs advise on what PCP recommends.

## 2023-02-17 NOTE — Telephone Encounter (Signed)
Ok to stop Norvasc 2.5mg .  Monitor BP at home. Goal <140/90.

## 2023-02-20 ENCOUNTER — Other Ambulatory Visit: Payer: Self-pay | Admitting: Family Medicine

## 2023-02-20 DIAGNOSIS — Z1231 Encounter for screening mammogram for malignant neoplasm of breast: Secondary | ICD-10-CM

## 2023-02-20 NOTE — Telephone Encounter (Signed)
Pt has been notified she will call in a few weeks to update

## 2023-02-23 LAB — AMB RESULTS CONSOLE CBG: Glucose: 139

## 2023-02-28 ENCOUNTER — Ambulatory Visit (INDEPENDENT_AMBULATORY_CARE_PROVIDER_SITE_OTHER): Payer: Medicare Other | Admitting: Nurse Practitioner

## 2023-02-28 ENCOUNTER — Encounter: Payer: Self-pay | Admitting: Nurse Practitioner

## 2023-02-28 VITALS — BP 134/74 | HR 50 | Temp 97.4°F | Resp 20 | Ht 65.0 in | Wt 148.0 lb

## 2023-02-28 DIAGNOSIS — R300 Dysuria: Secondary | ICD-10-CM

## 2023-02-28 DIAGNOSIS — M545 Low back pain, unspecified: Secondary | ICD-10-CM | POA: Diagnosis not present

## 2023-02-28 LAB — MICROSCOPIC EXAMINATION
Bacteria, UA: NONE SEEN
Epithelial Cells (non renal): NONE SEEN /hpf (ref 0–10)
RBC, Urine: NONE SEEN /hpf (ref 0–2)
Renal Epithel, UA: NONE SEEN /hpf
WBC, UA: NONE SEEN /hpf (ref 0–5)
Yeast, UA: NONE SEEN

## 2023-02-28 LAB — URINALYSIS, COMPLETE
Bilirubin, UA: NEGATIVE
Glucose, UA: NEGATIVE
Ketones, UA: NEGATIVE
Leukocytes,UA: NEGATIVE
Nitrite, UA: NEGATIVE
Protein,UA: NEGATIVE
RBC, UA: NEGATIVE
Specific Gravity, UA: <1.005 — ABNORMAL LOW (ref 1.005–1.030)
Urobilinogen, Ur: 0.2 mg/dL (ref 0.2–1.0)
pH, UA: 5.5 (ref 5.0–7.5)

## 2023-02-28 MED ORDER — CYCLOBENZAPRINE HCL 5 MG PO TABS: 5.0000 mg | ORAL_TABLET | Freq: Three times a day (TID) | ORAL | 1 refills | Status: AC | PRN

## 2023-02-28 NOTE — Progress Notes (Signed)
Subjective:    Patient ID: [name], female    DOB: 03-23-1961, 62 y.o.   MRN: [id number]   Chief Complaint: Back Pain   Back Pain This is a new problem. The current episode started in the past 7 days. The problem occurs constantly. The problem has been waxing and waning since onset. The pain is present in the lumbar spine. The quality of the pain is described as shooting. The pain does not radiate. The pain is at a severity of 5/10. The pain is moderate. The pain is Worse during the night. The symptoms are aggravated by bending. Stiffness is present At night. Associated symptoms include a fever (99.9). Pertinent negatives include no bowel incontinence, dysuria, paresthesias, perianal numbness, tingling or weakness. She has tried nothing for the symptoms. The treatment provided no relief.    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  Gastrointestinal:  Negative for bowel incontinence.  Genitourinary:  Positive for frequency and urgency. Negative for dysuria.  Musculoskeletal:  Positive for back pain.  Neurological:  Negative for tingling, weakness and paresthesias.       Objective:    Physical Exam Vitals reviewed.  Constitutional:      Appearance: Normal appearance.  Cardiovascular:     Rate and Rhythm: Normal rate and regular rhythm.     Heart sounds: Normal heart sounds.  Pulmonary:     Effort: Pulmonary effort is normal.     Breath sounds: Normal breath sounds.  Musculoskeletal:     Comments: FROM of lumbar spine without pain (-) SLR bil Motor strength ans sensation distally intact  Skin:    General: Skin is warm.  Neurological:     General: No focal deficit present.     Mental Status: She is alert and oriented to person, place, and time.  Psychiatric:        Mood and Affect: Mood normal.        Behavior: Behavior normal.     BP 134/74   Pulse (!) 50   Temp (!) 97.4 F (36.3 C) (Temporal)   Resp 20   Ht 5\' 5"  (1.651 m)   Wt 148 lb (67.1 kg)   SpO2 100%   BMI 24.63 kg/m   Urine clear      Assessment & Plan:   [name] in today with chief complaint of Back Pain   1. Dysuria Urine clear - Urinalysis, Complete - Urine Culture  2. Acute right-sided low back pain without sciatica Moist heat Rest RTO prn - cyclobenzaprine (FLEXERIL) 5 MG tablet; Take 1 tablet (5 mg total) by mouth 3 (three) times daily as needed for muscle spasms.  Dispense: 30 tablet; Refill: 1  The above assessment and management plan was discussed with the patient. The patient verbalized understanding of and has agreed to the management plan. Patient is aware to call the clinic if symptoms persist or worsen. Patient is aware when to return to the clinic for a follow-up visit. Patient educated on when it is appropriate to go to the emergency department.   Mary-Margaret Daphine Deutscher, FNP

## 2023-02-28 NOTE — Patient Instructions (Signed)
Acute Back Pain, Adult Acute back pain is sudden and usually short-lived. It is often caused by an injury to the muscles and tissues in the back. The injury may result from: A muscle, tendon, or ligament getting overstretched or torn. Ligaments are tissues that connect bones to each other. Lifting something improperly can cause a back strain. Wear and tear (degeneration) of the spinal disks. Spinal disks are circular tissue that provide cushioning between the bones of the spine (vertebrae). Twisting motions, such as while playing sports or doing yard work. A hit to the back. Arthritis. You may have a physical exam, lab tests, and imaging tests to find the cause of your pain. Acute back pain usually goes away with rest and home care. Follow these instructions at home: Managing pain, stiffness, and swelling Take over-the-counter and prescription medicines only as told by your health care provider. Treatment may include medicines for pain and inflammation that are taken by mouth or applied to the skin, or muscle relaxants. Your health care provider may recommend applying ice during the first 24-48 hours after your pain starts. To do this: Put ice in a plastic bag. Place a towel between your skin and the bag. Leave the ice on for 20 minutes, 2-3 times a day. Remove the ice if your skin turns bright red. This is very important. If you cannot feel pain, heat, or cold, you have a greater risk of damage to the area. If directed, apply heat to the affected area as often as told by your health care provider. Use the heat source that your health care provider recommends, such as a moist heat pack or a heating pad. Place a towel between your skin and the heat source. Leave the heat on for 20-30 minutes. Remove the heat if your skin turns bright red. This is especially important if you are unable to feel pain, heat, or cold. You have a greater risk of getting burned. Activity  Do not stay in bed. Staying in  bed for more than 1-2 days can delay your recovery. Sit up and stand up straight. Avoid leaning forward when you sit or hunching over when you stand. If you work at a desk, sit close to it so you do not need to lean over. Keep your chin tucked in. Keep your neck drawn back, and keep your elbows bent at a 90-degree angle (right angle). Sit high and close to the steering wheel when you drive. Add lower back (lumbar) support to your car seat, if needed. Take short walks on even surfaces as soon as you are able. Try to increase the length of time you walk each day. Do not sit, drive, or stand in one place for more than 30 minutes at a time. Sitting or standing for long periods of time can put stress on your back. Do not drive or use heavy machinery while taking prescription pain medicine. Use proper lifting techniques. When you bend and lift, use positions that put less stress on your back: Bend your knees. Keep the load close to your body. Avoid twisting. Exercise regularly as told by your health care provider. Exercising helps your back heal faster and helps prevent back injuries by keeping muscles strong and flexible. Work with a physical therapist to make a safe exercise program, as recommended by your health care provider. Do any exercises as told by your physical therapist. Lifestyle Maintain a healthy weight. Extra weight puts stress on your back and makes it difficult to have good   posture. Avoid activities or situations that make you feel anxious or stressed. Stress and anxiety increase muscle tension and can make back pain worse. Learn ways to manage anxiety and stress, such as through exercise. General instructions Sleep on a firm mattress in a comfortable position. Try lying on your side with your knees slightly bent. If you lie on your back, put a pillow under your knees. Keep your head and neck in a straight line with your spine (neutral position) when using electronic equipment like  smartphones or pads. To do this: Raise your smartphone or pad to look at it instead of bending your head or neck to look down. Put the smartphone or pad at the level of your face while looking at the screen. Follow your treatment plan as told by your health care provider. This may include: Cognitive or behavioral therapy. Acupuncture or massage therapy. Meditation or yoga. Contact a health care provider if: You have pain that is not relieved with rest or medicine. You have increasing pain going down into your legs or buttocks. Your pain does not improve after 2 weeks. You have pain at night. You lose weight without trying. You have a fever or chills. You develop nausea or vomiting. You develop abdominal pain. Get help right away if: You develop new bowel or bladder control problems. You have unusual weakness or numbness in your arms or legs. You feel faint. These symptoms may represent a serious problem that is an emergency. Do not wait to see if the symptoms will go away. Get medical help right away. Call your local emergency services (911 in the [place] Do not drive yourself to the hospital. Summary Acute back pain is sudden and usually short-lived. Use proper lifting techniques. When you bend and lift, use positions that put less stress on your back. Take over-the-counter and prescription medicines only as told by your health care provider, and apply heat or ice as told. This information is not intended to replace advice given to you by your health care provider. Make sure you discuss any questions you have with your health care provider. Document Revised: 09/11/2020 Document Reviewed: 09/11/2020 Elsevier Patient Education  2024 Elsevier Inc.  

## 2023-03-01 LAB — URINE CULTURE

## 2023-03-08 NOTE — Progress Notes (Signed)
Cardiology Office Note    Date:  03/09/2023   ID:  [name], DOB 30-Dec-1960, MRN [id number]  PCP:  Raliegh Ip, DO  Cardiologist: Dietrich Pates, MD    F/U of HTN    History of Present Illness:    Nancy Thornton is a 62 y.o. 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    Past Surgical History:  Procedure Laterality Date   ANTERIOR LUMBAR FUSION N/A 03/06/2019   Procedure: L4-5 XLIF, L4-5 PSF, LEFT SYNOVIAL CYST RESECTION L4-5;  Surgeon: Venetia Night, MD;  Location: [facility];  Service: Neurosurgery;  Laterality: N/A;   BACK SURGERY     BIOPSY  01/28/2020   Procedure: BIOPSY;  Surgeon: Dolores Frame, MD;  Location: [facility];  Service: Gastroenterology;;   CHOLECYSTECTOMY     COLONOSCOPY WITH PROPOFOL N/A 01/28/2020   Procedure: COLONOSCOPY WITH  PROPOFOL;  Surgeon: Dolores Frame, MD;  Location: [facility];  Service: Gastroenterology;  Laterality: N/A;  915   CYSTOSTOMY W/ BLADDER BIOPSY  1990   punctured hole in bladder, had open procedure to repair   ESOPHAGOGASTRODUODENOSCOPY (EGD) WITH PROPOFOL N/A 06/08/2022   Procedure: ESOPHAGOGASTRODUODENOSCOPY (EGD) WITH PROPOFOL;  Surgeon: Dolores Frame, MD;  Location: [facility];  Service: Gastroenterology;  Laterality: N/A;  2:30PM   MENISCUS REPAIR     TONSILLECTOMY     TOTAL VAGINAL HYSTERECTOMY     62 y/o. bleeding. ovaries left in situ    Current Medications: Outpatient Medications Prior to Visit  Medication Sig Dispense Refill   acetaminophen (TYLENOL) 500 MG tablet Take 1,000 mg by mouth at bedtime.     albuterol (VENTOLIN HFA) 108 (90 Base) MCG/ACT inhaler INHALE 2 PUFFS BY MOUTH EVERY 6 HOURS AS NEEDED FOR WHEEZING AND FOR SHORTNESS OF BREATH (Patient taking differently: Inhale 2 puffs into the lungs at bedtime.) 9 g  0   atenolol (TENORMIN) 50 MG tablet Take 1 tablet (50 mg total) by mouth daily. 90 tablet 3   atorvastatin (LIPITOR) 20 MG tablet Take 1 tablet (20 mg total) by mouth daily. 90 tablet 3   cyclobenzaprine (FLEXERIL) 5 MG tablet Take 1 tablet (5 mg total) by mouth 3 (three) times daily as needed for muscle spasms. 30 tablet 1   diphenhydrAMINE (BENADRYL) 25 MG tablet Take 25 mg by mouth at bedtime.     fluticasone (FLONASE) 50 MCG/ACT nasal spray Place 2 sprays into both nostrils daily. 16 g 6   gabapentin (NEURONTIN) 300 MG capsule Take 1 capsule (300 mg total) by mouth 2 (two) times daily. 180 capsule 3   lisinopril (ZESTRIL) 20 MG tablet Take 1 tablet (20 mg total) by mouth daily. 90 tablet 3   Melatonin 10 MG TABS Take 10 mg by mouth at bedtime.     Omega 3-6-9 Fatty Acids (TRIPLE OMEGA COMPLEX PO) Take 1 capsule by mouth daily.     umeclidinium-vilanterol (ANORO ELLIPTA) 62.5-25 MCG/INH AEPB Inhale 1 puff into the lungs daily. 28 each  0   amLODipine (NORVASC) 2.5 MG tablet Take 1 tablet (2.5 mg total) by mouth daily. **dose reduction 90 tablet 3   No facility-administered medications prior to visit.     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  Tobacco Use   Smoking status: Every Day    Current packs/day: 0.00    Average packs/day: 0.5 packs/day for 41.3 years (20.7 ttl pk-yrs)    Types: Cigarettes    Start date: 10/18/1977    Last attempt to quit: 02/10/2019    Years since quitting: 4.0   Smokeless tobacco: Never  Vaping Use   Vaping status: Never Used  Substance and Sexual Activity   Alcohol use: No   Drug use: No   Sexual activity: Not Currently  Other Topics Concern   Not on file  Social History Narrative   Not on file   Social Determinants of Health   Financial Resource Strain: Low Risk  (02/03/2023)   Overall Financial Resource Strain (CARDIA)    Difficulty of Paying Living Expenses: Not hard at all  Food Insecurity: No Food Insecurity (02/03/2023)   Hunger Vital Sign    Worried About Running Out of Food in the Last Year: Never true    Ran Out of Food in the Last Year: Never true  Transportation Needs: No Transportation Needs (02/03/2023)   PRAPARE - Administrator, Civil Service (Medical): No    Lack of Transportation (Non-Medical): No  Physical Activity: Insufficiently Active (02/03/2023)   Exercise Vital Sign    Days of Exercise per Week: 2 days    Minutes of Exercise per Session: 30 min  Stress: No Stress Concern Present (01/16/2023)   [employer] of Occupational Health - Occupational Stress Questionnaire    Feeling of Stress : Not at all  Social Connections:  Moderately Integrated (02/03/2023)   Social Connection and Isolation Panel [NHANES]    Frequency of Communication with Friends and Family: More than three times a week    Frequency of Social Gatherings with Friends and  Family: More than three times a week    Attends Religious Services: 1 to 4 times per year    Active Member of Clubs or Organizations: Yes    Attends Engineer, structural: More than 4 times per year    Marital Status: Divorced     Family History:  The patient's family history includes Diabetes in her mother; Hypertension in her father.   Review of Systems:   Please see the history of present illness.     All other systems reviewed and are otherwise negative except as noted above.   Physical Exam:    VS:  BP 138/76   Pulse (!) 56   Ht 5' 5.5" (1.664 m)   Wt 149 lb (67.6 kg)   SpO2 96%   BMI 24.42 kg/m     General: Pt is a 62 yo in NAD  Neck: No carotid bruits. JVP not elevated  Lungs: CTA   Heart: Regular rate and rhythm. No murmurs, Abdomen: Soft, non-tender, No hepatomegaly.    Extremities: No lower extremity edema.  2+ PT pulses.   Wt Readings from Last 3 Encounters:  03/09/23 149 lb (67.6 kg)  02/28/23 148 lb (67.1 kg)  02/03/23 146 lb (66.2 kg)     Studies/Labs Reviewed:   EKG:  EKG is  ordered today. Sinus bradycardia 56 bpm   RBBB  Recent Labs: 04/21/2022: Hemoglobin 15.1; Platelets 248 07/06/2022: ALT 29; BUN 14; Creatinine, Ser 0.84; Potassium 5.2; Sodium 139; TSH 1.380   Lipid Panel    Component Value Date/Time   CHOL 160 03/15/2022 1019   CHOL 196 01/17/2013 1226   TRIG 112 03/15/2022 1019   TRIG 233 (H) 09/05/2013 1142   TRIG 176 (H) 01/17/2013 1226   HDL 62 03/15/2022 1019   HDL 58 09/05/2013 1142   HDL 57 01/17/2013 1226   CHOLHDL 2.6 03/15/2022 1019   LDLCALC 78 03/15/2022 1019   LDLCALC 58 09/05/2013 1142   LDLCALC 104 (H) 01/17/2013 1226   LDLDIRECT 54 02/16/2018 0903    Additional studies/ records that were  reviewed today include:    Plan:   1 HTN  FOllow for now   Has been labile   Goal 110s to 130/    2  CAD   Pt with CAD on CT scan   No symptoms of angina    FOllow   Rx risk factors   2  Hx of dizziness  This has been a chronic intermitt problem   Not orthostatic on check in past  Currently without complaints   Stay hydreated   Stay active   5 HL  LDL 78  HDL 62   one year ago   WIll need to check  lippomed, Lpa and ApoB  6  DM  A1C  is improved   6   Reviewed diet   WHole, minimally processed foods  F/U in 1 year        Your physician recommends that you continue on your current medications as directed. Please refer to the Current Medication list given to you today.  If you need a refill on your cardiac medications before your next appointment, please call your pharmacy.  No labs or tests today  Thank you for choosing [facility] !        Signed, Dietrich Pates, MD  03/09/2023 11:59 AM    [facility] [address] [place], [place] [address] Phone: [phone]

## 2023-03-09 ENCOUNTER — Ambulatory Visit: Payer: Medicare Other | Attending: Internal Medicine | Admitting: Internal Medicine

## 2023-03-09 ENCOUNTER — Other Ambulatory Visit (HOSPITAL_COMMUNITY)
Admission: RE | Admit: 2023-03-09 | Discharge: 2023-03-09 | Disposition: A | Discharge status: Home / Self Care | Payer: Medicare Other | Source: Ambulatory Visit | Attending: Internal Medicine | Admitting: Internal Medicine

## 2023-03-09 ENCOUNTER — Encounter: Payer: Self-pay | Admitting: Internal Medicine

## 2023-03-09 VITALS — BP 138/76 | HR 56 | Ht 65.5 in | Wt 149.0 lb

## 2023-03-09 DIAGNOSIS — E1169 Type 2 diabetes mellitus with other specified complication: Secondary | ICD-10-CM | POA: Diagnosis not present

## 2023-03-09 DIAGNOSIS — E785 Hyperlipidemia, unspecified: Secondary | ICD-10-CM | POA: Insufficient documentation

## 2023-03-09 DIAGNOSIS — I2510 Atherosclerotic heart disease of native coronary artery without angina pectoris: Secondary | ICD-10-CM | POA: Diagnosis not present

## 2023-03-09 NOTE — Patient Instructions (Signed)
Medication Instructions:  Your physician recommends that you continue on your current medications as directed. Please refer to the Current Medication list given to you today.  *If you need a refill on your cardiac medications before your next appointment, please call your pharmacy*   Lab Work: Your physician recommends that you return for lab work in: Today   If you have labs (blood work) drawn today and your tests are completely normal, you will receive your results only by: MyChart Message (if you have MyChart) OR A paper copy in the mail If you have any lab test that is abnormal or we need to change your treatment, we will call you to review the results.   Testing/Procedures: NONE    Follow-Up: At [facility], you and your health needs are our priority.  As part of our continuing mission to provide you with exceptional heart care, we have created designated Provider Care Teams.  These Care Teams include your primary Cardiologist (physician) and Advanced Practice Providers (APPs -  Physician Assistants and Nurse Practitioners) who all work together to provide you with the care you need, when you need it.  We recommend signing up for the patient portal called "MyChart".  Sign up information is provided on this After Visit Summary.  MyChart is used to connect with patients for Virtual Visits (Telemedicine).  Patients are able to view lab/test results, encounter notes, upcoming appointments, etc.  Non-urgent messages can be sent to your provider as well.   To learn more about what you can do with MyChart, go to [email or web address].    Your next appointment:    July/ August   Provider:   You may see Dietrich Pates, MD or one of the following Advanced Practice Providers on your designated Care Team:   Randall An, PA-C  Jacolyn Reedy, PA-C     Other Instructions Thank you for choosing [facility]!

## 2023-03-10 LAB — NMR, LIPOPROFILE
Cholesterol, Total: 139 mg/dL (ref 100–199)
HDL Cholesterol by NMR: 60 mg/dL (ref >39)
HDL Particle Number: 31.8 umol/L (ref >=30.5)
LDL Particle Number: 676 nmol/L (ref <1000)
LDL Size: 20.2 nm — ABNORMAL LOW (ref >20.5)
LDL-C (NIH Calc): 61 mg/dL (ref 0–99)
LP-IR Score: 33 (ref <=45)
Small LDL Particle Number: 311 nmol/L (ref <=527)
Triglycerides by NMR: 96 mg/dL (ref 0–149)

## 2023-03-10 LAB — MISC LABCORP TEST (SEND OUT): Labcorp test code: 167015

## 2023-03-11 LAB — LIPOPROTEIN A (LPA): Lipoprotein (a): <8.4 nmol/L (ref <75.0)

## 2023-03-14 ENCOUNTER — Telehealth: Payer: Medicare Other

## 2023-03-14 ENCOUNTER — Encounter: Payer: Self-pay | Admitting: *Deleted

## 2023-03-14 ENCOUNTER — Other Ambulatory Visit: Payer: Medicare Other | Admitting: *Deleted

## 2023-03-14 NOTE — Patient Outreach (Signed)
Care Management   Visit Note  03/14/2023 Name: Nancy Thornton MRN: [id number] DOB: [birth year]  Subjective: Nancy Thornton is a 62 y.o. year old female who is a primary care patient of Nancy Ip, DO. The Care Management team was consulted for assistance.      Engaged with patient spoke with patient by telephone for follow up   Goals Addressed             This Visit's Progress    COMPLETED: CCM (COPD) EXPECTED OUTCOME: MONITOR, SELF-MANAGE AND REDUCE SYMPTOMS OF COPD       Current Barriers:  Knowledge Deficits related to COPD management Chronic Disease Management support and education needs related to COPD, smoking cessation No Advanced Directives in place- information has been mailed Patient reports she has all medications and taking as prescribed including inhalers, pt states she smokes 10 cigarettes per day and has tried varying smoking cessation interventions including patches, states she has cut down a lot and will work on this in her own time and how she is best able to do it.  Patient reports she is now working with pharmacist on smoking cessation and is smoking 10 cigarettes per day, has Chantix on hand but has not started yet, no changes with smoking cessation reported, states " I'm not ready yet but I will be" Patient reports breathing is "good" Patient verbalizes no new concerns, still smoking and is considering quitting in the future  Planned Interventions: Advised patient to track and manage COPD triggers Advised patient to self assesses COPD action plan zone and make appointment with provider if in the yellow zone for 48 hours without improvement Advised patient to engage in light exercise as tolerated 3-5 days a week to aid in the the management of COPD Provided education about and advised patient to utilize infection prevention strategies to reduce risk of respiratory infection Discussed the importance of adequate rest and management of fatigue with  COPD Reviewed energy conservation Reviewed plan of care with patient including case closure  Symptom Management: Take medications as prescribed   Attend all scheduled provider appointments Call pharmacy for medication refills 3-7 days in advance of running out of medications Attend church or other social activities Perform all self care activities independently  Perform IADL's (shopping, preparing meals, housekeeping, managing finances) independently Call provider office for new concerns or questions  eliminate smoking in my home identify and remove indoor air pollutants limit outdoor activity during cold weather listen for public air quality announcements every day do breathing exercises every day develop a rescue plan follow rescue plan if symptoms flare-up don't eat or exercise right before bedtime get at least 7 to 8 hours of sleep at night practice relaxation or meditation daily Alternate activity with rest Practice good handwashing, wear a mask as needed, avoid sick people Please get your flu vaccine Case closure  Follow Up Plan: No further follow up required: case closure           COMPLETED: CCM (DIABETES) EXPECTED OUTCOME:  MONITOR, SELF-MANAGE AND REDUCE SYMPTOMS OF DIABETES       Current Barriers:  Knowledge Deficits related to Diabetes management Chronic Disease Management support and education needs related to Diabetes and diet No Advanced Directives in place- information previously mailed Patient reports she lives alone, has adult daughter she can call of if needed, has not been checking CBG, reports " I check only once in awhile", states AIC "is very good and I'm not on medication for diabetes now", [facility]  on 01/16/23 is 6.0 has CBG and blood pressure checked at [address] once weekly when she visits for Bible study Patient reports she works part time and walks a lot at work Pharmacist is currently working with patient Patient reports weight is unchanged and steady now  at 146 pounds Patient reports she is trying to be more mindful of carbohydrate intake No new concerns reported  Planned Interventions: Reviewed medications with patient and discussed importance of medication adherence;        Reviewed prescribed diet with patient carbohydrate modified; Counseled on importance of regular laboratory monitoring as prescribed;        Advised patient, providing education and rationale, to check cbg per MD order  and record        call provider for findings outside established parameters;       Review of patient status, including review of consultants reports, relevant laboratory and other test results, and medications completed;       Advised patient to discuss any issues with blood sugar, medications with provider;      Reviewed importance of exercise and being consistent  Symptom Management: Take medications as prescribed   Attend all scheduled provider appointments Call pharmacy for medication refills 3-7 days in advance of running out of medications Attend church or other social activities Perform all self care activities independently  Perform IADL's (shopping, preparing meals, housekeeping, managing finances) independently Call provider office for new concerns or questions  check blood sugar at prescribed times: per MD order  check feet daily for cuts, sores or redness enter blood sugar readings and medication or insulin into daily log take the blood sugar log to all doctor visits take the blood sugar meter to all doctor visits trim toenails straight across drink 6 to 8 glasses of water each day eat fish at least once per week fill half of plate with vegetables manage portion size read food labels for fat, fiber, carbohydrates and portion size set a realistic goal keep feet up while sitting wash and dry feet carefully every day wear comfortable, cotton socks wear comfortable, well-fitting shoes Try to exercise daily Please start checking blood  sugar again consistently Be mindful of how many carbohydrates you are eating at each meal as per our discussion Case closure  Follow Up Plan: No further follow up required: Case closure               Plan: No further follow up required: case closure  Irving Shows [facility], BSN [place]/ Ambulatory Care Management [phone]

## 2023-03-14 NOTE — Patient Instructions (Signed)
Visit Information  Thank you for taking time to visit with me today. Please don't hesitate to contact me if I can be of assistance to you before our next scheduled telephone appointment.  Following are the goals we discussed today:   Goals Addressed             This Visit's Progress    COMPLETED: CCM (COPD) EXPECTED OUTCOME: MONITOR, SELF-MANAGE AND REDUCE SYMPTOMS OF COPD       Current Barriers:  Knowledge Deficits related to COPD management Chronic Disease Management support and education needs related to COPD, smoking cessation No Advanced Directives in place- information has been mailed Patient reports she has all medications and taking as prescribed including inhalers, pt states she smokes 10 cigarettes per day and has tried varying smoking cessation interventions including patches, states she has cut down a lot and will work on this in her own time and how she is best able to do it.  Patient reports she is now working with pharmacist on smoking cessation and is smoking 10 cigarettes per day, has Chantix on hand but has not started yet, no changes with smoking cessation reported, states " I'm not ready yet but I will be" Patient reports breathing is "good" Patient verbalizes no new concerns, still smoking and is considering quitting in the future  Planned Interventions: Advised patient to track and manage COPD triggers Advised patient to self assesses COPD action plan zone and make appointment with provider if in the yellow zone for 48 hours without improvement Advised patient to engage in light exercise as tolerated 3-5 days a week to aid in the the management of COPD Provided education about and advised patient to utilize infection prevention strategies to reduce risk of respiratory infection Discussed the importance of adequate rest and management of fatigue with COPD Reviewed energy conservation Reviewed plan of care with patient including case closure  Symptom  Management: Take medications as prescribed   Attend all scheduled provider appointments Call pharmacy for medication refills 3-7 days in advance of running out of medications Attend church or other social activities Perform all self care activities independently  Perform IADL's (shopping, preparing meals, housekeeping, managing finances) independently Call provider office for new concerns or questions  eliminate smoking in my home identify and remove indoor air pollutants limit outdoor activity during cold weather listen for public air quality announcements every day do breathing exercises every day develop a rescue plan follow rescue plan if symptoms flare-up don't eat or exercise right before bedtime get at least 7 to 8 hours of sleep at night practice relaxation or meditation daily Alternate activity with rest Practice good handwashing, wear a mask as needed, avoid sick people Please get your flu vaccine Case closure  Follow Up Plan: No further follow up required: case closure           COMPLETED: CCM (DIABETES) EXPECTED OUTCOME:  MONITOR, SELF-MANAGE AND REDUCE SYMPTOMS OF DIABETES       Current Barriers:  Knowledge Deficits related to Diabetes management Chronic Disease Management support and education needs related to Diabetes and diet No Advanced Directives in place- information previously mailed Patient reports she lives alone, has adult daughter she can call of if needed, has not been checking CBG, reports " I check only once in awhile", states AIC "is very good and I'm not on medication for diabetes now", AIC on 01/16/23 is 6.0 has CBG and blood pressure checked at [address] once weekly when she visits for Bible  study Patient reports she works part time and walks a lot at work Pharmacist is currently working with patient Patient reports weight is unchanged and steady now at 146 pounds Patient reports she is trying to be more mindful of carbohydrate intake No new concerns  reported  Planned Interventions: Reviewed medications with patient and discussed importance of medication adherence;        Reviewed prescribed diet with patient carbohydrate modified; Counseled on importance of regular laboratory monitoring as prescribed;        Advised patient, providing education and rationale, to check cbg per MD order  and record        call provider for findings outside established parameters;       Review of patient status, including review of consultants reports, relevant laboratory and other test results, and medications completed;       Advised patient to discuss any issues with blood sugar, medications with provider;      Reviewed importance of exercise and being consistent  Symptom Management: Take medications as prescribed   Attend all scheduled provider appointments Call pharmacy for medication refills 3-7 days in advance of running out of medications Attend church or other social activities Perform all self care activities independently  Perform IADL's (shopping, preparing meals, housekeeping, managing finances) independently Call provider office for new concerns or questions  check blood sugar at prescribed times: per MD order  check feet daily for cuts, sores or redness enter blood sugar readings and medication or insulin into daily log take the blood sugar log to all doctor visits take the blood sugar meter to all doctor visits trim toenails straight across drink 6 to 8 glasses of water each day eat fish at least once per week fill half of plate with vegetables manage portion size read food labels for fat, fiber, carbohydrates and portion size set a realistic goal keep feet up while sitting wash and dry feet carefully every day wear comfortable, cotton socks wear comfortable, well-fitting shoes Try to exercise daily Please start checking blood sugar again consistently Be mindful of how many carbohydrates you are eating at each meal as per our  discussion Case closure  Follow Up Plan: No further follow up required: Case closure                Please call the care guide team at [phone] if you need to cancel or reschedule your appointment.   If you are experiencing a Mental Health or Behavioral Health Crisis or need someone to talk to, please call the Suicide and Crisis Lifeline: 988 call the [place] National Suicide Prevention Lifeline: (703) 477-8632 or TTY: 3050766657 TTY 262-061-9656) to talk to a trained counselor call 1-800-273-TALK (toll free, 24 hour hotline) go to [facility] [address], [place] [phone]) call the [facility] Crisis Line: 952-354-1830 call 911   Patient verbalizes understanding of instructions and care plan provided today and agrees to view in MyChart. Active MyChart status and patient understanding of how to access instructions and care plan via MyChart confirmed with patient.     No further follow up required: case closure  Nancy Thornton [facility], BSN / Ambulatory Care Management [phone]

## 2023-03-29 ENCOUNTER — Ambulatory Visit
Admission: RE | Admit: 2023-03-29 | Discharge: 2023-03-29 | Disposition: A | Discharge status: Home / Self Care | Payer: Medicare Other | Source: Ambulatory Visit | Attending: Family Medicine | Admitting: Family Medicine

## 2023-03-29 DIAGNOSIS — Z1231 Encounter for screening mammogram for malignant neoplasm of breast: Secondary | ICD-10-CM

## 2023-04-11 DIAGNOSIS — E119 Type 2 diabetes mellitus without complications: Secondary | ICD-10-CM | POA: Diagnosis not present

## 2023-04-11 DIAGNOSIS — H2513 Age-related nuclear cataract, bilateral: Secondary | ICD-10-CM | POA: Diagnosis not present

## 2023-04-11 LAB — HM DIABETES EYE EXAM

## 2023-06-20 ENCOUNTER — Ambulatory Visit (INDEPENDENT_AMBULATORY_CARE_PROVIDER_SITE_OTHER): Payer: Medicare Other | Admitting: Family Medicine

## 2023-06-20 ENCOUNTER — Encounter: Payer: Self-pay | Admitting: Family Medicine

## 2023-06-20 VITALS — BP 149/86 | HR 60 | Temp 98.5°F | Ht 65.0 in | Wt 149.0 lb

## 2023-06-20 DIAGNOSIS — M549 Dorsalgia, unspecified: Secondary | ICD-10-CM

## 2023-06-20 DIAGNOSIS — Z9889 Other specified postprocedural states: Secondary | ICD-10-CM

## 2023-06-20 DIAGNOSIS — E1159 Type 2 diabetes mellitus with other circulatory complications: Secondary | ICD-10-CM | POA: Diagnosis not present

## 2023-06-20 DIAGNOSIS — Z720 Tobacco use: Secondary | ICD-10-CM | POA: Diagnosis not present

## 2023-06-20 DIAGNOSIS — I2510 Atherosclerotic heart disease of native coronary artery without angina pectoris: Secondary | ICD-10-CM | POA: Insufficient documentation

## 2023-06-20 DIAGNOSIS — E1169 Type 2 diabetes mellitus with other specified complication: Secondary | ICD-10-CM

## 2023-06-20 DIAGNOSIS — J410 Simple chronic bronchitis: Secondary | ICD-10-CM

## 2023-06-20 DIAGNOSIS — E785 Hyperlipidemia, unspecified: Secondary | ICD-10-CM | POA: Diagnosis not present

## 2023-06-20 DIAGNOSIS — I152 Hypertension secondary to endocrine disorders: Secondary | ICD-10-CM | POA: Diagnosis not present

## 2023-06-20 LAB — BAYER DCA HB A1C WAIVED: HB A1C (BAYER DCA - WAIVED): 6 % — ABNORMAL HIGH (ref 4.8–5.6)

## 2023-06-20 MED ORDER — BREZTRI AEROSPHERE 160-9-4.8 MCG/ACT IN AERO
2.0000 | INHALATION_SPRAY | Freq: Two times a day (BID) | RESPIRATORY_TRACT | Status: DC
Start: 2023-06-20 — End: 2023-07-27

## 2023-06-20 NOTE — Progress Notes (Signed)
Subjective: CC:DM PCP: Nancy Ip, DO [id number] Nancy Thornton is a 62 y.o. female presenting to clinic today for:  1. Type 2 Diabetes with hypertension, hyperlipidemia:  Diet-controlled diabetes.  Admits that she continues to have some occasional sweets, particularly at nighttime but does not overindulge.  She is compliant with lisinopril, Lipitor, atenolol.  Diabetes Health Maintenance Due  Topic Date Due   FOOT EXAM  03/16/2023   OPHTHALMOLOGY EXAM  06/20/2023 (Originally 04/07/2023)   HEMOGLOBIN A1C  07/19/2023    Last A1c:  Lab Results  Component Value Date   HGBA1C 6.0 (H) 01/16/2023    ROS: No chest pain, visual disturbance or dizziness.  She does report some wheezing but notes that she was not able to afford Anoro so has stopped it.  She is only using her albuterol 2 puffs daily instead.  No shortness of breath reported.  She reports numbness and tingling in the feet that is mild  2.  Chronic low back pain with history of spinal surgery Patient reports stability of this with gabapentin.  She has muscle relaxer and NSAIDs on hand if needed for breakthrough severe pain.  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    Current Outpatient Medications:    acetaminophen (TYLENOL) 500 MG tablet, Take 1,000 mg by mouth at bedtime., Disp: , Rfl:    albuterol (VENTOLIN HFA) 108 (90 Base) MCG/ACT inhaler, INHALE 2 PUFFS BY MOUTH EVERY 6 HOURS AS NEEDED FOR WHEEZING AND FOR SHORTNESS OF BREATH (Patient taking differently: Inhale 2 puffs into the lungs at bedtime.), Disp: 9 g,  Rfl: 0   atenolol (TENORMIN) 50 MG tablet, Take 1 tablet (50 mg total) by mouth daily., Disp: 90 tablet, Rfl: 3   atorvastatin (LIPITOR) 20 MG tablet, Take 1 tablet (20 mg total) by mouth daily., Disp: 90 tablet, Rfl: 3   cyclobenzaprine (FLEXERIL) 5 MG tablet, Take 1 tablet (5 mg total) by mouth 3 (three) times daily as needed for muscle spasms., Disp: 30 tablet, Rfl: 1   diphenhydrAMINE (BENADRYL) 25 MG tablet, Take 25 mg by mouth at bedtime., Disp: , Rfl:    fluticasone (FLONASE) 50 MCG/ACT nasal spray, Place 2 sprays into both nostrils daily., Disp: 16 g, Rfl: 6   gabapentin (NEURONTIN) 300 MG capsule, Take 1 capsule (300 mg total) by mouth 2 (two) times daily., Disp: 180 capsule, Rfl: 3   lisinopril (ZESTRIL) 20 MG tablet, Take 1 tablet (20 mg total) by mouth daily., Disp: 90 tablet, Rfl: 3   Melatonin 10 MG TABS, Take 10 mg by mouth at bedtime., Disp: , Rfl:    Omega 3-6-9 Fatty Acids (TRIPLE OMEGA COMPLEX PO), Take 1 capsule by mouth daily., Disp: , Rfl:    umeclidinium-vilanterol (ANORO ELLIPTA) 62.5-25 MCG/INH AEPB, Inhale 1 puff into the lungs daily., Disp: 28 each, Rfl: 0 Social History   Socioeconomic History   Marital status: Divorced    Spouse name: Not on file   Number of children: 1   Years of  education: Not on file   Highest education level: Some college, no degree  Occupational History   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  Tobacco Use   Smoking status: Every Day    Current packs/day: 0.00    Average packs/day: 0.5 packs/day for 41.3 years (20.7 ttl pk-yrs)    Types: Cigarettes    Start date: 10/18/1977    Last attempt to quit: 02/10/2019    Years since quitting: 4.3   Smokeless tobacco: Never  Vaping Use   Vaping status: Never Used  Substance and Sexual Activity   Alcohol use: No   Drug use: No   Sexual activity: Not Currently  Other Topics Concern   Not on file  Social History Narrative   Not on file   Social Drivers of Health   Financial  Resource Strain: Low Risk  (03/14/2023)   Overall Financial Resource Strain (CARDIA)    Difficulty of Paying Living Expenses: Not hard at all  Food Insecurity: No Food Insecurity (03/14/2023)   Hunger Vital Sign    Worried About Running Out of Food in the Last Year: Never true    Ran Out of Food in the Last Year: Never true  Transportation Needs: No Transportation Needs (03/14/2023)   PRAPARE - Administrator, Civil Service (Medical): No    Lack of Transportation (Non-Medical): No  Physical Activity: Insufficiently Active (03/14/2023)   Exercise Vital Sign    Days of Exercise per Week: 2 days    Minutes of Exercise per Session: 60 min  Stress: No Stress Concern Present (03/14/2023)   [employer] of Occupational Health - Occupational Stress Questionnaire    Feeling of Stress : Not at all  Social Connections: Moderately Integrated (03/14/2023)   Social Connection and Isolation Panel [NHANES]    Frequency of Communication with Friends and Family: More than three times a week    Frequency of Social Gatherings with Friends and Family: More than three times a week    Attends Religious Services: 1 to 4 times per year    Active Member of [employer] or Organizations: Yes    Attends Engineer, structural: More than 4 times per year    Marital Status: Divorced  Intimate Partner Violence: Not At Risk (03/14/2023)   Humiliation, Afraid, Rape, and Kick questionnaire    Fear of Current or Ex-Partner: No    Emotionally Abused: No    Physically Abused: No    Sexually Abused: No   Family History  Problem Relation Age of Onset   Diabetes Mother    Hypertension Father    Colon cancer Neg Hx    Esophageal cancer Neg Hx    Rectal cancer Neg Hx    Stomach cancer Neg Hx    Breast cancer Neg Hx     Objective: Office vital signs reviewed. BP (!) 149/86   Pulse 60   Temp 98.5 F (36.9 C)   Ht 5\' 5"  (1.651 m)   SpO2 97%   BMI 24.79 kg/m   Physical Examination:  General:  Awake, alert, well nourished, No acute distress HEENT: sclera white, MMM Cardio: regular rate and rhythm, S1S2 heard, no murmurs appreciated Pulm: Global mild expiratory wheezes, no rhonchi or rales; normal work of breathing on room air Extremities: Onychomycotic changes to the toenails bilaterally  Diabetic Foot Exam - Simple   Simple Foot Form Visual Inspection No deformities, no ulcerations, no other skin breakdown bilaterally: Yes Sensation Testing Intact to touch and  monofilament testing bilaterally: Yes Pulse Check Posterior Tibialis and Dorsalis pulse intact bilaterally: Yes Comments      Assessment/ Plan: 62 y.o. female   Type 2 diabetes mellitus with other specified complication, without long-term current use of insulin (HCC) - Plan: Bayer DCA Hb A1c Waived, CMP14+EGFR, Microalbumin / creatinine urine ratio  Hypertension associated with diabetes (HCC) - Plan: CMP14+EGFR  Hyperlipidemia associated with type 2 diabetes mellitus (HCC) - Plan: CMP14+EGFR  Coronary artery disease involving native coronary artery of native heart without angina pectoris - Plan: CMP14+EGFR  Tobacco use - Plan: Ambulatory Referral Lung Cancer Screening [place] [facility], AMB Referral [facility] Care Management  Simple chronic bronchitis (HCC) - Plan: Budeson-Glycopyrrol-Formoterol (BREZTRI AEROSPHERE) 160-9-4.8 MCG/ACT AERO, AMB Referral [facility] Care Management  Back pain with history of spinal surgery  Urine microalbumin collected.  Sugar under good control with A1c of 6.0 today.  ROI for diabetic eye exam completed.  Check renal function, liver enzymes and electrolytes  Continue blood pressure and cholesterol regimen to prevent progression of CAD.  Blood pressure is not at goal today but was within normal range last visit.  May need to consider advancing lisinopril  Reinforced need for tobacco cessation.  Referral for annual lung cancer screening placed.  I have also placed referral to clinical  pharmacy for patient assistance program for [facility].  I gave her several samples today and first inhalation performed together.  Back pain chronic and stable with use of gabapentin and as needed muscle relaxer  Nancy Ip, DO [facility] [phone]

## 2023-06-21 ENCOUNTER — Telehealth: Payer: Self-pay

## 2023-06-21 LAB — CMP14+EGFR
ALT: 19 IU/L (ref 0–32)
AST: 27 IU/L (ref 0–40)
Albumin: 4.6 g/dL (ref 3.9–4.9)
Alkaline Phosphatase: 67 IU/L (ref 44–121)
BUN/Creatinine Ratio: 10 — ABNORMAL LOW (ref 12–28)
BUN: 9 mg/dL (ref 8–27)
Bilirubin Total: 1.1 mg/dL (ref 0.0–1.2)
CO2: 24 mmol/L (ref 20–29)
Calcium: 9.7 mg/dL (ref 8.7–10.3)
Chloride: 100 mmol/L (ref 96–106)
Creatinine, Ser: 0.9 mg/dL (ref 0.57–1.00)
Globulin, Total: 1.6 g/dL (ref 1.5–4.5)
Glucose: 106 mg/dL — ABNORMAL HIGH (ref 70–99)
Potassium: 4.9 mmol/L (ref 3.5–5.2)
Sodium: 138 mmol/L (ref 134–144)
Total Protein: 6.2 g/dL (ref 6.0–8.5)
eGFR: 72 mL/min/{1.73_m2} (ref >59)

## 2023-06-21 LAB — MICROALBUMIN / CREATININE URINE RATIO
Creatinine, Urine: 23.1 mg/dL
Microalb/Creat Ratio: <13 mg/g{creat} (ref 0–29)
Microalbumin, Urine: <3 ug/mL

## 2023-06-21 NOTE — Progress Notes (Signed)
Care Guide Pharmacy Note  06/21/2023 Name: Nancy Thornton MRN: [id number] DOB: [birth year]  Referred By: Raliegh Ip, DO Reason for referral: Care Coordination (Outreach to schedule with Pharm d )   Nancy Thornton is a 61 y.o. year old female who is a primary care patient of Raliegh Ip, DO.  Nancy Thornton was referred to the pharmacist for assistance related to:  chronic bronchitis   Successful contact was made with the patient to discuss pharmacy services including being ready for the pharmacist to call at least 5 minutes before the scheduled appointment time and to have medication bottles and any blood pressure readings ready for review. The patient agreed to meet with the pharmacist via face to face  on (date/time).07/27/2023  Penne Lash , RMA     [place]  [facility], [facility] Guide  Direct Dial: [phone]  Website: [email or web address]

## 2023-07-17 ENCOUNTER — Encounter: Payer: Self-pay | Admitting: Family

## 2023-07-17 ENCOUNTER — Ambulatory Visit (INDEPENDENT_AMBULATORY_CARE_PROVIDER_SITE_OTHER): Payer: Medicare Other | Admitting: Family

## 2023-07-17 VITALS — BP 159/82 | HR 60 | Temp 98.1°F | Wt 152.0 lb

## 2023-07-17 DIAGNOSIS — R590 Localized enlarged lymph nodes: Secondary | ICD-10-CM

## 2023-07-17 DIAGNOSIS — B354 Tinea corporis: Secondary | ICD-10-CM | POA: Diagnosis not present

## 2023-07-17 DIAGNOSIS — Z720 Tobacco use: Secondary | ICD-10-CM

## 2023-07-17 DIAGNOSIS — F419 Anxiety disorder, unspecified: Secondary | ICD-10-CM | POA: Diagnosis not present

## 2023-07-17 MED ORDER — KETOCONAZOLE 2 % EX CREA: 1.0000 | TOPICAL_CREAM | Freq: Every day | CUTANEOUS | 1 refills | Status: AC

## 2023-07-17 NOTE — Patient Instructions (Signed)
 Lymphadenopathy  Lymphadenopathy is when your lymph glands are swollen or larger than normal.  Lymph glands, also called lymph nodes, are clumps of tissue. They filter germs and waste from tissues in your body to your bloodstream. They're part of your body's defense system, or immune system. Lymphadenopathy has different causes, like infection, autoimmune disease, and cancer. Lymphadenopathy can happen wherever you have lymph nodes. The type you have depends on which nodes it's in, such as: Cervical lymphadenopathy. This is in the neck. Mediastinal lymphadenopathy. This is in the chest. Hilar lymphadenopathy. This is in the lungs. Axillary lymphadenopathy. This is in the armpits. Inguinal lymphadenopathy. This is in the groin. Sometimes, fluid and cells that fight infection build up in your lymph nodes. This happens when your immune system reacts to germs or other substances that get into your body. This makes lymph nodes swell and get bigger. Treatment is based on what's thought to be the cause. Sometimes, lymph nodes don't go back to normal size after treatment. If yours don't, your health care provider may order tests to help learn why your glands are still swollen and big. Follow these instructions at home:  Take over-the-counter and prescription medicines only as told by your provider. If you were prescribed antibiotics, do not stop using them, even if you start to feel better. If told, apply heat to swollen lymph nodes as told by your provider. Use the heat source that your provider recommends, such as a moist heat pack or a heating pad. Place a towel between your skin and the heat source. Leave the heat on only for the time told by your provider to avoid injury. If your skin turns bright red, remove the heat right away to prevent burns. The risk of burns is higher if you cannot feel pain, heat, or cold. Check your swollen lymph nodes every day for changes. Check other places where you have  lymph nodes as told. Check for changes such as: More swelling. Sudden growth in size. Redness or pain. Hardness. Contact a health care provider if: You have lymph nodes that: Are still swollen after 2 weeks. Have gotten bigger all of a sudden or the swelling spreads. Are red, painful, or hard. Fluid leaks from the skin near a swollen lymph node. You get a fever, chills, or night sweats. You feel tired. You have a sore throat. Your abdomen hurts. You lose weight without trying. This information is not intended to replace advice given to you by your health care provider. Make sure you discuss any questions you have with your health care provider. Document Revised: 09/14/2022 Document Reviewed: 09/14/2022 Elsevier Patient Education  2024 [employer].

## 2023-07-17 NOTE — Progress Notes (Signed)
 Subjective:    Patient ID: Nancy Thornton, female    DOB: Mar 27, 1961, 62 y.o.   MRN: [id number]  Chief Complaint  Patient presents with   Mass    MASS ON THROAT AND AND BACK    Pt presents to the office today with complaints of mass on left neck. She reports she noticed it over a year ago, however, last week she noticed it has grown. Reports it was the size of a pea, but now a marble. She is very anxious. She denies any pain or trouble swallowing. She is a smoker and smoking 1/2 pack a day.   Reports losing 75 lbs over the last 3 years after starting metformin .   Reports she was fostering kittens that had ringworm. She noticed a circular rash on her left upper back.  Rash This is a new problem. The current episode started 1 to 4 weeks ago. The problem is unchanged. The affected locations include the back. She was exposed to a new animal.      Review of Systems  Skin:  Positive for rash.  All other systems reviewed and are negative.      Objective:   Physical Exam Vitals reviewed.  Constitutional:      General: She is not in acute distress.    Appearance: She is well-developed.  HENT:     Head: Normocephalic and atraumatic.     Right Ear: External ear normal.     Left Ear: External ear normal.  Eyes:     Pupils: Pupils are equal, round, and reactive to light.  Neck:     Thyroid : No thyromegaly.      Comments: Slightly enlarge cervical lymphadenopathy  Cardiovascular:     Rate and Rhythm: Normal rate and regular rhythm.     Heart sounds: Normal heart sounds. No murmur heard. Pulmonary:     Effort: Pulmonary effort is normal. No respiratory distress.     Breath sounds: Normal breath sounds. No wheezing.  Abdominal:     General: Bowel sounds are normal. There is no distension.     Palpations: Abdomen is soft.     Tenderness: There is no abdominal tenderness.  Musculoskeletal:        General: No tenderness. Normal range of motion.     Cervical back: Normal range of  motion and neck supple.  Lymphadenopathy:     Cervical: Cervical adenopathy present.     Left cervical: Deep cervical adenopathy present.  Skin:    General: Skin is warm and dry.       Neurological:     Mental Status: She is alert and oriented to person, place, and time.     Cranial Nerves: No cranial nerve deficit.     Deep Tendon Reflexes: Reflexes are normal and symmetric.  Psychiatric:        Behavior: Behavior normal.        Thought Content: Thought content normal.        Judgment: Judgment normal.     BP (!) 159/82   Pulse 60   Temp 98.1 F (36.7 C) (Temporal)   Wt 152 lb (68.9 kg)   SpO2 97%   BMI 25.29 kg/m      Assessment & Plan:  Nancy Thornton comes in today with chief complaint of Mass (MASS ON THROAT AND AND BACK )   Diagnosis and orders addressed:  1. Cervical lymphadenopathy (Primary) US  pending  Smoking cessation discussed  - US  Soft Tissue Head/Neck (NON-THYROID ); Future  2. Tobacco use Smoking cessation discussed   3. Tinea corporis Start ketoconazole   Good hand hygiene  - ketoconazole  (NIZORAL ) 2 % cream; Apply 1 Application topically daily.  Dispense: 30 g; Refill: 1  4. Anxiety Stress management     Bari Learn, FNP

## 2023-07-24 ENCOUNTER — Ambulatory Visit (INDEPENDENT_AMBULATORY_CARE_PROVIDER_SITE_OTHER): Payer: Medicare Other | Admitting: Nurse Practitioner

## 2023-07-24 ENCOUNTER — Encounter: Payer: Self-pay | Admitting: Nurse Practitioner

## 2023-07-24 VITALS — BP 147/77 | HR 53 | Temp 97.6°F | Ht 65.0 in | Wt 151.6 lb

## 2023-07-24 DIAGNOSIS — L03319 Cellulitis of trunk, unspecified: Secondary | ICD-10-CM | POA: Diagnosis not present

## 2023-07-24 DIAGNOSIS — L02219 Cutaneous abscess of trunk, unspecified: Secondary | ICD-10-CM | POA: Insufficient documentation

## 2023-07-24 DIAGNOSIS — B354 Tinea corporis: Secondary | ICD-10-CM | POA: Diagnosis not present

## 2023-07-24 MED ORDER — DOXYCYCLINE HYCLATE 100 MG PO CAPS
100.0000 mg | ORAL_CAPSULE | Freq: Two times a day (BID) | ORAL | 0 refills | Status: DC
Start: 2023-07-24 — End: 2023-08-10

## 2023-07-24 NOTE — Progress Notes (Unsigned)
Established Patient Office Visit  Subjective  Patient ID: Nancy Thornton, female    DOB: 1960/12/09  Age: 63 y.o. MRN: [id number]  Chief Complaint  Patient presents with   Cyst    Has cyst on back was seen last week for it,now it is turning red and very sensitive     HPI Nancy Thornton is 63 yrs old female present 07/24/2023 for an acute visit cyst and rash on her back that has worsen since her last visit on 07/17/2023. Reports site is tender to touch The patient is a 63 year old female presenting with an acute cyst and rash on her back, which has worsened since her last visit on 07/17/2023. She reports that the site is tender to touch. The rash was previously treated with Nizoral cream, which improved the condition, but she still has some areas of rash remaining. Denies fever, SOB, chest pain.  Patient Active Problem List   Diagnosis Date Noted   Cellulitis and abscess of trunk 07/24/2023   Tinea corporis 07/24/2023   Coronary artery disease involving native coronary artery of native heart without angina pectoris 06/20/2023   Weight loss, unintentional 05/16/2022   Chronic diarrhea 01/09/2020   Fatty liver 01/09/2020   Back pain with history of spinal surgery 04/12/2019   S/P lumbar fusion 03/06/2019   Tobacco use 07/10/2018   Obesity 07/10/2018   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   History of arthroscopy of knee 09/26/2017   Complex tear of medial meniscus of left knee as current injury 08/29/2017   Acute lateral meniscus tear of left knee 08/15/2017   Simple chronic bronchitis (HCC) 10/28/2014   Hypertension associated with diabetes (HCC) 10/19/2012   Type 2 diabetes mellitus with hyperglycemia, without long-term current use of insulin (HCC) 10/19/2012   Hyperlipidemia associated with type 2 diabetes mellitus (HCC) 10/19/2012   Past Medical History:  Diagnosis Date   Anal fissure    history of   Asthma    Depression    Diabetes mellitus without complication (HCC)    DM  (diabetes mellitus) (HCC) 10/19/2012   Hypertension    Lichen    hands and feet   Obesity    Substance abuse (HCC)    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   Past Surgical History:  Procedure Laterality Date   ANTERIOR LUMBAR FUSION N/A 03/06/2019   Procedure: L4-5 XLIF, L4-5 PSF, LEFT SYNOVIAL CYST RESECTION L4-5;  Surgeon: Venetia Night, MD;  Location: [facility];  Service: Neurosurgery;  Laterality: N/A;   BACK SURGERY     BIOPSY  01/28/2020   Procedure: BIOPSY;  Surgeon: Dolores Frame, MD;  Location: [facility];  Service: Gastroenterology;;   CHOLECYSTECTOMY     COLONOSCOPY WITH PROPOFOL N/A 01/28/2020   Procedure: COLONOSCOPY WITH PROPOFOL;  Surgeon: Dolores Frame, MD;  Location: [facility];  Service: Gastroenterology;  Laterality: N/A;  915   CYSTOSTOMY W/ BLADDER BIOPSY  1990   punctured hole in bladder, had open procedure to repair   ESOPHAGOGASTRODUODENOSCOPY (EGD) WITH PROPOFOL N/A 06/08/2022   Procedure: ESOPHAGOGASTRODUODENOSCOPY (EGD) WITH PROPOFOL;  Surgeon: Dolores Frame, MD;  Location: [facility];  Service: Gastroenterology;  Laterality: N/A;  2:30PM   MENISCUS REPAIR     TONSILLECTOMY     TOTAL VAGINAL HYSTERECTOMY     63 y/o. bleeding. ovaries left in situ   Social History   Tobacco Use   Smoking status: Every Day    Current packs/day: 0.00    Average packs/day:  0.5 packs/day for 41.3 years (20.7 ttl pk-yrs)    Types: Cigarettes    Start date: 10/18/1977    Last attempt to quit: 02/10/2019    Years since quitting: 4.4   Smokeless tobacco: Never  Vaping Use   Vaping status: Never Used  Substance Use Topics   Alcohol use: No   Drug use: No   Social History   Socioeconomic History   Marital status: Divorced    Spouse name: Not on file   Number of children: 1   Years of education: Not on file   Highest education level: Some college, no degree  Occupational History   Occupation: Midwife: [employer]  Tobacco Use   Smoking status: Every Day    Current packs/day: 0.00    Average packs/day: 0.5 packs/day for 41.3 years (20.7 ttl pk-yrs)    Types: Cigarettes    Start date: 10/18/1977    Last attempt to quit: 02/10/2019    Years since quitting: 4.4   Smokeless tobacco: Never  Vaping Use   Vaping status: Never Used  Substance and Sexual Activity   Alcohol use: No   Drug use: No   Sexual activity: Not Currently  Other Topics Concern   Not on file  Social History Narrative   Not on file   Social Drivers of Health   Financial Resource Strain: Low Risk  (03/14/2023)   Overall Financial Resource Strain (CARDIA)    Difficulty of Paying Living Expenses: Not hard at all  Food Insecurity: No Food Insecurity (03/14/2023)   Hunger Vital Sign    Worried About Running Out of Food in the Last Year: Never true    Ran Out of Food in the Last Year: Never true  Transportation Needs: No Transportation Needs (03/14/2023)   PRAPARE - Administrator, Civil Service (Medical): No    Lack of Transportation (Non-Medical): No  Physical Activity: Insufficiently Active (03/14/2023)   Exercise Vital Sign    Days of Exercise per Week: 2 days    Minutes of Exercise per Session: 60 min  Stress: No Stress Concern Present (03/14/2023)   [employer] of Occupational Health - Occupational Stress Questionnaire    Feeling of Stress : Not at all  Social Connections: Moderately Integrated (03/14/2023)   Social Connection and Isolation Panel [NHANES]    Frequency of Communication with Friends and Family: More than three times a week    Frequency of Social Gatherings with Friends and Family: More than three times a week    Attends Religious Services: 1 to 4 times per year    Active Member of [employer] or Organizations: Yes    Attends Engineer, structural: More than 4 times per year    Marital Status: Divorced  Intimate Partner Violence: Not At Risk (03/14/2023)   Humiliation, Afraid, Rape,  and Kick questionnaire    Fear of Current or Ex-Partner: No    Emotionally Abused: No    Physically Abused: No    Sexually Abused: No   Family Status  Relation Name Status   Mother  Deceased   Father  Deceased   Neg Hx  (Not Specified)  No partnership data on file   Family History  Problem Relation Age of Onset   Diabetes Mother    Hypertension Father    Colon cancer Neg Hx    Esophageal cancer Neg Hx    Rectal cancer Neg Hx    Stomach cancer Neg Hx  Breast cancer Neg Hx    Allergies  Allergen Reactions   Penicillins Hives   Meloxicam Hypertension   Bactrim [Sulfamethoxazole-Trimethoprim] Rash      Review of Systems  Constitutional:  Negative for chills and fever.  HENT:  Negative for congestion and tinnitus.   Respiratory:  Negative for cough and shortness of breath.   Cardiovascular:  Negative for chest pain and leg swelling.  Gastrointestinal:  Negative for constipation, diarrhea, nausea and vomiting.  Musculoskeletal:  Positive for neck pain.       D/t abcess  Skin:        Abscess on left upper back  Neurological:  Negative for dizziness and headaches.   Negative unless indicated in HPI   Objective:     BP (!) 147/77   Pulse (!) 53   Temp 97.6 F (36.4 C) (Temporal)   Ht 5\' 5"  (1.651 m)   Wt 151 lb 9.6 oz (68.8 kg)   SpO2 100%   BMI 25.23 kg/m  BP Readings from Last 3 Encounters:  07/24/23 (!) 147/77  07/17/23 (!) 159/82  06/20/23 (!) 149/86   Wt Readings from Last 3 Encounters:  07/24/23 151 lb 9.6 oz (68.8 kg)  07/17/23 152 lb (68.9 kg)  06/20/23 149 lb (67.6 kg)      Physical Exam Vitals and nursing note reviewed.  Constitutional:      Appearance: Normal appearance.  HENT:     Head: Normocephalic and atraumatic.     Nose: Nose normal.     Mouth/Throat:     Mouth: Mucous membranes are moist.  Eyes:     General: No scleral icterus.    Extraocular Movements: Extraocular movements intact.     Conjunctiva/sclera: Conjunctivae  normal.     Pupils: Pupils are equal, round, and reactive to light.  Cardiovascular:     Rate and Rhythm: Normal rate and regular rhythm.  Pulmonary:     Effort: Pulmonary effort is normal.     Breath sounds: Normal breath sounds.  Musculoskeletal:     Right lower leg: No edema.     Left lower leg: No edema.  Skin:    General: Skin is warm and dry.     Findings: Abscess present.  Neurological:     Mental Status: She is alert.  Psychiatric:        Mood and Affect: Mood normal.        Behavior: Behavior normal.        Thought Content: Thought content normal.        Judgment: Judgment normal.      No results found for any visits on 07/24/23.  Last CBC Lab Results  Component Value Date   WBC 8.7 04/21/2022   HGB 15.1 04/21/2022   HCT 43.4 04/21/2022   MCV 91 04/21/2022   MCH 31.7 04/21/2022   RDW 12.3 04/21/2022   PLT 248 04/21/2022   Last metabolic panel Lab Results  Component Value Date   GLUCOSE 106 (H) 06/20/2023   NA 138 06/20/2023   K 4.9 06/20/2023   CL 100 06/20/2023   CO2 24 06/20/2023   BUN 9 06/20/2023   CREATININE 0.90 06/20/2023   EGFR 72 06/20/2023   CALCIUM 9.7 06/20/2023   PROT 6.2 06/20/2023   ALBUMIN 4.6 06/20/2023   LABGLOB 1.6 06/20/2023   AGRATIO 3.5 (H) 07/06/2022   BILITOT 1.1 06/20/2023   ALKPHOS 67 06/20/2023   AST 27 06/20/2023   ALT 19 06/20/2023   ANIONGAP 13 10/20/2019  Last lipids Lab Results  Component Value Date   CHOL 160 03/15/2022   HDL 60 03/09/2023   LDLCALC 78 03/15/2022   LDLDIRECT 54 02/16/2018   TRIG 96 03/09/2023   CHOLHDL 2.6 03/15/2022   Last hemoglobin A1c Lab Results  Component Value Date   HGBA1C 6.0 (H) 06/20/2023   Last thyroid functions Lab Results  Component Value Date   TSH 1.380 07/06/2022        Assessment & Plan:  Cellulitis and abscess of trunk -     Doxycycline Hyclate; Take 1 capsule (100 mg total) by mouth 2 (two) times daily.  Dispense: 20 capsule; Refill: 0  Tinea  corporis   Nancy Thornton is 63 yrs old caucasian female seen today for abscess on her back, no acute distress Abscess will treat with doxy 100 mg BID for 10 days # 20 dispense, she is instructed to take all of them until done Tinea corporis: Continue Nizoral Cream that was prescribed at her last visit, no refill needed  The above assessment and management plan was discussed with the patient. The patient verbalized understanding of and has agreed to the management plan. Patient is aware to call the clinic if they develop any new symptoms or if symptoms persist or worsen. Patient is aware when to return to the clinic for a follow-up visit. Patient educated on when it is appropriate to go to the emergency department.  Return if symptoms worsen or fail to improve.    Nancy Thornton, [facility] [address] [place], [place] [address] [phone]    Note: This document was prepared by Reubin Milan voice dictation technology and any errors that results from this process are unintentional.

## 2023-07-25 ENCOUNTER — Ambulatory Visit (HOSPITAL_COMMUNITY)
Admission: RE | Admit: 2023-07-25 | Discharge: 2023-07-25 | Disposition: A | Discharge status: Home / Self Care | Payer: Medicare Other | Source: Ambulatory Visit | Attending: Family | Admitting: Family

## 2023-07-25 DIAGNOSIS — K118 Other diseases of salivary glands: Secondary | ICD-10-CM | POA: Diagnosis not present

## 2023-07-25 DIAGNOSIS — R590 Localized enlarged lymph nodes: Secondary | ICD-10-CM | POA: Diagnosis not present

## 2023-07-27 ENCOUNTER — Ambulatory Visit: Payer: Medicare Other | Admitting: Pharmacist

## 2023-07-27 ENCOUNTER — Telehealth: Payer: Self-pay | Admitting: Pharmacist

## 2023-07-27 DIAGNOSIS — J209 Acute bronchitis, unspecified: Secondary | ICD-10-CM

## 2023-07-27 DIAGNOSIS — Z713 Dietary counseling and surveillance: Secondary | ICD-10-CM | POA: Diagnosis not present

## 2023-07-27 DIAGNOSIS — J440 Chronic obstructive pulmonary disease with acute lower respiratory infection: Secondary | ICD-10-CM | POA: Diagnosis not present

## 2023-07-27 DIAGNOSIS — J410 Simple chronic bronchitis: Secondary | ICD-10-CM

## 2023-07-27 DIAGNOSIS — F17210 Nicotine dependence, cigarettes, uncomplicated: Secondary | ICD-10-CM | POA: Diagnosis not present

## 2023-07-27 MED ORDER — BREZTRI AEROSPHERE 160-9-4.8 MCG/ACT IN AERO: 2.0000 | INHALATION_SPRAY | Freq: Two times a day (BID) | RESPIRATORY_TRACT | 5 refills | Status: AC

## 2023-07-27 NOTE — Telephone Encounter (Signed)
   Patient needs to enroll [place]in the [place] patient assistance program for [facility].  Updated RX escribed to [facility] mail order (pharmacy for [place]&me patient assistance).  Patient is stable on current regimen.  Okay to route me PAP.  Kieth Brightly, PharmD, BCACP, CPP Clinical Pharmacist, [facility]

## 2023-07-27 NOTE — Progress Notes (Signed)
07/27/2023 Name: Nancy Thornton MRN: [id number] DOB: October 19, 1960  Chief Complaint  Patient presents with   COPD    Nancy Thornton is a 63 y.o. year old female who was referred for medication management by their primary care provider, Delynn Flavin M, DO. They presented for a face to face visit today.   They were referred to the pharmacist by their PCP for assistance in managing medication access    Subjective:  Care Team: Primary Care Provider: Raliegh Ip, DO   Medication Access/Adherence  Current Pharmacy:  [facility] [address], [place] - [address] [place] [address] Phone: [phone] Fax: [phone]  [facility] - [place], [place] - [address] [place] [address] Phone: [phone] Fax: [phone]   Patient reports affordability concerns with their medications: Yes  Patient reports access/transportation concerns to their pharmacy: No  Patient reports adherence concerns with their medications:  Yes  due to inhaler cost   COPD:  Current medications: Breztri samples. Albuterol rescue Medications tried in the past: anoro  Reports 0 exacerbations in the past year  Current medication access support: was previously using the health department (no longer accepting medicare); will enroll in [place] PAP for Breztri   Objective:  Lab Results  Component Value Date   HGBA1C 6.0 (H) 06/20/2023    Lab Results  Component Value Date   CREATININE 0.90 06/20/2023   BUN 9 06/20/2023   NA 138 06/20/2023   K 4.9 06/20/2023   CL 100 06/20/2023   CO2 24 06/20/2023    Lab Results  Component Value Date   CHOL 160 03/15/2022   HDL 60 03/09/2023   LDLCALC 78 03/15/2022   LDLDIRECT 54 02/16/2018   TRIG 96 03/09/2023   CHOLHDL 2.6 03/15/2022    Medications Reviewed Today     Reviewed by Danella Maiers, [facility] (Pharmacist) on 07/27/23 at 1010  Med List Status: <None>   Medication Order Taking? Sig  Documenting Provider Last Dose Status Informant  acetaminophen (TYLENOL) 500 MG tablet [id number] No Take 1,000 mg by mouth at bedtime. [provider] Taking Active Self  albuterol (VENTOLIN HFA) 108 (90 Base) MCG/ACT inhaler [id number] No INHALE 2 PUFFS BY MOUTH EVERY 6 HOURS AS NEEDED FOR WHEEZING AND FOR SHORTNESS OF BREATH  Patient taking differently: Inhale 2 puffs into the lungs at bedtime.   Raliegh Ip, DO Taking Active Self           Med Note Joella Prince A   Fri Jun 03, 2022 11:05 AM) Pt states she takes 2 puffs every night, doesn't use prn  atenolol (TENORMIN) 50 MG tablet [id number] No Take 1 tablet (50 mg total) by mouth daily. Delynn Flavin M, DO Taking Active   atorvastatin (LIPITOR) 20 MG tablet [id number] No Take 1 tablet (20 mg total) by mouth daily. Delynn Flavin M, DO Taking Active   Budeson-Glycopyrrol-Formoterol (BREZTRI AEROSPHERE) 160-9-4.8 MCG/ACT Sandrea Matte [id number]  Inhale 2 puffs into the lungs 2 (two) times daily. Delynn Flavin M, DO  Active   cyclobenzaprine (FLEXERIL) 5 MG tablet [id number] No Take 1 tablet (5 mg total) by mouth 3 (three) times daily as needed for muscle spasms. Daphine Deutscher Mary-Margaret, FNP Taking Active   diphenhydrAMINE (BENADRYL) 25 MG tablet [id number] No Take 25 mg by mouth at bedtime. [provider] Taking Active Self  doxycycline (VIBRAMYCIN) 100 MG capsule [id number]  Take 1 capsule (100 mg total) by mouth  2 (two) times daily. [place] Lighter, Dois Davenport, NP  Active   fluticasone [facility]) 50 MCG/ACT nasal spray [id number] No Place 2 sprays into both nostrils daily. Delynn Flavin M, DO Taking Active   gabapentin (NEURONTIN) 300 MG capsule [id number] No Take 1 capsule (300 mg total) by mouth 2 (two) times daily. Delynn Flavin M, DO Taking Active   ketoconazole (NIZORAL) 2 % cream [id number] No Apply 1 Application topically daily. Junie Spencer, FNP Taking Active   lisinopril (ZESTRIL) 20 MG tablet [id number] No Take  1 tablet (20 mg total) by mouth daily. Delynn Flavin M, DO Taking Active   Melatonin 10 MG TABS [id number] No Take 10 mg by mouth at bedtime. [provider] Taking Active Self  Omega 3-6-9 Fatty Acids (TRIPLE OMEGA COMPLEX PO) [id number] No Take 1 capsule by mouth daily. [provider] Taking Active Self              Assessment/Plan:   COPD: - Currently controlled.  - Reviewed appropriate inhaler technique. - not interested in smoking cessation at this time, but available if needed in the future - Recommend to continue [facility] Sample given Has rescue at home as needed  - Meets financial criteria for [place] patient assistance program through az&me. Will collaborate with provider, CPhT, and patient to pursue assistance.    Follow Up Plan: as needed  Kieth Brightly, PharmD, BCACP, CPP Clinical Pharmacist, [facility]

## 2023-07-28 ENCOUNTER — Telehealth: Payer: Self-pay

## 2023-08-04 NOTE — Progress Notes (Signed)
 Pharmacy Medication Assistance Program Note    09/11/2023  Patient ID: Nancy Thornton, female   DOB: [birth year], 63 y.o.   MRN: [id number]     07/28/2023  Outreach Medication One  Manufacturer Medication One Nurse, adult Drugs Bretztri  Type of Radiographer, therapeutic Assistance  Date Application Sent to Prescriber 07/28/2023  Date Application Received From Provider 08/03/2023  Date Application Submitted to Manufacturer 08/04/2023  Method Application Sent to Manufacturer Fax  Patient Assistance Determination Approved  Approval End Date 07/03/2024    New enrollment

## 2023-08-09 ENCOUNTER — Ambulatory Visit: Payer: Self-pay | Admitting: Family Medicine

## 2023-08-09 NOTE — Telephone Encounter (Signed)
 Copied from CRM [id number]. Topic: Clinical - Red Word Triage >> Aug 09, 2023  8:04 AM Carlatta H wrote: [facility] that prompted transfer to Nurse Triage: Patient has been seen for Cyst on left side of back// Patient was seen in the office and had a nurse come to her home yesterday it getting worse// Red and painful   Chief Complaint: cyst follow up after antibiotics Symptoms: redness and painful to touch Frequency: ongoing 1 month Pertinent Negatives: Patient denies fever  Disposition: [] ED /[] Urgent Care (no appt availability in office) / [x] Appointment(In office/virtual)/ []  [place] [facility]/ [] Home Care/ [] Refused Recommended Disposition /[] [facility]/ []  Follow-up with PCP Additional Notes: The patient reported ongoing pain 6-7/10 and worsening redness around a cyst on her left back.  She has been seen twice concerning the cyst that has been ongoing for 1 month.  She recently finished a course of doxycycline  but the symptoms did not improve.  A home health nurse came and assessed it and advised the patient to be seen again for further treatment.  The cyst is 3x63mm and has a red ring around it.  She was scheduled for a next day appointment for further evaluation.  Reason for Disposition  [1] Finished antibiotic treatment AND [2] symptoms WORSE (e.g., pain, spreading redness, swelling)  Answer Assessment - Initial Assessment Questions 1. APPEARANCE: What does the boil (abscess) look like?     Red ring around it  2. LOCATION: Where is the boil located?      Left back  3. NUMBER: How many boils are there?      1 4. SIZE: How big is the boil? (e.g., inches, cm; compare to size of a coin or other object)     3mm by 5mm 5. ONSET: When did the boil start?     1 month 6. PAIN: Is there any pain? If Yes, ask: How bad is the pain?  (Scale 1-10; or mild, moderate, severe)     Tender to touch  6-7/10 7. FEVER: Do you have a fever? If Yes, ask: What is it, how was  it measured, and when did it start?      No fever  8. TREATMENT: What treatment did you get or are you getting for the boil? (e.g., I&D, antibiotics, moist heat)     Antibiotic  9. OTHER SYMPTOMS: Do you have any other symptoms? (e.g., rash elsewhere on body, shaking chills, spreading redness of nearby skin or red streaks, weakness)      None  Protocols used: Boil (Skin Abscess) on Treatment Follow-up Call-[facility]

## 2023-08-10 ENCOUNTER — Ambulatory Visit: Payer: Medicare Other | Admitting: Family Medicine

## 2023-08-10 ENCOUNTER — Encounter: Payer: Self-pay | Admitting: Family Medicine

## 2023-08-10 VITALS — BP 145/84 | HR 63 | Ht 65.0 in | Wt 149.0 lb

## 2023-08-10 DIAGNOSIS — L02219 Cutaneous abscess of trunk, unspecified: Secondary | ICD-10-CM

## 2023-08-10 DIAGNOSIS — L723 Sebaceous cyst: Secondary | ICD-10-CM

## 2023-08-10 DIAGNOSIS — L03319 Cellulitis of trunk, unspecified: Secondary | ICD-10-CM

## 2023-08-10 DIAGNOSIS — L089 Local infection of the skin and subcutaneous tissue, unspecified: Secondary | ICD-10-CM

## 2023-08-10 MED ORDER — DOXYCYCLINE HYCLATE 100 MG PO TABS
100.0000 mg | ORAL_TABLET | Freq: Two times a day (BID) | ORAL | 0 refills | Status: DC
Start: 2023-08-10 — End: 2024-04-22

## 2023-08-10 NOTE — Progress Notes (Signed)
 BP (!) 145/84   Pulse 63   Ht 5' 5 (1.651 m)   Wt 149 lb (67.6 kg)   SpO2 96%   BMI 24.79 kg/m    Subjective:   Patient ID: Nancy Thornton, female    DOB: 05-20-1961, 63 y.o.   MRN: [id number]  HPI: Nancy Thornton is a 63 y.o. female presenting on 08/10/2023 for Abscess (Upper back. Red and painful.)   HPI Abscess Patient is coming in for abscess and cellulitis.  She was here just over 10 days ago and given an antibiotic which helped shrink it a little bit but she still having a lot of pain and swelling and still has a abscess there.  She says it is not drained.  She says the it still very painful and red and is coming again because it is not improving and it hurts when she leans back on things.  She has taken some ibuprofen but it is in a place where she cannot reach and squeeze it.  Relevant past medical, surgical, family and social history reviewed and updated as indicated. Interim medical history since our last visit reviewed. Allergies and medications reviewed and updated.  Review of Systems  Constitutional:  Negative for chills and fever.  Eyes:  Negative for visual disturbance.  Respiratory:  Negative for chest tightness and shortness of breath.   Cardiovascular:  Negative for chest pain and leg swelling.  Musculoskeletal:  Negative for back pain and gait problem.  Skin:  Positive for color change and rash.  Neurological:  Negative for light-headedness and headaches.  Psychiatric/Behavioral:  Negative for agitation and behavioral problems.   All other systems reviewed and are negative.   Per HPI unless specifically indicated above   Allergies as of 08/10/2023       Reactions   Penicillins Hives   Meloxicam Hypertension   Bactrim [sulfamethoxazole-trimethoprim] Rash        Medication List        Accurate as of August 10, 2023  9:55 AM. If you have any questions, ask your nurse or doctor.          STOP taking these medications    doxycycline  100 MG  capsule Commonly known as: VIBRAMYCIN  Replaced by: doxycycline  100 MG tablet Stopped by: Fonda LABOR Cejay Thornton   gabapentin  300 MG capsule Commonly known as: NEURONTIN  Stopped by: [facility] LABOR Ranny Wiebelhaus       TAKE these medications    acetaminophen  500 MG tablet Commonly known as: TYLENOL  Take 1,000 mg by mouth at bedtime.   albuterol  108 (90 Base) MCG/ACT inhaler Commonly known as: VENTOLIN  HFA INHALE 2 PUFFS BY MOUTH EVERY 6 HOURS AS NEEDED FOR WHEEZING AND FOR SHORTNESS OF BREATH What changed: See the new instructions.   atenolol  50 MG tablet Commonly known as: TENORMIN  Take 1 tablet (50 mg total) by mouth daily.   atorvastatin  20 MG tablet Commonly known as: LIPITOR Take 1 tablet (20 mg total) by mouth daily.   Breztri  Aerosphere 160-9-4.8 MCG/ACT Aero Generic drug: Budeson-Glycopyrrol-Formoterol  Inhale 2 puffs into the lungs 2 (two) times daily.   cyclobenzaprine  5 MG tablet Commonly known as: FLEXERIL  Take 1 tablet (5 mg total) by mouth 3 (three) times daily as needed for muscle spasms.   diphenhydrAMINE  25 MG tablet Commonly known as: BENADRYL  Take 25 mg by mouth at bedtime.   doxycycline  100 MG tablet Commonly known as: VIBRA -TABS Take 1 tablet (100 mg total) by mouth 2 (two) times daily. 1 po bid Replaces: doxycycline   100 MG capsule Started by: Fonda LABOR Ludell Zacarias   fluticasone  50 MCG/ACT nasal spray Commonly known as: FLONASE  Place 2 sprays into both nostrils daily.   ketoconazole  2 % cream Commonly known as: NIZORAL  Apply 1 Application topically daily.   lisinopril  20 MG tablet Commonly known as: ZESTRIL  Take 1 tablet (20 mg total) by mouth daily.   Melatonin 10 MG Tabs Take 10 mg by mouth at bedtime.   TRIPLE OMEGA COMPLEX PO Take 1 capsule by mouth daily.         Objective:   BP (!) 145/84   Pulse 63   Ht 5' 5 (1.651 m)   Wt 149 lb (67.6 kg)   SpO2 96%   BMI 24.79 kg/m   Wt Readings from Last 3 Encounters:  08/10/23 149 lb (67.6  kg)  07/24/23 151 lb 9.6 oz (68.8 kg)  07/17/23 152 lb (68.9 kg)    Physical Exam Vitals and nursing note reviewed.  Constitutional:      Appearance: Normal appearance.  Skin:    Findings: Abscess (1 cm area of inflammation and erythema and tenderness and fluctuation) and erythema present.  Neurological:     Mental Status: She is alert.     I&D: Region was anesthetized with topical ethyl chloride. Incision was made on anterior medial aspect of the open wound. Significant serosanguineous and purulent drainage was exuded. Culture was taken. Forceps was used to probe the area and break apart any loculations.  Pressure dressing was placed over top. Bleeding was minimal and patient tolerated procedure well.   Assessment & Plan:   Problem List Items Addressed This Visit       Other   Cellulitis and abscess of trunk - Primary   Relevant Medications   doxycycline  (VIBRA -TABS) 100 MG tablet   Other Visit Diagnoses       Infected sebaceous cyst           Performed incision and drainage and refill doxycycline , instructed patient that if it is not improving or worsening then to let us  know. Follow up plan: Return if symptoms worsen or fail to improve.  Counseling provided for all of the vaccine components No orders of the defined types were placed in this encounter.   Fonda Levins, MD [facility] 08/10/2023, 9:55 AM

## 2023-09-11 ENCOUNTER — Other Ambulatory Visit: Payer: Self-pay | Admitting: Family Medicine

## 2023-09-11 DIAGNOSIS — J410 Simple chronic bronchitis: Secondary | ICD-10-CM

## 2023-09-11 NOTE — Telephone Encounter (Signed)
 Copied from CRM [id number]. Topic: Clinical - Medication Refill >> Sep 11, 2023 10:59 AM Nyra Capes wrote: Most Recent Primary Care Visit:   Medication: Budeson-Glycopyrrol-Formoterol (BREZTRI AEROSPHERE) 160-9-4.8 MCG/ACT AERO  Has the patient contacted their pharmacy? No  Patient had appt with pharmacist  in January for inhaler, who helped filled out paperwork for getting medication directly from pharmaceutical company. Patient has not received the medication yet.  Patient has been using emergency inhaler in place of medication  Is this the correct pharmacy for this prescription? No  Has the prescription been filled recently? No  patient has been getting samples from provider office  Is the patient out of the medication? Yes  Has the patient been seen for an appointment in the last year OR does the patient have an upcoming appointment? Yes  Can we respond through MyChart? Yes  Agent: Please be advised that Rx refills may take up to 3 business days. We ask that you follow-up with your pharmacy.  Spoke at [place] who will send a note to Valier a Associate Professor who deals with Higher education careers adviser.

## 2023-09-11 NOTE — Telephone Encounter (Signed)
 Patient calling to get update on the status of this. Nancy Thornton, can you give an update? I can call patient and let her know whatever info you have.   Thank you!

## 2023-09-12 ENCOUNTER — Telehealth: Payer: Self-pay | Admitting: Family Medicine

## 2023-09-12 NOTE — Telephone Encounter (Signed)
 Spoke with patient and she said she spoke with Rep at [facility] and was told that the reason they haven't sent her Rx to her is because she is supposed to call them every 30 days and request a refill.   Is this correct information?

## 2023-09-12 NOTE — Telephone Encounter (Signed)
 Gave patient this info and she is going to call the number to check status of her order and will let us know what they say.

## 2023-09-12 NOTE — Telephone Encounter (Unsigned)
 Copied from CRM [id number]. Topic: General - Other >> Sep 12, 2023 11:36 AM Priscille Loveless wrote: Reason for CRM: Nancy Thornton was calling back for [name]. She wanted to tell you what the representative said. She said it would be to long to write, please give her a call.

## 2023-09-12 NOTE — Telephone Encounter (Signed)
 Yes! Sorry, notes are in chart labeled under Medication Assistance (breztri).

## 2023-10-03 DIAGNOSIS — M9902 Segmental and somatic dysfunction of thoracic region: Secondary | ICD-10-CM | POA: Diagnosis not present

## 2023-10-03 DIAGNOSIS — M62830 Muscle spasm of back: Secondary | ICD-10-CM | POA: Diagnosis not present

## 2023-10-03 DIAGNOSIS — M9903 Segmental and somatic dysfunction of lumbar region: Secondary | ICD-10-CM | POA: Diagnosis not present

## 2023-10-03 DIAGNOSIS — M9901 Segmental and somatic dysfunction of cervical region: Secondary | ICD-10-CM | POA: Diagnosis not present

## 2023-10-04 DIAGNOSIS — M9901 Segmental and somatic dysfunction of cervical region: Secondary | ICD-10-CM | POA: Diagnosis not present

## 2023-10-04 DIAGNOSIS — M9902 Segmental and somatic dysfunction of thoracic region: Secondary | ICD-10-CM | POA: Diagnosis not present

## 2023-10-04 DIAGNOSIS — M9903 Segmental and somatic dysfunction of lumbar region: Secondary | ICD-10-CM | POA: Diagnosis not present

## 2023-10-04 DIAGNOSIS — M62830 Muscle spasm of back: Secondary | ICD-10-CM | POA: Diagnosis not present

## 2023-10-05 DIAGNOSIS — M9903 Segmental and somatic dysfunction of lumbar region: Secondary | ICD-10-CM | POA: Diagnosis not present

## 2023-10-05 DIAGNOSIS — M9901 Segmental and somatic dysfunction of cervical region: Secondary | ICD-10-CM | POA: Diagnosis not present

## 2023-10-05 DIAGNOSIS — M62830 Muscle spasm of back: Secondary | ICD-10-CM | POA: Diagnosis not present

## 2023-10-05 DIAGNOSIS — M9902 Segmental and somatic dysfunction of thoracic region: Secondary | ICD-10-CM | POA: Diagnosis not present

## 2023-10-09 DIAGNOSIS — M9901 Segmental and somatic dysfunction of cervical region: Secondary | ICD-10-CM | POA: Diagnosis not present

## 2023-10-09 DIAGNOSIS — M9903 Segmental and somatic dysfunction of lumbar region: Secondary | ICD-10-CM | POA: Diagnosis not present

## 2023-10-09 DIAGNOSIS — M62830 Muscle spasm of back: Secondary | ICD-10-CM | POA: Diagnosis not present

## 2023-10-09 DIAGNOSIS — M9902 Segmental and somatic dysfunction of thoracic region: Secondary | ICD-10-CM | POA: Diagnosis not present

## 2023-10-10 DIAGNOSIS — M9902 Segmental and somatic dysfunction of thoracic region: Secondary | ICD-10-CM | POA: Diagnosis not present

## 2023-10-10 DIAGNOSIS — M9901 Segmental and somatic dysfunction of cervical region: Secondary | ICD-10-CM | POA: Diagnosis not present

## 2023-10-10 DIAGNOSIS — M62830 Muscle spasm of back: Secondary | ICD-10-CM | POA: Diagnosis not present

## 2023-10-10 DIAGNOSIS — M9903 Segmental and somatic dysfunction of lumbar region: Secondary | ICD-10-CM | POA: Diagnosis not present

## 2023-10-12 DIAGNOSIS — M62830 Muscle spasm of back: Secondary | ICD-10-CM | POA: Diagnosis not present

## 2023-10-12 DIAGNOSIS — M9903 Segmental and somatic dysfunction of lumbar region: Secondary | ICD-10-CM | POA: Diagnosis not present

## 2023-10-12 DIAGNOSIS — M9901 Segmental and somatic dysfunction of cervical region: Secondary | ICD-10-CM | POA: Diagnosis not present

## 2023-10-12 DIAGNOSIS — M9902 Segmental and somatic dysfunction of thoracic region: Secondary | ICD-10-CM | POA: Diagnosis not present

## 2023-10-16 DIAGNOSIS — M62830 Muscle spasm of back: Secondary | ICD-10-CM | POA: Diagnosis not present

## 2023-10-16 DIAGNOSIS — M9901 Segmental and somatic dysfunction of cervical region: Secondary | ICD-10-CM | POA: Diagnosis not present

## 2023-10-16 DIAGNOSIS — M9903 Segmental and somatic dysfunction of lumbar region: Secondary | ICD-10-CM | POA: Diagnosis not present

## 2023-10-16 DIAGNOSIS — M9902 Segmental and somatic dysfunction of thoracic region: Secondary | ICD-10-CM | POA: Diagnosis not present

## 2023-10-19 DIAGNOSIS — M62830 Muscle spasm of back: Secondary | ICD-10-CM | POA: Diagnosis not present

## 2023-10-19 DIAGNOSIS — M9902 Segmental and somatic dysfunction of thoracic region: Secondary | ICD-10-CM | POA: Diagnosis not present

## 2023-10-19 DIAGNOSIS — M9903 Segmental and somatic dysfunction of lumbar region: Secondary | ICD-10-CM | POA: Diagnosis not present

## 2023-10-19 DIAGNOSIS — M9901 Segmental and somatic dysfunction of cervical region: Secondary | ICD-10-CM | POA: Diagnosis not present

## 2023-10-23 DIAGNOSIS — M9903 Segmental and somatic dysfunction of lumbar region: Secondary | ICD-10-CM | POA: Diagnosis not present

## 2023-10-23 DIAGNOSIS — M9902 Segmental and somatic dysfunction of thoracic region: Secondary | ICD-10-CM | POA: Diagnosis not present

## 2023-10-23 DIAGNOSIS — M62830 Muscle spasm of back: Secondary | ICD-10-CM | POA: Diagnosis not present

## 2023-10-23 DIAGNOSIS — M9901 Segmental and somatic dysfunction of cervical region: Secondary | ICD-10-CM | POA: Diagnosis not present

## 2023-10-24 ENCOUNTER — Encounter: Payer: Self-pay | Admitting: Family Medicine

## 2023-10-24 ENCOUNTER — Ambulatory Visit: Payer: Medicare Other | Admitting: Family Medicine

## 2023-10-24 VITALS — BP 141/81 | HR 60 | Temp 98.7°F | Ht 65.0 in | Wt 144.0 lb

## 2023-10-24 DIAGNOSIS — Z720 Tobacco use: Secondary | ICD-10-CM

## 2023-10-24 DIAGNOSIS — Z114 Encounter for screening for human immunodeficiency virus [HIV]: Secondary | ICD-10-CM

## 2023-10-24 DIAGNOSIS — R3914 Feeling of incomplete bladder emptying: Secondary | ICD-10-CM | POA: Diagnosis not present

## 2023-10-24 DIAGNOSIS — E1169 Type 2 diabetes mellitus with other specified complication: Secondary | ICD-10-CM

## 2023-10-24 DIAGNOSIS — I2510 Atherosclerotic heart disease of native coronary artery without angina pectoris: Secondary | ICD-10-CM | POA: Diagnosis not present

## 2023-10-24 DIAGNOSIS — E785 Hyperlipidemia, unspecified: Secondary | ICD-10-CM | POA: Diagnosis not present

## 2023-10-24 DIAGNOSIS — Z0001 Encounter for general adult medical examination with abnormal findings: Secondary | ICD-10-CM

## 2023-10-24 DIAGNOSIS — E1159 Type 2 diabetes mellitus with other circulatory complications: Secondary | ICD-10-CM | POA: Diagnosis not present

## 2023-10-24 DIAGNOSIS — E1165 Type 2 diabetes mellitus with hyperglycemia: Secondary | ICD-10-CM

## 2023-10-24 DIAGNOSIS — Z90710 Acquired absence of both cervix and uterus: Secondary | ICD-10-CM | POA: Diagnosis not present

## 2023-10-24 DIAGNOSIS — I152 Hypertension secondary to endocrine disorders: Secondary | ICD-10-CM

## 2023-10-24 DIAGNOSIS — K5909 Other constipation: Secondary | ICD-10-CM

## 2023-10-24 DIAGNOSIS — Z0000 Encounter for general adult medical examination without abnormal findings: Secondary | ICD-10-CM

## 2023-10-24 LAB — BAYER DCA HB A1C WAIVED: HB A1C (BAYER DCA - WAIVED): 5.6 % (ref 4.8–5.6)

## 2023-10-24 NOTE — Patient Instructions (Signed)
About Cystocele  Overview  The pelvic organs, including the bladder, are normally supported by pelvic floor muscles and ligaments.  When these muscles and ligaments are stretched, weakened or torn, the wall between the bladder and the vagina sags or herniates causing a prolapse, sometimes called a cystocele.  This condition may cause discomfort and problems with emptying the bladder.  It can be present in various stages.  Some people are not aware of the changes.  Others may notice changes at the vaginal opening or a feeling of the bladder dropping outside the body.  Causes of a Cystocele  A cystocele is usually caused by muscle straining or stretching during childbirth.  In addition, cystocele is more common after menopause, because the hormone estrogen helps keep the elastic tissues around the pelvic organs strong.  A cystocele is more likely to occur when levels of estrogen decrease.  Other causes include: heavy lifting, chronic coughing, previous pelvic surgery and obesity.  Symptoms  A bladder that has dropped from its normal position may cause: unwanted urine leakage (stress incontinence), frequent urination or urge to urinate, incomplete emptying of the bladder (not feeling bladder relief after emptying), pain or discomfort in the vagina, pelvis, groin, lower back or lower abdomen and frequent urinary tract infections.  Mild cases may not cause any symptoms.  Treatment Options  Pelvic floor (Kegel) exercises:  Strength training the muscles in your genital area  Behavioral changes: Treating and preventing constipation, taking time to empty your bladder properly, learning to lift properly and/or avoid heavy lifting when possible, stopping smoking, avoiding weight gain and treating a chronic cough or bronchitis.  A pessary: A vaginal support device is sometimes used to help pelvic support caused by muscle and ligament changes.  Surgery: Surgical repair may be necessary if symptoms cannot  be managed with exercise, behavioral changes and a pessary.  Surgery is usually considered for severe cases.   2007, [employer] 

## 2023-10-24 NOTE — Progress Notes (Signed)
 Nancy Thornton is a 63 y.o. female presents to office today for annual physical exam examination.    Concerns today include: 1. Type 2 Diabetes with hypertension, hyperlipidemia and CAD:  She has had diet-controlled diabetes for quite some time now.  She continues to keep a low calorie diet.  She is compliant with her cholesterol medicine, atenolol , lisinopril .  She is not fasting this morning.  Had a muffin.  Drinks coffee once per day and hydrates with water the remainder of the day  Last eye exam: UTD Last foot exam: needs Last A1c:  Lab Results  Component Value Date   HGBA1C 6.0 (H) 06/20/2023   Nephropathy screen indicated?: UTD Last flu, zoster and/or pneumovax:  Immunization History  Administered Date(s) Administered   Influenza, High Dose Seasonal PF 03/30/2018   Influenza, Quadrivalent, Recombinant, Inj, Pf 03/20/2019, 03/14/2022   Influenza, Seasonal, Injecte, Preservative Fre 04/18/2013, 04/12/2014, 03/12/2015, 04/08/2017   Influenza,inj,Quad PF,6+ Mos 04/18/2013, 04/12/2014, 03/12/2015, 04/08/2017, 04/08/2017, 03/30/2018, 02/28/2023   Influenza-Unspecified 04/18/2013, 04/12/2014, 03/12/2015, 03/12/2015, 04/08/2017, 04/08/2017, 03/30/2018, 03/20/2019, 04/25/2020   Moderna Sars-Covid-2 Vaccination 09/20/2019, 10/22/2019, 02/24/2020, 04/25/2020   Pneumococcal Conjugate-13 09/05/2013   Pneumococcal Polysaccharide-23 03/12/2015   Respiratory Syncytial Virus Vaccine,Recomb Aduvanted(Arexvy) 03/14/2022   Td 01/09/2017   Tdap 01/09/2017   Zoster Recombinant(Shingrix ) 03/14/2022, 10/10/2022    ROS: Denies dizziness, LOC, polyuria, polydipsia, unintended weight loss/gain, foot ulcerations, numbness or tingling in extremities, shortness of breath or chest pain.  2.  Constipation, bladder issues She reports urinary frequency with only a little bit of urine that comes out.  She often has to lean forward to evacuate her bladder totally.  No hematuria, no dysuria.  Has history of  hysterectomy.  She also reports occasional constipation where she will get stool so backed up that she has to manually disimpact herself.  She drinks sufficient water and is active as above but may not be getting enough fiber in her diet.   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, Marital status: Single, Substance use: Tobacco Health Maintenance Due  Topic Date Due   Pneumococcal Vaccine 24-33 Years old (3 of 3 - PCV20 or PCV21) 03/11/2020   COVID-19 Vaccine (5 - 2024-25 season) 03/05/2023   FOOT EXAM  03/16/2023   Lung Cancer Screening  04/22/2023   Refills needed today: None  Immunization History  Administered Date(s) Administered   Influenza, High Dose Seasonal PF 03/30/2018   Influenza, Quadrivalent, Recombinant, Inj, Pf 03/20/2019, 03/14/2022   Influenza, Seasonal, Injecte, Preservative Fre 04/18/2013, 04/12/2014, 03/12/2015, 04/08/2017   Influenza,inj,Quad PF,6+ Mos 04/18/2013, 04/12/2014, 03/12/2015, 04/08/2017, 04/08/2017, 03/30/2018, 02/28/2023   Influenza-Unspecified 04/18/2013, 04/12/2014, 03/12/2015, 03/12/2015, 04/08/2017, 04/08/2017, 03/30/2018, 03/20/2019, 04/25/2020   Moderna Sars-Covid-2 Vaccination 09/20/2019, 10/22/2019, 02/24/2020, 04/25/2020   Pneumococcal Conjugate-13 09/05/2013   Pneumococcal Polysaccharide-23 03/12/2015   Respiratory Syncytial Virus Vaccine,Recomb Aduvanted(Arexvy) 03/14/2022   Td 01/09/2017   Tdap 01/09/2017   Zoster Recombinant(Shingrix ) 03/14/2022, 10/10/2022   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   Social History   Socioeconomic History   Marital status: Divorced    Spouse name: Not on file   Number of children: 1  Years of education: Not on file    Highest education level: Some college, no degree  Occupational History   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  Tobacco Use   Smoking status: Every Day    Current packs/day: 0.00    Average packs/day: 0.5 packs/day for 41.3 years (20.7 ttl pk-yrs)    Types: Cigarettes    Start date: 10/18/1977    Last attempt to quit: 02/10/2019    Years since quitting: 4.7   Smokeless tobacco: Never  Vaping Use   Vaping status: Never Used  Substance and Sexual Activity   Alcohol use: No   Drug use: No   Sexual activity: Not Currently  Other Topics Concern   Not on file  Social History Narrative   Not on file   Social Drivers of Health   Financial Resource Strain: Low Risk  (03/14/2023)   Overall Financial Resource Strain (CARDIA)    Difficulty of Paying Living Expenses: Not hard at all  Food Insecurity: No Food Insecurity (03/14/2023)   Hunger Vital Sign    Worried About Running Out of Food in the Last Year: Never true    Ran Out of Food in the Last Year: Never true  Transportation Needs: No Transportation Needs (03/14/2023)   PRAPARE - Administrator, Civil Service (Medical): No    Lack of Transportation (Non-Medical): No  Physical Activity: Insufficiently Active (03/14/2023)   Exercise Vital Sign    Days of Exercise per Week: 2 days    Minutes of Exercise per Session: 60 min  Stress: No Stress Concern Present (03/14/2023)   [employer] of Occupational Health - Occupational Stress Questionnaire    Feeling of Stress : Not at all  Social Connections: Moderately Integrated (03/14/2023)   Social Connection and Isolation Panel [NHANES]    Frequency of Communication with Friends and Family: More than three times a week    Frequency of Social Gatherings with Friends and Family: More than three times a week    Attends Religious Services: 1 to 4 times per year    Active Member of [employer] or Organizations: Yes    Attends Engineer, structural: More than 4 times  per year    Marital Status: Divorced  Intimate Partner Violence: Not At Risk (03/14/2023)   Humiliation, Afraid, Rape, and Kick questionnaire    Fear of Current or Ex-Partner: No    Emotionally Abused: No    Physically Abused: No    Sexually Abused: No   Past Surgical History:  Procedure Laterality Date   ANTERIOR LUMBAR FUSION N/A 03/06/2019   Procedure: L4-5 XLIF, L4-5 PSF, LEFT SYNOVIAL CYST RESECTION L4-5;  Surgeon: Jodeen Munch, MD;  Location: [facility];  Service: Neurosurgery;  Laterality: N/A;   BACK SURGERY     BIOPSY  01/28/2020   Procedure: BIOPSY;  Surgeon: Urban Garden, MD;  Location: [facility];  Service: Gastroenterology;;   CHOLECYSTECTOMY     COLONOSCOPY WITH PROPOFOL  N/A 01/28/2020   Procedure: COLONOSCOPY WITH PROPOFOL ;  Surgeon: Urban Garden, MD;  Location: [facility];  Service: Gastroenterology;  Laterality: N/A;  915   CYSTOSTOMY W/ BLADDER BIOPSY  1990   punctured hole in bladder, had open procedure to repair   ESOPHAGOGASTRODUODENOSCOPY (EGD) WITH PROPOFOL  N/A 06/08/2022   Procedure: ESOPHAGOGASTRODUODENOSCOPY (EGD) WITH PROPOFOL ;  Surgeon: Urban Garden, MD;  Location: [facility];  Service: Gastroenterology;  Laterality: N/A;  2:30PM   MENISCUS REPAIR  TONSILLECTOMY     TOTAL VAGINAL HYSTERECTOMY     63 y/o. bleeding. ovaries left in situ   Family History  Problem Relation Age of Onset   Diabetes Mother    Hypertension Father    Colon cancer Neg Hx    Esophageal cancer Neg Hx    Rectal cancer Neg Hx    Stomach cancer Neg Hx    Breast cancer Neg Hx     Current Outpatient Medications:    acetaminophen  (TYLENOL ) 500 MG tablet, Take 1,000 mg by mouth at bedtime., Disp: , Rfl:    albuterol  (VENTOLIN  HFA) 108 (90 Base) MCG/ACT inhaler, INHALE 2 PUFFS BY MOUTH EVERY 6 HOURS AS NEEDED FOR WHEEZING AND FOR SHORTNESS OF BREATH (Patient taking differently: Inhale 2 puffs into the lungs at bedtime.), Disp: 9 g, Rfl:  0   atenolol  (TENORMIN ) 50 MG tablet, Take 1 tablet (50 mg total) by mouth daily., Disp: 90 tablet, Rfl: 3   atorvastatin  (LIPITOR) 20 MG tablet, Take 1 tablet (20 mg total) by mouth daily., Disp: 90 tablet, Rfl: 3   Budeson-Glycopyrrol-Formoterol  (BREZTRI  AEROSPHERE) 160-9-4.8 MCG/ACT AERO, Inhale 2 puffs into the lungs 2 (two) times daily., Disp: 32.1 g, Rfl: 5   cyclobenzaprine  (FLEXERIL ) 5 MG tablet, Take 1 tablet (5 mg total) by mouth 3 (three) times daily as needed for muscle spasms., Disp: 30 tablet, Rfl: 1   diphenhydrAMINE  (BENADRYL ) 25 MG tablet, Take 25 mg by mouth at bedtime., Disp: , Rfl:    doxycycline  (VIBRA -TABS) 100 MG tablet, Take 1 tablet (100 mg total) by mouth 2 (two) times daily. 1 po bid, Disp: 20 tablet, Rfl: 0   fluticasone  (FLONASE ) 50 MCG/ACT nasal spray, Place 2 sprays into both nostrils daily., Disp: 16 g, Rfl: 6   ketoconazole  (NIZORAL ) 2 % cream, Apply 1 Application topically daily., Disp: 30 g, Rfl: 1   lisinopril  (ZESTRIL ) 20 MG tablet, Take 1 tablet (20 mg total) by mouth daily., Disp: 90 tablet, Rfl: 3   Melatonin 10 MG TABS, Take 10 mg by mouth at bedtime., Disp: , Rfl:    Omega 3-6-9 Fatty Acids (TRIPLE OMEGA COMPLEX PO), Take 1 capsule by mouth daily., Disp: , Rfl:   Allergies  Allergen Reactions   Penicillins Hives   Meloxicam Hypertension   Bactrim [Sulfamethoxazole-Trimethoprim] Rash     ROS: Review of Systems A comprehensive review of systems was negative except for: Cardiovascular: positive for occasional blue/purple hue of the feet Gastrointestinal: positive for constipation Genitourinary: positive for frequency and hesitancy Musculoskeletal: positive for arthralgias and back pain    Physical exam BP (!) 141/81   Pulse 60   Temp 98.7 F (37.1 C)   Ht 5\' 5"  (1.651 m)   Wt 144 lb (65.3 kg)   SpO2 97%   BMI 23.96 kg/m  General appearance: alert, cooperative, appears stated age, and no distress Head: Normocephalic, without obvious  abnormality, atraumatic Eyes: negative findings: lids and lashes normal, conjunctivae and sclerae normal, corneas clear, and pupils equal, round, reactive to light and accomodation Ears: normal TM's and external ear canals both ears Nose: Nares normal. Septum midline. Mucosa normal. No drainage or sinus tenderness. Throat: lips, mucosa, and tongue normal; teeth and gums normal Neck: no adenopathy, supple, symmetrical, trachea midline, and thyroid  not enlarged, symmetric, no tenderness/mass/nodules Back: symmetric, no curvature. ROM normal. No CVA tenderness. Lungs:  Mild global expiratory wheezes noted.  Occasional coughing during exam Heart: regular rate and rhythm, S1, S2 normal, no murmur, click, rub or gallop  Abdomen: soft, non-tender; bowel sounds normal; no masses,  no organomegaly Extremities: extremities normal, atraumatic, no cyanosis or edema Pulses: 2+ and symmetric Skin: Skin color, texture, turgor normal. No rashes or lesions Lymph nodes: Cervical, supraclavicular, and axillary nodes normal. Neurologic: Alert and oriented X 3, normal strength and tone. Normal symmetric reflexes. Normal coordination and gait      10/24/2023    8:59 AM 08/10/2023    9:21 AM 06/20/2023   10:32 AM  Depression screen PHQ 2/9  Decreased Interest 0 0 0  Down, Depressed, Hopeless 0 0 0  PHQ - 2 Score 0 0 0  Altered sleeping 0  0  Tired, decreased energy 0  0  Change in appetite 0  0  Feeling bad or failure about yourself  0  0  Trouble concentrating 0  0  Moving slowly or fidgety/restless 0  0  Suicidal thoughts 0  0  PHQ-9 Score 0  0  Difficult doing work/chores Not difficult at all  Not difficult at all      10/24/2023    8:59 AM 06/20/2023   10:32 AM 02/28/2023   10:33 AM 07/06/2022   11:48 AM  GAD 7 : Generalized Anxiety Score  Nervous, Anxious, on Edge 0 0 0 0  Control/stop worrying 0 0 0 0  Worry too much - different things 0 0 0 0  Trouble relaxing 0 0 0 0  Restless 0 0 0 0  Easily  annoyed or irritable 0 0 0 0  Afraid - awful might happen 0 0 0 0  Total GAD 7 Score 0 0 0 0  Anxiety Difficulty Not difficult at all  Not difficult at all Not difficult at all     Assessment/ Plan: Nancy Thornton here for annual physical exam.   Annual physical exam  Feeling of incomplete bladder emptying - Plan: Ambulatory referral to Urogynecology  Other constipation  History of hysterectomy - Plan: Ambulatory referral to Urogynecology  Type 2 diabetes mellitus with hyperglycemia, without long-term current use of insulin  (HCC) - Plan: CBC with Differential/Platelet, CMP14+EGFR, Bayer DCA Hb A1c Waived  Hypertension associated with diabetes (HCC) - Plan: CMP14+EGFR  Hyperlipidemia associated with type 2 diabetes mellitus (HCC) - Plan: CMP14+EGFR, Lipid panel, TSH  Coronary artery disease involving native coronary artery of native heart without angina pectoris - Plan: CMP14+EGFR, Lipid panel, TSH  Tobacco use - Plan: Ambulatory Referral Lung Cancer Screening [facility]  Encounter for screening for HIV - Plan: HIV Antibody (routine testing w rflx)  Nonfasting labs collected today.  She is going to get her vaccination history from her pharmacy because she think she had pneumococcal vaccination done there.  For her incomplete bladder emptying I am going to refer her to urogynecology.  I question cystocele and even may be a rectocele given reports of constipation requiring manual disimpaction.  I have encouraged her to increase fiber intake and continue hydration with water  She will continue all medications as prescribed for now to prevent progression of CAD  Referral for lung cancer screening placed  Counseled on healthy lifestyle choices, including diet (rich in fruits, vegetables and lean meats and low in salt and simple carbohydrates) and exercise (at least 30 minutes of moderate physical activity daily).  Patient to follow up 6 months for diabetes  Monik Lins M.  Bonnell Butcher, DO

## 2023-10-25 ENCOUNTER — Encounter: Payer: Self-pay | Admitting: Family Medicine

## 2023-10-25 DIAGNOSIS — M9902 Segmental and somatic dysfunction of thoracic region: Secondary | ICD-10-CM | POA: Diagnosis not present

## 2023-10-25 DIAGNOSIS — M9903 Segmental and somatic dysfunction of lumbar region: Secondary | ICD-10-CM | POA: Diagnosis not present

## 2023-10-25 DIAGNOSIS — M9901 Segmental and somatic dysfunction of cervical region: Secondary | ICD-10-CM | POA: Diagnosis not present

## 2023-10-25 DIAGNOSIS — M62830 Muscle spasm of back: Secondary | ICD-10-CM | POA: Diagnosis not present

## 2023-10-26 LAB — LIPID PANEL
Chol/HDL Ratio: 2.2 ratio (ref 0.0–4.4)
Cholesterol, Total: 124 mg/dL (ref 100–199)
HDL: 56 mg/dL (ref >39)
LDL Chol Calc (NIH): 44 mg/dL (ref 0–99)
Triglycerides: 141 mg/dL (ref 0–149)
VLDL Cholesterol Cal: 24 mg/dL (ref 5–40)

## 2023-10-26 LAB — CMP14+EGFR
ALT: 17 IU/L (ref 0–32)
AST: 25 IU/L (ref 0–40)
Albumin: 4.3 g/dL (ref 3.9–4.9)
Alkaline Phosphatase: 65 IU/L (ref 44–121)
BUN/Creatinine Ratio: 15 (ref 12–28)
BUN: 13 mg/dL (ref 8–27)
Bilirubin Total: 1 mg/dL (ref 0.0–1.2)
CO2: 25 mmol/L (ref 20–29)
Calcium: 9.3 mg/dL (ref 8.7–10.3)
Chloride: 100 mmol/L (ref 96–106)
Creatinine, Ser: 0.89 mg/dL (ref 0.57–1.00)
Globulin, Total: 1.6 g/dL (ref 1.5–4.5)
Glucose: 210 mg/dL — ABNORMAL HIGH (ref 70–99)
Potassium: 4.1 mmol/L (ref 3.5–5.2)
Sodium: 138 mmol/L (ref 134–144)
Total Protein: 5.9 g/dL — ABNORMAL LOW (ref 6.0–8.5)
eGFR: 73 mL/min/{1.73_m2} (ref >59)

## 2023-10-26 LAB — CBC WITH DIFFERENTIAL/PLATELET
Basophils Absolute: 0.1 10*3/uL (ref 0.0–0.2)
Basos: 1 %
EOS (ABSOLUTE): 0.2 10*3/uL (ref 0.0–0.4)
Eos: 2 %
Hematocrit: 45.5 % (ref 34.0–46.6)
Hemoglobin: 16 g/dL — ABNORMAL HIGH (ref 11.1–15.9)
Immature Grans (Abs): 0 10*3/uL (ref 0.0–0.1)
Immature Granulocytes: 0 %
Lymphocytes Absolute: 1.1 10*3/uL (ref 0.7–3.1)
Lymphs: 13 %
MCH: 32.5 pg (ref 26.6–33.0)
MCHC: 35.2 g/dL (ref 31.5–35.7)
MCV: 92 fL (ref 79–97)
Monocytes Absolute: 0.4 10*3/uL (ref 0.1–0.9)
Monocytes: 5 %
Neutrophils Absolute: 6.3 10*3/uL (ref 1.4–7.0)
Neutrophils: 79 %
Platelets: 248 10*3/uL (ref 150–450)
RBC: 4.93 x10E6/uL (ref 3.77–5.28)
RDW: 11.8 % (ref 11.7–15.4)
WBC: 8 10*3/uL (ref 3.4–10.8)

## 2023-10-26 LAB — TSH: TSH: 1.62 u[IU]/mL (ref 0.450–4.500)

## 2023-10-26 LAB — HIV ANTIBODY (ROUTINE TESTING W REFLEX)

## 2023-10-30 DIAGNOSIS — M62830 Muscle spasm of back: Secondary | ICD-10-CM | POA: Diagnosis not present

## 2023-10-30 DIAGNOSIS — M9901 Segmental and somatic dysfunction of cervical region: Secondary | ICD-10-CM | POA: Diagnosis not present

## 2023-10-30 DIAGNOSIS — M9903 Segmental and somatic dysfunction of lumbar region: Secondary | ICD-10-CM | POA: Diagnosis not present

## 2023-10-30 DIAGNOSIS — M9902 Segmental and somatic dysfunction of thoracic region: Secondary | ICD-10-CM | POA: Diagnosis not present

## 2023-11-01 DIAGNOSIS — M9901 Segmental and somatic dysfunction of cervical region: Secondary | ICD-10-CM | POA: Diagnosis not present

## 2023-11-01 DIAGNOSIS — M9903 Segmental and somatic dysfunction of lumbar region: Secondary | ICD-10-CM | POA: Diagnosis not present

## 2023-11-01 DIAGNOSIS — M9902 Segmental and somatic dysfunction of thoracic region: Secondary | ICD-10-CM | POA: Diagnosis not present

## 2023-11-01 DIAGNOSIS — M62830 Muscle spasm of back: Secondary | ICD-10-CM | POA: Diagnosis not present

## 2023-11-06 DIAGNOSIS — M9903 Segmental and somatic dysfunction of lumbar region: Secondary | ICD-10-CM | POA: Diagnosis not present

## 2023-11-06 DIAGNOSIS — M9901 Segmental and somatic dysfunction of cervical region: Secondary | ICD-10-CM | POA: Diagnosis not present

## 2023-11-06 DIAGNOSIS — M62830 Muscle spasm of back: Secondary | ICD-10-CM | POA: Diagnosis not present

## 2023-11-06 DIAGNOSIS — M9902 Segmental and somatic dysfunction of thoracic region: Secondary | ICD-10-CM | POA: Diagnosis not present

## 2023-11-08 DIAGNOSIS — M9902 Segmental and somatic dysfunction of thoracic region: Secondary | ICD-10-CM | POA: Diagnosis not present

## 2023-11-08 DIAGNOSIS — M9903 Segmental and somatic dysfunction of lumbar region: Secondary | ICD-10-CM | POA: Diagnosis not present

## 2023-11-08 DIAGNOSIS — M62830 Muscle spasm of back: Secondary | ICD-10-CM | POA: Diagnosis not present

## 2023-11-08 DIAGNOSIS — M9901 Segmental and somatic dysfunction of cervical region: Secondary | ICD-10-CM | POA: Diagnosis not present

## 2023-11-13 ENCOUNTER — Telehealth: Payer: Self-pay

## 2023-11-13 DIAGNOSIS — Z87891 Personal history of nicotine dependence: Secondary | ICD-10-CM

## 2023-11-13 DIAGNOSIS — F17210 Nicotine dependence, cigarettes, uncomplicated: Secondary | ICD-10-CM

## 2023-11-13 DIAGNOSIS — Z122 Encounter for screening for malignant neoplasm of respiratory organs: Secondary | ICD-10-CM

## 2023-11-13 NOTE — Telephone Encounter (Signed)
 Lung Cancer Screening Narrative/Criteria Questionnaire (Cigarette Smokers Only- No Cigars/Pipes/vapes)   Nancy Thornton   SDMV:11/22/2023 at 11:30 am with Mathis Som       [birth year]   LDCT: 11/29/2023 at 9:30 am at [facility]    63 y.o.   Phone: [phone]  Lung Screening Narrative (confirm age 2-77 yrs Medicare / 50-80 yrs Private pay insurance)   Insurance information: [employer]   Referring Provider:Gottschalk, MD   This screening involves an initial phone call with a team member from our program. It is called a shared decision making visit. The initial meeting is required by  insurance and Medicare to make sure you understand the program. This appointment takes about 15-20 minutes to complete. You will complete the screening scan at your scheduled date/time.  This scan takes about 5-10 minutes to complete. You can eat and drink normally before and after the scan.  Criteria questions for Lung Cancer Screening:   Are you a current or former smoker? Current Age began smoking: 12   If you are a former smoker, what year did you quit smoking? (within 15 yrs)   To calculate your smoking history, I need an accurate estimate of how many packs of cigarettes you smoked per day and for how many years. (Not just the number of PPD you are now smoking)   Years smoking 44 x Packs per day 1 = Pack years 44   (at least 20 pack yrs)   (Make sure they understand that we need to know how much they have smoked in the past, not just the number of PPD they are smoking now)  Do you have a personal history of cancer?  No    Do you have a family history of cancer? No  Are you coughing up blood?  No  Have you had unexplained weight loss of 15 lbs or more in the last 6 months? No  It looks like you meet all criteria.  When would be a good time for us  to schedule you for this screening?   Additional information: N/A

## 2023-11-15 DIAGNOSIS — M9901 Segmental and somatic dysfunction of cervical region: Secondary | ICD-10-CM | POA: Diagnosis not present

## 2023-11-15 DIAGNOSIS — M62830 Muscle spasm of back: Secondary | ICD-10-CM | POA: Diagnosis not present

## 2023-11-15 DIAGNOSIS — M9903 Segmental and somatic dysfunction of lumbar region: Secondary | ICD-10-CM | POA: Diagnosis not present

## 2023-11-15 DIAGNOSIS — M9902 Segmental and somatic dysfunction of thoracic region: Secondary | ICD-10-CM | POA: Diagnosis not present

## 2023-11-22 DIAGNOSIS — M9902 Segmental and somatic dysfunction of thoracic region: Secondary | ICD-10-CM | POA: Diagnosis not present

## 2023-11-22 DIAGNOSIS — M9903 Segmental and somatic dysfunction of lumbar region: Secondary | ICD-10-CM | POA: Diagnosis not present

## 2023-11-22 DIAGNOSIS — M9901 Segmental and somatic dysfunction of cervical region: Secondary | ICD-10-CM | POA: Diagnosis not present

## 2023-11-22 DIAGNOSIS — M62830 Muscle spasm of back: Secondary | ICD-10-CM | POA: Diagnosis not present

## 2023-11-28 ENCOUNTER — Ambulatory Visit: Admitting: *Deleted

## 2023-11-28 DIAGNOSIS — F17210 Nicotine dependence, cigarettes, uncomplicated: Secondary | ICD-10-CM | POA: Diagnosis not present

## 2023-11-28 NOTE — Progress Notes (Signed)
 Virtual Visit via Telephone Note  I connected with Nancy Thornton on 11/28/23 at 11:30 AM EDT by telephone and verified that I am speaking with the correct person using two identifiers.  Location: Patient: Nancy Thornton Provider: Alyse Bach, RN   I discussed the limitations, risks, security and privacy concerns of performing an evaluation and management service by telephone and the availability of in person appointments. I also discussed with the patient that there may be a patient responsible charge related to this service. The patient expressed understanding and agreed to proceed.    Shared Decision Making Visit Lung Cancer Screening Program [phone])   Eligibility: Age 63 y.o. Pack Years Smoking History Calculation 44 (# packs/per year x # years smoked) Recent History of coughing up blood  no Unexplained weight loss? no ( >Than 15 pounds within the last 6 months ) Prior History Lung / other cancer no (Diagnosis within the last 5 years already requiring surveillance chest CT Scans). Smoking Status Current Smoker Former Smokers: Years since quit: n/a  Quit Date: n/a  Visit Components: Discussion included one or more decision making aids. yes Discussion included risk/benefits of screening. yes Discussion included potential follow up diagnostic testing for abnormal scans. yes Discussion included meaning and risk of over diagnosis. yes Discussion included meaning and risk of False Positives. yes Discussion included meaning of total radiation exposure. yes  Counseling Included: Importance of adherence to annual lung cancer LDCT screening. yes Impact of comorbidities on ability to participate in the program. yes Ability and willingness to under diagnostic treatment. yes  Smoking Cessation Counseling: Current Smokers:  Discussed importance of smoking cessation. yes Information about tobacco cessation classes and interventions provided to patient. yes Patient provided with  "ticket" for LDCT Scan. no Symptomatic Patient. no  Counseling(Intermediate counseling: > three minutes) [id number] Diagnosis Code: Tobacco Use Z72.0 Asymptomatic Patient yes  Counseling (Intermediate counseling: > three minutes counseling) [id number] Former Smokers:  Discussed the importance of maintaining cigarette abstinence. yes Diagnosis Code: Personal History of Nicotine Dependence. Q46.962 Information about tobacco cessation classes and interventions provided to patient. Yes Patient provided with "ticket" for LDCT Scan. no Written Order for Lung Cancer Screening with LDCT placed in Epic. Yes (CT Chest Lung Cancer Screening Low Dose W/O CM) [id number] Z12.2-Screening of respiratory organs Z87.891-Personal history of nicotine dependence   Alyse Bach, RN

## 2023-11-28 NOTE — Patient Instructions (Signed)

## 2023-11-29 ENCOUNTER — Ambulatory Visit (HOSPITAL_COMMUNITY)
Admission: RE | Admit: 2023-11-29 | Discharge: 2023-11-29 | Disposition: A | Discharge status: Home / Self Care | Source: Ambulatory Visit | Attending: Family Medicine | Admitting: Family Medicine

## 2023-11-29 DIAGNOSIS — F17210 Nicotine dependence, cigarettes, uncomplicated: Secondary | ICD-10-CM | POA: Insufficient documentation

## 2023-11-29 DIAGNOSIS — Z122 Encounter for screening for malignant neoplasm of respiratory organs: Secondary | ICD-10-CM | POA: Insufficient documentation

## 2023-11-29 DIAGNOSIS — Z87891 Personal history of nicotine dependence: Secondary | ICD-10-CM | POA: Diagnosis not present

## 2023-12-15 ENCOUNTER — Other Ambulatory Visit: Payer: Self-pay

## 2023-12-15 DIAGNOSIS — F17210 Nicotine dependence, cigarettes, uncomplicated: Secondary | ICD-10-CM

## 2023-12-15 DIAGNOSIS — Z87891 Personal history of nicotine dependence: Secondary | ICD-10-CM

## 2023-12-15 DIAGNOSIS — Z122 Encounter for screening for malignant neoplasm of respiratory organs: Secondary | ICD-10-CM

## 2023-12-21 NOTE — Progress Notes (Signed)
 Cardiology Office Note    Date:  12/23/2023   ID:  Nancy Thornton, DOB 07-26-1960, MRN [id number]  PCP:  [name], DO  Cardiologist: Vina Gull, MD    F/U of CAD and HTN  History of Present Illness:    Nancy Thornton is a 63 y.o. 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    Past Surgical History:  Procedure Laterality Date   ANTERIOR LUMBAR FUSION N/A 03/06/2019   Procedure: L4-5 XLIF, L4-5 PSF, LEFT SYNOVIAL CYST RESECTION L4-5;  Surgeon: Clois Fret, MD;  Location: [facility];  Service: Neurosurgery;  Laterality: N/A;   BACK SURGERY     BIOPSY  01/28/2020   Procedure: BIOPSY;  Surgeon: Eartha Angelia Sieving, MD;  Location: [facility];  Service: Gastroenterology;;   CHOLECYSTECTOMY     COLONOSCOPY WITH PROPOFOL  N/A 01/28/2020   Procedure: COLONOSCOPY WITH PROPOFOL ;  Surgeon: Eartha Angelia Sieving, MD;  Location: [facility];  Service: Gastroenterology;  Laterality: N/A;  915   CYSTOSTOMY W/ BLADDER BIOPSY  1990   punctured hole in bladder, had open procedure to repair   ESOPHAGOGASTRODUODENOSCOPY (EGD) WITH PROPOFOL  N/A 06/08/2022   Procedure: ESOPHAGOGASTRODUODENOSCOPY (EGD) WITH PROPOFOL ;  Surgeon: Eartha Angelia Sieving, MD;  Location: [facility];  Service: Gastroenterology;  Laterality: N/A;  2:30PM   MENISCUS REPAIR     TONSILLECTOMY     TOTAL VAGINAL HYSTERECTOMY     63 y/o. bleeding. ovaries left in situ    Current  Medications: Outpatient Medications Prior to Visit  Medication Sig Dispense Refill   acetaminophen  (TYLENOL ) 500 MG tablet Take 1,000 mg by mouth at bedtime.     albuterol  (VENTOLIN  HFA) 108 (90 Base) MCG/ACT inhaler INHALE 2 PUFFS BY MOUTH EVERY 6 HOURS AS NEEDED FOR WHEEZING AND FOR SHORTNESS OF BREATH (Patient taking differently: Inhale 2 puffs into the lungs at bedtime.) 9 g 0   atenolol  (TENORMIN ) 50 MG tablet Take 1 tablet (50 mg total) by mouth daily. 90 tablet 3   atorvastatin  (LIPITOR) 20 MG tablet Take 1 tablet (20 mg total) by mouth daily. 90 tablet 3   Budeson-Glycopyrrol-Formoterol  (BREZTRI  AEROSPHERE) 160-9-4.8 MCG/ACT AERO Inhale 2 puffs into the lungs 2 (two) times daily. 32.1 g 5   cyclobenzaprine  (FLEXERIL ) 5 MG tablet Take 1 tablet (5 mg total) by mouth 3 (three) times daily as needed for muscle spasms. 30 tablet 1   diphenhydrAMINE  (BENADRYL ) 25 MG tablet Take 25 mg by mouth at bedtime.     doxycycline  (VIBRA -TABS) 100 MG tablet Take 1 tablet (100 mg total) by mouth 2 (two) times daily. 1 po bid 20 tablet 0   fluticasone  (FLONASE ) 50 MCG/ACT nasal spray Place 2 sprays into both nostrils daily. 16 g 6   gabapentin  (NEURONTIN ) 300 MG capsule Take  300 mg by mouth 2 (two) times daily.     ketoconazole  (NIZORAL ) 2 % cream Apply 1 Application topically daily. 30 g 1   lisinopril  (ZESTRIL ) 20 MG tablet Take 1 tablet (20 mg total) by mouth daily. 90 tablet 3   Melatonin 10 MG TABS Take 10 mg by mouth at bedtime.     Omega 3-6-9 Fatty Acids (TRIPLE OMEGA COMPLEX PO) Take 1 capsule by mouth daily.     No facility-administered medications prior to visit.     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  Tobacco Use   Smoking status: Every Day    Current packs/day: 1.00    Average packs/day: 1 pack/day for 44.2 years (44.2 ttl pk-yrs)    Types: Cigarettes    Start date: 10/19/1979   Smokeless tobacco: Never  Vaping Use   Vaping status: Never Used  Substance and Sexual Activity   Alcohol use: No   Drug use: No   Sexual activity: Not Currently  Other Topics Concern   Not on file  Social History Narrative   Not on file   Social Drivers of Health   Financial Resource Strain: Low Risk  (03/14/2023)   Overall Financial Resource Strain (CARDIA)    Difficulty of Paying Living Expenses: Not hard at all  Food Insecurity: No Food Insecurity (03/14/2023)   Hunger Vital Sign    Worried About Running Out of Food in the Last Year: Never true    Ran Out of Food in the Last Year: Never true  Transportation Needs: No Transportation Needs (03/14/2023)   PRAPARE - Administrator, Civil Service (Medical): No    Lack of Transportation (Non-Medical): No  Physical Activity: Insufficiently Active (03/14/2023)   Exercise Vital Sign    Days of Exercise per Week: 2 days    Minutes of Exercise per Session: 60 min  Stress: No Stress Concern Present (03/14/2023)   [employer] of Occupational Health - Occupational Stress Questionnaire    Feeling of Stress : Not at all  Social Connections: Moderately Integrated (03/14/2023)   Social Connection and Isolation Panel    Frequency of Communication with Friends and Family: More than three times a week    Frequency of Social Gatherings with Friends and Family: More than three times a week    Attends Religious Services: 1 to 4 times per year    Active Member of [employer] or Organizations: Yes    Attends Engineer, structural: More than 4 times per year    Marital Status: Divorced     Family History:  The patient's family history includes Diabetes in her mother; Hypertension in her father.   Review of  Systems:   Please see the history of present illness.     All other systems reviewed and are otherwise negative except as noted above.   Physical Exam:    VS:  BP (!) 150/84 (BP Location: Right Arm, Cuff Size: Normal)   Pulse 62   Ht 5' 5.5 (1.664 m)   Wt 145 lb (65.8 kg)   SpO2 95%   BMI 23.76 kg/m     General: Pt is  a 63 yo in NAD  Neck: No carotid bruits. JVP is normal   Lungs: CTA  bilaterally Heart: Regular rate and rhythm. No murmurs, Abdomen: Soft, non-tender, No hepatomegaly.    Extremities: No lower extremity edema.   Wt Readings from Last 3 Encounters:  12/22/23 145 lb (65.8 kg)  10/24/23 144 lb (65.3 kg)  08/10/23 149 lb (67.6 kg)     Studies/Labs Reviewed:   EKG:  EKG not done today   Recent Labs: 10/24/2023: ALT 17; BUN 13; Creatinine, Ser 0.89; Hemoglobin 16.0; Platelets 248; Potassium 4.1; Sodium 138; TSH 1.620   Lipid Panel    Component Value Date/Time   CHOL 124 10/24/2023 0941   CHOL 196 01/17/2013 1226   TRIG 141 10/24/2023 0941   TRIG 96 03/09/2023 1255   TRIG 176 (H) 01/17/2013 1226   HDL 56 10/24/2023 0941   HDL 60 03/09/2023 1255   HDL 57 01/17/2013 1226   CHOLHDL 2.2 10/24/2023 0941   LDLCALC 44 10/24/2023 0941   LDLCALC 58 09/05/2013 1142   LDLCALC 104 (H) 01/17/2013 1226   LDLDIRECT 54 02/16/2018 0903    Additional studies/ records that were reviewed today include:    Plan:   1 HTN BP is elevated   I would recomm adding hydrochlorothiazide  12.5 mg    Keep on atenolol  and lisinopril  CHeck BMET in 2 wks  Nursing visit for BP check in 4 to 6 wks   Bring cuff and BP log     2   CAD  pt with Ca  score of 421    No anginal symptoms     3  Dizziness.   Hx of   Pt denies   4   HL Sept 2024 LDL 61 with low particle number    Lpa less than 8.4  Cont atorvastatin     5  DM  A1C is better at 5.6    F/U in 6 months       Your physician recommends that you continue on your current medications as directed. Please refer to the  Current Medication list given to you today.  If you need a refill on your cardiac medications before your next appointment, please call your pharmacy.  No labs or tests today  Thank you for choosing [facility] !        Signed, Vina Gull, MD  12/23/2023 2:34 PM    [facility] [address] [place], [place] [address] Phone: [phone]

## 2023-12-22 ENCOUNTER — Ambulatory Visit: Attending: Internal Medicine | Admitting: Internal Medicine

## 2023-12-22 ENCOUNTER — Encounter: Payer: Self-pay | Admitting: Internal Medicine

## 2023-12-22 VITALS — BP 150/84 | HR 62 | Ht 65.5 in | Wt 145.0 lb

## 2023-12-22 DIAGNOSIS — Z79899 Other long term (current) drug therapy: Secondary | ICD-10-CM | POA: Diagnosis not present

## 2023-12-22 MED ORDER — HYDROCHLOROTHIAZIDE 12.5 MG PO CAPS: 12.5000 mg | ORAL_CAPSULE | Freq: Every day | ORAL | 3 refills | Status: DC

## 2023-12-22 NOTE — Patient Instructions (Signed)
 Medication Instructions:  Your physician has recommended you make the following change in your medication:   Start hydrochlorothiazide  12.5 mg Daily   *If you need a refill on your cardiac medications before your next appointment, please call your pharmacy*  Lab Work: Your physician recommends that you return for lab work in: 2 Weeks at [facility]   If you have labs (blood work) drawn today and your tests are completely normal, you will receive your results only by: [employer] (if you have MyChart) OR A paper copy in the mail If you have any lab test that is abnormal or we need to change your treatment, we will call you to review the results.  Testing/Procedures: NONE   Follow-Up: At [facility], you and your health needs are our priority.  As part of our continuing mission to provide you with exceptional heart care, our providers are all part of one team.  This team includes your primary Cardiologist (physician) and Advanced Practice Providers or APPs (Physician Assistants and Nurse Practitioners) who all work together to provide you with the care you need, when you need it.  Your next appointment:   6 month(s)  Provider:   You may see Ola Berger, MD or one of the following Advanced Practice Providers on your designated Care Team:   Woodfin Hays, PA-C  [place], [place] Theotis Flake, [place]     We recommend signing up for the patient portal called MyChart.  Sign up information is provided on this After Visit Summary.  MyChart is used to connect with patients for Virtual Visits (Telemedicine).  Patients are able to view lab/test results, encounter notes, upcoming appointments, etc.  Non-urgent messages can be sent to your provider as well.   To learn more about what you can do with MyChart, go to [email or web address].   Other Instructions Your physician recommends that you schedule a follow-up appointment in: 4-6 weeks for a nursing visit    Thank you for choosing [facility]!

## 2023-12-25 ENCOUNTER — Ambulatory Visit: Payer: Self-pay

## 2023-12-25 NOTE — Telephone Encounter (Signed)
 Appt scheduled with Nena 6/24

## 2023-12-25 NOTE — Telephone Encounter (Signed)
 FYI Only or Action Required?: FYI only for provider.  Patient was last seen in primary care on 10/24/2023 by [name], DO. Called Nurse Triage reporting Hypertension. Symptoms began today. Interventions attempted: Prescription medications: bp meds. Symptoms are: unchanged.  Triage Disposition: Urgent Home Treatment With Follow-up Call apt tomorrow.   Patient/caregiver understands and will follow disposition?: Yes    Copied from CRM [id number]. Topic: Clinical - Red Word Triage >> Dec 25, 2023 12:17 PM Montie POUR wrote: Red Word that prompted transfer to Nurse Triage:  Doctor wants her to take her blood pressure daily at home. Today in her left arm it read 185/102; right arm read 165/99 She is having no other symptoms Reason for Disposition  [1] Systolic BP  >= 180 OR Diastolic >= 110 AND [2] missed most recent dose of blood pressure medication  Answer Assessment - Initial Assessment Questions 1. BLOOD PRESSURE: What is the blood pressure? Did you take at least two measurements 5 minutes apart?     Today is 185/102l eft arm  165/99 right arm 2. ONSET: When did you take your blood pressure?     today 3. HOW: How did you take your blood pressure? (e.g., automatic home BP monitor, visiting nurse)     Automatic bp 4. HISTORY: Do you have a history of high blood pressure?     yes 5. MEDICINES: Are you taking any medicines for blood pressure? Have you missed any doses recently?     denies 6. OTHER SYMPTOMS: Do you have any symptoms? (e.g., blurred vision, chest pain, difficulty breathing, headache, weakness)     denies 7. PREGNANCY: Is there any chance you are pregnant? When was your last menstrual period?     na  Protocols used: Blood Pressure - High-A-AH

## 2023-12-26 ENCOUNTER — Ambulatory Visit (INDEPENDENT_AMBULATORY_CARE_PROVIDER_SITE_OTHER): Admitting: Family Medicine

## 2023-12-26 ENCOUNTER — Encounter: Payer: Self-pay | Admitting: Family Medicine

## 2023-12-26 VITALS — BP 165/102 | HR 60 | Temp 98.2°F | Ht 65.0 in | Wt 141.0 lb

## 2023-12-26 DIAGNOSIS — E1159 Type 2 diabetes mellitus with other circulatory complications: Secondary | ICD-10-CM | POA: Diagnosis not present

## 2023-12-26 DIAGNOSIS — I152 Hypertension secondary to endocrine disorders: Secondary | ICD-10-CM | POA: Diagnosis not present

## 2023-12-26 MED ORDER — HYDROCHLOROTHIAZIDE 25 MG PO TABS
25.0000 mg | ORAL_TABLET | Freq: Every day | ORAL | 3 refills | Status: DC
Start: 2023-12-26 — End: 2024-04-22

## 2023-12-26 NOTE — Progress Notes (Signed)
 Subjective: CC: HTN PCP: [name], DO YEP:Imldpooj Pryer is a 63 y.o. female presenting to clinic today for:  1. HTN Patient reports that her blood pressures have been running 160s to 180s systolic over 90s to 100s diastolic.  She saw her heart doctor about a week ago and was placed on hydrochlorothiazide  to take in addition to her baseline lisinopril  20mg .  She denies any chest pain, shortness of breath, dizziness, edema.   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    Current Outpatient Medications:    acetaminophen  (TYLENOL ) 500 MG tablet, Take 1,000 mg by mouth at bedtime., Disp: , Rfl:    albuterol  (VENTOLIN  HFA) 108 (90 Base) MCG/ACT inhaler, INHALE 2 PUFFS BY MOUTH EVERY 6 HOURS AS NEEDED FOR WHEEZING AND FOR SHORTNESS OF BREATH (Patient taking differently: Inhale 2 puffs into the lungs at bedtime.), Disp: 9 g, Rfl: 0   atenolol  (TENORMIN ) 50 MG tablet, Take 1 tablet (50 mg total) by mouth daily., Disp: 90 tablet, Rfl: 3   atorvastatin  (LIPITOR) 20 MG tablet, Take 1 tablet (20 mg total) by mouth daily., Disp: 90 tablet, Rfl: 3   Budeson-Glycopyrrol-Formoterol  (BREZTRI  AEROSPHERE) 160-9-4.8 MCG/ACT AERO, Inhale 2 puffs into the lungs 2 (two) times daily., Disp: 32.1 g, Rfl: 5   cyclobenzaprine  (FLEXERIL ) 5 MG tablet, Take 1 tablet (5 mg total) by mouth 3 (three) times daily as needed for muscle spasms., Disp: 30 tablet, Rfl: 1   diphenhydrAMINE  (BENADRYL ) 25 MG tablet, Take 25 mg by mouth at bedtime., Disp: , Rfl:    doxycycline  (VIBRA -TABS) 100 MG tablet, Take 1  tablet (100 mg total) by mouth 2 (two) times daily. 1 po bid, Disp: 20 tablet, Rfl: 0   fluticasone  (FLONASE ) 50 MCG/ACT nasal spray, Place 2 sprays into both nostrils daily., Disp: 16 g, Rfl: 6   gabapentin  (NEURONTIN ) 300 MG capsule, Take 300 mg by mouth 2 (two) times daily., Disp: , Rfl:    hydrochlorothiazide  (MICROZIDE ) 12.5 MG capsule, Take 1 capsule (12.5 mg total) by mouth daily., Disp: 90 capsule, Rfl: 3   ketoconazole  (NIZORAL ) 2 % cream, Apply 1 Application topically daily., Disp: 30 g, Rfl: 1   lisinopril  (ZESTRIL ) 20 MG tablet, Take 1 tablet (20 mg total) by mouth daily., Disp: 90 tablet, Rfl: 3   Melatonin 10 MG TABS, Take 10 mg by mouth at bedtime., Disp: , Rfl:    Omega 3-6-9 Fatty Acids (TRIPLE OMEGA COMPLEX PO), Take 1 capsule by mouth daily., Disp: , Rfl:  Social History   Socioeconomic History   Marital status: Divorced    Spouse name: Not on file   Number of children: 1   Years of education: Not on file   Highest education level: Some college, no degree  Occupational History   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  Tobacco Use   Smoking status: Every Day    Current packs/day: 1.00    Average packs/day: 1 pack/day for 44.2 years (44.2 ttl pk-yrs)  Types: Cigarettes    Start date: 10/19/1979   Smokeless tobacco: Never  Vaping Use   Vaping status: Never Used  Substance and Sexual Activity   Alcohol use: No   Drug use: No   Sexual activity: Not Currently  Other Topics Concern   Not on file  Social History Narrative   Not on file   Social Drivers of Health   Financial Resource Strain: Low Risk  (03/14/2023)   Overall Financial Resource Strain (CARDIA)    Difficulty of Paying Living Expenses: Not hard at all  Food Insecurity: No Food Insecurity (03/14/2023)   Hunger Vital Sign    Worried About Running Out of Food in the Last Year: Never true    Ran Out of Food in the Last Year: Never true  Transportation Needs: No Transportation Needs  (03/14/2023)   PRAPARE - Administrator, Civil Service (Medical): No    Lack of Transportation (Non-Medical): No  Physical Activity: Insufficiently Active (03/14/2023)   Exercise Vital Sign    Days of Exercise per Week: 2 days    Minutes of Exercise per Session: 60 min  Stress: No Stress Concern Present (03/14/2023)   [employer] of Occupational Health - Occupational Stress Questionnaire    Feeling of Stress : Not at all  Social Connections: Moderately Integrated (03/14/2023)   Social Connection and Isolation Panel    Frequency of Communication with Friends and Family: More than three times a week    Frequency of Social Gatherings with Friends and Family: More than three times a week    Attends Religious Services: 1 to 4 times per year    Active Member of [employer] or Organizations: Yes    Attends Engineer, structural: More than 4 times per year    Marital Status: Divorced  Intimate Partner Violence: Not At Risk (03/14/2023)   Humiliation, Afraid, Rape, and Kick questionnaire    Fear of Current or Ex-Partner: No    Emotionally Abused: No    Physically Abused: No    Sexually Abused: No   Family History  Problem Relation Age of Onset   Diabetes Mother    Hypertension Father    Colon cancer Neg Hx    Esophageal cancer Neg Hx    Rectal cancer Neg Hx    Stomach cancer Neg Hx    Breast cancer Neg Hx     Objective: Office vital signs reviewed. BP (!) 165/102   Pulse 60   Temp 98.2 F (36.8 C)   Ht 5' 5 (1.651 m)   Wt 141 lb (64 kg)   SpO2 96%   BMI 23.46 kg/m   Physical Examination:  General: Awake, alert, No acute distress HEENT: Sclera white.  Moist mucous membranes Cardio: regular rate and rhythm, S1S2 heard, no murmurs appreciated Pulm: clear to auscultation bilaterally, no wheezes, rhonchi or rales; normal work of breathing on room air   63 y.o. female   Hypertension associated with diabetes (HCC) - Plan: hydrochlorothiazide  (HYDRODIURIL ) 25 MG  tablet  Keep follow-up with cardiology for blood pressure recheck.  I am going to increase her hydrochlorothiazide  to 25 mg daily.  Continue adequate hydration.  Continue monitoring blood pressures at home.  Goal less than 140/90   Norene CHRISTELLA Fielding, DO [facility] [phone]

## 2023-12-26 NOTE — Progress Notes (Signed)
 Patient seen by PCP today    BP high.  Hydrochlorothiazide  was increased Please make sure she has follow up in 3 to 4 wks

## 2024-01-08 ENCOUNTER — Ambulatory Visit: Payer: Self-pay | Admitting: Internal Medicine

## 2024-01-08 ENCOUNTER — Other Ambulatory Visit (HOSPITAL_COMMUNITY)
Admission: RE | Admit: 2024-01-08 | Discharge: 2024-01-08 | Disposition: A | Discharge status: Home / Self Care | Source: Ambulatory Visit | Attending: Internal Medicine | Admitting: Internal Medicine

## 2024-01-08 ENCOUNTER — Telehealth: Payer: Self-pay | Admitting: Internal Medicine

## 2024-01-08 ENCOUNTER — Encounter: Payer: Self-pay | Admitting: Internal Medicine

## 2024-01-08 DIAGNOSIS — Z79899 Other long term (current) drug therapy: Secondary | ICD-10-CM | POA: Diagnosis not present

## 2024-01-08 LAB — BASIC METABOLIC PANEL WITH GFR
Anion gap: 10 (ref 5–15)
BUN: 15 mg/dL (ref 8–23)
CO2: 28 mmol/L (ref 22–32)
Calcium: 9.1 mg/dL (ref 8.9–10.3)
Chloride: 98 mmol/L (ref 98–111)
Creatinine, Ser: 0.88 mg/dL (ref 0.44–1.00)
GFR, Estimated: >60 mL/min (ref >60)
Glucose, Bld: 231 mg/dL — ABNORMAL HIGH (ref 70–99)
Potassium: 3.7 mmol/L (ref 3.5–5.1)
Sodium: 136 mmol/L (ref 135–145)

## 2024-01-08 NOTE — Telephone Encounter (Signed)
 Pt came into office w/her BP log, it was uploaded to her chart. Pt also stated that her PCP at [facility] Dr. Jolinda increased her medication.

## 2024-01-23 ENCOUNTER — Encounter: Payer: Self-pay | Admitting: Internal Medicine

## 2024-01-23 ENCOUNTER — Ambulatory Visit: Attending: Internal Medicine

## 2024-01-23 VITALS — BP 124/76 | HR 64 | Wt 141.0 lb

## 2024-01-23 DIAGNOSIS — Z0130 Encounter for examination of blood pressure without abnormal findings: Secondary | ICD-10-CM

## 2024-01-23 NOTE — Addendum Note (Signed)
 Addended by: Kyian Obst A on: 01/23/2024 03:31 PM   Modules accepted: Level of Service

## 2024-01-23 NOTE — Progress Notes (Signed)
 BP reviewed by Dr.Ross, will keep on HCTZ 25 mg daily which was increased by PCP

## 2024-02-05 ENCOUNTER — Ambulatory Visit (INDEPENDENT_AMBULATORY_CARE_PROVIDER_SITE_OTHER): Payer: Medicare Other

## 2024-02-05 VITALS — BP 165/102 | HR 60 | Ht 65.0 in | Wt 141.0 lb

## 2024-02-05 DIAGNOSIS — Z0000 Encounter for general adult medical examination without abnormal findings: Secondary | ICD-10-CM

## 2024-02-05 NOTE — Progress Notes (Signed)
 Subjective:   Nancy Thornton is a 63 y.o. who presents for a Medicare Wellness preventive visit.  As a reminder, Annual Wellness Visits don't include a physical exam, and some assessments may be limited, especially if this visit is performed virtually. We may recommend an in-person follow-up visit with your provider if needed.  Visit Complete: Virtual I connected with  Nancy Thornton on 02/05/24 by a audio enabled telemedicine application and verified that I am speaking with the correct person using two identifiers.  Patient Location: Home  Provider Location: Home Office  I discussed the limitations of evaluation and management by telemedicine. The patient expressed understanding and agreed to proceed.  Vital Signs: Because this visit was a virtual/telehealth visit, some criteria may be missing or patient reported. Any vitals not documented were not able to be obtained and vitals that have been documented are patient reported.  VideoDeclined- This patient declined Librarian, academic. Therefore the visit was completed with audio only.  Persons Participating in Visit: Patient.  AWV Questionnaire: No: Patient Medicare AWV questionnaire was not completed prior to this visit.  Cardiac Risk Factors include: advanced age (>67men, >83 women);diabetes mellitus;dyslipidemia;hypertension;smoking/ tobacco exposure     Objective:    Today's Vitals   02/05/24 1233 02/05/24 1235  BP: (!) 165/102   Pulse: 60   Weight: 141 lb (64 kg)   Height: 5' 5 (1.651 m)   PainSc:  7    Body mass index is 23.46 kg/m.     02/05/2024   12:41 PM 02/03/2023    9:08 AM 06/08/2022   11:47 AM 05/17/2022    9:52 AM 11/21/2020   11:41 AM 01/28/2020    7:59 AM 03/09/2019    7:13 AM  Advanced Directives  Does Patient Have a Medical Advance Directive? No No No No No No No  Would patient like information on creating a medical advance directive?  Yes (MAU/Ambulatory/Procedural Areas -  Information given) No - Patient declined Yes (MAU/Ambulatory/Procedural Areas - Information given)  No - Patient declined No - Patient declined    Current Medications (verified) Outpatient Encounter Medications as of 02/05/2024  Medication Sig   acetaminophen  (TYLENOL ) 500 MG tablet Take 1,000 mg by mouth at bedtime.   albuterol  (VENTOLIN  HFA) 108 (90 Base) MCG/ACT inhaler INHALE 2 PUFFS BY MOUTH EVERY 6 HOURS AS NEEDED FOR WHEEZING AND FOR SHORTNESS OF BREATH (Patient taking differently: Inhale 2 puffs into the lungs at bedtime.)   atenolol  (TENORMIN ) 50 MG tablet Take 1 tablet (50 mg total) by mouth daily.   atorvastatin  (LIPITOR) 20 MG tablet Take 1 tablet (20 mg total) by mouth daily.   Budeson-Glycopyrrol-Formoterol  (BREZTRI  AEROSPHERE) 160-9-4.8 MCG/ACT AERO Inhale 2 puffs into the lungs 2 (two) times daily.   cyclobenzaprine  (FLEXERIL ) 5 MG tablet Take 1 tablet (5 mg total) by mouth 3 (three) times daily as needed for muscle spasms.   diphenhydrAMINE  (BENADRYL ) 25 MG tablet Take 25 mg by mouth at bedtime.   fluticasone  (FLONASE ) 50 MCG/ACT nasal spray Place 2 sprays into both nostrils daily.   gabapentin  (NEURONTIN ) 300 MG capsule Take 300 mg by mouth 2 (two) times daily.   hydrochlorothiazide  (HYDRODIURIL ) 25 MG tablet Take 1 tablet (25 mg total) by mouth daily. **dose change   ketoconazole  (NIZORAL ) 2 % cream Apply 1 Application topically daily.   lisinopril  (ZESTRIL ) 20 MG tablet Take 1 tablet (20 mg total) by mouth daily.   Melatonin 10 MG TABS Take 10 mg by mouth at bedtime.  Omega 3-6-9 Fatty Acids (TRIPLE OMEGA COMPLEX PO) Take 1 capsule by mouth daily.   doxycycline  (VIBRA -TABS) 100 MG tablet Take 1 tablet (100 mg total) by mouth 2 (two) times daily. 1 po bid (Patient not taking: Reported on 02/05/2024)   No facility-administered encounter medications on file as of 02/05/2024.    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   Past Surgical History:  Procedure Laterality Date   ANTERIOR LUMBAR FUSION N/A 03/06/2019   Procedure: L4-5 XLIF, L4-5 PSF, LEFT SYNOVIAL CYST RESECTION L4-5;  Surgeon: Clois Fret, MD;  Location: [facility];  Service: Neurosurgery;  Laterality: N/A;   BACK SURGERY     BIOPSY  01/28/2020   Procedure: BIOPSY;  Surgeon: Eartha Angelia Sieving, MD;  Location: [facility];  Service: Gastroenterology;;   CHOLECYSTECTOMY     COLONOSCOPY WITH PROPOFOL  N/A 01/28/2020   Procedure: COLONOSCOPY WITH PROPOFOL ;  Surgeon: Eartha Angelia Sieving, MD;  Location: [facility];  Service: Gastroenterology;  Laterality: N/A;  915   CYSTOSTOMY W/ BLADDER BIOPSY  1990   punctured hole in bladder, had open procedure to repair   ESOPHAGOGASTRODUODENOSCOPY (EGD) WITH PROPOFOL  N/A 06/08/2022   Procedure: ESOPHAGOGASTRODUODENOSCOPY (EGD) WITH PROPOFOL ;  Surgeon: Eartha Angelia Sieving, MD;  Location: [facility];  Service: Gastroenterology;  Laterality: N/A;  2:30PM   MENISCUS REPAIR     TONSILLECTOMY     TOTAL VAGINAL HYSTERECTOMY     63 y/o. bleeding. 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   Tobacco Use   Smoking status: Every Day    Current packs/day: 1.00    Average packs/day: 1 pack/day for 44.3 years (44.3 ttl pk-yrs)    Types: Cigarettes    Start date: 10/19/1979   Smokeless tobacco: Never  Vaping Use   Vaping status: Never Used  Substance and Sexual Activity   Alcohol use: No   Drug use: No   Sexual activity: Not Currently  Other Topics Concern   Not on file  Social History Narrative   Not on file   Social Drivers of Health   Financial Resource Strain: Low Risk  (02/05/2024)   Overall Financial Resource Strain (CARDIA)    Difficulty of Paying Living Expenses: Not hard at all  Food Insecurity: No Food Insecurity (02/05/2024)   Hunger Vital Sign    Worried About Running Out of Food in the Last Year: Never true    Ran Out of Food in  the Last Year: Never true  Transportation Needs: No Transportation Needs (02/05/2024)   PRAPARE - Administrator, Civil Service (Medical): No    Lack of Transportation (Non-Medical): No  Physical Activity: Inactive (02/05/2024)   Exercise Vital Sign    Days of Exercise per Week: 0 days    Minutes of Exercise per Session: 0 min  Stress: No Stress Concern Present (02/05/2024)   [employer] of Occupational Health - Occupational Stress Questionnaire    Feeling of Stress: Not at all  Social Connections: Socially Isolated (02/05/2024)   Social Connection and Isolation Panel    Frequency of Communication with Friends and Family: Once a week    Frequency of Social Gatherings with Friends and Family: Once a week    Attends Religious Services: More than 4 times per year    Active Member of [employer] or Organizations: No    Attends Engineer, structural: Never    Marital Status: Divorced    Tobacco Counseling Ready to quit: No Counseling given: Yes    Clinical Intake:  Pre-visit preparation completed: Yes  Pain : 0-10 (both hands/especially on the L-hand joint pain) Pain Score: 7  Pain Type: Chronic pain Pain  Location: Hand Pain Orientation: Right, Left Pain Descriptors / Indicators: Constant Pain Frequency: Constant Pain Relieving Factors: ibuprofen  Pain Relieving Factors: ibuprofen  BMI - recorded: 23.46 Nutritional Status: BMI of 19-24  Normal Nutritional Risks: None Diabetes: Yes CBG done?: No  Lab Results  Component Value Date   HGBA1C 5.6 10/24/2023   HGBA1C 6.0 (H) 06/20/2023   HGBA1C 6.0 (H) 01/16/2023     How often do you need to have someone help you when you read instructions, pamphlets, or other written materials from your doctor or pharmacy?: 1 - Never  Interpreter Needed?: No  Information entered by :: alia t/cma   Activities of Daily Living     02/05/2024   12:41 PM  In your present state of health, do you have any difficulty performing the following activities:  Hearing? 1  Vision? 0  Difficulty concentrating or making decisions? 0  Walking or climbing stairs? 0  Dressing or bathing? 0  Doing errands, shopping? 0  Preparing Food and eating ? N  Using the Toilet? N  In the past six months, have you accidently leaked urine? N  Do you have problems with loss of bowel control? N  Managing your Medications? N  Managing your Finances? N  Housekeeping or managing your Housekeeping? N    Patient Care Team: [name], DO as PCP - General (Family Medicine) Okey Vina GAILS, MD as PCP - Cardiology (Cardiology) Billee Mliss BIRCH, [facility] as Pharmacist (Family Medicine)  I have updated your Care Teams any recent Medical Services you may have received from other providers in the past year.     Assessment:   This is a routine wellness examination for Nancy Thornton.  Hearing/Vision screen Hearing Screening - Comments:: Pt has some hearing dif-Left ear Vision Screening - Comments:: Pt wear reading glasses/pt goes to [facility] [address] In [place]/last ov 2mos ago   Goals Addressed             This Visit's Progress    Patient Stated       Pt would like to work on  her craft       Depression Screen     02/05/2024   12:43 PM 12/26/2023    8:17 AM 10/24/2023    8:59 AM 08/10/2023  9:21 AM 06/20/2023   10:32 AM 03/14/2023    1:39 PM 02/28/2023   10:33 AM  PHQ 2/9 Scores  PHQ - 2 Score 0 0 0 0 0 0 0  PHQ- 9 Score 2 0 0  0  1    Fall Risk     12/26/2023    8:17 AM 10/24/2023    8:59 AM 08/10/2023    9:21 AM 06/20/2023   10:40 AM 02/28/2023   10:33 AM  Fall Risk   Falls in the past year? 0 0 0 0 0  Number falls in past yr: 0 0 0 0   Injury with Fall? 0 0 0 0   Risk for fall due to : No Fall Risks No Fall Risks No Fall Risks No Fall Risks   Follow up Falls evaluation completed Falls evaluation completed Falls evaluation completed Education provided     MEDICARE RISK AT HOME:  Medicare Risk at Home Any stairs in or around the home?: No If so, are there any without handrails?: No Home free of loose throw rugs in walkways, pet beds, electrical cords, etc?: Yes Adequate lighting in your home to reduce risk of falls?: Yes Life alert?: No Use of a cane, walker or w/c?: No Grab bars in the bathroom?: Yes Shower chair or bench in shower?: Yes Elevated toilet seat or a handicapped toilet?: Yes  TIMED UP AND GO:  Was the test performed?  no  Cognitive Function: 6CIT completed        02/05/2024   12:42 PM 02/03/2023    9:08 AM  6CIT Screen  What Year? 0 points 0 points  What month? 0 points 0 points  What time? 0 points 0 points  Count back from 20 0 points 0 points  Months in reverse 0 points 0 points  Repeat phrase 0 points 0 points  Total Score 0 points 0 points    Immunizations Immunization History  Administered Date(s) Administered   Influenza, High Dose Seasonal PF 03/30/2018   Influenza, Quadrivalent, Recombinant, Inj, Pf 03/20/2019, 03/14/2022   Influenza, Seasonal, Injecte, Preservative Fre 04/18/2013, 04/12/2014, 03/12/2015, 04/08/2017   Influenza,inj,Quad PF,6+ Mos 04/18/2013, 04/12/2014, 03/12/2015, 04/08/2017, 04/08/2017,  03/30/2018, 02/28/2023   Influenza-Unspecified 04/18/2013, 04/12/2014, 03/12/2015, 03/12/2015, 04/08/2017, 04/08/2017, 03/30/2018, 03/20/2019, 04/25/2020   Moderna Sars-Covid-2 Vaccination 09/20/2019, 10/22/2019, 02/24/2020, 04/25/2020   Pneumococcal Conjugate-13 09/05/2013   Pneumococcal Polysaccharide-23 03/12/2015   Respiratory Syncytial Virus Vaccine,Recomb Aduvanted(Arexvy) 03/14/2022   Td 01/09/2017   Tdap 01/09/2017   Zoster Recombinant(Shingrix ) 03/14/2022, 10/10/2022    Screening Tests Health Maintenance  Topic Date Due   Pneumococcal Vaccine: 19-49 Years (3 of 3 - PCV20 or PCV21) 03/11/2020   Pneumococcal Vaccine: 50+ Years (3 of 3 - PCV20 or PCV21) 03/11/2020   FOOT EXAM  03/16/2023   INFLUENZA VACCINE  02/02/2024   COVID-19 Vaccine (5 - 2024-25 season) 02/20/2025 (Originally 03/05/2023)   OPHTHALMOLOGY EXAM  04/10/2024   HEMOGLOBIN A1C  04/24/2024   Diabetic kidney evaluation - Urine ACR  06/19/2024   Lung Cancer Screening  11/28/2024   Diabetic kidney evaluation - eGFR measurement  01/07/2025   Medicare Annual Wellness (AWV)  02/04/2025   MAMMOGRAM  03/28/2025   DTaP/Tdap/Td (3 - Td or Tdap) 01/10/2027   Colonoscopy  01/27/2030   Hepatitis C Screening  Completed   HIV Screening  Completed   Zoster Vaccines- Shingrix   Completed   Hepatitis B Vaccines  Aged Out   HPV VACCINES  Aged Out   Meningococcal B Vaccine  Aged Out    Health Maintenance  Health Maintenance Due  Topic Date Due   Pneumococcal Vaccine: 19-49 Years (3 of 3 - PCV20 or PCV21) 03/11/2020   Pneumococcal Vaccine: 50+ Years (3 of 3 - PCV20 or PCV21) 03/11/2020   FOOT EXAM  03/16/2023   INFLUENZA VACCINE  02/02/2024   Health Maintenance Items Addressed: See Nurse Notes at the end of this note  Additional Screening:  Vision Screening: Recommended annual ophthalmology exams for early detection of glaucoma and other disorders of the eye. Would you like a referral to an eye doctor? No    Dental  Screening: Recommended annual dental exams for proper oral hygiene  Community Resource Referral / Chronic Care Management: CRR required this visit?  No   CCM required this visit?  No   Plan:    I have personally reviewed and noted the following in the patient's chart:   Medical and social history Use of alcohol, tobacco or illicit drugs  Current medications and supplements including opioid prescriptions. Patient is not currently taking opioid prescriptions. Functional ability and status Nutritional status Physical activity Advanced directives List of other physicians Hospitalizations, surgeries, and ER visits in previous 12 months Vitals Screenings to include cognitive, depression, and falls Referrals and appointments  In addition, I have reviewed and discussed with patient certain preventive protocols, quality metrics, and best practice recommendations. A written personalized care plan for preventive services as well as general preventive health recommendations were provided to patient.   Ozie Ned, CMA   02/05/2024   After Visit Summary: (MyChart) Due to this being a telephonic visit, the after visit summary with patients personalized plan was offered to patient via MyChart   Notes: Nothing significant to report at this time.

## 2024-02-05 NOTE — Patient Instructions (Signed)
 Nancy Thornton , Thank you for taking time out of your busy schedule to complete your Annual Wellness Visit with me. I enjoyed our conversation and look forward to speaking with you again next year. I, as well as your care team,  appreciate your ongoing commitment to your health goals. Please review the following plan we discussed and let me know if I can assist you in the future. Your Game plan/ To Do List    Referrals: If you haven't heard from the office you've been referred to, please reach out to them at the phone provided.   Follow up Visits: We will see or speak with you next year for your Next Medicare AWV with our clinical staff on 02/05/25 at 12:30p.m. Have you seen your provider in the last 6 months (3 months if uncontrolled diabetes)? Yes  Clinician Recommendations:  Aim for 30 minutes of exercise or brisk walking, 6-8 glasses of water, and 5 servings of fruits and vegetables each day.       This is a list of the screenings recommended for you:  Health Maintenance  Topic Date Due   Pneumococcal Vaccine for high risk medical condition (3 of 3 - PCV20 or PCV21) 03/11/2020   Pneumococcal Vaccine for age over 23 (3 of 3 - PCV20 or PCV21) 03/11/2020   Complete foot exam   03/16/2023   Medicare Annual Wellness Visit  02/03/2024   Flu Shot  02/02/2024   COVID-19 Vaccine (5 - 2024-25 season) 02/20/2025*   Eye exam for diabetics  04/10/2024   Hemoglobin A1C  04/24/2024   Yearly kidney health urinalysis for diabetes  06/19/2024   Screening for Lung Cancer  11/28/2024   Yearly kidney function blood test for diabetes  01/07/2025   Mammogram  03/28/2025   DTaP/Tdap/Td vaccine (3 - Td or Tdap) 01/10/2027   Colon Cancer Screening  01/27/2030   Hepatitis C Screening  Completed   HIV Screening  Completed   Zoster (Shingles) Vaccine  Completed   Hepatitis B Vaccine  Aged Out   HPV Vaccine  Aged Out   Meningitis B Vaccine  Aged Out  *Topic was postponed. The date shown is not the original due  date.    Advanced directives: (Declined) Advance directive discussed with you today. Even though you declined this today, please call our office should you change your mind, and we can give you the proper paperwork for you to fill out. Advance Care Planning is important because it:  [x]  Makes sure you receive the medical care that is consistent with your values, goals, and preferences  [x]  It provides guidance to your family and loved ones and reduces their decisional burden about whether or not they are making the right decisions based on your wishes.  Follow the link provided in your after visit summary or read over the paperwork we have mailed to you to help you started getting your Advance Directives in place. If you need assistance in completing these, please reach out to us  so that we can help you!  See attachments for Preventive Care and Fall Prevention Tips.

## 2024-02-09 ENCOUNTER — Ambulatory Visit: Admitting: Obstetrics

## 2024-02-14 ENCOUNTER — Other Ambulatory Visit: Payer: Self-pay | Admitting: Family Medicine

## 2024-02-14 DIAGNOSIS — E1169 Type 2 diabetes mellitus with other specified complication: Secondary | ICD-10-CM

## 2024-02-16 ENCOUNTER — Other Ambulatory Visit: Payer: Self-pay | Admitting: Family Medicine

## 2024-02-16 DIAGNOSIS — E1159 Type 2 diabetes mellitus with other circulatory complications: Secondary | ICD-10-CM

## 2024-02-28 ENCOUNTER — Other Ambulatory Visit: Payer: Self-pay | Admitting: Family Medicine

## 2024-02-28 DIAGNOSIS — Z1231 Encounter for screening mammogram for malignant neoplasm of breast: Secondary | ICD-10-CM

## 2024-03-21 ENCOUNTER — Other Ambulatory Visit: Payer: Self-pay | Admitting: Family Medicine

## 2024-03-21 DIAGNOSIS — I152 Hypertension secondary to endocrine disorders: Secondary | ICD-10-CM

## 2024-04-08 ENCOUNTER — Other Ambulatory Visit: Payer: Self-pay | Admitting: Family Medicine

## 2024-04-15 ENCOUNTER — Ambulatory Visit
Admission: RE | Admit: 2024-04-15 | Discharge: 2024-04-15 | Disposition: A | Source: Ambulatory Visit | Attending: Family Medicine

## 2024-04-15 DIAGNOSIS — Z1231 Encounter for screening mammogram for malignant neoplasm of breast: Secondary | ICD-10-CM

## 2024-04-17 ENCOUNTER — Encounter (INDEPENDENT_AMBULATORY_CARE_PROVIDER_SITE_OTHER): Payer: Self-pay | Admitting: Gastroenterology

## 2024-04-22 ENCOUNTER — Encounter: Payer: Self-pay | Admitting: Family Medicine

## 2024-04-22 ENCOUNTER — Ambulatory Visit (INDEPENDENT_AMBULATORY_CARE_PROVIDER_SITE_OTHER): Admitting: Family Medicine

## 2024-04-22 VITALS — BP 108/73 | HR 46 | Temp 97.4°F | Ht 65.0 in | Wt 136.5 lb

## 2024-04-22 DIAGNOSIS — J410 Simple chronic bronchitis: Secondary | ICD-10-CM | POA: Diagnosis not present

## 2024-04-22 DIAGNOSIS — I152 Hypertension secondary to endocrine disorders: Secondary | ICD-10-CM

## 2024-04-22 DIAGNOSIS — E785 Hyperlipidemia, unspecified: Secondary | ICD-10-CM | POA: Diagnosis not present

## 2024-04-22 DIAGNOSIS — E1159 Type 2 diabetes mellitus with other circulatory complications: Secondary | ICD-10-CM

## 2024-04-22 DIAGNOSIS — M2550 Pain in unspecified joint: Secondary | ICD-10-CM

## 2024-04-22 DIAGNOSIS — G2581 Restless legs syndrome: Secondary | ICD-10-CM

## 2024-04-22 DIAGNOSIS — E1169 Type 2 diabetes mellitus with other specified complication: Secondary | ICD-10-CM

## 2024-04-22 LAB — BAYER DCA HB A1C WAIVED: HB A1C (BAYER DCA - WAIVED): 5.7 % — ABNORMAL HIGH (ref 4.8–5.6)

## 2024-04-22 MED ORDER — ATENOLOL 25 MG PO TABS: 25.0000 mg | ORAL_TABLET | Freq: Every day | ORAL | 3 refills | Status: AC

## 2024-04-22 MED ORDER — LISINOPRIL 20 MG PO TABS
20.0000 mg | ORAL_TABLET | Freq: Every day | ORAL | 3 refills | Status: AC
Start: 2024-04-22 — End: ?

## 2024-04-22 MED ORDER — HYDROCHLOROTHIAZIDE 25 MG PO TABS: 25.0000 mg | ORAL_TABLET | Freq: Every day | ORAL | 3 refills | Status: AC

## 2024-04-22 MED ORDER — ATENOLOL 50 MG PO TABS
50.0000 mg | ORAL_TABLET | Freq: Every day | ORAL | 3 refills | Status: DC
Start: 2024-04-22 — End: 2024-04-22

## 2024-04-22 MED ORDER — ATORVASTATIN CALCIUM 20 MG PO TABS
20.0000 mg | ORAL_TABLET | Freq: Every day | ORAL | 3 refills | Status: AC
Start: 2024-04-22 — End: ?

## 2024-04-22 MED ORDER — ROPINIROLE HCL 0.25 MG PO TABS: ORAL_TABLET | ORAL | 0 refills | Status: DC

## 2024-04-22 NOTE — Progress Notes (Signed)
 Subjective: CC:DM PCP: [name], DO YEP:Imldpooj Ost is a 63 y.o. female presenting to clinic today for:  Type 2 Diabetes with hypertension, hyperlipidemia:  Diet controlled DM.  On Lipitor, Atenolol , hydrochlorothiazide .  She continues to have a sweet tooth and admits to eating cookies and small amounts of chocolate for dessert.  She typically has 2 structured meals per day and has been trying to incorporate more vegetables.  ROS: Denies dizziness, LOC, polyuria, polydipsia, unintended weight loss/gain, foot ulcerations, numbness or tingling in extremities, shortness of breath or chest pain.  She is compliant with her inhaler for COPD.  Arthritis She reports pain in her hands, particularly left more than right.  She has some swelling along the joints but denies any erythema, warmth or fluid collection in that area.  She is not sure if this is typical from age or if there is something else she should be worried about.  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  Restless leg She reports often she will wake up in the melanite and her legs are jumping all around.  Gabapentin  does not seem to help with this.  Wants to know if there are any other options for treatment   Diabetes Health Maintenance Due  Topic Date Due   FOOT EXAM  03/16/2023   OPHTHALMOLOGY EXAM  04/10/2024   HEMOGLOBIN A1C  04/24/2024    ROS: Per HPI  Allergies  Allergen Reactions   Penicillins Hives   Meloxicam Hypertension   Bactrim [Sulfamethoxazole-Trimethoprim] Rash   Past Medical History:  Diagnosis Date   Acute lateral meniscus tear of left knee 08/15/2017   Last Assessment & Plan:   Formatting of this note might be different from the original.  Treatment:  1.  Patient with 6 months of symptoms to the left knee and 3 months of an increased pain to the posterior lateral aspect of the knee with recurrent knee effusion and normal x-rays.   Patient has a presumptive diagnosis of the lateral meniscus tear at this time.  We will order MRI scan at this time t   Anal fissure    history of   Asthma    Complex tear of medial meniscus of left knee as current injury 08/29/2017   Last Assessment & Plan:   Formatting of this note might be different from the original.  Treatment:  1.  Patient is scheduled for left knee arthroscopy with medial meniscectomy at this time     Risk benefits alternatives reviewed in detail with the patient all questions were answered and addressed and encouraged.  Patient to select a surgical date.  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    Current Outpatient Medications:    acetaminophen  (TYLENOL ) 500 MG tablet, Take 1,000 mg by mouth at bedtime., Disp: , Rfl:    albuterol  (VENTOLIN  HFA) 108 (90 Base) MCG/ACT inhaler, INHALE 2 PUFFS BY MOUTH EVERY 6 HOURS AS NEEDED FOR WHEEZING AND FOR SHORTNESS OF BREATH (Patient taking differently: Inhale 2 puffs into the lungs at bedtime.), Disp: 9 g, Rfl: 0   Budeson-Glycopyrrol-Formoterol  (BREZTRI  AEROSPHERE) 160-9-4.8 MCG/ACT AERO, Inhale 2 puffs into  the lungs 2 (two) times daily., Disp: 32.1 g, Rfl: 5   cyclobenzaprine  (FLEXERIL ) 5 MG tablet, Take 1 tablet (5 mg total) by mouth 3 (three) times daily as needed for muscle spasms., Disp: 30 tablet, Rfl: 1   diphenhydrAMINE  (BENADRYL ) 25 MG tablet, Take 25 mg by mouth at bedtime., Disp: , Rfl:    gabapentin  (NEURONTIN ) 300 MG capsule, Take 1 capsule by mouth twice daily, Disp: 180 capsule, Rfl: 3   Melatonin 10 MG TABS, Take 10 mg by mouth at bedtime., Disp: , Rfl:    Omega 3-6-9 Fatty Acids (TRIPLE OMEGA COMPLEX PO), Take 1 capsule by mouth daily., Disp: , Rfl:    atenolol  (TENORMIN ) 50 MG  tablet, Take 1 tablet (50 mg total) by mouth daily., Disp: 90 tablet, Rfl: 3   atorvastatin  (LIPITOR) 20 MG tablet, Take 1 tablet (20 mg total) by mouth daily., Disp: 90 tablet, Rfl: 3   fluticasone  (FLONASE ) 50 MCG/ACT nasal spray, Place 2 sprays into both nostrils daily. (Patient not taking: Reported on 04/22/2024), Disp: 16 g, Rfl: 6   hydrochlorothiazide  (HYDRODIURIL ) 25 MG tablet, Take 1 tablet (25 mg total) by mouth daily., Disp: 90 tablet, Rfl: 3   ketoconazole  (NIZORAL ) 2 % cream, Apply 1 Application topically daily. 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  Tobacco Use   Smoking status: Every Day    Current packs/day: 1.00    Average packs/day: 1 pack/day for 44.5 years (44.5 ttl pk-yrs)    Types: Cigarettes    Start date: 10/19/1979   Smokeless tobacco: Never  Vaping Use   Vaping status: Never Used  Substance and Sexual Activity   Alcohol use: No   Drug use: No   Sexual activity: Not Currently  Other Topics Concern   Not on file  Social History Narrative   Not on file   Social Drivers of Health   Financial Resource Strain: Low Risk  (02/05/2024)   Overall Financial Resource Strain (CARDIA)    Difficulty of Paying Living Expenses: Not hard at all  Food Insecurity: No Food Insecurity (02/05/2024)   Hunger Vital Sign    Worried About Running Out of Food in the Last Year: Never true    Ran Out of Food in the Last Year: Never true  Transportation Needs: No Transportation Needs (02/05/2024)   PRAPARE - Administrator, Civil Service (Medical): No    Lack of  Transportation (Non-Medical): No  Physical Activity: Inactive (02/05/2024)   Exercise Vital Sign    Days of Exercise per Week: 0 days    Minutes of Exercise per Session: 0 min  Stress: No Stress Concern Present (02/05/2024)   [employer] of Occupational Health - Occupational Stress Questionnaire    Feeling of Stress: Not at all  Social Connections: Socially Isolated (02/05/2024)   Social Connection and Isolation Panel    Frequency of Communication with Friends and Family: Once a week    Frequency of Social Gatherings with Friends and Family: Once a week    Attends Religious  Services: More than 4 times per year    Active Member of Clubs or Organizations: No    Attends Banker Meetings: Never    Marital Status: Divorced  Catering manager Violence: Not At Risk (02/05/2024)   Humiliation, Afraid, Rape, and Kick questionnaire    Fear of Current or Ex-Partner: No    Emotionally Abused: No    Physically Abused: No    Sexually Abused: No   Family History  Problem Relation Age of Onset   Diabetes Mother    Hypertension Father    Colon cancer Neg Hx    Esophageal cancer Neg Hx    Rectal cancer Neg Hx    Stomach cancer Neg Hx    Breast cancer Neg Hx     Objective: Office vital signs reviewed. BP 108/73   Pulse (!) 46   Temp (!) 97.4 F (36.3 C)   Ht 5' 5 (1.651 m)   Wt 136 lb 8 oz (61.9 kg)   SpO2 98%   BMI 22.71 kg/m   Physical Examination:  General: Awake, alert, nontoxic female, No acute distress HEENT: Sclera white.  Moist mucous membranes Cardio: Bradycardic rate with regular rhythm, S1S2 heard, no murmurs appreciated Pulm: Global inspiratory and expiratory wheezes with normal work of breathing on room air and no observed coughing Extremities: Osteoarthritic changes noted to the MCP of the 1st and 2nd digits of the left hand and to the DIP joints at all fingers  Diabetic Foot Exam - Simple   Simple Foot Form Diabetic Foot exam was performed with the  following findings: Yes 04/22/2024 10:28 AM  Visual Inspection No deformities, no ulcerations, no other skin breakdown bilaterally: Yes Sensation Testing Intact to touch and monofilament testing bilaterally: Yes Pulse Check Posterior Tibialis and Dorsalis pulse intact bilaterally: Yes Comments     Lab Results  Component Value Date   HGBA1C 5.6 10/24/2023    Assessment/ Plan: 63 y.o. female   Type 2 diabetes mellitus with other specified complication, without long-term current use of insulin  (HCC) - Plan: Microalbumin / creatinine urine ratio, Bayer DCA Hb A1c Waived  Hypertension associated with diabetes (HCC) - Plan: Microalbumin / creatinine urine ratio, hydrochlorothiazide  (HYDRODIURIL ) 25 MG tablet, lisinopril  (ZESTRIL ) 20 MG tablet, atenolol  (TENORMIN ) 25 MG tablet, DISCONTINUED: atenolol  (TENORMIN ) 50 MG tablet  Hyperlipidemia associated with type 2 diabetes mellitus (HCC) - Plan: atorvastatin  (LIPITOR) 20 MG tablet  Simple chronic bronchitis (HCC)  Restless leg - Plan: rOPINIRole (REQUIP) 0.25 MG tablet, Iron, TIBC and Ferritin Panel  Polyarthralgia - Plan: ANA w/Reflex if Positive, C-reactive protein, Sedimentation Rate, Uric Acid, Rheumatoid factor  Urine micro ordered, A1c.  Foot exam performed today.  She is to schedule diabetic eye exam  Blood pressure is on the low end of normal and her heart rate is low so I am going to decrease her metoprolol to 25 mg daily and she may continue all other medicines as prescribed  Statin renewed.  Not due for fasting lipid  She had global inspiratory and expiratory wheezes on exam.  She is actively working to try and stop smoking  We will order iron studies and trial her on Requip for restless leg  Autoimmune arthritis labs drawn though I suspect this is most likely osteoarthritic and I gave her samples of Voltaren topicals   Nancy Thornton CHRISTELLA Fielding, DO [facility] [phone]

## 2024-04-22 NOTE — Patient Instructions (Signed)
 Restless Legs Syndrome: What to Know Restless legs syndrome (RLS) is a condition that makes your legs feel uncomfortable, especially while sitting or lying down. This discomfort makes you want to move your legs. Sometimes, your arms can feel this way too. RLS can be mild or severe. It can make it hard for you to sleep. What are the causes? The cause of RLS isn't known. What increases the risk? You're more likely to get RLS if: You're older than 63 years of age. You're pregnant. You're female. Someone in your family has RLS. You don't have enough iron in your body. You smoke, drink alcohol, or drink too much caffeine. You have certain health problems like kidney disease, Parkinson's disease, or nerve damage. You take certain medicines for high blood pressure, feeling like you may throw up (nausea), colds, allergies, depression, or heart problems. What are the signs or symptoms? The main symptom of RLS is uncomfortable feelings in the legs, such as: Pulling. Tingling. Prickling. Throbbing. Crawling. Burning. Usually, the feelings: Happen on both sides of your body. Get worse when you sit or lie down. Get worse at night, making it hard to sleep. Make you want to move your legs. Feel better when you move your legs or stand up. Sometimes, your arms can feel this way too. People with RLS often feel tired during the day because they don't sleep well at night. How is this diagnosed? RLS may be diagnosed based on: Your symptoms. Blood tests. In some cases, you may be monitored in a sleep lab by a specialist. This is called a sleep study. It can help find out if something is stopping you from sleeping well. How is this treated? RLS is treated by managing the symptoms. This may include: Lifestyle changes, such as exercising, using relaxation techniques, and avoiding caffeine, alcohol, or tobacco. Iron supplements. Medicines. Medicines for Parkinson's disease may be tried first. Medicines  to prevent seizures may also help. Special foot wraps made for people with RLS. Vibration pads applied to the back of your legs. A device worn on the lower legs that makes the muscles move. This helps lessen the uncomfortable feelings from RLS. Follow these instructions at home: Lifestyle  Use good sleep habits. Go to bed and wake up at the same time every day. Most adults need 7-9 hours of sleep each night. Get regular exercise. Try to get at least 30 minutes of exercise most days of the week. Find ways to relax, like deep breathing, yoga, or meditation. Avoid caffeine and alcohol. Do not smoke, vape, or use nicotine or tobacco. General instructions Take your medicines only as told. Try these methods to relieve the discomfort in your legs: Massage your legs. Walk or stretch. Take a cold or warm bath. Keep all follow-up visits. Your health care provider will check if the treatment is working. Where to find more information To learn more: Go to BasicFM.no. Click Search and type restless legs syndrome. Find the link you need. Contact a health care provider if: Your symptoms get worse. Your symptoms don't get better with treatment. This information is not intended to replace advice given to you by your health care provider. Make sure you discuss any questions you have with your health care provider. Document Revised: 10/10/2023 Document Reviewed: 10/10/2023 Elsevier Patient Education  2025 [employer].

## 2024-04-23 ENCOUNTER — Ambulatory Visit: Payer: Self-pay | Admitting: Family Medicine

## 2024-04-23 DIAGNOSIS — R7689 Other specified abnormal immunological findings in serum: Secondary | ICD-10-CM

## 2024-04-23 LAB — IRON,TIBC AND FERRITIN PANEL
Ferritin: 84 ng/mL (ref 15–150)
Iron Saturation: 40 % (ref 15–55)
Iron: 130 ug/dL (ref 27–139)
Total Iron Binding Capacity: 326 ug/dL (ref 250–450)
UIBC: 196 ug/dL (ref 118–369)

## 2024-04-23 LAB — URIC ACID: Uric Acid: 6.5 mg/dL (ref 3.0–7.2)

## 2024-04-23 LAB — ANA W/REFLEX IF POSITIVE
Anti JO-1: <0.2 AI (ref 0.0–0.9)
Anti Nuclear Antibody (ANA): POSITIVE — AB
Centromere Ab Screen: <0.2 AI (ref 0.0–0.9)
Chromatin Ab SerPl-aCnc: <0.2 AI (ref 0.0–0.9)
ENA RNP Ab: 0.5 AI (ref 0.0–0.9)
ENA SM Ab Ser-aCnc: <0.2 AI (ref 0.0–0.9)
ENA SSA (RO) Ab: <0.2 AI (ref 0.0–0.9)
ENA SSB (LA) Ab: <0.2 AI (ref 0.0–0.9)
Scleroderma (Scl-70) (ENA) Antibody, IgG: 1.6 AI — ABNORMAL HIGH (ref 0.0–0.9)
dsDNA Ab: <1 [IU]/mL (ref 0–9)

## 2024-04-23 LAB — RHEUMATOID FACTOR: Rheumatoid fact SerPl-aCnc: <10 [IU]/mL (ref <14.0)

## 2024-04-23 LAB — C-REACTIVE PROTEIN: CRP: <1 mg/L (ref 0–10)

## 2024-04-23 LAB — SEDIMENTATION RATE: Sed Rate: 2 mm/h (ref 0–40)

## 2024-04-23 LAB — MICROALBUMIN / CREATININE URINE RATIO
Creatinine, Urine: 109.4 mg/dL
Microalb/Creat Ratio: 4 mg/g{creat} (ref 0–29)
Microalbumin, Urine: 3.9 ug/mL

## 2024-04-24 NOTE — Progress Notes (Signed)
 Nancy Thornton                                          MRN: [id number]   04/24/2024   The [facility] Quality Team Specialist reviewed this patient medical record for the purposes of chart review for care gap closure. The following were reviewed: abstraction for care gap closure-kidney health evaluation for diabetes:eGFR  and uACR.    VBCI Quality Team

## 2024-04-24 NOTE — Progress Notes (Signed)
 Nancy Thornton                                          MRN: [id number]   04/24/2024   The [facility]BCI Quality Team Specialist reviewed this patient medical record for the purposes of chart review for care gap closure. The following were reviewed: abstraction for care gap closure-glycemic status assessment.    VBCI Quality Team

## 2024-05-15 ENCOUNTER — Telehealth: Payer: Self-pay

## 2024-05-15 NOTE — Telephone Encounter (Signed)
 Rec'd [employer] renewal application from patient.   Missing signature. Will call to see if ok to sign on behalf, or patient can call company to do re-enrollment over the phone [phone]).

## 2024-05-24 NOTE — Telephone Encounter (Signed)
 Spoke with patient  Ok to sign on her behalf.   PAP: re-enrollment application for Breztri  has been submitted to [employer] ([employer]), via fax

## 2024-06-10 NOTE — Progress Notes (Unsigned)
 Office Visit Note  Patient: Nancy Thornton             Date of Birth: Sep 02, 1960           MRN: [id number]             PCP: [name], DO Referring: [name], DO Visit Date: 06/24/2024 Occupation: Data Unavailable  Subjective:  No chief complaint on file.   History of Present Illness: Nancy Thornton is a 63 y.o. female ***     Activities of Daily Living:  Patient reports morning stiffness for *** {minute/hour:19697}.   Patient {ACTIONS;DENIES/REPORTS:21021675::Denies} nocturnal pain.  Difficulty dressing/grooming: {ACTIONS;DENIES/REPORTS:21021675::Denies} Difficulty climbing stairs: {ACTIONS;DENIES/REPORTS:21021675::Denies} Difficulty getting out of chair: {ACTIONS;DENIES/REPORTS:21021675::Denies} Difficulty using hands for taps, buttons, cutlery, and/or writing: {ACTIONS;DENIES/REPORTS:21021675::Denies}  No Rheumatology ROS completed.   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  Treatment:  1.  Patient with 6 months of symptoms to the left knee and 3 months of an increased pain to the posterior lateral aspect  of the knee with recurrent knee effusion and normal x-rays.  Patient has a presumptive diagnosis of the lateral meniscus tear at this time.  We will order MRI scan at this time t   Anal fissure    history of   Asthma    Complex tear of medial meniscus of left knee as current injury 08/29/2017   Last Assessment & Plan:   Formatting of this note might be different from the original.  Treatment:  1.  Patient is scheduled for left knee arthroscopy with medial meniscectomy at this time     Risk benefits alternatives reviewed in detail with the patient all questions were answered and addressed and encouraged.  Patient to select a surgical date.  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    Family History  Problem Relation Age of Onset   Diabetes Mother    Hypertension Father    Colon cancer Neg Hx    Esophageal cancer Neg Hx    Rectal cancer Neg Hx    Stomach cancer Neg Hx    Breast cancer Neg Hx    Past Surgical History:  Procedure Laterality Date   ANTERIOR LUMBAR FUSION N/A 03/06/2019   Procedure: L4-5 XLIF, L4-5 PSF, LEFT SYNOVIAL CYST RESECTION L4-5;  Surgeon: Clois Fret, MD;  Location: [facility];  Service: Neurosurgery;  Laterality: N/A;   BACK SURGERY     BIOPSY  01/28/2020   Procedure: BIOPSY;  Surgeon: Eartha Angelia Sieving, MD;  Location: [facility];  Service: Gastroenterology;;   CHOLECYSTECTOMY     COLONOSCOPY WITH PROPOFOL  N/A 01/28/2020   Procedure: COLONOSCOPY WITH PROPOFOL ;  Surgeon: Eartha Angelia Sieving, MD;  Location: [facility];  Service: Gastroenterology;  Laterality: N/A;  915   CYSTOSTOMY W/ BLADDER BIOPSY  1990   punctured hole in bladder, had open procedure to repair   ESOPHAGOGASTRODUODENOSCOPY (EGD) WITH PROPOFOL   N/A 06/08/2022   Procedure: ESOPHAGOGASTRODUODENOSCOPY (EGD) WITH PROPOFOL ;  Surgeon: Eartha Angelia Sieving, MD;  Location: [facility];  Service: Gastroenterology;  Laterality: N/A;  2:30PM   MENISCUS REPAIR     TONSILLECTOMY     TOTAL VAGINAL HYSTERECTOMY     63 y/o. bleeding. ovaries left in situ   Social History   Tobacco Use   Smoking status: Every Day    Current packs/day: 1.00    Average packs/day: 1 pack/day for 44.6 years (44.6 ttl pk-yrs)    Types: Cigarettes    Start date: 10/19/1979   Smokeless tobacco: Never  Vaping Use   Vaping status: Never Used  Substance Use Topics   Alcohol use: No   Drug use: No   Social History   Social History Narrative   Not on file     Immunization History  Administered Date(s) Administered   INFLUENZA, HIGH DOSE SEASONAL PF 03/30/2018   Influenza, Quadrivalent, Recombinant, Inj, Pf 03/20/2019, 03/14/2022, 03/25/2024   Influenza, Seasonal, Injecte, Preservative Fre 04/18/2013, 04/12/2014, 03/12/2015, 04/08/2017   Influenza,inj,Quad PF,6+ Mos 04/18/2013, 04/12/2014, 03/12/2015, 04/08/2017, 04/08/2017, 03/30/2018, 02/28/2023   Influenza-Unspecified 04/18/2013, 04/12/2014, 03/12/2015, 03/12/2015, 04/08/2017, 04/08/2017, 03/30/2018, 03/20/2019, 04/25/2020   Moderna Sars-Covid-2 Vaccination 09/20/2019, 10/22/2019, 02/24/2020, 04/25/2020   Pneumococcal Conjugate Pcv21, Polysaccharide Crm197 Conjugaf 03/25/2024   Pneumococcal Conjugate-13 09/05/2013   Pneumococcal Polysaccharide-23 03/12/2015   Respiratory Syncytial Virus Vaccine,Recomb Aduvanted(Arexvy) 03/14/2022   Td 01/09/2017   Tdap 01/09/2017   Zoster Recombinant(Shingrix ) 03/14/2022, 10/10/2022     Objective: Vital Signs: There were no vitals taken for this visit.   Physical Exam   Musculoskeletal Exam: ***  CDAI Exam: CDAI Score: -- Patient Global: --; Provider Global: -- Swollen: --; Tender: -- Joint Exam 06/24/2024   No joint exam has been documented for this  visit   There is currently no information documented on the homunculus. Go to the Rheumatology activity and complete the homunculus joint exam.  Investigation: No additional findings.  Imaging: No results found.  Recent Labs: Lab Results  Component Value Date   WBC 8.0 10/24/2023   HGB 16.0 (H) 10/24/2023   PLT 248 10/24/2023   NA 136 01/08/2024   K 3.7 01/08/2024   CL 98 01/08/2024   CO2 28 01/08/2024   GLUCOSE 231 (H) 01/08/2024   BUN 15 01/08/2024   CREATININE 0.88 01/08/2024   BILITOT 1.0 10/24/2023   ALKPHOS 65 10/24/2023   AST 25 10/24/2023   ALT 17 10/24/2023   PROT 5.9 (L) 10/24/2023   ALBUMIN 4.3 10/24/2023   CALCIUM  9.1 01/08/2024   GFRAA 104 06/29/2020    Speciality Comments: No specialty comments available.  Procedures:  No procedures performed Allergies: Penicillins, Meloxicam, and Bactrim [sulfamethoxazole-trimethoprim]   Assessment / Plan:     Visit Diagnoses: Positive ANA (antinuclear antibody) - 04/22/24: ANA+, dsDNA<1, SM-, Scl-70 1.6, Ro-, La-, Chromatin-,Anti-Jo1-,centromere ab-,CRP WNL, ESR WNL, RF-, uric acid WNL  Scl-70 antibody positive  S/P lumbar fusion  Back pain with history of spinal surgery  History of arthroscopy of knee  Coronary artery disease involving native coronary artery of native heart without angina pectoris  Hypertension associated with diabetes (HCC)  Fatty liver  Chronic diarrhea  Type 2 diabetes mellitus with hyperglycemia, without long-term current use of insulin  (HCC)  Hyperlipidemia associated with type 2 diabetes mellitus (HCC)  Tobacco use  Orders: No orders of the defined types were placed in this encounter.  No orders of the defined types were placed in this encounter.   Face-to-face time spent with patient was *** minutes. Greater than 50% of time was spent in counseling and coordination of care.  Follow-Up Instructions: No follow-ups on file.   Waddell CHRISTELLA Craze, PA-C  Note - This record has  been created using Dragon software.  Chart creation errors have been sought, but may not always  have been located. Such creation errors do not reflect on  the standard of medical care.

## 2024-06-11 NOTE — Telephone Encounter (Signed)
 PAP: Patient assistance RE-ENROLLMENT application for Breztri  has been approved by PAP Companies: [employer] from 07/04/24 to 07/03/25.   Medication should be delivered to: Home.   For further shipping updates, please contact [employer] ([employer]) at [phone].   Patient ID is: [id number]

## 2024-06-24 ENCOUNTER — Ambulatory Visit: Attending: Physician Assistant | Admitting: Physician Assistant

## 2024-06-24 ENCOUNTER — Ambulatory Visit

## 2024-06-24 ENCOUNTER — Encounter: Payer: Self-pay | Admitting: Physician Assistant

## 2024-06-24 VITALS — BP 128/73 | HR 52 | Temp 98.1°F | Resp 14 | Ht 65.5 in | Wt 142.8 lb

## 2024-06-24 DIAGNOSIS — M79642 Pain in left hand: Secondary | ICD-10-CM | POA: Diagnosis not present

## 2024-06-24 DIAGNOSIS — K529 Noninfective gastroenteritis and colitis, unspecified: Secondary | ICD-10-CM

## 2024-06-24 DIAGNOSIS — M79641 Pain in right hand: Secondary | ICD-10-CM

## 2024-06-24 DIAGNOSIS — K760 Fatty (change of) liver, not elsewhere classified: Secondary | ICD-10-CM | POA: Diagnosis not present

## 2024-06-24 DIAGNOSIS — Z9889 Other specified postprocedural states: Secondary | ICD-10-CM

## 2024-06-24 DIAGNOSIS — E1169 Type 2 diabetes mellitus with other specified complication: Secondary | ICD-10-CM

## 2024-06-24 DIAGNOSIS — I2510 Atherosclerotic heart disease of native coronary artery without angina pectoris: Secondary | ICD-10-CM

## 2024-06-24 DIAGNOSIS — Z981 Arthrodesis status: Secondary | ICD-10-CM | POA: Diagnosis not present

## 2024-06-24 DIAGNOSIS — M549 Dorsalgia, unspecified: Secondary | ICD-10-CM

## 2024-06-24 DIAGNOSIS — E1165 Type 2 diabetes mellitus with hyperglycemia: Secondary | ICD-10-CM

## 2024-06-24 DIAGNOSIS — Z720 Tobacco use: Secondary | ICD-10-CM | POA: Diagnosis not present

## 2024-06-24 DIAGNOSIS — I152 Hypertension secondary to endocrine disorders: Secondary | ICD-10-CM | POA: Diagnosis not present

## 2024-06-24 DIAGNOSIS — E1159 Type 2 diabetes mellitus with other circulatory complications: Secondary | ICD-10-CM | POA: Diagnosis not present

## 2024-06-24 DIAGNOSIS — Z8709 Personal history of other diseases of the respiratory system: Secondary | ICD-10-CM | POA: Diagnosis not present

## 2024-06-24 DIAGNOSIS — R7689 Other specified abnormal immunological findings in serum: Secondary | ICD-10-CM

## 2024-06-24 DIAGNOSIS — E785 Hyperlipidemia, unspecified: Secondary | ICD-10-CM

## 2024-06-27 ENCOUNTER — Ambulatory Visit: Payer: Self-pay | Admitting: Physician Assistant

## 2024-06-27 NOTE — Progress Notes (Signed)
 Plan to discuss results at [facility]

## 2024-07-01 LAB — URINALYSIS, ROUTINE W REFLEX MICROSCOPIC
Bilirubin Urine: NEGATIVE
Glucose, UA: NEGATIVE
Hgb urine dipstick: NEGATIVE
Leukocytes,Ua: NEGATIVE
Nitrite: NEGATIVE
Protein, ur: NEGATIVE
Specific Gravity, Urine: 1.018 (ref 1.001–1.035)
pH: 5.5 (ref 5.0–8.0)

## 2024-07-01 LAB — COMPREHENSIVE METABOLIC PANEL WITH GFR
AG Ratio: 1.9 (calc) (ref 1.0–2.5)
ALT: 24 U/L (ref 6–29)
AST: 26 U/L (ref 10–35)
Albumin: 4.3 g/dL (ref 3.6–5.1)
Alkaline phosphatase (APISO): 67 U/L (ref 37–153)
BUN: 15 mg/dL (ref 7–25)
CO2: 32 mmol/L (ref 20–32)
Calcium: 9.3 mg/dL (ref 8.6–10.4)
Chloride: 99 mmol/L (ref 98–110)
Creat: 0.85 mg/dL (ref 0.50–1.05)
Globulin: 2.3 g/dL (ref 1.9–3.7)
Glucose, Bld: 134 mg/dL — ABNORMAL HIGH (ref 65–99)
Potassium: 4 mmol/L (ref 3.5–5.3)
Sodium: 138 mmol/L (ref 135–146)
Total Bilirubin: 0.9 mg/dL (ref 0.2–1.2)
Total Protein: 6.6 g/dL (ref 6.1–8.1)
eGFR: 77 mL/min/1.73m2

## 2024-07-01 LAB — SYSTEMIC SCLEROSIS (SCLERODERMA) 12 ANTIBODIES PANEL 2
Centromere protein A Ab: <11 SI
Centromere protein B Ab: <11 SI
Fibrillarin Ab: <11 SI
PM-SCL-100 Ab: <11 SI
PM-SCL-75 Ab: <11 SI
RNA polymerase III RP11 Ab: <11 SI
RNA polymerase III RP155 Ab: <11 SI
SCL-70 extractable nuclear Ab: 16 SI — ABNORMAL HIGH
Th-To Ab: <11 SI
U1 small nuclear ribonucleoprotein 70kD Ab: <11 SI
U1 small nuclear ribonucleoprotein A Ab: <11 SI
U1 small nuclear ribonucleoprotein C Ab: <11 SI

## 2024-07-01 LAB — CBC WITH DIFFERENTIAL/PLATELET
Absolute Lymphocytes: 1424 {cells}/uL (ref 850–3900)
Absolute Monocytes: 518 {cells}/uL (ref 200–950)
Basophils Absolute: 73 {cells}/uL (ref 0–200)
Basophils Relative: 1 %
Eosinophils Absolute: 131 {cells}/uL (ref 15–500)
Eosinophils Relative: 1.8 %
HCT: 46.6 % — ABNORMAL HIGH (ref 35.9–46.0)
Hemoglobin: 16.1 g/dL — ABNORMAL HIGH (ref 11.7–15.5)
MCH: 32.5 pg (ref 27.0–33.0)
MCHC: 34.5 g/dL (ref 31.6–35.4)
MCV: 94.1 fL (ref 81.4–101.7)
MPV: 9.6 fL (ref 7.5–12.5)
Monocytes Relative: 7.1 %
Neutro Abs: 5154 {cells}/uL (ref 1500–7800)
Neutrophils Relative %: 70.6 %
Platelets: 268 Thousand/uL (ref 140–400)
RBC: 4.95 Million/uL (ref 3.80–5.10)
RDW: 11.6 % (ref 11.0–15.0)
Total Lymphocyte: 19.5 %
WBC: 7.3 Thousand/uL (ref 3.8–10.8)

## 2024-07-01 LAB — ANA: Anti Nuclear Antibody (ANA): POSITIVE — AB

## 2024-07-01 LAB — C-REACTIVE PROTEIN: CRP: <3 mg/L

## 2024-07-01 LAB — ANTI-NUCLEAR AB-TITER (ANA TITER): ANA Titer 1: 1:40 {titer} — ABNORMAL HIGH

## 2024-07-01 LAB — SEDIMENTATION RATE: Sed Rate: 2 mm/h (ref 0–30)

## 2024-07-01 LAB — CYCLIC CITRUL PEPTIDE ANTIBODY, IGG: Cyclic Citrullin Peptide Ab: <16 U

## 2024-07-01 LAB — MUTATED CITRULLINATED VIMENTIN (MCV) ANTIBODY: MUTATED CITRULLINATED VIMENTIN (MCV) AB: <20 U/mL

## 2024-07-01 LAB — C3 AND C4
C3 Complement: 102 mg/dL (ref 83–193)
C4 Complement: 21 mg/dL (ref 15–57)

## 2024-07-05 ENCOUNTER — Other Ambulatory Visit: Payer: Self-pay | Admitting: Family Medicine

## 2024-07-05 DIAGNOSIS — G2581 Restless legs syndrome: Secondary | ICD-10-CM

## 2024-07-05 NOTE — Telephone Encounter (Signed)
 Please verify if she is taking 1 or 2 tablets and how this is working so I know what dose to refill

## 2024-07-11 NOTE — Telephone Encounter (Signed)
 Patient states that she takes 2 tablets every night at bed time.  There are some nights that she will wake up in the middle of the night and have to take anywhere from 1-2 tablets due to her leg pain.  Sometimes she does not need the extra.

## 2024-07-15 ENCOUNTER — Encounter: Payer: Self-pay | Admitting: *Deleted

## 2024-07-29 ENCOUNTER — Other Ambulatory Visit: Payer: Self-pay | Admitting: *Deleted

## 2024-07-29 DIAGNOSIS — J4530 Mild persistent asthma, uncomplicated: Secondary | ICD-10-CM

## 2024-07-30 NOTE — Progress Notes (Unsigned)
 "  Office Visit Note  Patient: Nancy Thornton             Date of Birth: 07-04-1961           MRN: [id number]             PCP: [name], DO Referring: [name], DO Visit Date: 08/13/2024 Occupation: Data Unavailable  Subjective:  No chief complaint on file.   History of Present Illness: Nancy Thornton is a 64 y.o. female ***     Activities of Daily Living:  Patient reports morning stiffness for *** {minute/hour:19697}.   Patient {ACTIONS;DENIES/REPORTS:21021675::Denies} nocturnal pain.  Difficulty dressing/grooming: {ACTIONS;DENIES/REPORTS:21021675::Denies} Difficulty climbing stairs: {ACTIONS;DENIES/REPORTS:21021675::Denies} Difficulty getting out of chair: {ACTIONS;DENIES/REPORTS:21021675::Denies} Difficulty using hands for taps, buttons, cutlery, and/or writing: {ACTIONS;DENIES/REPORTS:21021675::Denies}  No Rheumatology ROS completed.   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  Treatment:  1.  Patient with 6 months of symptoms to the left knee and 3 months of an increased pain to the posterior lateral aspect  of the knee with recurrent knee effusion and normal x-rays.  Patient has a presumptive diagnosis of the lateral meniscus tear at this time.  We will order MRI scan at this time t   Anal fissure    history of   Asthma    Complex tear of medial meniscus of left knee as current injury 08/29/2017   Last Assessment & Plan:   Formatting of this note might be different from the original.  Treatment:  1.  Patient is scheduled for left knee arthroscopy with medial meniscectomy at this time     Risk benefits alternatives reviewed in detail with the patient all questions were answered and addressed and encouraged.  Patient to select a surgical date.  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    Family History  Problem Relation Age of Onset   Diabetes Mother    Hypertension Father    Colon cancer Neg Hx    Esophageal cancer Neg Hx    Rectal cancer Neg Hx    Stomach cancer Neg Hx    Breast cancer Neg Hx    Past Surgical History:  Procedure Laterality Date   ANTERIOR LUMBAR FUSION N/A 03/06/2019   Procedure: L4-5 XLIF, L4-5 PSF, LEFT SYNOVIAL CYST RESECTION L4-5;  Surgeon: Clois Fret, MD;  Location: [facility];  Service:  Neurosurgery;  Laterality: N/A;   BACK SURGERY     BIOPSY  01/28/2020   Procedure: BIOPSY;  Surgeon: Eartha Angelia Sieving, MD;  Location: [facility];  Service: Gastroenterology;;   CHOLECYSTECTOMY     COLONOSCOPY WITH PROPOFOL  N/A 01/28/2020   Procedure: COLONOSCOPY WITH PROPOFOL ;  Surgeon: Eartha Angelia Sieving, MD;  Location: [facility];  Service: Gastroenterology;  Laterality: N/A;  915   CYSTOSTOMY W/ BLADDER BIOPSY  1990   punctured hole in bladder, had open procedure to repair   ESOPHAGOGASTRODUODENOSCOPY (EGD) WITH PROPOFOL   N/A 06/08/2022   Procedure: ESOPHAGOGASTRODUODENOSCOPY (EGD) WITH PROPOFOL ;  Surgeon: Eartha Angelia Sieving, MD;  Location: [facility];  Service: Gastroenterology;  Laterality: N/A;  2:30PM   MENISCUS REPAIR     TONSILLECTOMY     TOTAL VAGINAL HYSTERECTOMY     64 y/o. bleeding. ovaries left in situ   Social History[1] Social History   Social History Narrative   Not on file     Immunization History  Administered Date(s) Administered   INFLUENZA, HIGH DOSE SEASONAL PF 03/30/2018   Influenza, Quadrivalent, Recombinant, Inj, Pf 03/20/2019, 03/14/2022, 03/25/2024   Influenza, Seasonal, Injecte, Preservative Fre 04/18/2013, 04/12/2014, 03/12/2015, 04/08/2017   Influenza,inj,Quad PF,6+ Mos 04/18/2013, 04/12/2014, 03/12/2015, 04/08/2017, 04/08/2017, 03/30/2018, 02/28/2023   Influenza-Unspecified 04/18/2013, 04/12/2014, 03/12/2015, 03/12/2015, 04/08/2017, 04/08/2017, 03/30/2018, 03/20/2019, 04/25/2020   Moderna Sars-Covid-2 Vaccination 09/20/2019, 10/22/2019, 02/24/2020, 04/25/2020   Pneumococcal Conjugate Pcv21, Polysaccharide Crm197 Conjugaf 03/25/2024   Pneumococcal Conjugate-13 09/05/2013   Pneumococcal Polysaccharide-23 03/12/2015   Respiratory Syncytial Virus Vaccine,Recomb Aduvanted(Arexvy) 03/14/2022   Td 01/09/2017   Tdap 01/09/2017   Zoster Recombinant(Shingrix ) 03/14/2022, 10/10/2022     Objective: Vital Signs: There were no vitals taken for this visit.   Physical Exam   Musculoskeletal Exam: ***  CDAI Exam: CDAI Score: -- Patient Global: --; Provider Global: -- Swollen: --; Tender: -- Joint Exam 08/13/2024   No joint exam has been documented for this visit   There is currently no information documented on the homunculus. Go to the Rheumatology activity and complete the homunculus joint exam.  Investigation: No additional findings.  Imaging: No results found.  Recent Labs: Lab Results  Component Value Date   WBC 7.3 06/24/2024   HGB 16.1 (H)  06/24/2024   PLT 268 06/24/2024   NA 138 06/24/2024   K 4.0 06/24/2024   CL 99 06/24/2024   CO2 32 06/24/2024   GLUCOSE 134 (H) 06/24/2024   BUN 15 06/24/2024   CREATININE 0.85 06/24/2024   BILITOT 0.9 06/24/2024   ALKPHOS 65 10/24/2023   AST 26 06/24/2024   ALT 24 06/24/2024   PROT 6.6 06/24/2024   ALBUMIN 4.3 10/24/2023   CALCIUM  9.3 06/24/2024   GFRAA 104 06/29/2020    Speciality Comments: No specialty comments available.  Procedures:  No procedures performed Allergies: Penicillins, Meloxicam, and Bactrim [sulfamethoxazole-trimethoprim]   Assessment / Plan:     Visit Diagnoses: Positive ANA (antinuclear antibody)  Scl-70 antibody positive  S/P lumbar fusion  History of arthroscopy of knee  Coronary artery disease involving native coronary artery of native heart without angina pectoris  Hypertension associated with diabetes (HCC)  Fatty liver  Type 2 diabetes mellitus with hyperglycemia, without long-term current use of insulin  (HCC)  Hyperlipidemia associated with type 2 diabetes mellitus (HCC)  Tobacco use  History of COPD  Orders: No orders of the defined types were placed in this encounter.  No orders of the defined types were placed in this encounter.   Face-to-face time  spent with patient was *** minutes. Greater than 50% of time was spent in counseling and coordination of care.  Follow-Up Instructions: No follow-ups on file.   Waddell CHRISTELLA Craze, PA-C  Note - This record has been created using Dragon software.  Chart creation errors have been sought, but may not always  have been located. Such creation errors do not reflect on  the standard of medical care.     [1]  Social History Tobacco Use   Smoking status: Every Day    Current packs/day: 1.00    Average packs/day: 1 pack/day for 44.8 years (44.8 ttl pk-yrs)    Types: Cigarettes    Start date: 10/19/1979   Smokeless tobacco: Never  Vaping Use   Vaping status: Some Days  Substance  Use Topics   Alcohol use: No   Drug use: No   "

## 2024-07-31 MED ORDER — ALBUTEROL SULFATE HFA 108 (90 BASE) MCG/ACT IN AERS: INHALATION_SPRAY | RESPIRATORY_TRACT | 0 refills | Status: AC

## 2024-08-09 ENCOUNTER — Telehealth: Payer: Self-pay

## 2024-08-09 NOTE — Telephone Encounter (Signed)
 Patient called office to cancel appointment for 08/13/2024 and would like to reschedule. Please advise

## 2024-08-13 ENCOUNTER — Ambulatory Visit: Admitting: Physician Assistant

## 2024-08-13 DIAGNOSIS — E1165 Type 2 diabetes mellitus with hyperglycemia: Secondary | ICD-10-CM

## 2024-08-13 DIAGNOSIS — Z720 Tobacco use: Secondary | ICD-10-CM

## 2024-08-13 DIAGNOSIS — Z981 Arthrodesis status: Secondary | ICD-10-CM

## 2024-08-13 DIAGNOSIS — K760 Fatty (change of) liver, not elsewhere classified: Secondary | ICD-10-CM

## 2024-08-13 DIAGNOSIS — Z9889 Other specified postprocedural states: Secondary | ICD-10-CM

## 2024-08-13 DIAGNOSIS — I152 Hypertension secondary to endocrine disorders: Secondary | ICD-10-CM

## 2024-08-13 DIAGNOSIS — Z8709 Personal history of other diseases of the respiratory system: Secondary | ICD-10-CM

## 2024-08-13 DIAGNOSIS — E1169 Type 2 diabetes mellitus with other specified complication: Secondary | ICD-10-CM

## 2024-08-13 DIAGNOSIS — R7689 Other specified abnormal immunological findings in serum: Secondary | ICD-10-CM

## 2024-08-13 DIAGNOSIS — I2510 Atherosclerotic heart disease of native coronary artery without angina pectoris: Secondary | ICD-10-CM

## 2024-08-13 DIAGNOSIS — M19041 Primary osteoarthritis, right hand: Secondary | ICD-10-CM

## 2024-09-13 ENCOUNTER — Ambulatory Visit: Admitting: Internal Medicine

## 2024-10-02 ENCOUNTER — Ambulatory Visit: Admitting: Physician Assistant

## 2024-11-15 ENCOUNTER — Encounter: Payer: Self-pay | Admitting: Family Medicine

## 2025-02-05 ENCOUNTER — Ambulatory Visit: Payer: Self-pay
